# Patient Record
Sex: Male | Born: 1945 | Race: Black or African American | Hispanic: No | Smoking: Former smoker
Health system: Southern US, Community
[De-identification: ages and names within clinical notes are randomized; demographics above are authoritative.]

## PROBLEM LIST (undated history)

## (undated) DIAGNOSIS — E119 Type 2 diabetes mellitus without complications: Secondary | ICD-10-CM

## (undated) DIAGNOSIS — N289 Disorder of kidney and ureter, unspecified: Secondary | ICD-10-CM

## (undated) DIAGNOSIS — N186 End stage renal disease: Secondary | ICD-10-CM

## (undated) DIAGNOSIS — E559 Vitamin D deficiency, unspecified: Secondary | ICD-10-CM

## (undated) DIAGNOSIS — N529 Male erectile dysfunction, unspecified: Secondary | ICD-10-CM

## (undated) DIAGNOSIS — I10 Essential (primary) hypertension: Secondary | ICD-10-CM

## (undated) HISTORY — DX: Vitamin D deficiency, unspecified: E55.9

## (undated) HISTORY — PX: OTHER SURGICAL HISTORY: SHX169

## (undated) HISTORY — DX: Male erectile dysfunction, unspecified: N52.9

## (undated) HISTORY — PX: COLONOSCOPY: SHX174

## (undated) HISTORY — DX: End stage renal disease: N18.6

---

## 1998-02-02 ENCOUNTER — Other Ambulatory Visit: Admission: RE | Admit: 1998-02-02 | Discharge: 1998-02-02 | Payer: Self-pay | Admitting: Nephrology

## 1998-04-22 ENCOUNTER — Other Ambulatory Visit: Admission: RE | Admit: 1998-04-22 | Discharge: 1998-04-22 | Payer: Self-pay | Admitting: Nephrology

## 1998-08-28 ENCOUNTER — Emergency Department (HOSPITAL_COMMUNITY): Admission: EM | Admit: 1998-08-28 | Discharge: 1998-08-28 | Payer: Self-pay | Admitting: Emergency Medicine

## 1999-01-31 ENCOUNTER — Encounter: Payer: Self-pay | Admitting: *Deleted

## 1999-01-31 ENCOUNTER — Emergency Department (HOSPITAL_COMMUNITY): Admission: EM | Admit: 1999-01-31 | Discharge: 1999-01-31 | Payer: Self-pay | Admitting: *Deleted

## 1999-06-07 ENCOUNTER — Other Ambulatory Visit (HOSPITAL_COMMUNITY): Admission: RE | Admit: 1999-06-07 | Discharge: 1999-09-05 | Payer: Self-pay | Admitting: Psychiatry

## 2000-03-09 ENCOUNTER — Encounter: Admission: RE | Admit: 2000-03-09 | Discharge: 2000-03-09 | Payer: Self-pay | Admitting: Nephrology

## 2000-03-09 ENCOUNTER — Encounter: Payer: Self-pay | Admitting: Nephrology

## 2010-06-16 DIAGNOSIS — K0520 Aggressive periodontitis, unspecified: Secondary | ICD-10-CM | POA: Insufficient documentation

## 2011-08-30 DIAGNOSIS — B182 Chronic viral hepatitis C: Secondary | ICD-10-CM | POA: Insufficient documentation

## 2013-11-12 DIAGNOSIS — G629 Polyneuropathy, unspecified: Secondary | ICD-10-CM | POA: Insufficient documentation

## 2015-12-09 DIAGNOSIS — E113299 Type 2 diabetes mellitus with mild nonproliferative diabetic retinopathy without macular edema, unspecified eye: Secondary | ICD-10-CM | POA: Insufficient documentation

## 2016-02-10 DIAGNOSIS — N138 Other obstructive and reflux uropathy: Secondary | ICD-10-CM | POA: Insufficient documentation

## 2016-02-23 DIAGNOSIS — E119 Type 2 diabetes mellitus without complications: Secondary | ICD-10-CM

## 2016-02-23 DIAGNOSIS — I10 Essential (primary) hypertension: Secondary | ICD-10-CM | POA: Insufficient documentation

## 2016-02-23 DIAGNOSIS — R0789 Other chest pain: Secondary | ICD-10-CM | POA: Insufficient documentation

## 2016-02-24 DIAGNOSIS — J309 Allergic rhinitis, unspecified: Secondary | ICD-10-CM | POA: Insufficient documentation

## 2016-02-24 DIAGNOSIS — I119 Hypertensive heart disease without heart failure: Secondary | ICD-10-CM | POA: Insufficient documentation

## 2016-03-04 DIAGNOSIS — N5203 Combined arterial insufficiency and corporo-venous occlusive erectile dysfunction: Secondary | ICD-10-CM | POA: Insufficient documentation

## 2016-03-21 DIAGNOSIS — N37 Urethral disorders in diseases classified elsewhere: Secondary | ICD-10-CM | POA: Insufficient documentation

## 2016-07-07 DIAGNOSIS — E1122 Type 2 diabetes mellitus with diabetic chronic kidney disease: Secondary | ICD-10-CM | POA: Insufficient documentation

## 2016-07-07 DIAGNOSIS — Z91199 Patient's noncompliance with other medical treatment and regimen due to unspecified reason: Secondary | ICD-10-CM | POA: Insufficient documentation

## 2016-07-26 DIAGNOSIS — B009 Herpesviral infection, unspecified: Secondary | ICD-10-CM | POA: Insufficient documentation

## 2016-07-26 DIAGNOSIS — F1421 Cocaine dependence, in remission: Secondary | ICD-10-CM | POA: Insufficient documentation

## 2017-03-03 DIAGNOSIS — H5203 Hypermetropia, bilateral: Secondary | ICD-10-CM | POA: Insufficient documentation

## 2017-03-03 DIAGNOSIS — H26492 Other secondary cataract, left eye: Secondary | ICD-10-CM | POA: Insufficient documentation

## 2017-07-03 ENCOUNTER — Emergency Department (HOSPITAL_COMMUNITY)
Admission: EM | Admit: 2017-07-03 | Discharge: 2017-07-03 | Disposition: A | Discharge status: Home / Self Care | Payer: Medicare HMO | Attending: Emergency Medicine | Admitting: Emergency Medicine

## 2017-07-03 ENCOUNTER — Emergency Department (HOSPITAL_COMMUNITY): Payer: Medicare HMO

## 2017-07-03 ENCOUNTER — Telehealth: Payer: Self-pay | Admitting: Nurse Practitioner

## 2017-07-03 ENCOUNTER — Encounter (HOSPITAL_COMMUNITY): Payer: Self-pay | Admitting: Emergency Medicine

## 2017-07-03 DIAGNOSIS — R079 Chest pain, unspecified: Secondary | ICD-10-CM | POA: Insufficient documentation

## 2017-07-03 DIAGNOSIS — Z794 Long term (current) use of insulin: Secondary | ICD-10-CM | POA: Diagnosis not present

## 2017-07-03 DIAGNOSIS — R0789 Other chest pain: Secondary | ICD-10-CM | POA: Diagnosis not present

## 2017-07-03 DIAGNOSIS — I10 Essential (primary) hypertension: Secondary | ICD-10-CM | POA: Diagnosis not present

## 2017-07-03 DIAGNOSIS — Z79899 Other long term (current) drug therapy: Secondary | ICD-10-CM | POA: Insufficient documentation

## 2017-07-03 DIAGNOSIS — E119 Type 2 diabetes mellitus without complications: Secondary | ICD-10-CM | POA: Insufficient documentation

## 2017-07-03 DIAGNOSIS — Z7982 Long term (current) use of aspirin: Secondary | ICD-10-CM | POA: Insufficient documentation

## 2017-07-03 HISTORY — DX: Essential (primary) hypertension: I10

## 2017-07-03 HISTORY — DX: Type 2 diabetes mellitus without complications: E11.9

## 2017-07-03 LAB — CBC
HCT: 32.7 % — ABNORMAL LOW (ref 39.0–52.0)
HEMOGLOBIN: 11.3 g/dL — AB (ref 13.0–17.0)
MCH: 29.4 pg (ref 26.0–34.0)
MCHC: 34.6 g/dL (ref 30.0–36.0)
MCV: 85.2 fL (ref 78.0–100.0)
Platelets: 199 10*3/uL (ref 150–400)
RBC: 3.84 MIL/uL — ABNORMAL LOW (ref 4.22–5.81)
RDW: 13.5 % (ref 11.5–15.5)
WBC: 6.7 10*3/uL (ref 4.0–10.5)

## 2017-07-03 LAB — BASIC METABOLIC PANEL
ANION GAP: 8 (ref 5–15)
BUN: 41 mg/dL — AB (ref 6–20)
CHLORIDE: 108 mmol/L (ref 101–111)
CO2: 25 mmol/L (ref 22–32)
Calcium: 9 mg/dL (ref 8.9–10.3)
Creatinine, Ser: 2.13 mg/dL — ABNORMAL HIGH (ref 0.61–1.24)
GFR calc Af Amer: 35 mL/min — ABNORMAL LOW (ref >60)
GFR, EST NON AFRICAN AMERICAN: 30 mL/min — AB (ref >60)
GLUCOSE: 118 mg/dL — AB (ref 65–99)
POTASSIUM: 4.2 mmol/L (ref 3.5–5.1)
Sodium: 141 mmol/L (ref 135–145)

## 2017-07-03 LAB — I-STAT TROPONIN, ED
Troponin i, poc: 0.01 ng/mL (ref 0.00–0.08)
Troponin i, poc: 0 ng/mL (ref 0.00–0.08)

## 2017-07-03 LAB — D-DIMER, QUANTITATIVE: D-Dimer, Quant: 0.38 ug/mL-FEU (ref 0.00–0.50)

## 2017-07-03 LAB — CBG MONITORING, ED
GLUCOSE-CAPILLARY: 59 mg/dL — AB (ref 65–99)
GLUCOSE-CAPILLARY: 108 mg/dL — AB (ref 65–99)
Glucose-Capillary: 117 mg/dL — ABNORMAL HIGH (ref 65–99)

## 2017-07-03 IMAGING — CR DG CHEST 2V
2 series · 2 of 2 positions shown · non-contrast
Comparison: [DATE]

CLINICAL DATA: Chest pain

EXAM:
CHEST  2 VIEW

[chest pa]
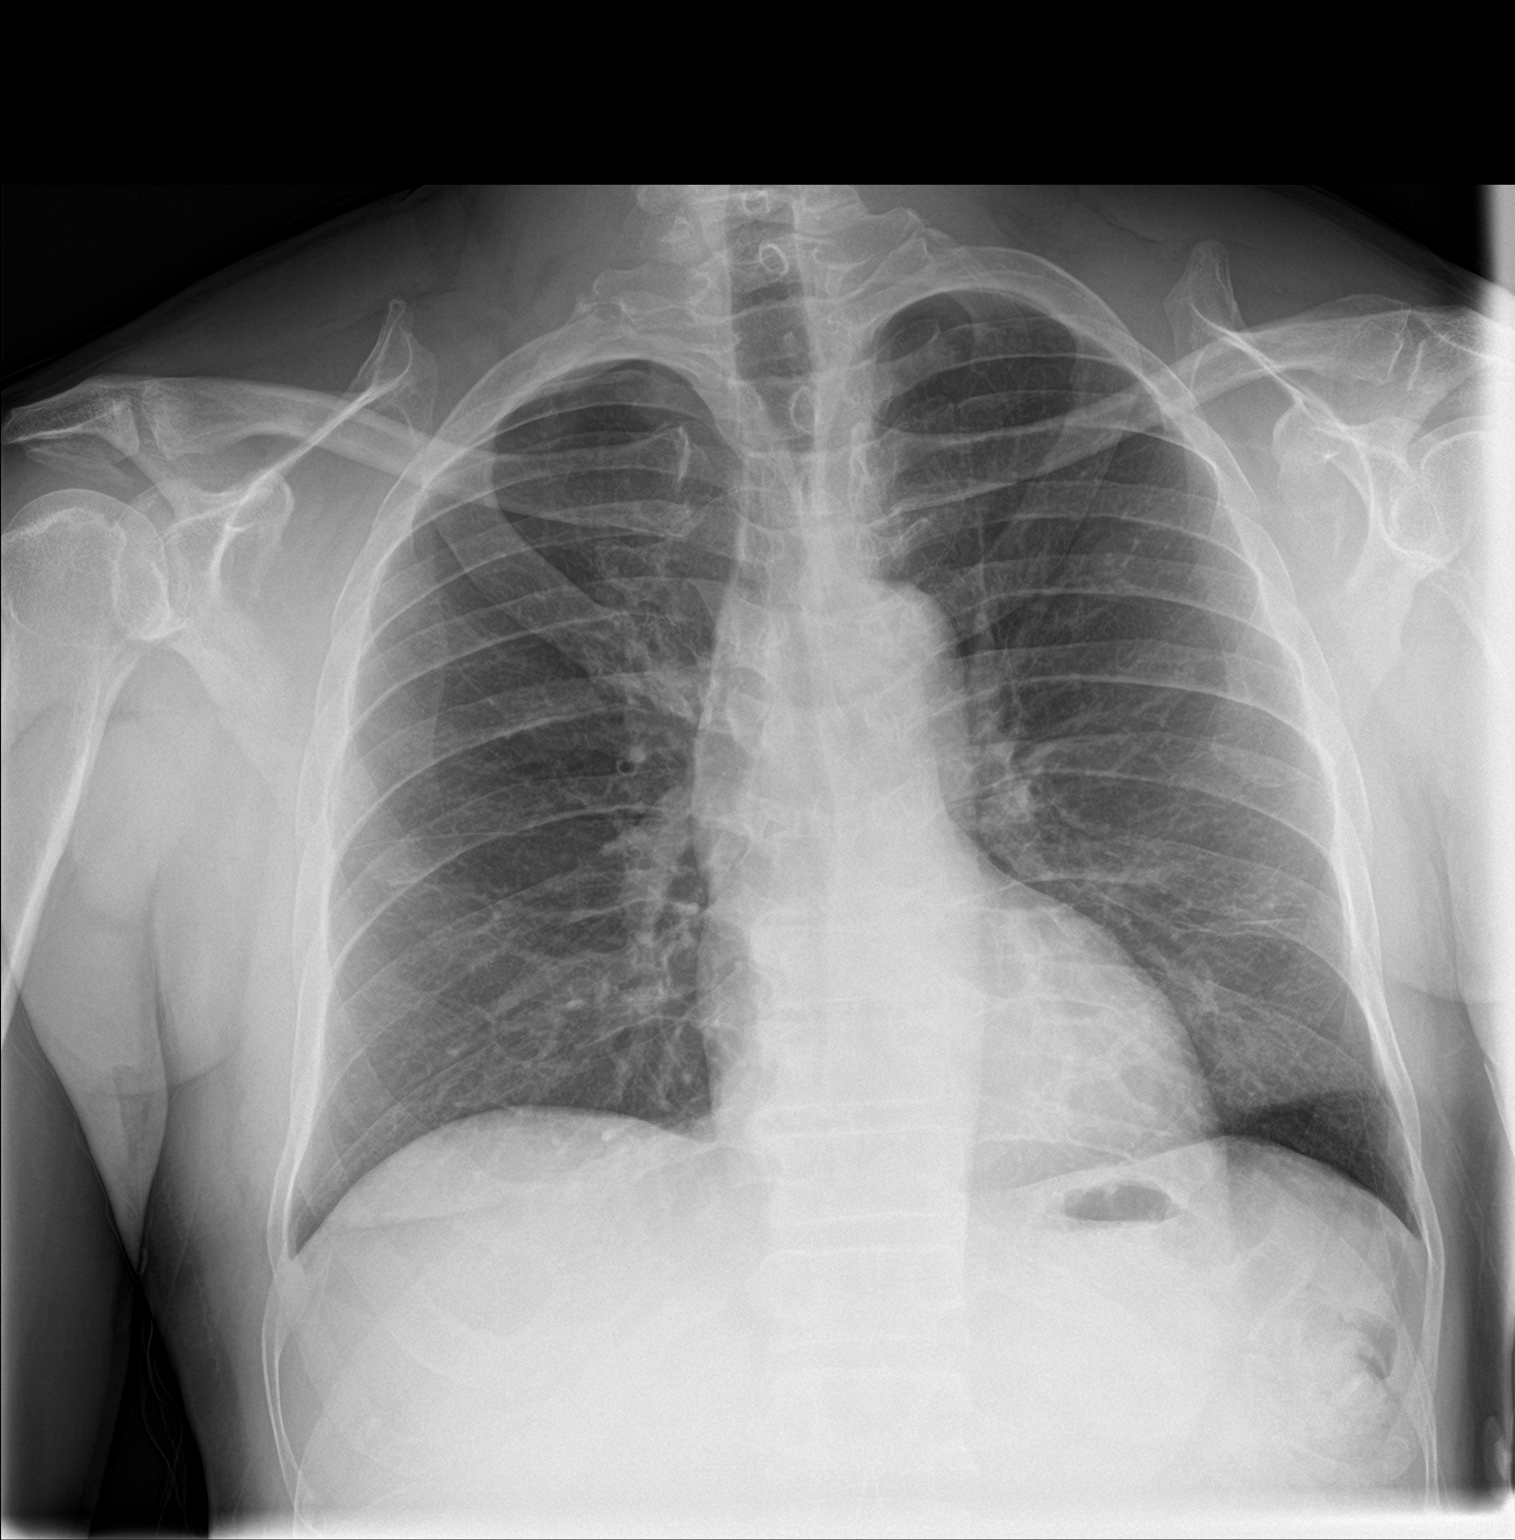

[chest lat]
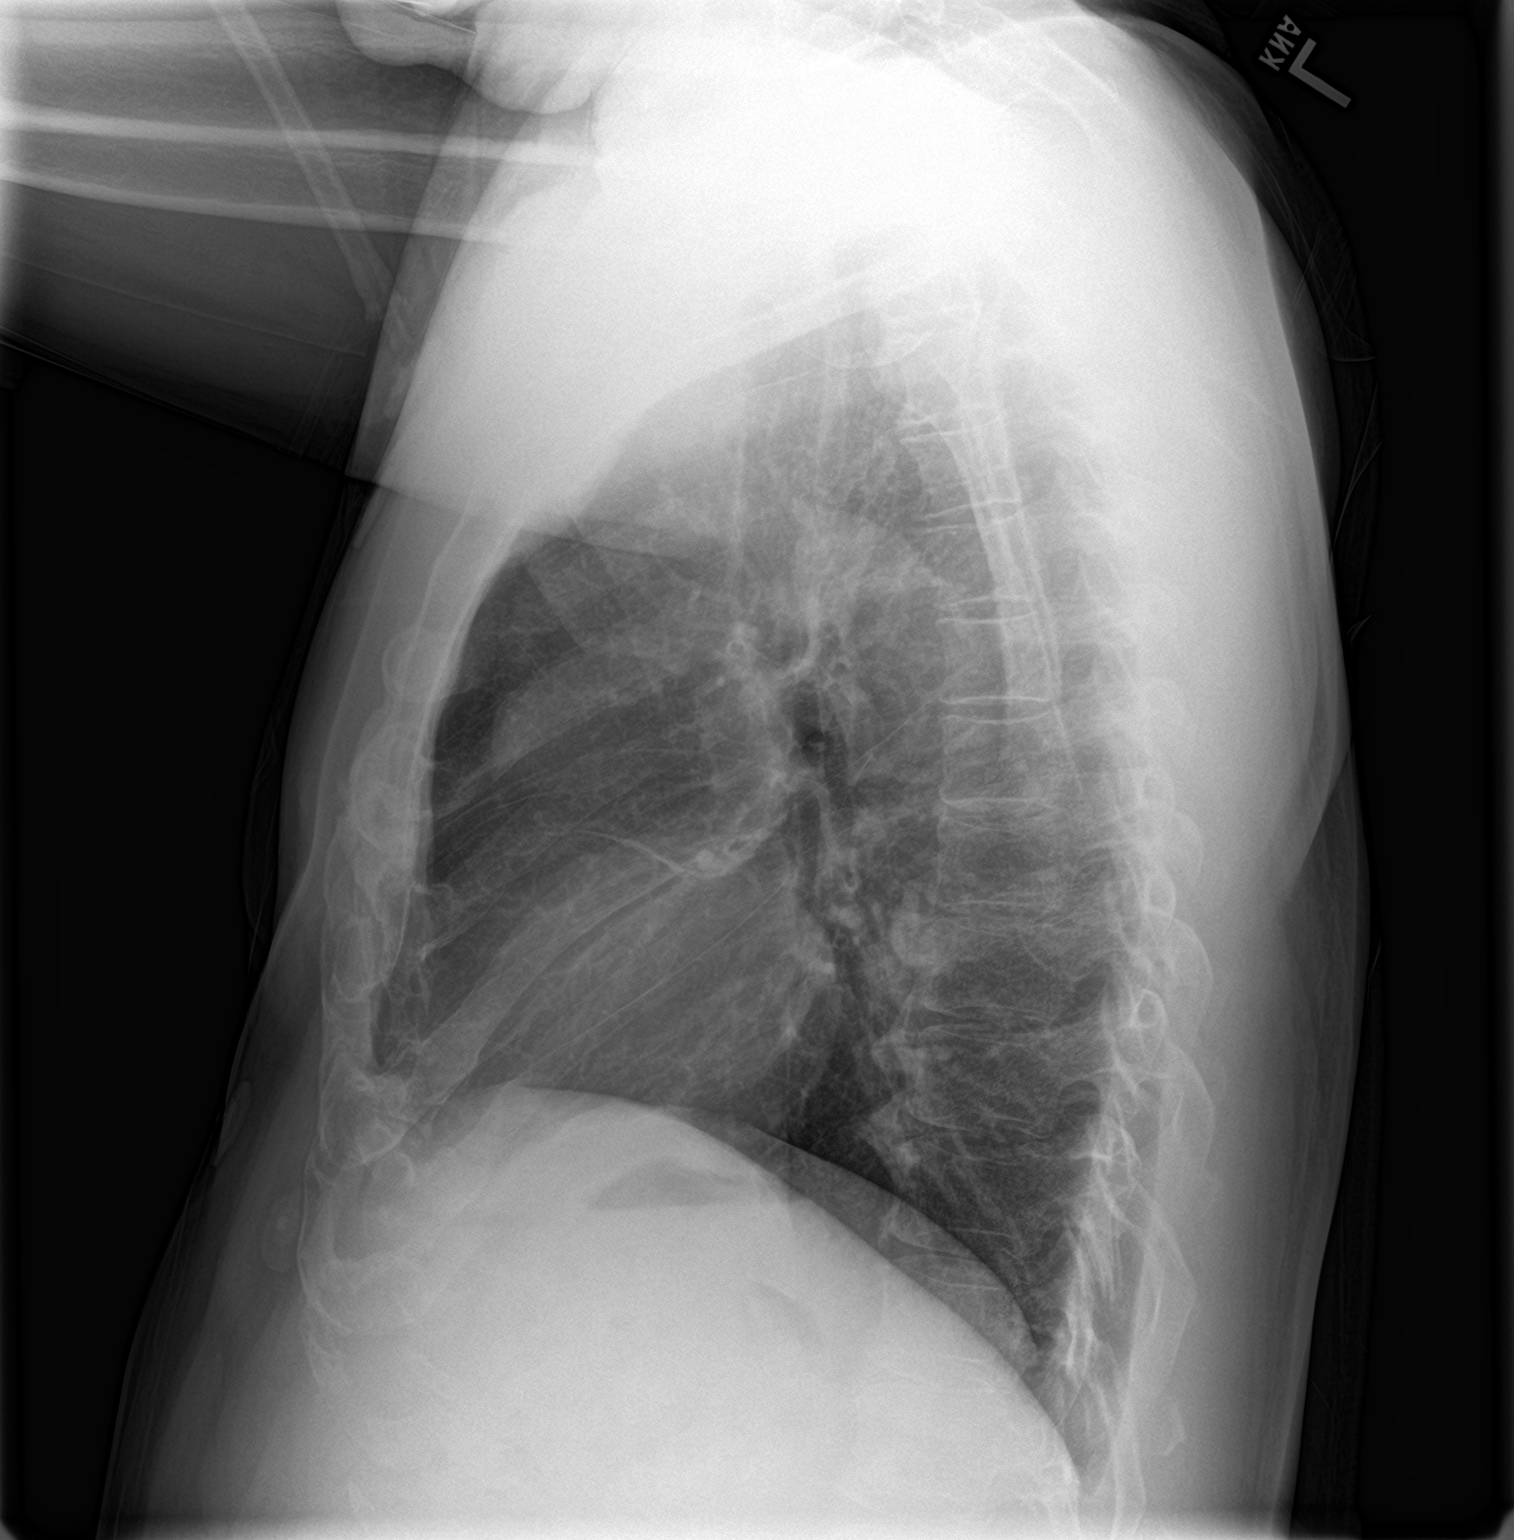

[2 of 2 positions shown; findings below may reference images not displayed]

FINDINGS: Heart and mediastinal contours are within normal limits. No focal
opacities or effusions. No acute bony abnormality.
IMPRESSION: No active cardiopulmonary disease.

## 2017-07-03 MED ORDER — ASPIRIN 81 MG PO CHEW
324.0000 mg | CHEWABLE_TABLET | Freq: Once | ORAL | Status: AC
  Administered 2017-07-03: 324 mg via ORAL
  Filled 2017-07-03: qty 4

## 2017-07-03 MED ORDER — NITROGLYCERIN 0.4 MG SL SUBL: 0.4000 mg | SUBLINGUAL_TABLET | SUBLINGUAL | 0 refills | Status: DC | PRN

## 2017-07-03 MED ORDER — NITROGLYCERIN 2 % TD OINT
1.0000 [in_us] | TOPICAL_OINTMENT | Freq: Four times a day (QID) | TRANSDERMAL | Status: DC
  Administered 2017-07-03: 1 [in_us] via TOPICAL
  Filled 2017-07-03: qty 1

## 2017-07-03 NOTE — ED Triage Notes (Signed)
Pt woke up to a "pulling or catching" on the left side of his chest.  He has never had this before, denied n/v/d/diaphoresis, lightheaded.

## 2017-07-03 NOTE — Telephone Encounter (Signed)
Order placed for lexiscan myoview and note sent to scheduling to arrange test date/time for patient.

## 2017-07-03 NOTE — ED Notes (Signed)
Got patient hooked up to the monitor patient is resting with call bell in reach

## 2017-07-03 NOTE — Consult Note (Addendum)
Cardiology Consultation:   Patient ID: Jonathan Kane; [id number]; [birth year]   Admit date: 07/03/2017 Date of Consult: 07/03/2017  Primary Care Provider: Myrtis Kane., MD Primary Cardiologist: new  Primary Electrophysiologist:  None    Patient Profile:   Jonathan Kane is a 71 y.o. male with a hx of Essential hypertension, CKD  and diabetes mellitus. who is being seen today for the evaluation of atypical chest pain at the request of Jonathan Kane.  History of Present Illness:   Jonathan Kane has not had any previous CP This am, around 1 am, Aching like sharp pain , like a toothache. Would last 5-8 seconds . Woke him up . Would come and go. Not pleuretic, not exertional .   Gets regular exercise in his yard.  No similar pains with yard work . DM is not well controlled.   [place]'s dr. Yvette [facility] [place] . Pain has resolved.  troponins are negative  D-dimer is negative   Non smoker  Family hx - mother had ovarian cancer, Sister has diabetes  Past Medical History:  Diagnosis Date  . Diabetes mellitus without complication ([place])   . Hypertension     History reviewed. No pertinent surgical history.   Home Medications:  Prior to Admission medications   Medication Sig Start Date End Date Taking? Authorizing Provider  ACCU-CHEK AVIVA PLUS test strip 1 each by Other route as needed.  04/06/17  Yes [provider]  ACCU-CHEK SOFTCLIX LANCETS lancets CHECK BLOOD SUGAR TWO TO THREE TIMES DAILY 04/05/17  Yes [provider]  Alcohol Swabs (B-D SINGLE USE SWABS REGULAR) PADS USE TO CHECK BLOOD SUGAR AS DIRECTED 01/26/17  Yes [provider]  amLODipine (NORVASC) 10 MG tablet Take 10 mg by mouth daily.    Yes [provider]  aspirin EC 81 MG tablet Take 81 mg by mouth daily.   Yes [provider]  atorvastatin (LIPITOR) 40 MG tablet Take 40 mg by mouth daily at 6 PM.  05/09/17  Yes [provider]  B Complex Vitamins (VITAMIN B  COMPLEX PO) Take 1 capsule by mouth daily.    Yes [provider]  Blood Glucose Monitoring Suppl (GLUCOCOM BLOOD GLUCOSE MONITOR) DEVI Use as directed.  Dx Code: U31.497 10/26/16  Yes [provider]  Exenatide ER (BYDUREON) 2 MG PEN Inject 2 mg as directed once a week. Wednesday 11/11/16  Yes [provider]  FERREX 150 150 MG capsule Take 150 mg by mouth daily.  05/25/17  Yes [provider]  ibuprofen (ADVIL,MOTRIN) 200 MG tablet Take 200 mg by mouth daily as needed for mild pain.   Yes [provider]  insulin aspart (NOVOLOG) 100 UNIT/ML FlexPen 4-12 units per sliding scale for lunch and supper. 09/13/16  Yes [provider]  insulin aspart protamine - aspart (NOVOLOG 70/30 MIX) (70-30) 100 UNIT/ML FlexPen Inject 25-35 Units into the skin 2 (two) times daily. 25UNITS IN THE MORNING AND 35UNITS AT BEDTIME 04/28/17  Yes [provider]  Insulin Pen Needle (FIFTY50 PEN NEEDLES) 31G X 8 MM MISC Use as directed per sliding scale.  Dx Code: W26.378 02/03/16  Yes [provider]  lisinopril (PRINIVIL,ZESTRIL) 5 MG tablet Take 5 mg by mouth daily.  04/06/17  Yes [provider]  pioglitazone (ACTOS) 30 MG tablet Take 30 mg by mouth daily.    Yes [provider]  PREVNAR 13 SUSP injection Inject 0.5 mLs into the muscle once.  04/28/17  Yes [provider]    Inpatient Medications: Scheduled Meds: . nitroGLYCERIN  1 inch Topical Q6H   Continuous Infusions:  PRN Meds:   Allergies:    Allergies  Allergen Reactions  . Dulaglutide     TRULICITY:  GI Upset intolerance    Social History:   Social History   Social History  . Marital status: Divorced    Spouse name: N/A  . Number of children: N/A  . Years of education: N/A   Occupational History  . Not on file.   Social History Main Topics  . Smoking status: Never Smoker  . Smokeless tobacco: Never Used  . Alcohol use Yes     Comment: 6pack can  last me 2-3 weeks  . Drug use: No  . Sexual activity: Not on file   Other Topics Concern  . Not on file   Social History Narrative  . No narrative on file    Family History:   Family History  Problem Relation Age of Onset  . Ovarian cancer Mother   . Diabetes Sister      ROS:  Please see the history of present illness.  ROS  All other ROS reviewed and negative.     Physical Exam/Data:   Vitals:   07/03/17 0700 07/03/17 0701 07/03/17 1000  BP: 123/83  (!) 163/93  Pulse: 80  70  Resp: 18  15  Temp: 97.8 F (36.6 C)    TempSrc: Oral    SpO2: 100%  100%  Weight:  178 lb (80.7 kg)   Height:  5\' 10"  (1.778 m)    No intake or output data in the 24 hours ending 07/03/17 1133 Filed Weights   07/03/17 0701  Weight: 178 lb (80.7 kg)   Body mass index is 25.54 kg/m.  General:  Well nourished, well developed, in no acute distress HEENT: normal Lymph: no adenopathy Neck: no JVD Endocrine:  No thryomegaly Vascular: No carotid bruits; FA pulses 2+ bilaterally without bruits  Cardiac:  normal S1, S2; RRR; no murmur  Lungs:  clear to auscultation bilaterally, no wheezing, rhonchi or rales  Abd: soft, nontender, no hepatomegaly  Ext: no edema Musculoskeletal:  No deformities, BUE and BLE strength normal and equal Skin: warm and dry  Neuro:  CNs 2-12 intact, no focal abnormalities noted Psych:  Normal affect   EKG:  The EKG was personally reviewed and demonstrates:   NSR at 86.  No ST or T wave change  Telemetry:  Telemetry was personally reviewed and demonstrates:  nsr  Relevant CV Studies:   Laboratory Data:  Chemistry  Recent Labs Lab 07/03/17 0723  NA 141  K 4.2  CL 108  CO2 25  GLUCOSE 118*  BUN 41*  CREATININE 2.13*  CALCIUM 9.0  GFRNONAA 30*  GFRAA 35*  ANIONGAP 8    No results for input(s): PROT, ALBUMIN, AST, ALT, ALKPHOS, BILITOT in the last 168 hours. Hematology  Recent Labs Lab 07/03/17 0723  WBC 6.7  RBC 3.84*  HGB 11.3*  HCT 32.7*    MCV 85.2  MCH 29.4  MCHC 34.6  RDW 13.5  PLT 199   Cardiac EnzymesNo results for input(s): TROPONINI in the last 168 hours.   Recent Labs Lab 07/03/17 0720 07/03/17 1009  TROPIPOC 0.01 0.00    BNPNo results for input(s): BNP, PROBNP in the last 168 hours.  DDimer   Recent Labs Lab 07/03/17 0951  DDIMER 0.38    Radiology/Studies:  Dg Chest 2 View  Result Date: 07/03/2017 CLINICAL DATA:  Chest pain EXAM: CHEST  2 VIEW COMPARISON:  02/23/2016 FINDINGS: Heart and mediastinal contours are within normal limits. No focal opacities or effusions. No acute bony abnormality. IMPRESSION: No active cardiopulmonary disease. Electronically Signed   By: Rolm Baptise M.D.   On: 07/03/2017 07:22    Assessment and Plan:   1. CP , atypical.   rec an ASA 81 a day ( hes on this already)  \ Pains are very atypical,  Lasting only for few seconds  D-dimer is WNL   Work up as OP including stress myoview   Further evaluation depending on the OP myoview \\2 .  Essential HTN contiinue amlodipine and lisinopril     For questions or updates, please contact [place] Please consult [email or web address] for contact info under Cardiology/STEMI.   Signed, Mertie Moores, MD  07/03/2017 11:33 AM

## 2017-07-03 NOTE — Discharge Instructions (Signed)
Take an asa daily.  The NTG prescriptions is to be used if you have any recurrent chest pain.  Return to the ED as needed for worsening symptoms

## 2017-07-03 NOTE — ED Provider Notes (Signed)
[facility] DEPT Provider Note   CSN: [id number] Arrival date & time: 07/03/17  8099     History   Chief Complaint Chief Complaint  Patient presents with  . Chest Pain    HPI Jonathan Kane is a 71 y.o. male.  HPI Pt woke up this am with a pulling sensation on the left chest.  Sx started a little after 1 am.  It lasted for 5 seconds or so.  It will come and go.  Every 10 minutes it come back.  He continues to feel the sx.  He tries to exercise or take a deep breath but then it will come back suddenly.   No nausea, vomiting, shortness of breath.  Pt has DM and HTN.  No known heart disease.  No prior cardiac workup. Past Medical History:  Diagnosis Date  . Diabetes mellitus without complication ([place])   . Hypertension     There are no active problems to display for this patient.   History reviewed. No pertinent surgical history.     Home Medications    Prior to Admission medications   Medication Sig Start Date End Date Taking? Authorizing Provider  ACCU-CHEK AVIVA PLUS test strip 1 each by Other route as needed.  04/06/17  Yes [provider]  ACCU-CHEK SOFTCLIX LANCETS lancets CHECK BLOOD SUGAR TWO TO THREE TIMES DAILY 04/05/17  Yes [provider]  Alcohol Swabs (B-D SINGLE USE SWABS REGULAR) PADS USE TO CHECK BLOOD SUGAR AS DIRECTED 01/26/17  Yes [provider]  amLODipine (NORVASC) 10 MG tablet Take 10 mg by mouth daily.    Yes [provider]  aspirin EC 81 MG tablet Take 81 mg by mouth daily.   Yes [provider]  atorvastatin (LIPITOR) 40 MG tablet Take 40 mg by mouth daily at 6 PM.  05/09/17  Yes [provider]  B Complex Vitamins (VITAMIN B COMPLEX PO) Take 1 capsule by mouth daily.    Yes [provider]  Blood Glucose Monitoring Suppl (GLUCOCOM BLOOD GLUCOSE MONITOR) DEVI Use as directed.  Dx Code: I33.825 10/26/16  Yes [provider]  Exenatide ER (BYDUREON) 2 MG PEN Inject 2 mg as  directed once a week. Wednesday 11/11/16  Yes [provider]  FERREX 150 150 MG capsule Take 150 mg by mouth daily.  05/25/17  Yes [provider]  ibuprofen (ADVIL,MOTRIN) 200 MG tablet Take 200 mg by mouth daily as needed for mild pain.   Yes [provider]  insulin aspart (NOVOLOG) 100 UNIT/ML FlexPen 4-12 units per sliding scale for lunch and supper. 09/13/16  Yes [provider]  insulin aspart protamine - aspart (NOVOLOG 70/30 MIX) (70-30) 100 UNIT/ML FlexPen Inject 25-35 Units into the skin 2 (two) times daily. 25UNITS IN THE MORNING AND 35UNITS AT BEDTIME 04/28/17  Yes [provider]  Insulin Pen Needle (FIFTY50 PEN NEEDLES) 31G X 8 MM MISC Use as directed per sliding scale.  Dx Code: K53.976 02/03/16  Yes [provider]  lisinopril (PRINIVIL,ZESTRIL) 5 MG tablet Take 5 mg by mouth daily.  04/06/17  Yes [provider]  pioglitazone (ACTOS) 30 MG tablet Take 30 mg by mouth daily.    Yes [provider]  PREVNAR 13 SUSP injection Inject 0.5 mLs into the muscle once.  04/28/17  Yes [provider]  nitroGLYCERIN (NITROSTAT) 0.4 MG SL tablet Place 1 tablet (0.4 mg total) under the tongue every 5 (five) minutes as needed for chest pain. 07/03/17  Dorie Rank, MD    Family History Family History  Problem Relation Age of Onset  . Ovarian cancer Mother   . Diabetes Sister     Social History Social History  Substance Use Topics  . Smoking status: Never Smoker  . Smokeless tobacco: Never Used  . Alcohol use Yes     Comment: 6pack can last me 2-3 weeks     Allergies   Dulaglutide   Review of Systems Review of Systems  All other systems reviewed and are negative.    Physical Exam Updated Vital Signs BP (!) 152/105 (BP Location: Right Arm)   Pulse 76   Temp 97.8 F (36.6 C) (Oral)   Resp 20   Ht 1.778 m (5\' 10" )   Wt 80.7 kg (178 lb)   SpO2 100%   BMI 25.54 kg/m   Physical Exam    Constitutional: He appears well-developed and well-nourished. No distress.  HENT:  Head: Normocephalic and atraumatic.  Right Ear: External ear normal.  Left Ear: External ear normal.  Eyes: Conjunctivae are normal. Right eye exhibits no discharge. Left eye exhibits no discharge. No scleral icterus.  Neck: Neck supple. No tracheal deviation present.  Cardiovascular: Normal rate, regular rhythm and intact distal pulses.   Pulmonary/Chest: Effort normal and breath sounds normal. No stridor. No respiratory distress. He has no wheezes. He has no rales.  Abdominal: Soft. Bowel sounds are normal. He exhibits no distension. There is no tenderness. There is no rebound and no guarding.  Musculoskeletal: He exhibits no edema or tenderness.  Neurological: He is alert. He has normal strength. No cranial nerve deficit (no facial droop, extraocular movements intact, no slurred speech) or sensory deficit. He exhibits normal muscle tone. He displays no seizure activity. Coordination normal.  Skin: Skin is warm and dry. No rash noted.  Psychiatric: He has a normal mood and affect.  Nursing note and vitals reviewed.    ED Treatments / Results  Labs (all labs ordered are listed, but only abnormal results are displayed) Labs Reviewed  BASIC METABOLIC PANEL - Abnormal; Notable for the following:       Result Value   Glucose, Bld 118 (*)    BUN 41 (*)    Creatinine, Ser 2.13 (*)    GFR calc non Af Amer 30 (*)    GFR calc Af Amer 35 (*)    All other components within normal limits  CBC - Abnormal; Notable for the following:    RBC 3.84 (*)    Hemoglobin 11.3 (*)    HCT 32.7 (*)    All other components within normal limits  CBG MONITORING, ED - Abnormal; Notable for the following:    Glucose-Capillary 117 (*)    All other components within normal limits  CBG MONITORING, ED - Abnormal; Notable for the following:    Glucose-Capillary 59 (*)    All other components within normal limits  D-DIMER,  QUANTITATIVE (NOT AT [facility])  I-STAT TROPONIN, ED  I-STAT TROPONIN, ED    EKG  EKG Interpretation  Date/Time:  Monday July 03 2017 06:38:29 EDT Ventricular Rate:  86 PR Interval:  142 QRS Duration: 80 QT Interval:  386 QTC Calculation: 461 R Axis:   76 Text Interpretation:  Normal sinus rhythm Nonspecific T wave abnormality Prolonged QT Abnormal ECG No old tracing to compare Confirmed by Dorie Rank [phone]) on 07/03/2017 9:43:18 AM       Radiology Dg Chest 2 View  Result Date: 07/03/2017 CLINICAL DATA:  Chest pain EXAM: CHEST  2 VIEW COMPARISON:  02/23/2016 FINDINGS: Heart and mediastinal contours are within normal limits. No focal opacities or effusions. No acute bony abnormality. IMPRESSION: No active cardiopulmonary disease. Electronically Signed   By: Rolm Baptise M.D.   On: 07/03/2017 07:22    Procedures Procedures (including critical care time)  Medications Ordered in ED Medications  nitroGLYCERIN (NITROGLYN) 2 % ointment 1 inch (1 inch Topical Given 07/03/17 1025)  aspirin chewable tablet 324 mg (324 mg Oral Given 07/03/17 1023)     Initial Impression / Assessment and Plan / ED Course  I have reviewed the triage vital signs and the nursing notes.  Pertinent labs & imaging results that were available during my care of the patient were reviewed by me and considered in my medical decision making (see chart for details).  Clinical Course as of Jul 03 1200  Mon Jul 03, 2017  1017 Cr 2.10 Oct 2016 per medical records  [JK]  1037 Heart score 5.  Moderate risk  [JK]    Clinical Course User Index [JK] Dorie Rank, MD    Patient presented to the emergency room with atypical sounding chest pain. Patient does have cardiac risk factors however and this  gives hima moderate risk heart score.  I consult with cardiology, and Dr. Lynder Parents was kind enough to evaluate the patient in the emergency room.  The patient would prefer to go home if possible.  Symptoms are felt to be  atypical for cardiac disease. Patient will have an outpatient stress test arranged. Patient is stable for discharge  Final Clinical Impressions(s) / ED Diagnoses   Final diagnoses:  Chest pain, unspecified type    New Prescriptions New Prescriptions   NITROGLYCERIN (NITROSTAT) 0.4 MG SL TABLET    Place 1 tablet (0.4 mg total) under the tongue every 5 (five) minutes as needed for chest pain.     Dorie Rank, MD 07/03/17 [phone]

## 2017-07-03 NOTE — ED Notes (Signed)
Checked patient blood sugar it was 59 notifed Doctor, hospital

## 2017-07-03 NOTE — ED Notes (Signed)
Notified Dr. Tomi Bamberger of patients blood sugar of 59.  Pt alert and oriented.  Provided diet coke and peanut butter and crackers.

## 2017-07-03 NOTE — Telephone Encounter (Signed)
-----   Message from Thayer Headings, MD sent at 07/03/2017 11:40 AM EDT ----- Hello I saw Jonathan Kane in the ER with atypical CP Will you call ( or arrange for a scheduler to call ) and arrange a stress myoview  Thanks  Abbe Amsterdam

## 2017-07-04 ENCOUNTER — Telehealth (HOSPITAL_COMMUNITY): Payer: Self-pay | Admitting: Cardiovascular Disease

## 2017-07-04 NOTE — Telephone Encounter (Signed)
User: Cherie Dark A Date/time: 07/04/17 11:23 AM  Comment: Called pt and lmsg for him to CB to get scheduled for a myoview.   Context:  Outcome: Left Message  Phone number: [phone] Phone Type: Home Phone  Comm. type: Telephone Call type: Outgoing  Contact: Mayer Masker Relation to patient: Self

## 2017-07-10 ENCOUNTER — Telehealth (HOSPITAL_COMMUNITY): Payer: Self-pay | Admitting: *Deleted

## 2017-07-10 NOTE — Telephone Encounter (Signed)
Left message on voicemail in reference to upcoming appointment scheduled for 07/12/17. Phone number given for a call back so details instructions can be given.  Kirstie Peri

## 2017-07-11 ENCOUNTER — Telehealth (HOSPITAL_COMMUNITY): Payer: Self-pay | Admitting: *Deleted

## 2017-07-11 NOTE — Telephone Encounter (Signed)
Left message on voicemail in reference to upcoming appointment scheduled for 07/12/17. Phone number given for a call back so details instructions can be given. Mackynzie Woolford, Ranae Palms

## 2017-07-12 ENCOUNTER — Encounter (HOSPITAL_COMMUNITY): Payer: Medicare HMO

## 2017-08-09 ENCOUNTER — Encounter (HOSPITAL_COMMUNITY): Payer: Medicare HMO

## 2017-08-16 DIAGNOSIS — D631 Anemia in chronic kidney disease: Secondary | ICD-10-CM | POA: Diagnosis present

## 2017-08-16 DIAGNOSIS — E162 Hypoglycemia, unspecified: Secondary | ICD-10-CM | POA: Insufficient documentation

## 2017-08-21 ENCOUNTER — Encounter (HOSPITAL_COMMUNITY): Payer: Medicare HMO

## 2017-08-22 ENCOUNTER — Encounter (HOSPITAL_COMMUNITY): Payer: Medicare HMO

## 2017-08-29 DIAGNOSIS — N521 Erectile dysfunction due to diseases classified elsewhere: Secondary | ICD-10-CM | POA: Insufficient documentation

## 2017-09-01 ENCOUNTER — Telehealth (HOSPITAL_COMMUNITY): Payer: Self-pay | Admitting: Cardiovascular Disease

## 2017-09-01 NOTE — Telephone Encounter (Signed)
Patient has no-showed for appts on 9/26,11/6 and has cancelled an appt on 10/26.  He will be removed from the workqueue.

## 2017-11-10 DIAGNOSIS — T280XXA Burn of mouth and pharynx, initial encounter: Secondary | ICD-10-CM | POA: Insufficient documentation

## 2017-11-10 DIAGNOSIS — E559 Vitamin D deficiency, unspecified: Secondary | ICD-10-CM | POA: Insufficient documentation

## 2017-11-30 DIAGNOSIS — N185 Chronic kidney disease, stage 5: Secondary | ICD-10-CM | POA: Insufficient documentation

## 2017-11-30 DIAGNOSIS — R809 Proteinuria, unspecified: Secondary | ICD-10-CM | POA: Insufficient documentation

## 2018-01-02 DIAGNOSIS — H401131 Primary open-angle glaucoma, bilateral, mild stage: Secondary | ICD-10-CM | POA: Insufficient documentation

## 2018-08-23 DIAGNOSIS — N5089 Other specified disorders of the male genital organs: Secondary | ICD-10-CM | POA: Insufficient documentation

## 2018-09-14 DIAGNOSIS — R93811 Abnormal radiologic findings on diagnostic imaging of right testicle: Secondary | ICD-10-CM | POA: Insufficient documentation

## 2018-09-14 DIAGNOSIS — N451 Epididymitis: Secondary | ICD-10-CM | POA: Insufficient documentation

## 2018-10-04 DIAGNOSIS — N179 Acute kidney failure, unspecified: Secondary | ICD-10-CM | POA: Insufficient documentation

## 2019-01-17 DIAGNOSIS — H524 Presbyopia: Secondary | ICD-10-CM | POA: Insufficient documentation

## 2019-01-17 DIAGNOSIS — H43813 Vitreous degeneration, bilateral: Secondary | ICD-10-CM | POA: Insufficient documentation

## 2019-01-17 DIAGNOSIS — Z961 Presence of intraocular lens: Secondary | ICD-10-CM | POA: Insufficient documentation

## 2019-02-14 DIAGNOSIS — E1129 Type 2 diabetes mellitus with other diabetic kidney complication: Secondary | ICD-10-CM | POA: Insufficient documentation

## 2019-05-29 DIAGNOSIS — L03032 Cellulitis of left toe: Secondary | ICD-10-CM | POA: Insufficient documentation

## 2019-08-01 DIAGNOSIS — N184 Chronic kidney disease, stage 4 (severe): Secondary | ICD-10-CM | POA: Insufficient documentation

## 2019-09-02 DIAGNOSIS — N185 Chronic kidney disease, stage 5: Secondary | ICD-10-CM | POA: Insufficient documentation

## 2019-11-07 ENCOUNTER — Observation Stay (HOSPITAL_COMMUNITY)
Admission: EM | Admit: 2019-11-07 | Discharge: 2019-11-08 | Disposition: A | Discharge status: Home / Self Care | Payer: Medicare Other | Attending: Student in an Organized Health Care Education/Training Program | Admitting: Student in an Organized Health Care Education/Training Program

## 2019-11-07 ENCOUNTER — Other Ambulatory Visit (HOSPITAL_COMMUNITY): Payer: Medicare Other

## 2019-11-07 ENCOUNTER — Emergency Department (HOSPITAL_COMMUNITY): Payer: Medicare Other

## 2019-11-07 ENCOUNTER — Other Ambulatory Visit: Payer: Self-pay

## 2019-11-07 ENCOUNTER — Inpatient Hospital Stay (HOSPITAL_COMMUNITY): Payer: Medicare Other

## 2019-11-07 ENCOUNTER — Encounter (HOSPITAL_COMMUNITY): Payer: Self-pay | Admitting: *Deleted

## 2019-11-07 DIAGNOSIS — Z794 Long term (current) use of insulin: Secondary | ICD-10-CM | POA: Insufficient documentation

## 2019-11-07 DIAGNOSIS — D631 Anemia in chronic kidney disease: Secondary | ICD-10-CM | POA: Diagnosis not present

## 2019-11-07 DIAGNOSIS — Z7982 Long term (current) use of aspirin: Secondary | ICD-10-CM | POA: Insufficient documentation

## 2019-11-07 DIAGNOSIS — Z833 Family history of diabetes mellitus: Secondary | ICD-10-CM | POA: Insufficient documentation

## 2019-11-07 DIAGNOSIS — G9341 Metabolic encephalopathy: Principal | ICD-10-CM | POA: Insufficient documentation

## 2019-11-07 DIAGNOSIS — N179 Acute kidney failure, unspecified: Secondary | ICD-10-CM | POA: Insufficient documentation

## 2019-11-07 DIAGNOSIS — R443 Hallucinations, unspecified: Secondary | ICD-10-CM | POA: Insufficient documentation

## 2019-11-07 DIAGNOSIS — E1142 Type 2 diabetes mellitus with diabetic polyneuropathy: Secondary | ICD-10-CM | POA: Diagnosis not present

## 2019-11-07 DIAGNOSIS — N2581 Secondary hyperparathyroidism of renal origin: Secondary | ICD-10-CM | POA: Diagnosis not present

## 2019-11-07 DIAGNOSIS — E1165 Type 2 diabetes mellitus with hyperglycemia: Secondary | ICD-10-CM | POA: Diagnosis not present

## 2019-11-07 DIAGNOSIS — Z888 Allergy status to other drugs, medicaments and biological substances status: Secondary | ICD-10-CM | POA: Insufficient documentation

## 2019-11-07 DIAGNOSIS — R4182 Altered mental status, unspecified: Secondary | ICD-10-CM | POA: Insufficient documentation

## 2019-11-07 DIAGNOSIS — Z791 Long term (current) use of non-steroidal anti-inflammatories (NSAID): Secondary | ICD-10-CM | POA: Insufficient documentation

## 2019-11-07 DIAGNOSIS — Z87891 Personal history of nicotine dependence: Secondary | ICD-10-CM | POA: Insufficient documentation

## 2019-11-07 DIAGNOSIS — N401 Enlarged prostate with lower urinary tract symptoms: Secondary | ICD-10-CM | POA: Diagnosis not present

## 2019-11-07 DIAGNOSIS — N189 Chronic kidney disease, unspecified: Secondary | ICD-10-CM | POA: Diagnosis present

## 2019-11-07 DIAGNOSIS — I6782 Cerebral ischemia: Secondary | ICD-10-CM | POA: Diagnosis not present

## 2019-11-07 DIAGNOSIS — E1122 Type 2 diabetes mellitus with diabetic chronic kidney disease: Secondary | ICD-10-CM | POA: Diagnosis not present

## 2019-11-07 DIAGNOSIS — Z79899 Other long term (current) drug therapy: Secondary | ICD-10-CM | POA: Insufficient documentation

## 2019-11-07 DIAGNOSIS — I129 Hypertensive chronic kidney disease with stage 1 through stage 4 chronic kidney disease, or unspecified chronic kidney disease: Secondary | ICD-10-CM | POA: Insufficient documentation

## 2019-11-07 DIAGNOSIS — N184 Chronic kidney disease, stage 4 (severe): Secondary | ICD-10-CM | POA: Insufficient documentation

## 2019-11-07 DIAGNOSIS — E119 Type 2 diabetes mellitus without complications: Secondary | ICD-10-CM

## 2019-11-07 DIAGNOSIS — Z20822 Contact with and (suspected) exposure to covid-19: Secondary | ICD-10-CM | POA: Insufficient documentation

## 2019-11-07 DIAGNOSIS — E1169 Type 2 diabetes mellitus with other specified complication: Secondary | ICD-10-CM | POA: Diagnosis not present

## 2019-11-07 DIAGNOSIS — Z8041 Family history of malignant neoplasm of ovary: Secondary | ICD-10-CM | POA: Insufficient documentation

## 2019-11-07 DIAGNOSIS — E559 Vitamin D deficiency, unspecified: Secondary | ICD-10-CM | POA: Insufficient documentation

## 2019-11-07 DIAGNOSIS — K5900 Constipation, unspecified: Secondary | ICD-10-CM | POA: Insufficient documentation

## 2019-11-07 HISTORY — DX: Disorder of kidney and ureter, unspecified: N28.9

## 2019-11-07 LAB — COMPREHENSIVE METABOLIC PANEL
ALT: 16 U/L (ref 0–44)
AST: 14 U/L — ABNORMAL LOW (ref 15–41)
Albumin: 3.3 g/dL — ABNORMAL LOW (ref 3.5–5.0)
Alkaline Phosphatase: 65 U/L (ref 38–126)
Anion gap: 14 (ref 5–15)
BUN: 64 mg/dL — ABNORMAL HIGH (ref 8–23)
CO2: 20 mmol/L — ABNORMAL LOW (ref 22–32)
Calcium: 8.6 mg/dL — ABNORMAL LOW (ref 8.9–10.3)
Chloride: 108 mmol/L (ref 98–111)
Creatinine, Ser: 6.75 mg/dL — ABNORMAL HIGH (ref 0.61–1.24)
GFR calc Af Amer: 9 mL/min — ABNORMAL LOW (ref >60)
GFR calc non Af Amer: 7 mL/min — ABNORMAL LOW (ref >60)
Glucose, Bld: 152 mg/dL — ABNORMAL HIGH (ref 70–99)
Potassium: 3.8 mmol/L (ref 3.5–5.1)
Sodium: 142 mmol/L (ref 135–145)
Total Bilirubin: 0.3 mg/dL (ref 0.3–1.2)
Total Protein: 6.9 g/dL (ref 6.5–8.1)

## 2019-11-07 LAB — URINALYSIS, ROUTINE W REFLEX MICROSCOPIC
Bilirubin Urine: NEGATIVE
Glucose, UA: >=500 mg/dL — AB
Ketones, ur: NEGATIVE mg/dL
Leukocytes,Ua: NEGATIVE
Nitrite: NEGATIVE
Protein, ur: >=300 mg/dL — AB
Specific Gravity, Urine: 1.011 (ref 1.005–1.030)
pH: 5 (ref 5.0–8.0)

## 2019-11-07 LAB — CBC WITH DIFFERENTIAL/PLATELET
Abs Immature Granulocytes: 0.02 10*3/uL (ref 0.00–0.07)
Basophils Absolute: 0.1 10*3/uL (ref 0.0–0.1)
Basophils Relative: 1 %
Eosinophils Absolute: 0.1 10*3/uL (ref 0.0–0.5)
Eosinophils Relative: 1 %
HCT: 28.5 % — ABNORMAL LOW (ref 39.0–52.0)
Hemoglobin: 9.6 g/dL — ABNORMAL LOW (ref 13.0–17.0)
Immature Granulocytes: 0 %
Lymphocytes Relative: 10 %
Lymphs Abs: 0.8 10*3/uL (ref 0.7–4.0)
MCH: 28.9 pg (ref 26.0–34.0)
MCHC: 33.7 g/dL (ref 30.0–36.0)
MCV: 85.8 fL (ref 80.0–100.0)
Monocytes Absolute: 0.6 10*3/uL (ref 0.1–1.0)
Monocytes Relative: 7 %
Neutro Abs: 6.3 10*3/uL (ref 1.7–7.7)
Neutrophils Relative %: 81 %
Platelets: 182 10*3/uL (ref 150–400)
RBC: 3.32 MIL/uL — ABNORMAL LOW (ref 4.22–5.81)
RDW: 12.6 % (ref 11.5–15.5)
WBC: 7.8 10*3/uL (ref 4.0–10.5)
nRBC: 0 % (ref 0.0–0.2)

## 2019-11-07 LAB — HEMOGLOBIN A1C
Hgb A1c MFr Bld: 10.1 % — ABNORMAL HIGH (ref 4.8–5.6)
Mean Plasma Glucose: 243.17 mg/dL

## 2019-11-07 LAB — GLUCOSE, CAPILLARY
Glucose-Capillary: 320 mg/dL — ABNORMAL HIGH (ref 70–99)
Glucose-Capillary: 358 mg/dL — ABNORMAL HIGH (ref 70–99)

## 2019-11-07 LAB — CBG MONITORING, ED
Glucose-Capillary: 111 mg/dL — ABNORMAL HIGH (ref 70–99)
Glucose-Capillary: 172 mg/dL — ABNORMAL HIGH (ref 70–99)

## 2019-11-07 LAB — LIPASE, BLOOD: Lipase: 55 U/L — ABNORMAL HIGH (ref 11–51)

## 2019-11-07 LAB — LACTIC ACID, PLASMA: Lactic Acid, Venous: 1.1 mmol/L (ref 0.5–1.9)

## 2019-11-07 LAB — SARS CORONAVIRUS 2 (TAT 6-24 HRS): SARS Coronavirus 2: NEGATIVE

## 2019-11-07 IMAGING — US US RENAL
1 series · 14 of 25 positions shown · non-contrast
Comparison: Ultrasound [DATE]

CLINICAL DATA: Acute renal failure

EXAM:
RENAL / URINARY TRACT ULTRASOUND COMPLETE

[Series 1: us renal · 14 of 43 slices shown]
[im 1/43]
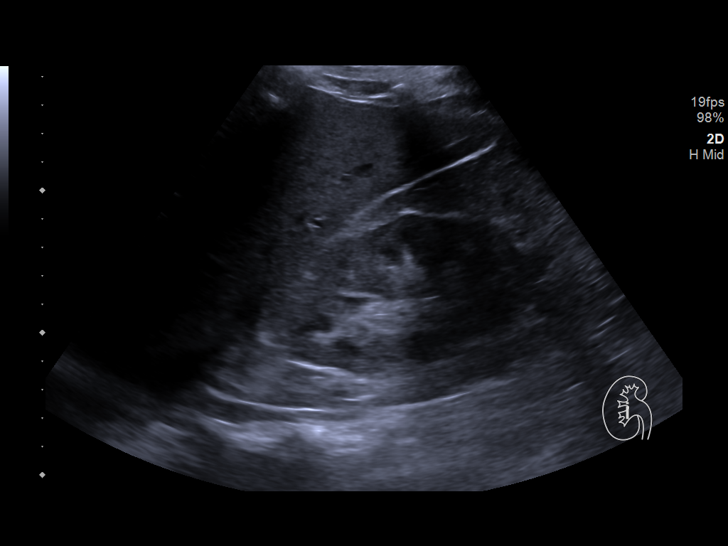
[im 4/43]
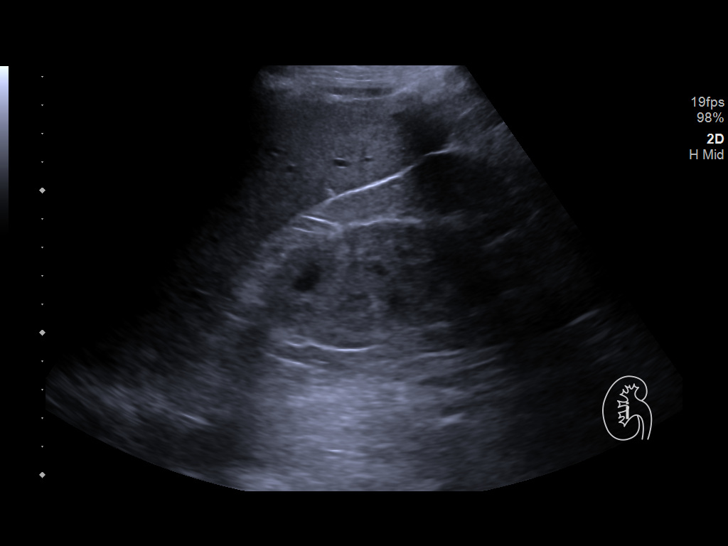
[im 8/43]
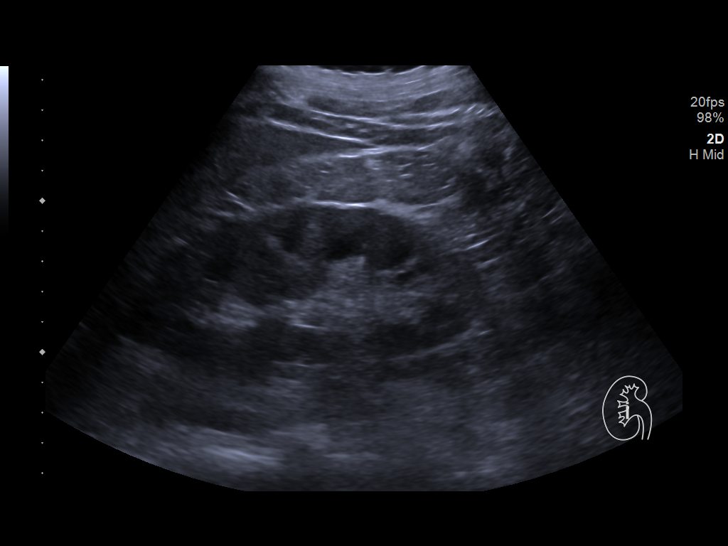
[im 11/43]
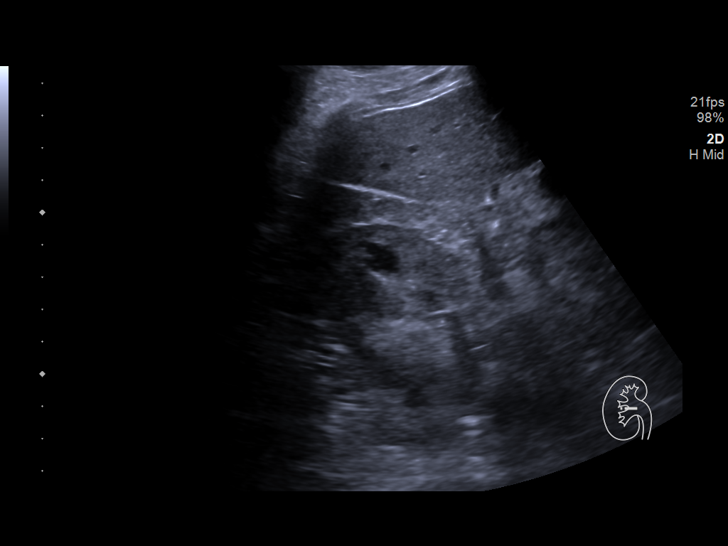
[im 15/43]
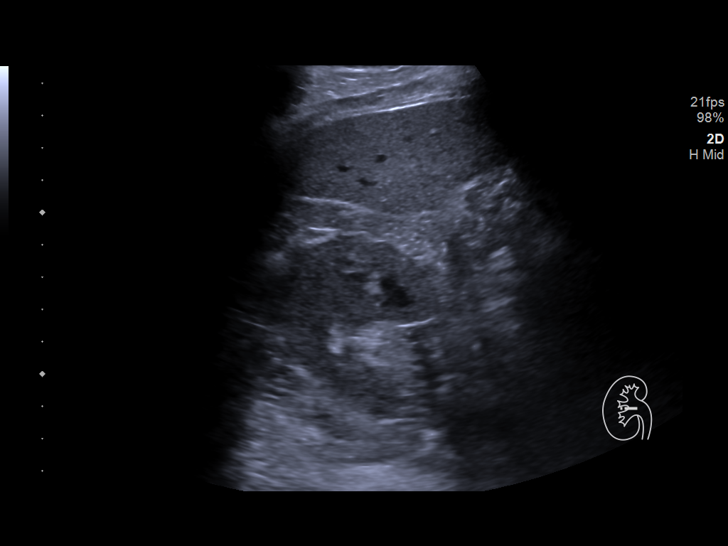
[im 16/43]
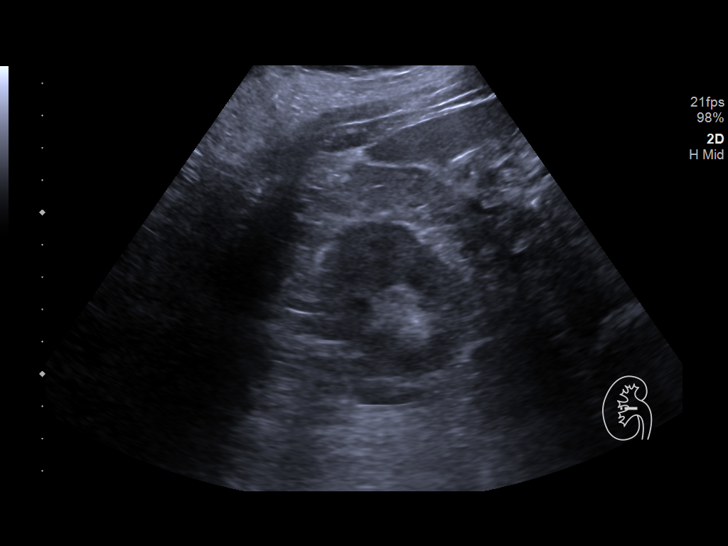
[im 20/43]
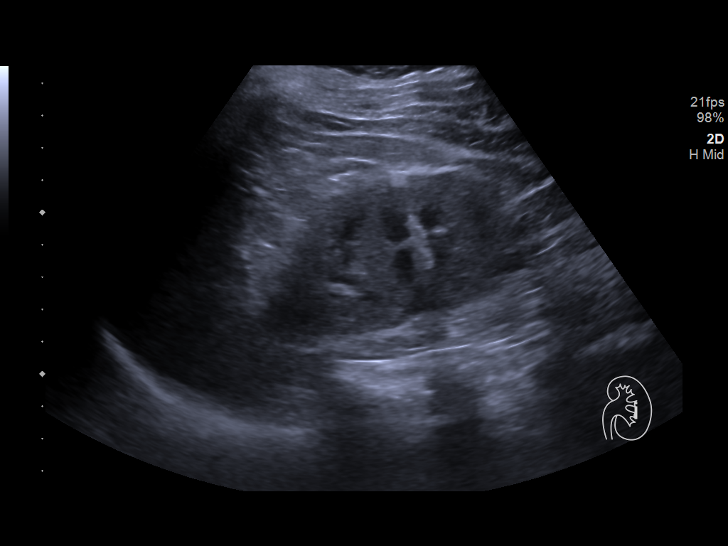
[im 23/43]
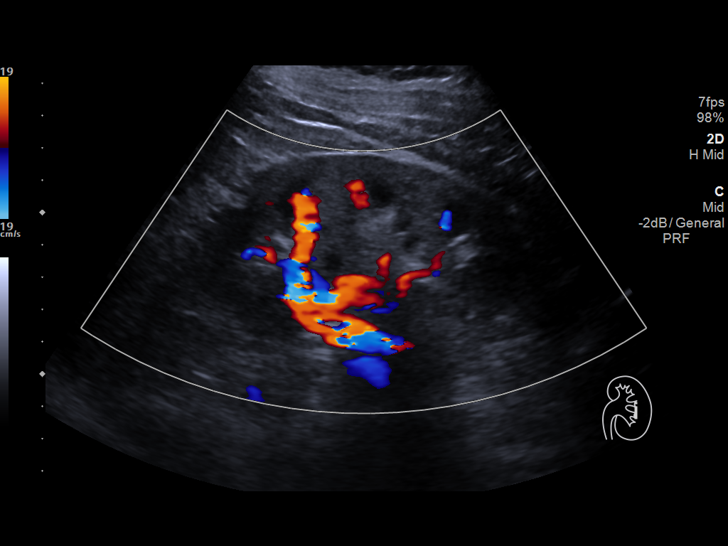
[im 27/43]
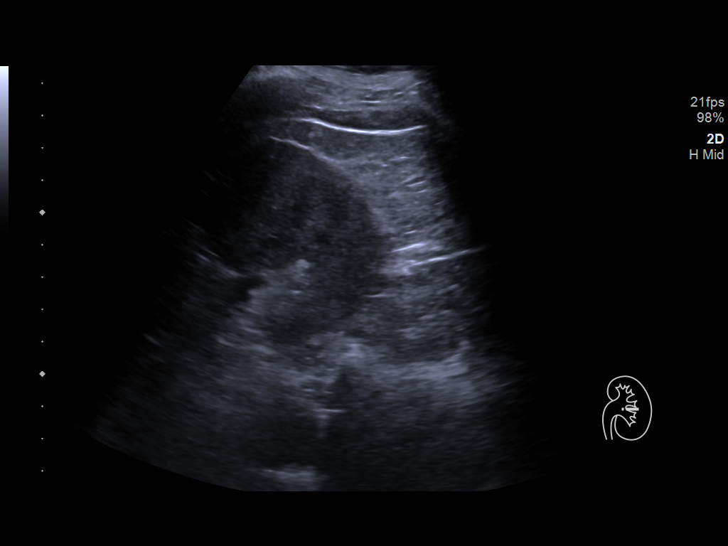
[im 29/43]
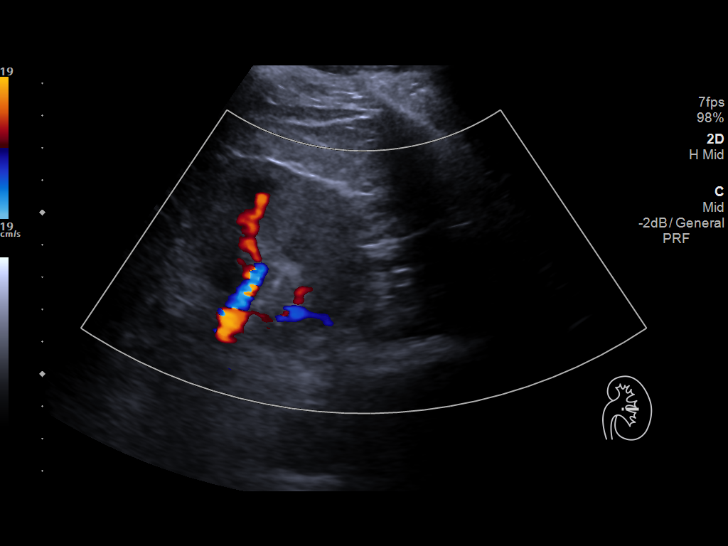
[im 32/43]
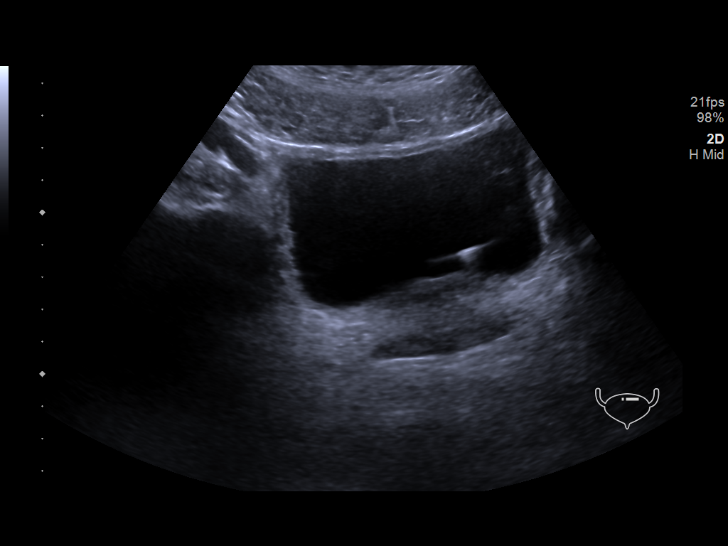
[im 36/43]
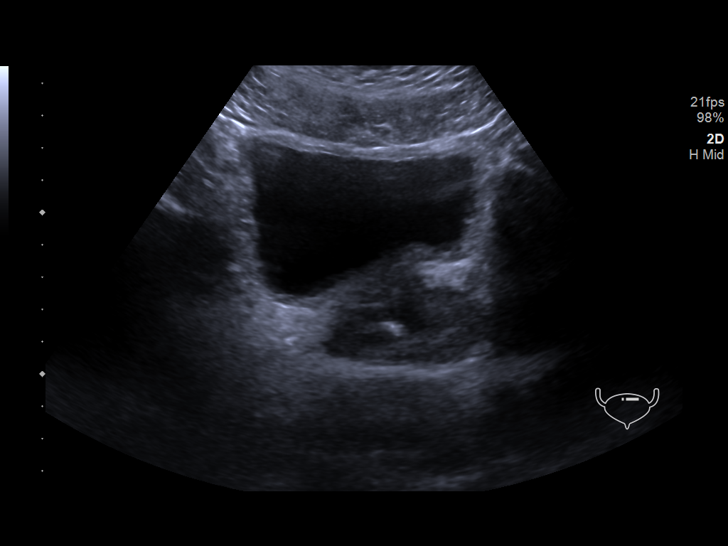
[im 39/43]
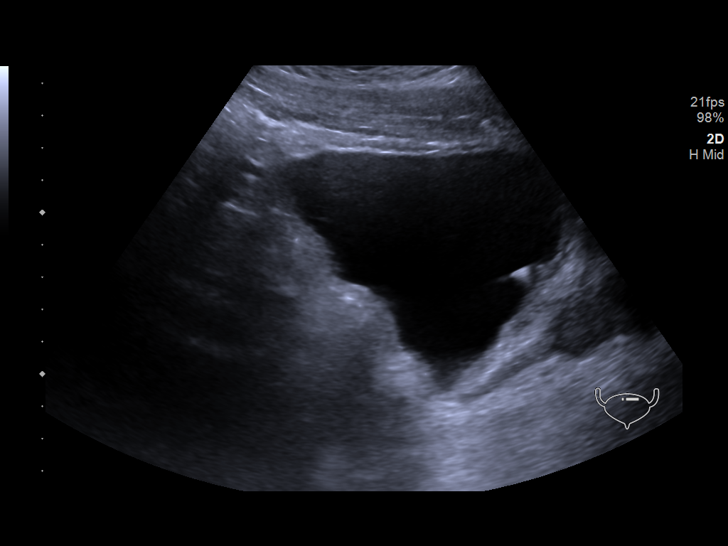
[im 43/43]
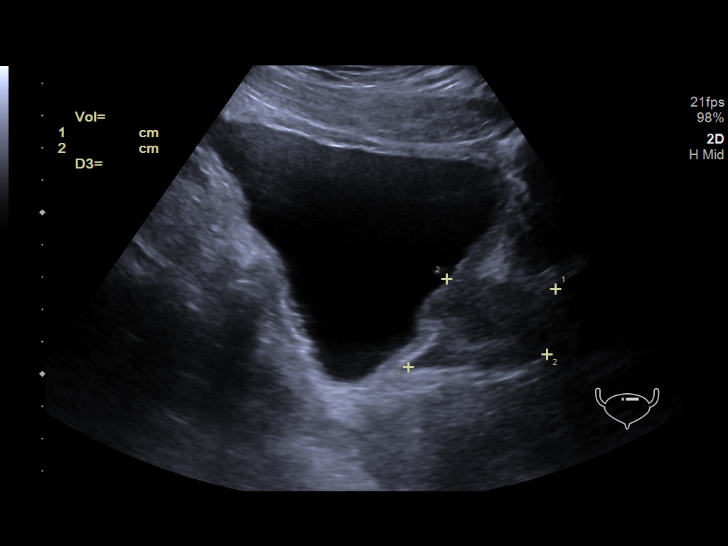

[14 of 25 positions shown; findings below may reference images not displayed]

FINDINGS: Right Kidney:

Renal measurements: 12.3 x 5.3 x 5.4 cm = volume: 183.7 mL .
Echogenicity within normal limits. No mass or hydronephrosis
visualized.

Left Kidney:

Renal measurements: 11.1 x 6.1 x 5.1 cm = volume: 180 mL.
Echogenicity within normal limits. No mass or hydronephrosis
visualized.

Bladder:

Echogenic focus within the left posterior bladder.

Other:

Enlarged prostate with volume of 58.6 mL.  Prostate calcification.
IMPRESSION: 1. Normal ultrasound appearance of the kidneys.
2. 9 mm echogenic focus within the left aspect of the urinary
bladder, possibly representing a small stone.
3. Enlarged prostate

## 2019-11-07 IMAGING — CT CT HEAD W/O CM
4 series · 16 of 47 positions shown, 18 images · non-contrast
Comparison: None.

CLINICAL DATA: Altered mental status.

EXAM:
CT HEAD WITHOUT CONTRAST
TECHNIQUE: Contiguous axial images were obtained from the base of the skull
through the vertex without intravenous contrast.

[Series 3: head without · axial · non-contrast · 0.44mm/px · z∈[-102,+18]mm · 7 of 33 slices shown, 9 images]
[im 5/33  brain]
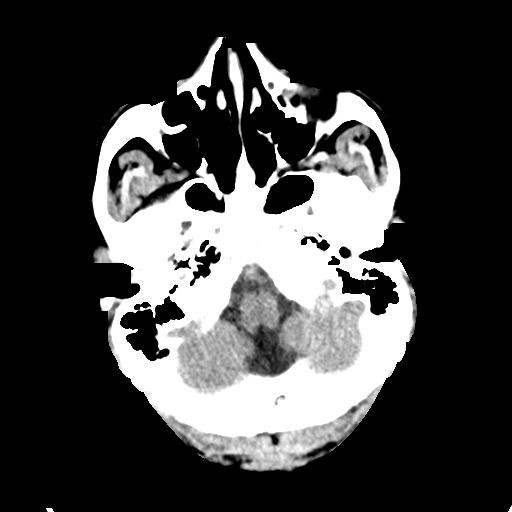
[im 5/33  bone]
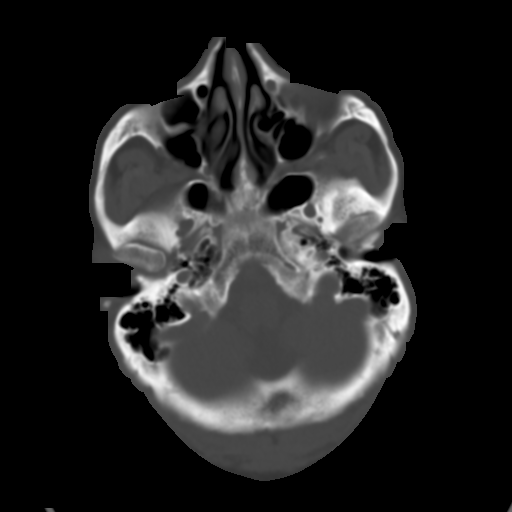
[im 9/33  brain]
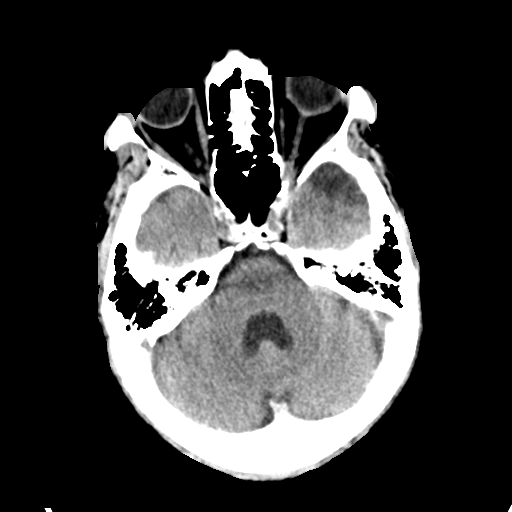
[im 13/33  brain]
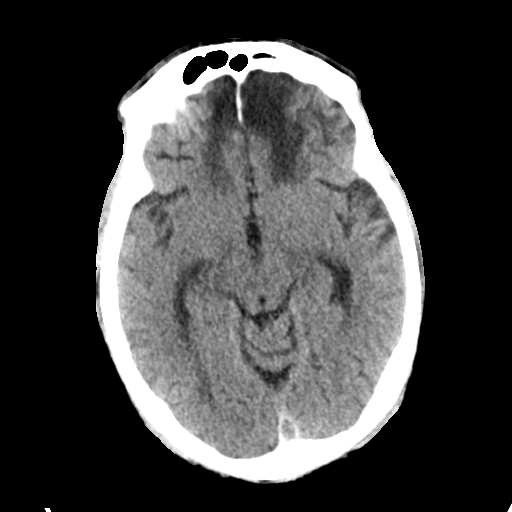
[im 17/33  brain]
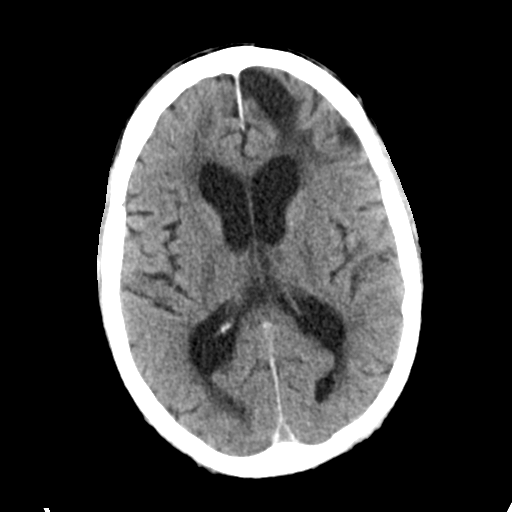
[im 21/33  brain]
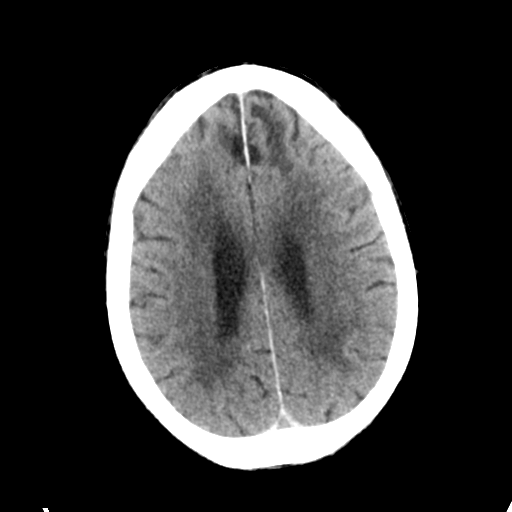
[im 21/33  bone]
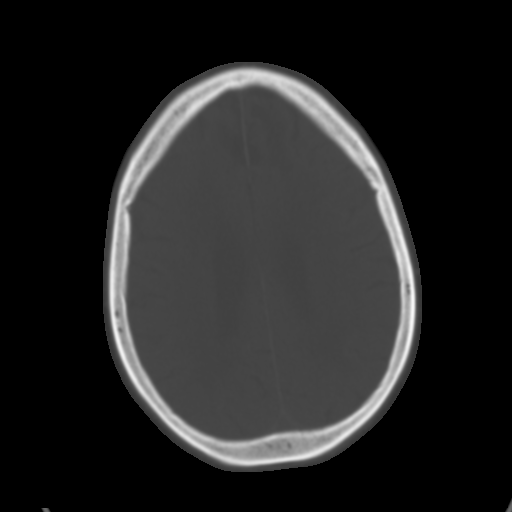
[im 25/33  brain]
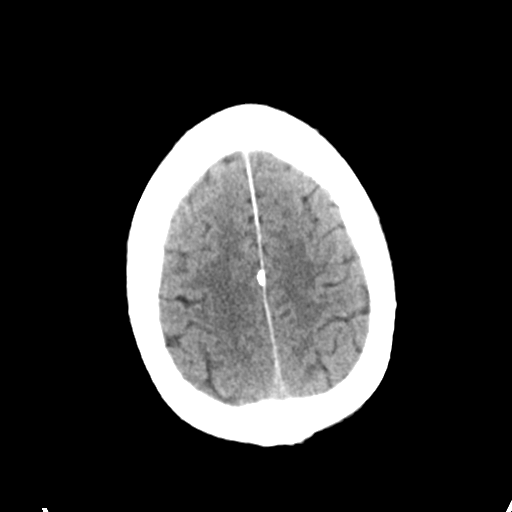
[im 29/33  brain]
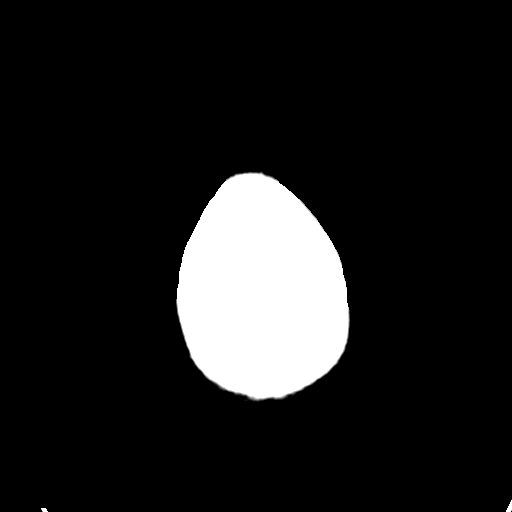

[Series 4: head bone · axial · 0.44mm/px · z∈[-106,-74]mm · 3 of 83 slices shown]
[im 9/83  bone]
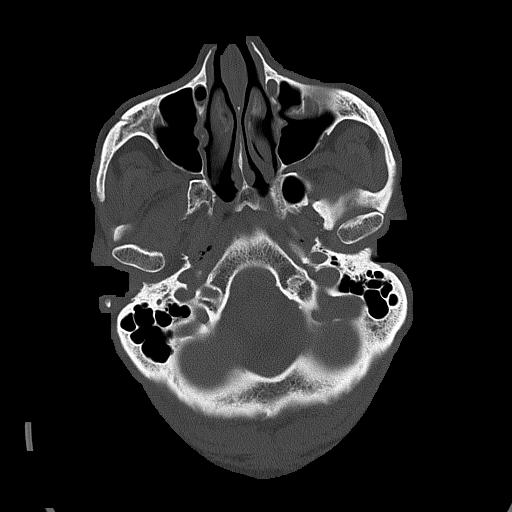
[im 17/83  bone]
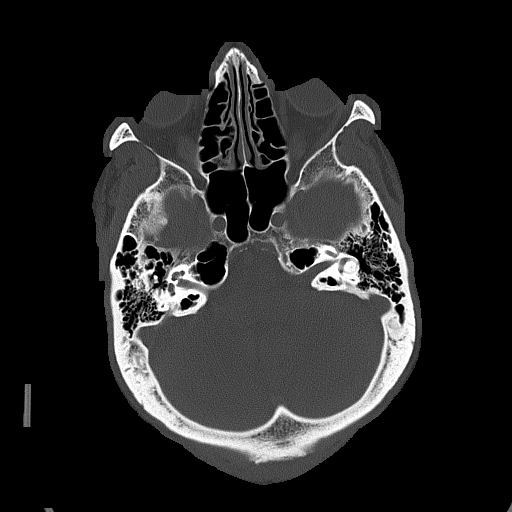
[im 25/83  bone]
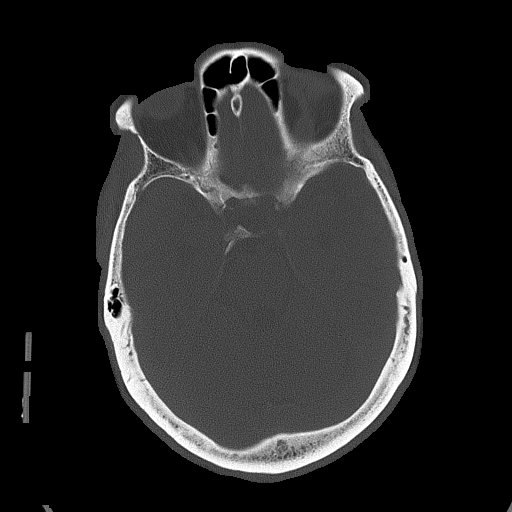

[Series 5: head without cor · coronal · non-contrast · 0.31mm/px · 3 of 74 slices shown]
[im 25/74  brain]
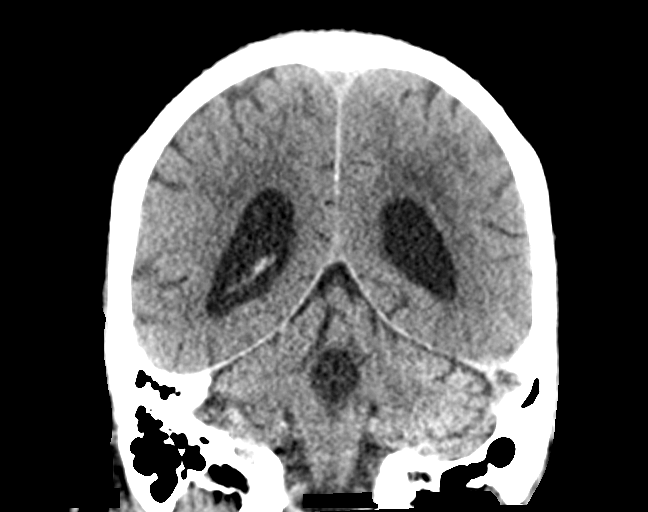
[im 33/74  brain]
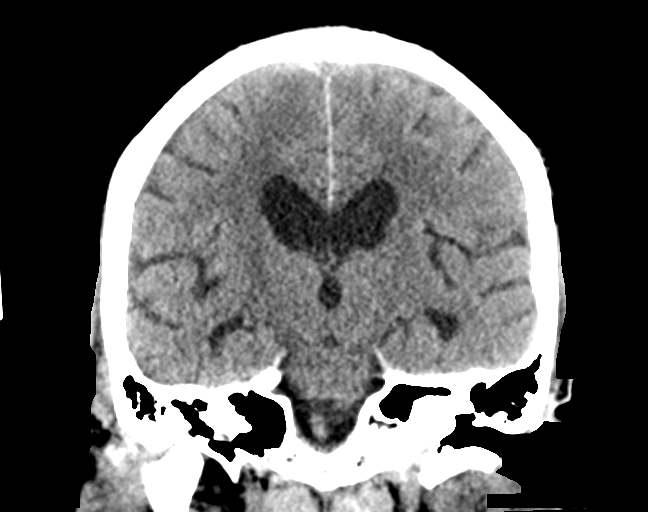
[im 41/74  brain]
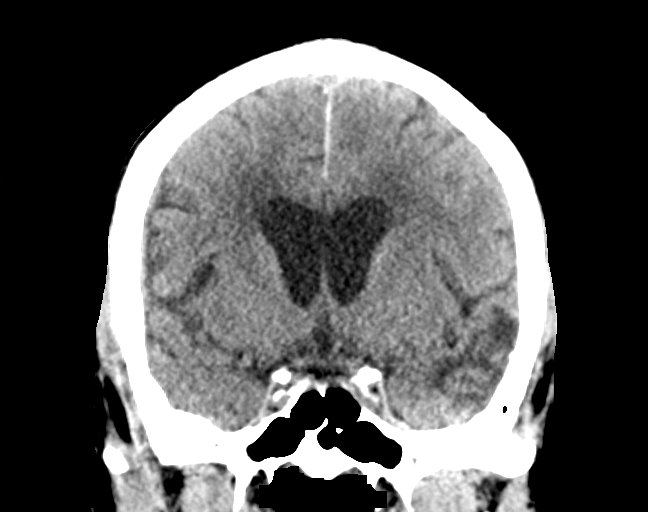

[Series 6: head without sag · sagittal · non-contrast · 0.33mm/px · 3 of 56 slices shown]
[im 19/56  brain]
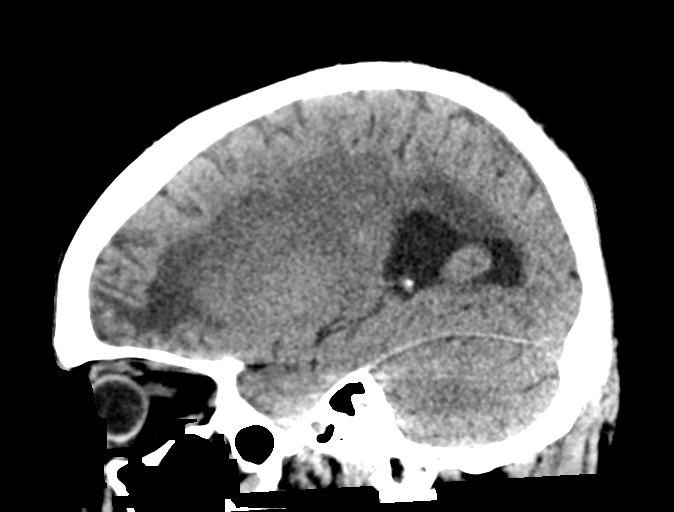
[im 28/56  brain]
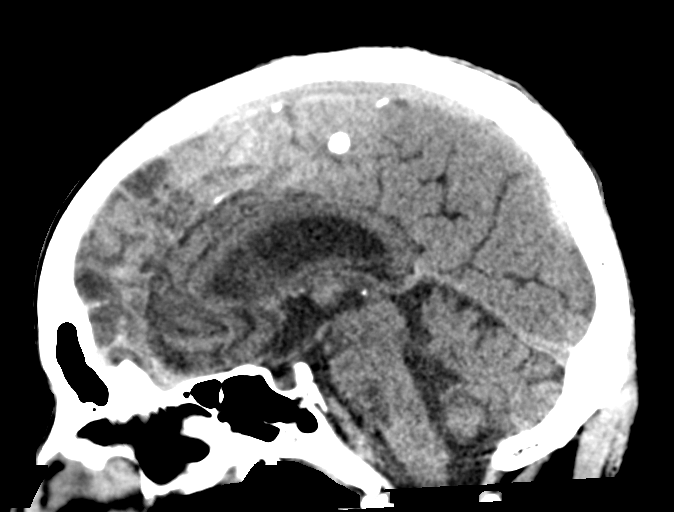
[im 37/56  brain]
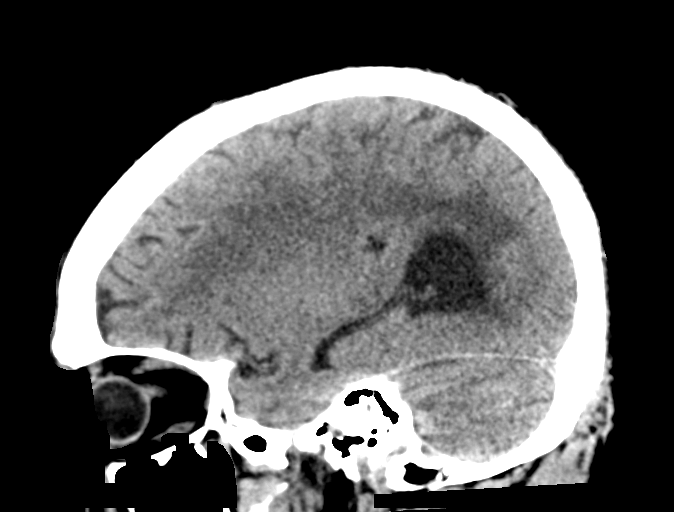

[16 of 47 positions shown; findings below may reference images not displayed]

FINDINGS: Brain: Ventricles, cisterns and CSF spaces are within normal. There
is moderate chronic ischemic microvascular disease present. Old
bifrontal infarcts left larger than right. Suggestion of old infarct
involving the anterior aspect of the left temporal lobe. No evidence
of mass effect or midline shift. No evidence of acute hemorrhage.

Vascular: No hyperdense vessel or unexpected calcification.

Skull: Normal. Negative for fracture or focal lesion.

Sinuses/Orbits: No acute finding.

Other: None.
IMPRESSION: 1.  No acute findings.

2. Old anterior left temporal and bifrontal infarcts left larger
than right. Moderate chronic ischemic microvascular disease.

## 2019-11-07 IMAGING — CR DG CHEST 2V
2 series · 2 of 2 positions shown · non-contrast
Comparison: Two-view chest x-ray [DATE]

CLINICAL DATA: Confusion and dizziness. Hyperglycemia.

EXAM:
CHEST - 2 VIEW

[chest lat]
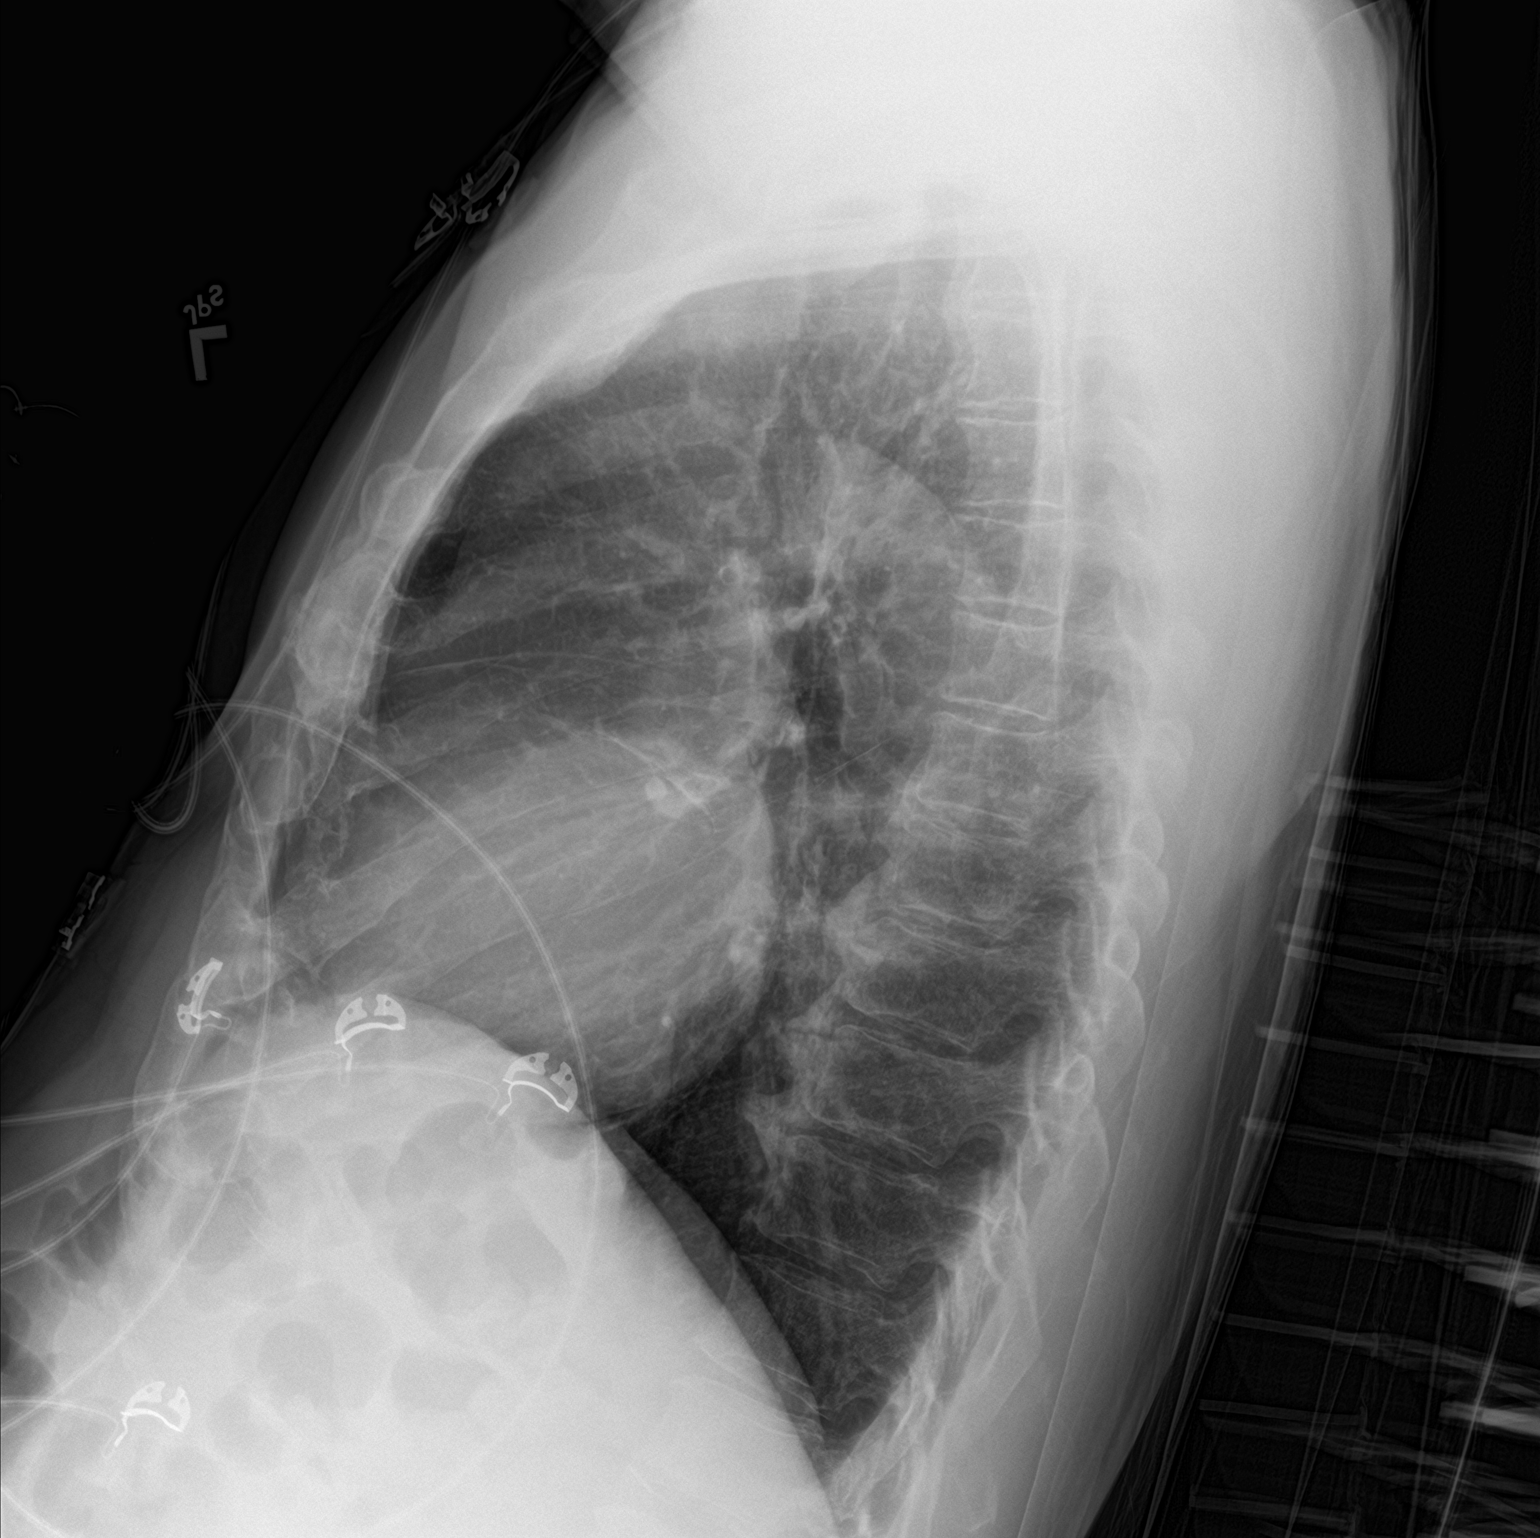

[chest ap]
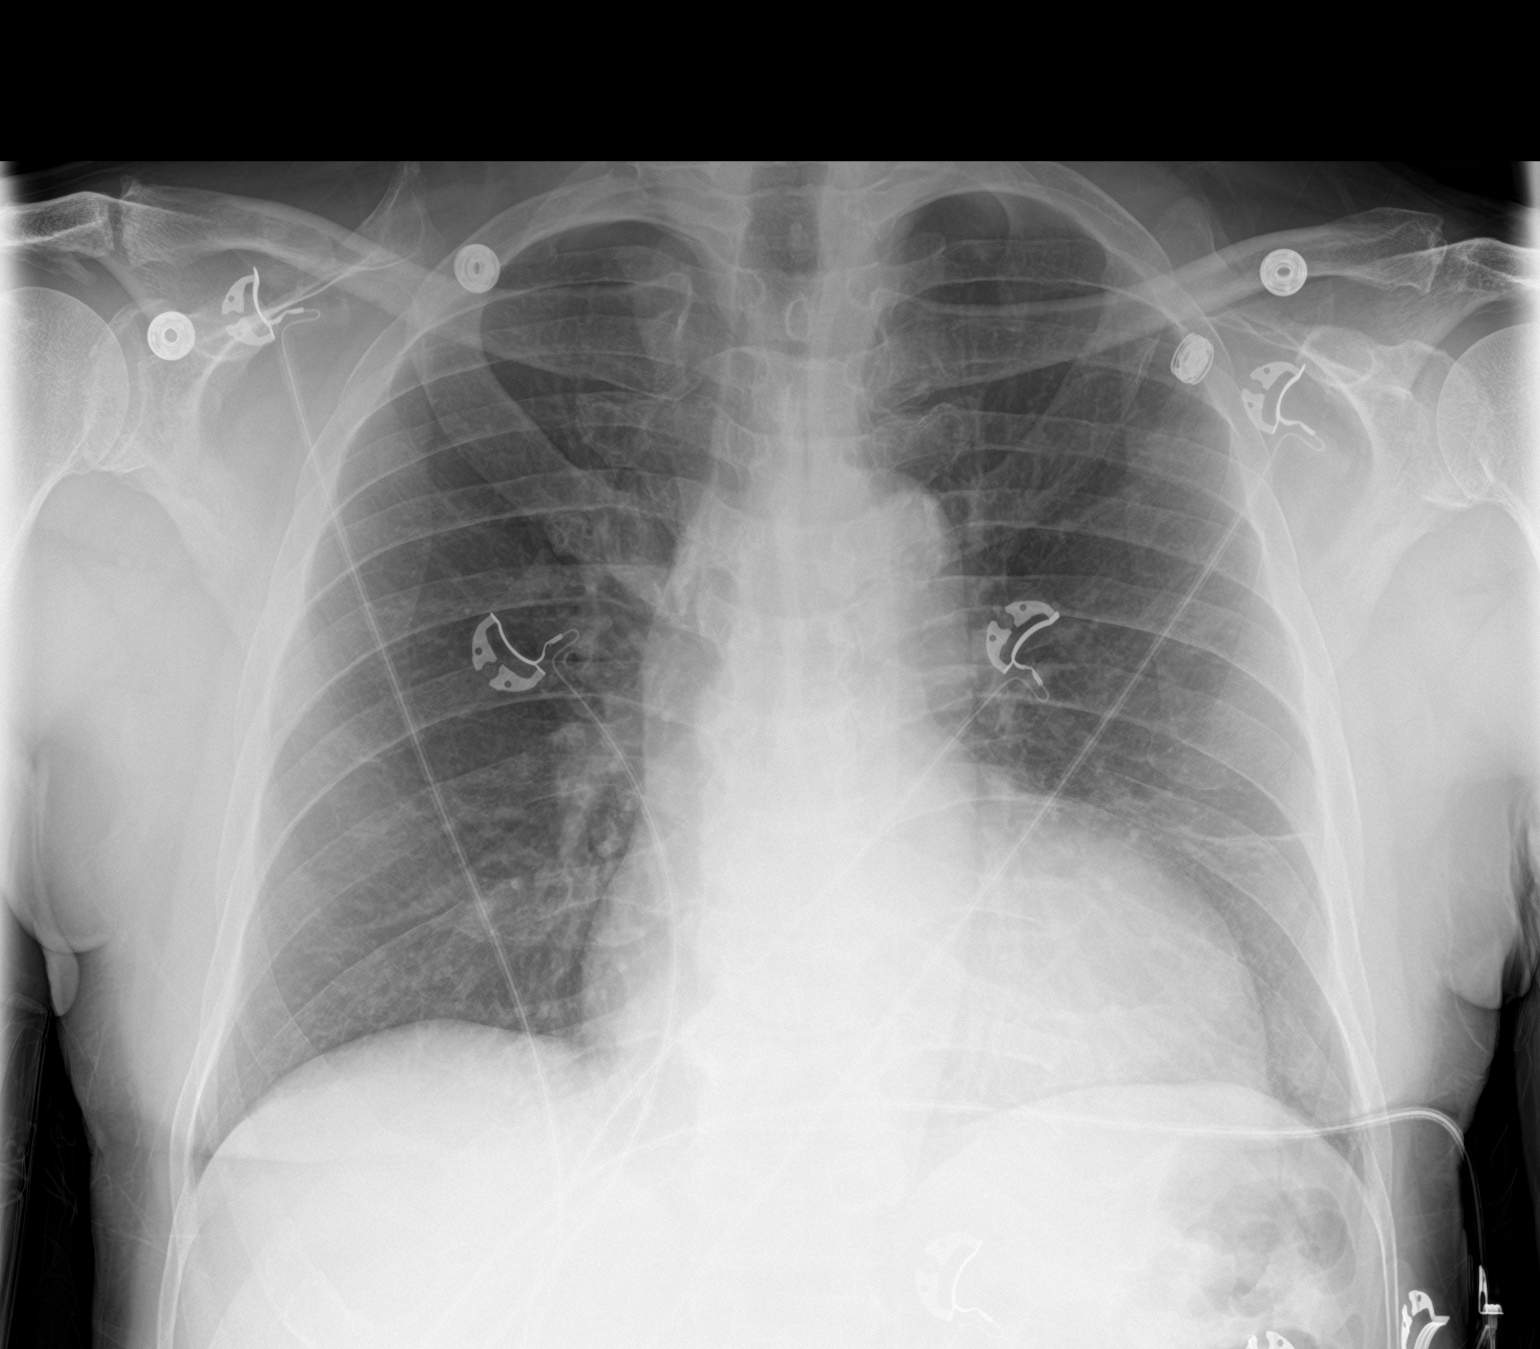

[2 of 2 positions shown; findings below may reference images not displayed]

FINDINGS: Heart size is exaggerated by low lung volumes. No edema or effusion
is present. No focal airspace disease is present. Atherosclerotic
changes are noted at the aortic arch.
IMPRESSION: No acute cardiopulmonary disease.

## 2019-11-07 MED ORDER — SODIUM CHLORIDE 0.9% FLUSH
3.0000 mL | Freq: Two times a day (BID) | INTRAVENOUS | Status: DC
  Administered 2019-11-07: 3 mL via INTRAVENOUS

## 2019-11-07 MED ORDER — INSULIN GLARGINE 100 UNIT/ML ~~LOC~~ SOLN
15.0000 [IU] | Freq: Every day | SUBCUTANEOUS | Status: DC
  Administered 2019-11-07: 15 [IU] via SUBCUTANEOUS
  Filled 2019-11-07 (×2): qty 0.15

## 2019-11-07 MED ORDER — SODIUM BICARBONATE 650 MG PO TABS
650.0000 mg | ORAL_TABLET | Freq: Two times a day (BID) | ORAL | Status: DC
  Administered 2019-11-07 – 2019-11-08 (×2): 650 mg via ORAL
  Filled 2019-11-07 (×2): qty 1

## 2019-11-07 MED ORDER — ACETAMINOPHEN 325 MG PO TABS
650.0000 mg | ORAL_TABLET | Freq: Four times a day (QID) | ORAL | Status: DC | PRN
  Administered 2019-11-08: 650 mg via ORAL
  Filled 2019-11-07: qty 2

## 2019-11-07 MED ORDER — HYDRALAZINE HCL 25 MG PO TABS
25.0000 mg | ORAL_TABLET | Freq: Three times a day (TID) | ORAL | Status: DC
  Administered 2019-11-07 – 2019-11-08 (×4): 25 mg via ORAL
  Filled 2019-11-07 (×4): qty 1

## 2019-11-07 MED ORDER — HEPARIN SODIUM (PORCINE) 5000 UNIT/ML IJ SOLN
5000.0000 [IU] | Freq: Three times a day (TID) | INTRAMUSCULAR | Status: DC
  Administered 2019-11-07 – 2019-11-08 (×3): 5000 [IU] via SUBCUTANEOUS
  Filled 2019-11-07 (×3): qty 1

## 2019-11-07 MED ORDER — INSULIN ASPART 100 UNIT/ML ~~LOC~~ SOLN
0.0000 [IU] | Freq: Every day | SUBCUTANEOUS | Status: DC
  Administered 2019-11-07: 5 [IU] via SUBCUTANEOUS

## 2019-11-07 MED ORDER — LATANOPROST 0.005 % OP SOLN
1.0000 [drp] | Freq: Every day | OPHTHALMIC | Status: DC
  Administered 2019-11-07: 1 [drp] via OPHTHALMIC
  Filled 2019-11-07: qty 2.5

## 2019-11-07 MED ORDER — ACETAMINOPHEN 650 MG RE SUPP: 650.0000 mg | Freq: Four times a day (QID) | RECTAL | Status: DC | PRN

## 2019-11-07 MED ORDER — SODIUM CHLORIDE 0.9 % IV BOLUS
1000.0000 mL | Freq: Once | INTRAVENOUS | Status: AC
  Administered 2019-11-07: 1000 mL via INTRAVENOUS

## 2019-11-07 MED ORDER — INSULIN GLARGINE 100 UNIT/ML ~~LOC~~ SOLN: 10.0000 [IU] | Freq: Every day | SUBCUTANEOUS | Status: DC

## 2019-11-07 MED ORDER — SODIUM CHLORIDE 0.9 % IV SOLN: INTRAVENOUS | Status: DC

## 2019-11-07 MED ORDER — INSULIN ASPART 100 UNIT/ML ~~LOC~~ SOLN
0.0000 [IU] | Freq: Three times a day (TID) | SUBCUTANEOUS | Status: DC
  Administered 2019-11-07: 11 [IU] via SUBCUTANEOUS
  Administered 2019-11-08: 3 [IU] via SUBCUTANEOUS
  Administered 2019-11-08: 11 [IU] via SUBCUTANEOUS

## 2019-11-07 NOTE — Consult Note (Addendum)
[facility]rology Consultation Note  Requesting MD: Dr Evette Doffing, Damita Dunnings Reason for consult: CKD5  HPI:  Jonathan Kane is a 74 y.o. male with history of uncontrolled hypertension, diabetes, BPH, chronic hep C status post treatment, CKD stage V followed by Dr. Neta Ehlers at [facility], presented with confusion/change in mental status, seen as a consultation at the request of Dr. Evette Doffing for CKD stage V.  Patient was last seen by his nephrologist on 09/02/2019.  The CKD was thought to be due to diabetic nephropathy and patient was referred to vascular surgery for the placement of permanent access.  Vein mapping was already done.  The serum creatinine level was around 5.3-6.3 outpatient.  Now patient  presented to ER when his niece found him confused at home.  The blood pressure was elevated in ER.  The lab consistent with creatinine level of 6.75, BUN 64, CO2 20 and potassium 3.8.  Patient received a liter of IV fluid in ER and admitted for further evaluation.  Chest with few bacteria, leukocytes, proteinuria and glucosuria.  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  During evaluation, patient was alert awake and oriented x3.  He reports  feeling good and denies nausea, vomiting, weakness, dysgeusia, chest pain, shortness of breath.  He was answering appropriately.  He understand the severity of his kidney disease and informs me that he has been following with his nephrologist closely. Denies dysuria, urgency, pelvic or flank pain.   Creatinine, Ser  Date/Time Value Ref Range Status  11/07/2019 07:17 AM 6.75 (H) 0.61 - 1.24 mg/dL Final  07/03/2017 07:23 AM 2.13 (H) 0.61 - 1.24 mg/dL Final     PMHx:   Past Medical History:  Diagnosis Date  . Diabetes mellitus without complication ([place])   . Hypertension   . Renal disorder     Past Surgical History:  Procedure Laterality Date  . COLONOSCOPY      Family Hx:  Family  History  Problem Relation Age of Onset  . Ovarian cancer Mother   . Diabetes Sister     Social History:  reports that he has quit smoking. He has never used smokeless tobacco. He reports current alcohol use. He reports that he does not use drugs.  Allergies:  Allergies  Allergen Reactions  . Dulaglutide     TRULICITY:  GI Upset intolerance    Medications: Prior to Admission medications   Medication Sig Start Date End Date Taking? Authorizing Provider  aspirin EC 81 MG tablet Take 81 mg by mouth daily.   Yes [provider]  cholecalciferol (VITAMIN D3) 25 MCG (1000 UNIT) tablet Take 1,000 Units by mouth daily.   Yes [provider]  cyclobenzaprine (FLEXERIL) 10 MG tablet Take 10 mg by mouth 3 (three) times daily. 11/06/19  Yes [provider]  diltiazem (TIAZAC) 180 MG 24 hr capsule Take 180 mg by mouth daily. 10/16/19  Yes [provider]  hydrALAZINE (APRESOLINE) 25 MG tablet Take 25 mg by mouth 3 (three) times daily.   Yes [provider]  ibuprofen (ADVIL,MOTRIN) 200 MG tablet Take 200 mg by mouth daily as needed for mild pain.   Yes [provider]  insulin aspart (NOVOLOG) 100 UNIT/ML FlexPen Inject 5 Units into the skin 3 (three) times daily. Before meals, up to 30 units/day 07/03/19  Yes [provider]  latanoprost (XALATAN) 0.005 % ophthalmic solution Place 1 drop into both eyes at bedtime. 05/24/19  Yes [provider]  Magnesium 200 MG TABS Take 200 mg by mouth daily.   Yes [provider]  nitroGLYCERIN (NITROSTAT) 0.4 MG SL tablet Place 1 tablet (0.4 mg total) under the tongue every 5 (five) minutes as needed for chest pain. 07/03/17  Yes Dorie Rank, MD  ondansetron (ZOFRAN) 4 MG tablet Take 4 mg by mouth every 8 (eight) hours as needed for nausea or vomiting.   Yes [provider]  Potassium 99 MG TABS Take 99 mg by mouth daily.   Yes [provider]  sildenafil (REVATIO) 20 MG  tablet Take 40-60 mg by mouth daily as needed. 10/10/19  Yes [provider]  simethicone (MYLICON) 0000000 MG chewable tablet Chew 125 mg by mouth every 6 (six) hours as needed for flatulence.   Yes [provider]  TRESIBA FLEXTOUCH 100 UNIT/ML SOPN FlexTouch Pen Inject 35 Units into the skin daily. 11/03/19  Yes [provider]  ACCU-CHEK AVIVA PLUS test strip 1 each by Other route as needed.  04/06/17   [provider]  ACCU-CHEK SOFTCLIX LANCETS lancets CHECK BLOOD SUGAR TWO TO THREE TIMES DAILY 04/05/17   [provider]  Alcohol Swabs (B-D SINGLE USE SWABS REGULAR) PADS USE TO CHECK BLOOD SUGAR AS DIRECTED 01/26/17   [provider]  Blood Glucose Monitoring Suppl (GLUCOCOM BLOOD GLUCOSE MONITOR) DEVI Use as directed.  Dx Code: E11.621 10/26/16   [provider]  Insulin Pen Needle (FIFTY50 PEN NEEDLES) 31G X 8 MM MISC Use as directed per sliding scale.  Dx Code: E11.621 02/03/16   [provider]  PREVNAR 13 SUSP injection Inject 0.5 mLs into the muscle once.  04/28/17   [provider]    I have reviewed the patient's current medications.  Labs:  Results for orders placed or performed during the hospital encounter of 11/07/19 (from the past 48 hour(s))  CBG monitoring, ED     Status: Abnormal   Collection Time: 11/07/19  6:27 AM  Result Value Ref Range   Glucose-Capillary 111 (H) 70 - 99 mg/dL  Comprehensive metabolic panel     Status: Abnormal   Collection Time: 11/07/19  7:17 AM  Result Value Ref Range   Sodium 142 135 - 145 mmol/L   Potassium 3.8 3.5 - 5.1 mmol/L   Chloride 108 98 - 111 mmol/L   CO2 20 (L) 22 - 32 mmol/L   Glucose, Bld 152 (H) 70 - 99 mg/dL   BUN 64 (H) 8 - 23 mg/dL   Creatinine, Ser 6.75 (H) 0.61 - 1.24 mg/dL   Calcium 8.6 (L) 8.9 - 10.3 mg/dL   Total Protein 6.9 6.5 - 8.1 g/dL   Albumin 3.3 (L) 3.5 - 5.0 g/dL   AST 14 (L) 15 - 41 U/L   ALT 16 0 - 44 U/L   Alkaline Phosphatase 65 38 -  126 U/L   Total Bilirubin 0.3 0.3 - 1.2 mg/dL   GFR calc non Af Amer 7 (L) >60 mL/min   GFR calc Af Amer 9 (L) >60 mL/min   Anion gap 14 5 - 15    Comment: Performed at [facility], [address] [place], [place] [address]  CBC with Differential     Status: Abnormal   Collection Time: 11/07/19  7:17 AM  Result Value Ref Range   WBC 7.8 4.0 - 10.5 K/uL   RBC 3.32 (L) 4.22 - 5.81 MIL/uL   Hemoglobin 9.6 (L) 13.0 - 17.0 g/dL  HCT 28.5 (L) 39.0 - 52.0 %   MCV 85.8 80.0 - 100.0 fL   MCH 28.9 26.0 - 34.0 pg   MCHC 33.7 30.0 - 36.0 g/dL   RDW 12.6 11.5 - 15.5 %   Platelets 182 150 - 400 K/uL   nRBC 0.0 0.0 - 0.2 %   Neutrophils Relative % 81 %   Neutro Abs 6.3 1.7 - 7.7 K/uL   Lymphocytes Relative 10 %   Lymphs Abs 0.8 0.7 - 4.0 K/uL   Monocytes Relative 7 %   Monocytes Absolute 0.6 0.1 - 1.0 K/uL   Eosinophils Relative 1 %   Eosinophils Absolute 0.1 0.0 - 0.5 K/uL   Basophils Relative 1 %   Basophils Absolute 0.1 0.0 - 0.1 K/uL   Immature Granulocytes 0 %   Abs Immature Granulocytes 0.02 0.00 - 0.07 K/uL    Comment: Performed at [facility] [address] [place], [place] [address]  Lipase, blood     Status: Abnormal   Collection Time: 11/07/19  7:17 AM  Result Value Ref Range   Lipase 55 (H) 11 - 51 U/L    Comment: Performed at [facility] [address] [place], [place] [address]  Lactic acid, plasma     Status: None   Collection Time: 11/07/19  7:17 AM  Result Value Ref Range   Lactic Acid, Venous 1.1 0.5 - 1.9 mmol/L    Comment: Performed at [facility] [address] [place], [place] [address]  Urinalysis, Routine w reflex microscopic     Status: Abnormal   Collection Time: 11/07/19  8:18 AM  Result Value Ref Range   Color, Urine YELLOW YELLOW   APPearance HAZY (A) CLEAR   Specific Gravity, Urine 1.011 1.005 - 1.030   pH 5.0 5.0 - 8.0   Glucose, UA >=500 (A) NEGATIVE mg/dL   Hgb urine dipstick SMALL (A) NEGATIVE   Bilirubin Urine  NEGATIVE NEGATIVE   Ketones, ur NEGATIVE NEGATIVE mg/dL   Protein, ur >=300 (A) NEGATIVE mg/dL   Nitrite NEGATIVE NEGATIVE   Leukocytes,Ua NEGATIVE NEGATIVE   RBC / HPF 0-5 0 - 5 RBC/hpf   WBC, UA 21-50 0 - 5 WBC/hpf   Bacteria, UA RARE (A) NONE SEEN   Squamous Epithelial / LPF 0-5 0 - 5   Hyaline Casts, UA PRESENT    Granular Casts, UA PRESENT    Amorphous Crystal PRESENT     Comment: Performed at [facility], [place] [address], [place], [place] [address]  CBG monitoring, ED     Status: Abnormal   Collection Time: 11/07/19  9:23 AM  Result Value Ref Range   Glucose-Capillary 172 (H) 70 - 99 mg/dL     ROS:  Pertinent items noted in HPI and remainder of comprehensive ROS otherwise negative.  Physical Exam: Vitals:   11/07/19 1230 11/07/19 1300  BP: (!) 178/100 (!) 167/108  Pulse: 83   Resp: 20 13  Temp:    SpO2: 99%      General exam: Appears calm and comfortable  Respiratory system: Clear to auscultation. Respiratory effort normal. No wheezing or crackle Cardiovascular system: S1 & S2 heard, RRR.  No pedal edema. Gastrointestinal system: Abdomen is nondistended, soft and nontender. Normal bowel sounds heard. Central nervous system: Alert and Oriented to 11/07/2019, [facility] name.. No focal neurological deficits.  No asterixis Extremities: Symmetric 5 x 5 power. Skin: No rashes, lesions or ulcers Psychiatry: Judgement and insight  appear normal. Mood & affect appropriate.   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 -Currently his mental status seems reasonable without any feature of uremic encephalopathy.  Electrolytes and volume status acceptable. Patient wants to think about dialysis and if possible follow up his nephrologist before starting HD.  I agree that he is heading towards requiring dialysis soon, however I do not see any urgent indication to start HD today.   I tried to call his daughter multiple times  without success.  Agree with US renal to r/o obstruction, less likely Monitor mental status and repeat lab in am.  Strict I/o Avoid nephrotoxins, contrast, hypotensive episode.  #Hypertension: Resume home medication.  He has no edema.  He has a history of nonadherence with his medication.  #Altered mental status: Seems like mental status has improved.  CT scan with no acute finding.  Uremia certainly can play some role however he does have this level of BUN for quite some time.  Continue to monitor.  #Anemia of CKD: Check iron studies.    #Secondary hyperparathyroidism: PTH level was 267 on 11/03/2019 at outpatient.  At goal for CKD 5.  Check phosphorus level.  Thank you for the consult.  Will follow with you.   Myleah Cavendish Tanna Furry 11/07/2019, 2:04 PM  [facility].

## 2019-11-07 NOTE — Progress Notes (Signed)
NEW ADMISSION NOTE New Admission Note:   Arrival Method: stretcher  Mental Orientation: alert Telemetry: #4 Assessment: Completed Skin: Intact IV: Patent Pain:  Safety Measures: Safety Fall Prevention Plan has been given, discussed and signed Admission: Completed [facility] Orientation: Patient has been orientated to the room, unit and staff.  Family:  Orders have been reviewed and implemented. Will continue to monitor the patient. Call light has been placed within reach and bed alarm has been activated.   Shela Commons, RN

## 2019-11-07 NOTE — ED Notes (Signed)
Bladder Scanned 264ml

## 2019-11-07 NOTE — ED Triage Notes (Signed)
Pt from home where his niece called for EMS this morning for AMS. Niece, who lives next door, reported  she noticed his door was wide open around 4am. When she went to check on him, he would not answer. After banging on the door, pt was able to answer but was having a difficult time with his words. LSW yesterday. Pt reported feeling cramping in his R leg a couple of days ago. Pt is A&Ox3 and following all commands, but is confused at times, with mild drift to R leg and slurred speech

## 2019-11-07 NOTE — ED Provider Notes (Signed)
[facility] EMERGENCY DEPARTMENT Provider Note   CSN: [id number] Arrival date & time: 11/07/19  [id number]     History Chief Complaint  Patient presents with  . Altered Mental Status    Jonathan Kane is a 74 y.o. male.  Patient is a 74 year old male with past medical history of hypertension and diabetes presenting to the emergency department due to feeling unwell.  Patient lives alone but his niece lives next to him.  The niece reports that about 4 AM this morning the patient's front door was open which was unusual for him.  Reports that she went over to see him and he seemed to be acting slightly altered so she called 911 to have him brought to the emergency department.  Patient patient is alert and oriented x4 here in the ED.  He reports that he has been feeling "off balance" since last Friday.  Reports he has some stomach upset and headache.  Reports that this morning in the bathroom he felt so off balance that he had to sit down in the bathroom and was unable to regain his balance after that.  Denies dizziness or sensation that he is spinning or the room is spinning.  Reports that he has had some muscle cramps in his bilateral legs as well, worse on the right side.  Denies any dysuria, vomiting, diarrhea, cough, chest pain, shortness of breath, URI symptoms.         Past Medical History:  Diagnosis Date  . Diabetes mellitus without complication ([place])   . Hypertension   . Renal disorder     Patient Active Problem List   Diagnosis Date Noted  . CKD (chronic kidney disease) stage 5, GFR less than 15 ml/min (HCC) 09/02/2019  . CKD (chronic kidney disease) stage 4, GFR 15-29 ml/min (HCC) 08/01/2019  . Paronychia of fifth toe of left foot 05/29/2019  . Microalbuminuria due to type 2 diabetes mellitus ([place]) 02/14/2019  . Posterior vitreous detachment of both eyes 01/17/2019  . Presbyopia of both eyes 01/17/2019  . Pseudophakia of both eyes 01/17/2019  . Acute kidney  injury ([place]) 10/04/2018  . Abnormal finding on diagnostic imaging of right testicle 09/14/2018  . Epididymitis, left 09/14/2018  . Swelling of left testicle 08/23/2018  . Primary open angle glaucoma (POAG) of both eyes, mild stage 01/02/2018  . Benign hypertension with chronic kidney disease, stage V ([place]) 11/30/2017  . Proteinuria 11/30/2017  . Mouth burn, initial encounter 11/10/2017  . Vitamin D deficiency, unspecified 11/10/2017  . Erectile dysfunction associated with type 2 diabetes mellitus ([place]) 08/29/2017  . 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 ([place]) 08/16/2017  . Hypoglycemia 08/16/2017  . PCO (posterior capsular opacification), left 03/03/2017  . Hypermetropia of both eyes 03/03/2017  . 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 Herpes simplex infection 07/26/2016  . CKD stage 4 due to type 2 diabetes mellitus ([place]) 07/07/2016  . Noncompliance with diabetes treatment 07/07/2016  . 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 Combined arterial insufficiency and corporo-venous occlusive erectile dysfunction 03/04/2016  . Allergic rhinitis with postnasal drip 02/24/2016  . 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  . Type 2 diabetes mellitus ([place]) 02/23/2016  . Essential hypertension 02/23/2016  . Musculoskeletal chest pain 02/23/2016  . 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[place]) 12/09/2015  . Polyneuropathy, unspecified 11/12/2013  . Constipation 03/23/2013  . Hallucinations 03/23/2013  . Polyneuropathy in diabetes ([place]) 03/23/2013  .  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 Diabetes mellitus type II, controlled ([place]) 08/30/2011  . 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 No pertinent surgical history.     Family History  Problem Relation Age of Onset  . Ovarian cancer Mother   . Diabetes Sister     Social History   Tobacco Use  .  Smoking status: Never Smoker  . Smokeless tobacco: Never Used  Substance Use Topics  . Alcohol use: Yes    Comment: 6pack can last me 2-3 weeks  . Drug use: No    Home Medications Prior to Admission medications   Medication Sig Start Date End Date Taking? Authorizing Provider  aspirin EC 81 MG tablet Take 81 mg by mouth daily.   Yes [provider]  cholecalciferol (VITAMIN D3) 25 MCG (1000 UNIT) tablet Take 1,000 Units by mouth daily.   Yes [provider]  cyclobenzaprine (FLEXERIL) 10 MG tablet Take 10 mg by mouth 3 (three) times daily. 11/06/19  Yes [provider]  diltiazem (TIAZAC) 180 MG 24 hr capsule Take 180 mg by mouth daily. 10/16/19  Yes [provider]  hydrALAZINE (APRESOLINE) 25 MG tablet Take 25 mg by mouth 3 (three) times daily.   Yes [provider]  ibuprofen (ADVIL,MOTRIN) 200 MG tablet Take 200 mg by mouth daily as needed for mild pain.   Yes [provider]  insulin aspart (NOVOLOG) 100 UNIT/ML FlexPen Inject 5 Units into the skin 3 (three) times daily. Before meals, up to 30 units/day 07/03/19  Yes [provider]  latanoprost (XALATAN) 0.005 % ophthalmic solution Place 1 drop into both eyes at bedtime. 05/24/19  Yes [provider]  Magnesium 200 MG TABS Take 200 mg by mouth daily.   Yes [provider]  nitroGLYCERIN (NITROSTAT) 0.4 MG SL tablet Place 1 tablet (0.4 mg total) under the tongue every 5 (five) minutes as needed for chest pain. 07/03/17  Yes Dorie Rank, MD  ondansetron (ZOFRAN) 4 MG tablet Take 4 mg by mouth every 8 (eight) hours as needed for nausea or vomiting.   Yes [provider]  Potassium 99 MG TABS Take 99 mg by mouth daily.   Yes [provider]  sildenafil (REVATIO) 20 MG tablet Take 40-60 mg by mouth daily as needed. 10/10/19  Yes [provider]  simethicone (MYLICON) 0000000 MG chewable tablet Chew 125 mg by mouth every 6 (six) hours as needed  for flatulence.   Yes [provider]  TRESIBA FLEXTOUCH 100 UNIT/ML SOPN FlexTouch Pen Inject 35 Units into the skin daily. 11/03/19  Yes [provider]  ACCU-CHEK AVIVA PLUS test strip 1 each by Other route as needed.  04/06/17   [provider]  ACCU-CHEK SOFTCLIX LANCETS lancets CHECK BLOOD SUGAR TWO TO THREE TIMES DAILY 04/05/17   [provider]  Alcohol Swabs (B-D SINGLE USE SWABS REGULAR) PADS USE TO CHECK BLOOD SUGAR AS DIRECTED 01/26/17   [provider]  Blood Glucose Monitoring Suppl (GLUCOCOM BLOOD GLUCOSE MONITOR) DEVI Use as directed.  Dx Code: E11.621 10/26/16   [provider]  Insulin Pen Needle (FIFTY50 PEN NEEDLES) 31G X 8 MM MISC Use as directed per sliding scale.  Dx Code: E11.621 02/03/16   [provider]  PREVNAR 13 SUSP injection Inject 0.5 mLs into the muscle once.  04/28/17   [provider]    Allergies    Dulaglutide  Review of Systems   Review of Systems  Constitutional: Positive for fatigue. Negative for chills and fever.  HENT: Negative for congestion, rhinorrhea and sore throat.   Eyes: Negative for photophobia and visual disturbance.  Respiratory: Negative for cough and shortness of breath.   Cardiovascular: Negative for chest pain.  Gastrointestinal: Positive for abdominal pain and nausea. Negative for diarrhea and vomiting.  Genitourinary: Negative for dysuria.  Musculoskeletal: Positive for myalgias. Negative for arthralgias and back pain.  Skin: Negative for rash and wound.  Neurological: Positive for weakness, light-headedness and headaches. Negative for dizziness, tremors, seizures, syncope, speech difficulty and numbness.    Physical Exam Updated Vital Signs BP (!) 172/102   Pulse 88   Temp 97.7 F (36.5 C) (Oral)   Resp 12   SpO2 98%   Physical Exam Vitals and nursing note reviewed.  Constitutional:      General: He is not in acute distress.    Appearance: Normal  appearance. He is normal weight. He is not ill-appearing, toxic-appearing or diaphoretic.  HENT:     Head: Normocephalic.     Nose: Nose normal.     Mouth/Throat:     Mouth: Mucous membranes are dry.  Eyes:     Extraocular Movements: Extraocular movements intact.     Conjunctiva/sclera: Conjunctivae normal.     Pupils: Pupils are equal, round, and reactive to light.  Cardiovascular:     Rate and Rhythm: Normal rate and regular rhythm.  Pulmonary:     Effort: Pulmonary effort is normal.     Breath sounds: Normal breath sounds.  Abdominal:     General: Abdomen is flat.  Musculoskeletal:     Right lower leg: No edema.     Left lower leg: No edema.  Skin:    General: Skin is dry.  Neurological:     General: No focal deficit present.     Mental Status: He is alert and oriented to person, place, and time.     Cranial Nerves: No cranial nerve deficit, dysarthria or facial asymmetry.     Sensory: Sensation is intact. No sensory deficit.     Motor: No weakness or pronator drift.     Coordination: Coordination normal. Finger-Nose-Finger Test and Heel to Shin Test normal.     Deep Tendon Reflexes: Reflexes normal.  Psychiatric:        Mood and Affect: Mood normal.        Thought Content: Thought content normal.     ED Results / Procedures / Treatments   Labs (all labs ordered are listed, but only abnormal results are displayed) Labs Reviewed  COMPREHENSIVE METABOLIC PANEL - Abnormal; Notable for the following components:      Result Value   CO2 20 (*)    Glucose, Bld 152 (*)    BUN 64 (*)    Creatinine, Ser 6.75 (*)    Calcium 8.6 (*)    Albumin 3.3 (*)    AST 14 (*)    GFR calc non Af Amer 7 (*)    GFR calc Af Amer 9 (*)    All other components within normal limits  CBC WITH DIFFERENTIAL/PLATELET - Abnormal; Notable for the following components:   RBC 3.32 (*)    Hemoglobin 9.6 (*)    HCT 28.5 (*)    All other components within normal limits  LIPASE, BLOOD - Abnormal;  Notable for the following components:   Lipase 55 (*)    All other components within normal limits  URINALYSIS, ROUTINE W REFLEX MICROSCOPIC - Abnormal; Notable  for the following components:   APPearance HAZY (*)    Glucose, UA >=500 (*)    Hgb urine dipstick SMALL (*)    Protein, ur >=300 (*)    Bacteria, UA RARE (*)    All other components within normal limits  CBG MONITORING, ED - Abnormal; Notable for the following components:   Glucose-Capillary 111 (*)    All other components within normal limits  CBG MONITORING, ED - Abnormal; Notable for the following components:   Glucose-Capillary 172 (*)    All other components within normal limits  SARS CORONAVIRUS 2 (TAT 6-24 HRS)  LACTIC ACID, PLASMA  CK    EKG EKG Interpretation  Date/Time:  Thursday November 07 2019 06:23:03 EST Ventricular Rate:  82 PR Interval:    QRS Duration: 88 QT Interval:  412 QTC Calculation: 482 R Axis:   62 Text Interpretation: Sinus rhythm Left atrial enlargement Anteroseptal infarct, old Nonspecific T abnormalities, lateral leads Confirmed by Orpah Greek [phone]) on 11/07/2019 6:51:13 AM   Radiology DG Chest 2 View  Result Date: 11/07/2019 CLINICAL DATA:  Confusion and dizziness. Hyperglycemia. EXAM: CHEST - 2 VIEW COMPARISON:  Two-view chest x-ray 07/03/2017 FINDINGS: Heart size is exaggerated by low lung volumes. No edema or effusion is present. No focal airspace disease is present. Atherosclerotic changes are noted at the aortic arch. IMPRESSION: No acute cardiopulmonary disease. Electronically Signed   By: San Morelle M.D.   On: 11/07/2019 07:34   CT Head Wo Contrast  Result Date: 11/07/2019 CLINICAL DATA:  Altered mental status. EXAM: CT HEAD WITHOUT CONTRAST TECHNIQUE: Contiguous axial images were obtained from the base of the skull through the vertex without intravenous contrast. COMPARISON:  None. FINDINGS: Brain: Ventricles, cisterns and CSF spaces are within normal. There  is moderate chronic ischemic microvascular disease present. 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 Suggestion of old infarct involving the anterior aspect of the left temporal lobe. No evidence of mass effect or midline shift. No evidence of acute hemorrhage. Vascular: No hyperdense vessel or unexpected calcification. Skull: Normal. Negative for fracture or focal lesion. Sinuses/Orbits: No acute finding. Other: None. IMPRESSION: 1.  No acute findings. 2. 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 Moderate chronic ischemic microvascular disease. Electronically Signed   By: Marin Olp M.D.   On: 11/07/2019 08:17    Procedures Procedures (including critical care time)  Medications Ordered in ED Medications  sodium chloride 0.9 % bolus 1,000 mL (1,000 mLs Intravenous New Bag/Given 11/07/19 [id number])    ED Course  I have reviewed the triage vital signs and the nursing notes.  Pertinent labs & imaging results that were available during my care of the patient were reviewed by me and considered in my medical decision making (see chart for details).  Clinical Course as of Nov 06 942  [place] Patient presenting with generally feeling unwell for about 1 week.  Positive orthostatic vital signs on my arrival, normal neuro exam and oriented x4.  Initiated fluids and waiting on work-up at this time   [KM]  [phone] Patient labs revealing creatinine of 6.75 and GFR of 9.  This is significantly worse from previous creatinine and GFR.  Patient will need to be admitted for renal failure.  Currently getting fluids   [KM]  [phone] Spoke with Dr. Frederico Hamman to admit the patient for further workup/treatment of renal failure. Normal K and patient without significant urinary retention, still making urine. The patient  appears reasonably stabilized for admission considering the current resources, flow, and capabilities available in the ED at this time, and I doubt any other [facility]  requiring further screening and/or treatment in the ED prior to admission.    [KM]    Clinical Course User Index [KM] Kristine Royal   MDM Rules/Calculators/A&P                       Final Clinical Impression(s) / ED Diagnoses Final diagnoses:  Acute renal failure, unspecified acute renal failure type [facility])    Rx / DC Orders ED Discharge Orders    None       Kristine Royal 11/07/19 [place], Wenda Overland, MD 11/11/19 [phone]

## 2019-11-07 NOTE — ED Notes (Signed)
Checked patient cbg it was 172 notified RN of blood sugar patient is resting with call bell in reach

## 2019-11-07 NOTE — H&P (Signed)
Date: 11/07/2019               Patient Name:  Jonathan Kane MRN: [id number]  DOB: 1945/12/08 Age / Sex: 74 y.o., male   PCP: Myrtis Hopping., MD         Medical Service: Internal Medicine Teaching Service         Attending Physician: Dr. Evette Doffing, Mallie Mussel, *    First Contact: Dr. Ronnald Ramp  Pager: [phone]  Second Contact: Dr. Koleen Distance Pager: [phone]       After Hours (After 5p/  First Contact Pager: [phone]  weekends / holidays): Second Contact Pager: [phone]   Chief Complaint: Altered mental status   History of Present Illness: Richard Squyres is a 74 year-old male with a medical history of CKD Stage IV-V, T2DM, BPH with obstruction, chronic HepC (cured), HTN, and ED who was brought in by his niece for evaluation of altered mental status. Patient remains altered at this time and history provided by niece Varney Biles who found him this AM. At about 4 AM today she noted his house door opened and went to check on him. She knocked on the door several times before he answered. When he did, he seemed confused and was non verbal. When asked what was wrong, he pointed at his BG meter. When asked if he was doing ok, he shook his head no. He also seemed off balance. In the ED, his only complaints are nausea, poor appetite, and L leg cramp. He denies fever, chills, HA, chest pain, shortness of breath, abdominal pain, urinary symptoms, changes in bowel movement, and lower extremity edema. No COVID/influenza exposures.   His niece is not aware of any recent changes in medications or NSAID use.  He does have ibuprofen listed in his medication list, but unclear if he has been taking it.     Meds:  Current Meds  Medication Sig  . aspirin EC 81 MG tablet Take 81 mg by mouth daily.  . cholecalciferol (VITAMIN D3) 25 MCG (1000 UNIT) tablet Take 1,000 Units by mouth daily.  . cyclobenzaprine (FLEXERIL) 10 MG tablet Take 10 mg by mouth 3 (three) times daily.  Marland Kitchen diltiazem (TIAZAC) 180 MG 24 hr  capsule Take 180 mg by mouth daily.  . hydrALAZINE (APRESOLINE) 25 MG tablet Take 25 mg by mouth 3 (three) times daily.  Marland Kitchen ibuprofen (ADVIL,MOTRIN) 200 MG tablet Take 200 mg by mouth daily as needed for mild pain.  Marland Kitchen insulin aspart (NOVOLOG) 100 UNIT/ML FlexPen Inject 5 Units into the skin 3 (three) times daily. Before meals, up to 30 units/day  . latanoprost (XALATAN) 0.005 % ophthalmic solution Place 1 drop into both eyes at bedtime.  . Magnesium 200 MG TABS Take 200 mg by mouth daily.  . nitroGLYCERIN (NITROSTAT) 0.4 MG SL tablet Place 1 tablet (0.4 mg total) under the tongue every 5 (five) minutes as needed for chest pain.  Marland Kitchen ondansetron (ZOFRAN) 4 MG tablet Take 4 mg by mouth every 8 (eight) hours as needed for nausea or vomiting.  . Potassium 99 MG TABS Take 99 mg by mouth daily.  . sildenafil (REVATIO) 20 MG tablet Take 40-60 mg by mouth daily as needed.  . simethicone (MYLICON) 0000000 MG chewable tablet Chew 125 mg by mouth every 6 (six) hours as needed for flatulence.  . TRESIBA FLEXTOUCH 100 UNIT/ML SOPN FlexTouch Pen Inject 35 Units into the skin daily.    Allergies: Allergies as of 11/07/2019 - Review Complete  11/07/2019  Allergen Reaction Noted  . Dulaglutide  02/23/2016   Past Medical History:  Diagnosis Date  . Diabetes mellitus without complication ([place])   . Hypertension   . Renal disorder     Family History:  Family History  Problem Relation Age of Onset  . Ovarian cancer Mother   . Diabetes Sister     Social History: Lives alone but able to take care of himself. Manages his medications. Likes to work in his yard. Stays active. Family reports no alcohol or tobacco use. He has a remote history of cocaine use that has been in remission for several years.   Review of Systems: A complete ROS was negative except as per HPI.   Physical Exam: Blood pressure (!) 189/114, pulse 84, temperature 97.7 F (36.5 C), temperature source Oral, resp. rate 16, SpO2 99  %.  General: Tired appearing male, sleeping in bed, in no acute distress HENT: NCAT, neck supple and FROM, OP clear without exudates or erythema, poor dentition  Eyes: anicteric sclera, PERRL Cardiac: regular rate and rhythm, nl S1/S2, no murmurs, rubs or gallops, no JVD  Pulm: CTAB, no wheezes or crackles, no increased work of breathing on room air  Abd: soft, NTND, bowel sounds are normoactive   Neuro: A&Ox2, CN II-XII intact, strength and sensation intact, no asterixis  Ext: warm and well perfused, no peripheral edema Derm: no rashes or lesions noted    EKG: personally reviewed my interpretation is NSR, left atrial enlargement, T wave inversion in V6, normal intervals  CXR: personally reviewed my interpretation is -no opacities, consolidations, or effusions  Head CT: Old anterior L temporal and bifrontal infarcts, L > R.  Moderate chronic ischemic microvascular disease.  Assessment & Plan by Problem: Active Problems:   Acute renal failure (ARF) (HCC)  #Acute on chronic renal failure #CKD stage IV-5 Patient is presenting with acute metabolic encephalopathy associated with nausea and anorexia suggestive of uremia in the setting of advanced chronic kidney disease. Low suspicion for underlying infection given no infectious symptoms. He follows up with nephrology at [facility], Dr. Neta Ehlers. He was last seen in 08/2019. BUN/Cr 68/5.39 without signs of uremia at that time. He has had vein mapping and has been evaluated by [facility] for AVF placement.  Renal US from 09/2018 showed a small renal cyst in the upper pole of the left kidney, but otherwise normal. - Nephrology consulted, appreciate recommendations - No hyperkalemia, acidosis, or volume overload  - Renal US to r/o obstruction given history of BPH with obstruction  - Avoid nephrotoxic medications  - Strict I/Os + daily weights  - BP control as below   # Hypertension - Continue home hydralazine 25 mg TID - Not taking Cardizem due to  lightheadedness   # Type 2 diabetes: Last HbA1c 8.2 on 216. Recently seen by DM services with documentation of BG ranges 55-280. On Tresiba 35 units + Novolog 6 units TID w/ meals + SSI.  - Lantus 15 units + SSI-M + HS scale  - BG monitoring  # BPH with hx of obstruction: Not on any alpha blockers at home.  Denies urinary symptoms at this time.  Will monitor.   Diet: Renal/CM VTE ppx: SQ heparin   CODE STATUS: FULL CODE, unable to confirm an admission due to AMS   Dispo: Admit patient to Inpatient with expected length of stay greater than 2 midnights.  SignedWelford Roche, MD 11/07/2019, 12:33 PM  Pager: [phone]

## 2019-11-07 NOTE — ED Notes (Signed)
Collected patient urine sample patient is resting with call bell in reach

## 2019-11-07 NOTE — ED Notes (Addendum)
Family updated as to patient's status.

## 2019-11-07 NOTE — ED Notes (Signed)
Gave patient to the urinal

## 2019-11-07 NOTE — ED Notes (Signed)
Varney Biles, niece, [phone] Clarene Critchley, daughter, [phone]) [phone]

## 2019-11-08 DIAGNOSIS — I129 Hypertensive chronic kidney disease with stage 1 through stage 4 chronic kidney disease, or unspecified chronic kidney disease: Secondary | ICD-10-CM

## 2019-11-08 DIAGNOSIS — Z79899 Other long term (current) drug therapy: Secondary | ICD-10-CM

## 2019-11-08 DIAGNOSIS — G9341 Metabolic encephalopathy: Principal | ICD-10-CM

## 2019-11-08 DIAGNOSIS — N184 Chronic kidney disease, stage 4 (severe): Secondary | ICD-10-CM

## 2019-11-08 DIAGNOSIS — E1122 Type 2 diabetes mellitus with diabetic chronic kidney disease: Secondary | ICD-10-CM

## 2019-11-08 DIAGNOSIS — Z888 Allergy status to other drugs, medicaments and biological substances status: Secondary | ICD-10-CM

## 2019-11-08 DIAGNOSIS — N179 Acute kidney failure, unspecified: Secondary | ICD-10-CM

## 2019-11-08 DIAGNOSIS — Z794 Long term (current) use of insulin: Secondary | ICD-10-CM

## 2019-11-08 LAB — IRON AND TIBC
Iron: 52 ug/dL (ref 45–182)
Saturation Ratios: 22 % (ref 17.9–39.5)
TIBC: 241 ug/dL — ABNORMAL LOW (ref 250–450)
UIBC: 189 ug/dL

## 2019-11-08 LAB — RENAL FUNCTION PANEL
Albumin: 2.9 g/dL — ABNORMAL LOW (ref 3.5–5.0)
Anion gap: 10 (ref 5–15)
BUN: 63 mg/dL — ABNORMAL HIGH (ref 8–23)
CO2: 20 mmol/L — ABNORMAL LOW (ref 22–32)
Calcium: 8.4 mg/dL — ABNORMAL LOW (ref 8.9–10.3)
Chloride: 110 mmol/L (ref 98–111)
Creatinine, Ser: 6.57 mg/dL — ABNORMAL HIGH (ref 0.61–1.24)
GFR calc Af Amer: 9 mL/min — ABNORMAL LOW (ref >60)
GFR calc non Af Amer: 8 mL/min — ABNORMAL LOW (ref >60)
Glucose, Bld: 145 mg/dL — ABNORMAL HIGH (ref 70–99)
Phosphorus: 5.2 mg/dL — ABNORMAL HIGH (ref 2.5–4.6)
Potassium: 3.6 mmol/L (ref 3.5–5.1)
Sodium: 140 mmol/L (ref 135–145)

## 2019-11-08 LAB — CBC
HCT: 27.4 % — ABNORMAL LOW (ref 39.0–52.0)
Hemoglobin: 9.5 g/dL — ABNORMAL LOW (ref 13.0–17.0)
MCH: 29.1 pg (ref 26.0–34.0)
MCHC: 34.7 g/dL (ref 30.0–36.0)
MCV: 84 fL (ref 80.0–100.0)
Platelets: 169 10*3/uL (ref 150–400)
RBC: 3.26 MIL/uL — ABNORMAL LOW (ref 4.22–5.81)
RDW: 12.7 % (ref 11.5–15.5)
WBC: 5.8 10*3/uL (ref 4.0–10.5)
nRBC: 0 % (ref 0.0–0.2)

## 2019-11-08 LAB — GLUCOSE, CAPILLARY
Glucose-Capillary: 168 mg/dL — ABNORMAL HIGH (ref 70–99)
Glucose-Capillary: 334 mg/dL — ABNORMAL HIGH (ref 70–99)

## 2019-11-08 LAB — FERRITIN: Ferritin: 123 ng/mL (ref 24–336)

## 2019-11-08 MED ORDER — SODIUM BICARBONATE 650 MG PO TABS: 650.0000 mg | ORAL_TABLET | Freq: Two times a day (BID) | ORAL | 0 refills | Status: AC

## 2019-11-08 MED ORDER — SENNOSIDES-DOCUSATE SODIUM 8.6-50 MG PO TABS
1.0000 | ORAL_TABLET | Freq: Once | ORAL | Status: AC
  Administered 2019-11-08: 1 via ORAL
  Filled 2019-11-08: qty 1

## 2019-11-08 MED ORDER — SODIUM BICARBONATE 650 MG PO TABS: 650.0000 mg | ORAL_TABLET | Freq: Two times a day (BID) | ORAL | 0 refills | Status: DC

## 2019-11-08 NOTE — Evaluation (Signed)
Occupational Therapy Evaluation Patient Details Name: Jonathan Kane MRN: [id number] DOB: 10-10-1946 Today's Date: 11/08/2019    History of Present Illness Pt is a 74 y/o male with PMH of CKD stage IV-V, DM 2, BPH with obstruction, chronic hep C (cured), HTN, brought to ED by his niece for evaluation of altered mental status after being found confused and non verbal. Found with acute metabolic encephalopathy in the setting of advanced chronic kidney disease.    Clinical Impression   PTA patient independent. Admitted for above and limited by problem list below, including decreased activity tolerance and impaired cognition. Short blessed test reveals deficits in short term memory and sequencing, scoring 12/28 (impairments significant with dementia) and recommend further testing.  He is able to complete transfers and in room mobility without AD with min guard fading to close supervision, min guard for LB ADls and supervision for grooming at sink.  Based on performance today, believe he will benefit from further OT service while admitted and after dc at [facility] level in order to optimize independence and return to PLOF.  Recommend intermittent supervision for IADL and medication mgmt at discharge.      Follow Up Recommendations  Home health OT;Supervision - Intermittent    Equipment Recommendations  None recommended by OT    Recommendations for Other Services       Precautions / Restrictions Precautions Precautions: None Restrictions Weight Bearing Restrictions: No      Mobility Bed Mobility Overal bed mobility: Needs Assistance Bed Mobility: Supine to Sit;Sit to Supine     Supine to sit: Supervision Sit to supine: Supervision   General bed mobility comments: HOB elevated, no assist required  Transfers Overall transfer level: Needs assistance Equipment used: None Transfers: Sit to/from Stand Sit to Stand: Min guard;Supervision         General transfer comment: min guard  initally, fading to close supervision for safety     Balance Overall balance assessment: Mild deficits observed, not formally tested                                         ADL either performed or assessed with clinical judgement   ADL Overall ADL's : Needs assistance/impaired     Grooming: Supervision/safety;Standing   Upper Body Bathing: Supervision/ safety;Set up;Sitting   Lower Body Bathing: Min guard;Sit to/from stand   Upper Body Dressing : Set up;Supervision/safety;Sitting   Lower Body Dressing: Min guard;Sit to/from stand Lower Body Dressing Details (indicate cue type and reason): able to adjust socks with ease, min guard dynamically standing  Toilet Transfer: Chief Financial Officer Details (indicate cue type and reason): min guard to close supervision for safety          Functional mobility during ADLs: Min guard;Supervision/safety General ADL Comments: limited by cognition and decreased activity tolerance     Vision Baseline Vision/History: Wears glasses Wears Glasses: Reading only Patient Visual Report: No change from baseline Vision Assessment?: No apparent visual deficits     Perception     Praxis      Pertinent Vitals/Pain Pain Assessment: (P) No/denies pain     Hand Dominance (P) Right   Extremity/Trunk Assessment Upper Extremity Assessment Upper Extremity Assessment: Overall WFL for tasks assessed   Lower Extremity Assessment Lower Extremity Assessment: Defer to PT evaluation   Cervical / Trunk Assessment Cervical / Trunk Assessment: Normal   Communication Communication Communication: (  P) No difficulties   Cognition Arousal/Alertness: (P) Awake/alert Behavior During Therapy: (P) WFL for tasks assessed/performed Overall Cognitive Status: (P) Impaired/Different from baseline Area of Impairment: (P) Awareness;Problem solving;Memory                     Memory: Decreased short-term memory        Problem Solving: Slow processing;Difficulty sequencing;Requires verbal cues General Comments: short blessed test reveals impairments in short term memory and sequencing, scoring 12/28 (impairment consistent with dementia)    General Comments  patient reports neice can assist with med mgmt and IADLs at dc     Exercises     Shoulder Instructions      Home Living Family/patient expects to be discharged to:: Private residence Living Arrangements: (P) Alone Available Help at Discharge: (P) Family;Available PRN/intermittently Type of Home: (P) House Home Access: (P) Stairs to enter Entrance Stairs-Number of Steps: (P) 1   Home Layout: (P) One level     Bathroom Shower/Tub: Occupational psychologist: (P) Standard     Home Equipment: (P) Shower seat - built in;Grab bars - toilet;Grab bars - tub/shower;Cane - single point   Additional Comments: (P) family in the next home      Prior Functioning/Environment Level of Independence: (P) Independent        Comments: (P) has a SPC but does not use,         OT Problem List: Decreased strength;Impaired balance (sitting and/or standing);Decreased knowledge of precautions;Decreased cognition;Decreased activity tolerance      OT Treatment/Interventions: Self-care/ADL training;DME and/or AE instruction;Balance training;Patient/family education;Cognitive remediation/compensation;Therapeutic activities    OT Goals(Current goals can be found in the care plan section) Acute Rehab OT Goals Patient Stated Goal: to get back home  OT Goal Formulation: With patient Time For Goal Achievement: 11/22/19 Potential to Achieve Goals: Good  OT Frequency: Min 2X/week   Barriers to D/C:            Co-evaluation              AM-PAC OT "6 Clicks" Daily Activity     Outcome Measure Help from another person eating meals?: None Help from another person taking care of personal grooming?: A Little Help from another person  toileting, which includes using toliet, bedpan, or urinal?: A Little Help from another person bathing (including washing, rinsing, drying)?: A Little Help from another person to put on and taking off regular upper body clothing?: None Help from another person to put on and taking off regular lower body clothing?: A Little 6 Click Score: 20   End of Session Equipment Utilized During Treatment: Gait belt Nurse Communication: Mobility status  Activity Tolerance: Patient tolerated treatment well Patient left: in bed;with call bell/phone within reach  OT Visit Diagnosis: Other abnormalities of gait and mobility (R26.89);Muscle weakness (generalized) (M62.81);Other symptoms and signs involving cognitive function                Time: [id number] OT Time Calculation (min): 25 min Charges:  OT General Charges $OT Visit: 1 Visit OT Evaluation $OT Eval Moderate Complexity: 1 Mod OT Treatments $Self Care/Home Management : 8-22 mins  Jolaine Artist, OT Acute Rehabilitation Services Pager [phone] Office [phone]   Jonathan Kane 11/08/2019, 12:32 PM

## 2019-11-08 NOTE — TOC Transition Note (Signed)
Transition of Care [facility]) - CM/SW Discharge Note   Patient Details  Name: TREVARIUS TAUL MRN: [id number] Date of Birth: Sep 10, 1946  Transition of Care [facility]) CM/SW Contact:  Bartholomew Crews, RN Phone Number: [phone] 11/08/2019, 2:28 PM   Clinical Narrative:    [place] unable to accept patient. [facility] unable to accept patient. Spoke with Tommi Rumps at [place] - referral accepted for [facility] PT and OT with start of care next week. No further transition needs identified.    Final next level of care: [place] Barriers to Discharge: No Barriers Identified   Patient Goals and CMS Choice Patient states their goals for this hospitalization and ongoing recovery are:: return home CMS Medicare.gov Compare Post Acute Care list provided to:: Patient Choice offered to / list presented to : Patient, Adult Children  Discharge Placement                       Discharge Plan and Services In-house Referral: NA Discharge Planning Services: CM Consult Post Acute Care Choice: Home Health          DME Arranged: N/A DME Agency: NA       HH Arranged: PT, OT [place] Agency: [place] Date [place]: 11/08/19 Time [place]: [id number] Representative spoke with at [place]: [place] ([place]) Interventions     Readmission Risk Interventions No flowsheet data found.

## 2019-11-08 NOTE — Evaluation (Signed)
Physical Therapy Evaluation Patient Details Name: Jonathan Kane MRN: [id number] DOB: 28-Nov-1945 Today's Date: 11/08/2019   History of Present Illness  Pt is a 74 y/o male with PMH of CKD stage IV-V, DM 2, BPH with obstruction, chronic hep C (cured), HTN, brought to ED by his niece for evaluation of altered mental status after being found confused and non verbal. Found with acute metabolic encephalopathy in the setting of advanced chronic kidney disease.   Clinical Impression  Pt was seen for mobility from his bedside to hall to stairs, and mainly note restrictions of fluidity and balance related to ROM of LE's.  He is expecting to get [facility] and will add HHPT for him, and could transfer to outpatient if he feels it is needed to finish therapy.  Follow acutely to get his movement on the hallway to look smoother, with better stride distances and more control coming down stairs, as currently footfalls are hard landing.     Follow Up Recommendations Home health PT;Supervision - Intermittent    Equipment Recommendations  None recommended by PT    Recommendations for Other Services       Precautions / Restrictions Precautions Precautions: None Restrictions Weight Bearing Restrictions: No      Mobility  Bed Mobility Overal bed mobility: Modified Independent Bed Mobility: Supine to Sit;Sit to Supine     Supine to sit: Supervision Sit to supine: Supervision   General bed mobility comments: hob elevated but did not use railing  Transfers Overall transfer level: Modified independent Equipment used: None Transfers: Sit to/from Stand Sit to Stand: Min guard;Supervision         General transfer comment: supervised but is able to do with no help  Ambulation/Gait Ambulation/Gait assistance: Supervision Gait Distance (Feet): 250 Feet Assistive device: None Gait Pattern/deviations: Step-through pattern;Wide base of support;Decreased stride length Gait velocity: WFL Gait velocity  interpretation: <1.31 ft/sec, indicative of household ambulator General Gait Details: pt is demonstrating changes reflecting his ROM in LE's, with limits of DF and hip ext shortening steps and flattening footfalls  Stairs Stairs: Yes Stairs assistance: Min guard;Supervision Stair Management: One rail Right;Alternating pattern;Forwards Number of Stairs: 8 General stair comments: no rail going up but was asked by PT to use coming down  Wheelchair Mobility    Modified Rankin (Stroke Patients Only)       Balance Overall balance assessment: Needs assistance Sitting-balance support: Feet supported Sitting balance-Leahy Scale: Normal   Postural control: Posterior lean Standing balance support: No upper extremity supported Standing balance-Leahy Scale: Good Standing balance comment: fair with gait                             Pertinent Vitals/Pain Pain Assessment: No/denies pain    Home Living Family/patient expects to be discharged to:: Private residence Living Arrangements: Alone Available Help at Discharge: Family;Available PRN/intermittently Type of Home: House Home Access: Stairs to enter   Entrance Stairs-Number of Steps: 1 Home Layout: One level Home Equipment: Shower seat - built in;Grab bars - toilet;Grab bars - tub/shower;Cane - single point Additional Comments: family in the next home    Prior Function Level of Independence: Independent         Comments: has a SPC but does not use,      Hand Dominance   Dominant Hand: Right    Extremity/Trunk Assessment   Upper Extremity Assessment Upper Extremity Assessment: Overall WFL for tasks assessed    Lower Extremity  Assessment Lower Extremity Assessment: Overall WFL for tasks assessed    Cervical / Trunk Assessment Cervical / Trunk Assessment: Normal  Communication   Communication: No difficulties  Cognition Arousal/Alertness: Awake/alert Behavior During Therapy: WFL for tasks  assessed/performed Overall Cognitive Status: Impaired/Different from baseline Area of Impairment: Awareness;Problem solving;Memory                     Memory: Decreased recall of precautions;Decreased short-term memory     Awareness: Intellectual Problem Solving: Slow processing;Requires verbal cues General Comments: has limited history information      General Comments General comments (skin integrity, edema, etc.): has family to assist at home    Exercises     Assessment/Plan    PT Assessment Patient needs continued PT services  PT Problem List Decreased range of motion;Decreased balance;Decreased coordination       PT Treatment Interventions DME instruction;Gait training;Stair training;Functional mobility training;Therapeutic activities;Therapeutic exercise;Balance training;Neuromuscular re-education;Patient/family education    PT Goals (Current goals can be found in the Care Plan section)  Acute Rehab PT Goals Patient Stated Goal: to get back home  PT Goal Formulation: With patient Time For Goal Achievement: 11/22/19 Potential to Achieve Goals: Good    Frequency Min 2X/week   Barriers to discharge Decreased caregiver support home alone but has family support    Co-evaluation               AM-PAC PT "6 Clicks" Mobility  Outcome Measure Help needed turning from your back to your side while in a flat bed without using bedrails?: None Help needed moving from lying on your back to sitting on the side of a flat bed without using bedrails?: None Help needed moving to and from a bed to a chair (including a wheelchair)?: None Help needed standing up from a chair using your arms (e.g., wheelchair or bedside chair)?: A Little Help needed to walk in hospital room?: A Little Help needed climbing 3-5 steps with a railing? : A Little 6 Click Score: 21    End of Session Equipment Utilized During Treatment: Gait belt Activity Tolerance: Patient tolerated  treatment well Patient left: in bed;with call bell/phone within reach(sitting on side of bed for lunch) Nurse Communication: Mobility status PT Visit Diagnosis: Difficulty in walking, not elsewhere classified (R26.2)    Time: 1125-1140 PT Time Calculation (min) (ACUTE ONLY): 15 min   Charges:   PT Evaluation $PT Eval Moderate Complexity: 1 Mod          Ramond Dial 11/08/2019, 12:42 PM   Mee Hives, PT MS Acute Rehab Dept. Number: [place]

## 2019-11-08 NOTE — Progress Notes (Signed)
Inpatient Diabetes Program Recommendations  AACE/ADA: New Consensus Statement on Inpatient Glycemic Control (2015)  Target Ranges:  Prepandial:   less than 140 mg/dL      Peak postprandial:   less than 180 mg/dL (1-2 hours)      Critically ill patients:  140 - 180 mg/dL   Lab Results  Component Value Date   GLUCAP 334 (H) 11/08/2019   HGBA1C 10.1 (H) 11/07/2019    Spoke with patient regarding A1c of 10.1. Discussed glucose and A1 goals. Pt reports having all DM supplies needed and is able to get all of his insulin. Pt reports compliance with insulin regimen. Discussed importance of checking CBGs and maintaining good CBG control to prevent long-term and short-term complications. Explained how hyperglycemia leads to damage within blood vessels which lead to the common complications seen with uncontrolled diabetes. Stressed to the patient the importance of improving glycemic control to prevent further complications from uncontrolled diabetes.  Pt with no questions at this time.  Thanks,  Tama Headings RN, MSN, BC-ADM Inpatient Diabetes Coordinator Team Pager [phone] (8a-5p)

## 2019-11-08 NOTE — Discharge Summary (Signed)
Name: Jonathan Kane MRN: [id number] DOB: October 23, 1945 74 y.o. 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   Accu-Chek Softclix Lancets lancets CHECK BLOOD SUGAR TWO TO THREE TIMES DAILY   aspirin EC 81 MG tablet Take 81 mg by mouth daily.   B-D SINGLE USE SWABS REGULAR Pads USE TO CHECK BLOOD SUGAR AS DIRECTED   cholecalciferol 25 MCG (1000 UNIT) tablet Commonly known as: VITAMIN D3 Take 1,000 Units by mouth daily.   diltiazem 180 MG 24 hr capsule Commonly known as: TIAZAC Take 180 mg by mouth daily.   Fifty50 Pen Needles 31G X 8 MM Misc Generic drug: Insulin Pen Needle Use as directed per sliding scale.  Dx Code: E11.621   GlucoCom Blood Glucose Monitor Devi Use as directed.  Dx Code: E11.621   hydrALAZINE 25 MG tablet Commonly known as: APRESOLINE Take 25 mg by mouth 3 (three) times daily.   insulin aspart 100 UNIT/ML FlexPen Commonly known as: NOVOLOG Inject 5 Units into the skin 3 (three) times daily. Before meals, up to 30 units/day   latanoprost 0.005 % ophthalmic solution Commonly known as: XALATAN Place 1 drop into both eyes at bedtime.   Magnesium 200 MG Tabs Take 200 mg by mouth daily.     nitroGLYCERIN 0.4 MG SL tablet Commonly known as: NITROSTAT Place 1 tablet (0.4 mg total) under the tongue every 5 (five) minutes as needed for chest pain.   ondansetron 4 MG tablet Commonly known as: ZOFRAN Take 4 mg by mouth every 8 (eight) hours as needed for nausea or vomiting.   Prevnar 13 Susp injection Generic drug: pneumococcal 13-valent conjugate vaccine Inject 0.5 mLs into the muscle once.   sildenafil 20 MG tablet Commonly known as: REVATIO Take 40-60 mg by mouth daily as needed.   simethicone 125 MG chewable tablet Commonly known as: MYLICON Chew 0000000 mg by mouth every 6 (six) hours as needed for flatulence.   sodium bicarbonate 650 MG tablet Take 1 tablet (650 mg total) by mouth 2 (two) times daily.   Tyler Aas FlexTouch 100 UNIT/ML Sopn FlexTouch Pen Generic drug: insulin degludec Inject 35 Units into the skin daily.       Disposition and follow-up:   JonathanJonathan Kane was discharged from [facility] in Good condition.  At the hospital follow up visit please address:  1.  Please review medication list with patient and ensure he is not taking nephrotoxic medications. Please continue to counsel patient on hemodialysis.   2.  Labs / imaging needed at time of follow-up: None   3.  Pending labs/ test needing follow-up: None   Follow-up Appointments: Follow-up Information  Myrtis Hopping., MD Follow up.   Specialty: Internal Medicine Why: Please make a follow up appointment with your primary care doctor within the next 2 weeks.  Contact information: [id number] [place] [address] [place], [facility] Follow up.   Specialty: [place] Why: The office will call next week to schedule therapy visits Contact information: [place] [address] [place] [address] [phone]           Hospital Course by problem list:  1. Acute metabolic encephalopathy: Mr.  Kane is a 74 yo male living with CKD Stage V who presented to the ED after an episode of altered mentation at home. This was thought to be secondary to uremia in the setting of worsening renal function. His symptoms resolved spontaneously. There were no urgent indications for hemodialysis during his stay. He was discharged home in stable condition and advised to make an appointment with his nephrologist to continue to discuss initiation of outpatient hemodialysis.   Medication changes:  START sodium bicarbonate 650 mg BID, STOP ibuprofen and flexeril   Discharge Vitals:   BP (!) 150/98 (BP Location: Right Arm)   Pulse 91   Temp 99.5 F (37.5 C) (Oral)   Resp 20   SpO2 99%   Pertinent Labs, Studies, and Procedures:  CBC    Component Value Date/Time   WBC 5.8 11/08/2019 0411   RBC 3.26 (L) 11/08/2019 0411   HGB 9.5 (L) 11/08/2019 0411   HCT 27.4 (L) 11/08/2019 0411   PLT 169 11/08/2019 0411   MCV 84.0 11/08/2019 0411   MCH 29.1 11/08/2019 0411   MCHC 34.7 11/08/2019 0411   RDW 12.7 11/08/2019 0411   LYMPHSABS 0.8 11/07/2019 0717   MONOABS 0.6 11/07/2019 0717   EOSABS 0.1 11/07/2019 0717   BASOSABS 0.1 11/07/2019 0717   BMP Latest Ref Rng & Units 11/08/2019 11/07/2019 07/03/2017  Glucose 70 - 99 mg/dL 145(H) 152(H) 118(H)  BUN 8 - 23 mg/dL 63(H) 64(H) 41(H)  Creatinine 0.61 - 1.24 mg/dL 6.57(H) 6.75(H) 2.13(H)  Sodium 135 - 145 mmol/L 140 142 141  Potassium 3.5 - 5.1 mmol/L 3.6 3.8 4.2  Chloride 98 - 111 mmol/L 110 108 108  CO2 22 - 32 mmol/L 20(L) 20(L) 25  Calcium 8.9 - 10.3 mg/dL 8.4(L) 8.6(L) 9.0   US Renal 11/06/19: IMPRESSION: 1. Normal ultrasound appearance of the kidneys. 2. 9 mm echogenic focus within the left aspect of the urinary bladder, possibly representing a small stone. 3. Enlarged prostate  Head CT 11/06/19: IMPRESSION: 1.  No acute findings. 2. 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 Moderate chronic ischemic  microvascular disease.  CXR 11/06/19: Heart size is exaggerated by low lung volumes. No edema or effusion is present. No focal airspace disease is present. Atherosclerotic changes are noted at the aortic arch.    Discharge Instructions: Discharge Instructions    Diet - low sodium heart healthy   Complete by: As directed    Discharge instructions   Complete by: As directed    You were admitted to the hospital due to confusion. This was due to uremia (build up of toxins in the body) which is caused by advanced kidney disease. This may continue to happen in the future. We recommend you talk to your regular doctor and your kidney doctor about hemodialysis. We started a new medication for you called sodium bicarbonate. Please take 1  tablet two times a day. Call us if you have any questions or concerns.   Increase activity slowly   Complete by: As directed       Signed: Welford Roche, MD 11/08/2019, 2:56 PM   Pager: [phone]

## 2019-11-08 NOTE — Progress Notes (Signed)
   Subjective:  Pt seen at the bedside this morning. States he is feeling much better this morning. Remembers getting "discombobulated" at home yesterday and his niece bringing him to the ED. 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  Discussed that his confusion was likely related to his kidneys failing and could potentially happen again. Pt expressed understanding, stating he had previously discussed transplant and dialysis with his nephrologist, and would call his nephrologist later today to schedule a hospital follow-up.  Objective:  Vital signs in last 24 hours: Vitals:   11/07/19 1326 11/07/19 1649 11/07/19 2027 11/08/19 0432  BP: (!) 167/106 (!) 172/97 (!) 163/98 (!) 150/98  Pulse: 90 84 85 91  Resp: 18 18 18 18   Temp: 97.7 F (36.5 C) 98 F (36.7 C) 98 F (36.7 C) 98.2 F (36.8 C)  TempSrc: Oral Oral  Oral  SpO2: 100% 100% 99% 99%   Physical Exam Vitals and nursing note reviewed.  Constitutional:      General: He is not in acute distress.    Appearance: He is not ill-appearing.  Pulmonary:     Effort: Pulmonary effort is normal.  Musculoskeletal:     Right lower leg: No edema.     Left lower leg: No edema.  Neurological:     Mental Status: He is alert and oriented to person, place, and time.  Psychiatric:        Mood and Affect: Mood normal.        Thought Content: Thought content normal.    Assessment/Plan:  Active Problems:   Acute renal failure (ARF) (HCC)  Acute on chronic renal failure Acute encephalopathy - resolved Pt admitted for acute metabolic encephalopathy associated with declining renal function. Ultrasound without signs of obstruction. - altered mental status has since resolved - pt oriented today and expressing understanding in his medical conditions - K 3.6 and BUN stable at 63 this AM - pt without hyperkalemia, acidosis, or volume overload to warrant urgent dialysis - discussed possibility for  encephalopathy to reoccur as pt's kidneys worsen and recommendation to avoid driving heavy machinery  Nephrology consulted - pt approaching HD but okay to resume outpatient management - okay for discharge - avoid NSAIDs at discharge - close follow-up with his nephrologist at [facility]  HTN - continue home meds  Type II Diabetes  - resume home insulin regimen  Prior to Admission Living Arrangement: Home Anticipated Discharge Location: Home with [facility]  Dispo: Anticipated discharge today.  Ladona Horns, MD 11/08/2019, 7:48 AM Pager: [phone]

## 2019-11-08 NOTE — Care Management Obs Status (Signed)
[place] NOTIFICATION   Patient Details  Name: Jonathan Kane MRN: [id number] Date of Birth: [birth year]   Medicare Observation Status Notification Given:  Yes    Bartholomew Crews, RN 11/08/2019, 11:23 AM

## 2019-11-08 NOTE — Care Management CC44 (Signed)
Condition Code 44 Documentation Completed  Patient Details  Name: Jonathan Kane MRN: [id number] Date of Birth: October 09, 1946   Condition Code 44 given:  Yes Patient signature on Condition Code 44 notice:  Yes Documentation of 2 MD's agreement:  Yes Code 44 added to claim:  Yes    Bartholomew Crews, RN 11/08/2019, 11:24 AM

## 2019-11-08 NOTE — Plan of Care (Signed)
  Problem: Education: Goal: Knowledge of General Education information will improve Description Including pain rating scale, medication(s)/side effects and non-pharmacologic comfort measures Outcome: Progressing   

## 2019-11-08 NOTE — Progress Notes (Signed)
DISCHARGE NOTE HOME BADEN LEAL to be discharged Home     per MD order. Discussed prescriptions and follow up appointments with the patient. Prescriptions given to patient; medication list explained in detail. Patient verbalized understanding.  Skin clean, dry and intact without evidence of skin break down, no evidence of skin tears noted. IV catheter discontinued intact. Site without signs and symptoms of complications. Dressing and pressure applied. Pt denies pain at the site currently. No complaints noted.  Patient free of lines, drains, and wounds.   An After Visit Summary (AVS) was printed and given to the patient. Patient escorted via wheelchair, and discharged home via private auto.  Paulla Fore, RN

## 2019-11-08 NOTE — Plan of Care (Signed)
  Problem: Activity: Goal: Risk for activity intolerance will decrease Outcome: Progressing   

## 2019-11-08 NOTE — Progress Notes (Signed)
Admit: 11/07/2019 LOS: 1  54F CKD5 and AMS  Subjective:  . No overnight events . Feels well, taking PO, AAO x3  . No N/V/dysgeusia, LEE, hiccups . Has been following with [facility]   01/21 0701 - 01/22 0700 In: 2349.1 [P.O.:300; IV Piggyback:1949.1] Out: 500 [Urine:500]  There were no vitals filed for this visit.  Scheduled Meds: . heparin  5,000 Units Subcutaneous Q8H  . hydrALAZINE  25 mg Oral TID  . insulin aspart  0-15 Units Subcutaneous TID WC  . insulin aspart  0-5 Units Subcutaneous QHS  . insulin glargine  15 Units Subcutaneous QHS  . latanoprost  1 drop Both Eyes QHS  . sodium bicarbonate  650 mg Oral BID  . sodium chloride flush  3 mL Intravenous Q12H   Continuous Infusions: PRN Meds:.acetaminophen **OR** acetaminophen  Current Labs: reviewed    Physical Exam:  Blood pressure (!) 150/98, pulse 91, temperature 99.5 F (37.5 C), temperature source Oral, resp. rate 20, SpO2 99 %. NAD No asterixus, AAO x3 No rub, nl s1s2 regular CTAB No sig LEE S/nt/nd EOMI  A 1. CKD5, DPN.  Approachign HD but ok to resuem outpt mgmt with [facility] 2. AMS, resolved 3. HTN, stable, cont outpt meds 4. Anemia, mild, FE replete, outpt mgmt 5. CKDBMD Ca and P ok. Recent PTH noted 6. Mebabolic acidosis on NaHCO3, stable  P . OK for DC . No NSAIDs at DC . He knows to f/u with his nephrologist for further RRT planning / AVF eval . Will s/o for now, call with any questions   Pearson Grippe MD 11/08/2019, 11:37 AM  Recent Labs  Lab 11/07/19 0717 11/08/19 0411  NA 142 140  K 3.8 3.6  CL 108 110  CO2 20* 20*  GLUCOSE 152* 145*  BUN 64* 63*  CREATININE 6.75* 6.57*  CALCIUM 8.6* 8.4*  PHOS  --  5.2*   Recent Labs  Lab 11/07/19 0717 11/08/19 0411  WBC 7.8 5.8  NEUTROABS 6.3  --   HGB 9.6* 9.5*  HCT 28.5* 27.4*  MCV 85.8 84.0  PLT 182 169

## 2019-11-08 NOTE — TOC Initial Note (Signed)
Transition of Care [facility]) - Initial/Assessment Note    Patient Details  Name: Jonathan Kane MRN: [id number] Date of Birth: [birth year]  [facility]) CM/SW Contact:    Bartholomew Crews, RN Phone Number: [phone] 11/08/2019, 1:39 PM  Clinical Narrative:                 Spoke with patient at the bedside. PTA home alone. Independent. Drives. PCP updated to Novella Rob, FNP at [facility]. Discussed home health recommendations for PT and OT. Patient agreeable. Offered choice for home health, and asked NCM to talk to his daughter, Clarene Critchley. Spoke with Clarene Critchley to discuss choice. Currently referral is pending with [place]. Patient to transition home today. Clarene Critchley will pick up after work. TOC following.   Expected Discharge Plan: [place] Barriers to Discharge: No Barriers Identified   Patient Goals and CMS Choice Patient states their goals for this hospitalization and ongoing recovery are:: return home CMS Medicare.gov Compare Post Acute Care list provided to:: Patient Choice offered to / list presented to : Patient, Adult Children  Expected Discharge Plan and Services Expected Discharge Plan: [place] In-house Referral: NA Discharge Planning Services: CM Consult Post Acute Care Choice: [place] arrangements for the past 2 months: Single Family Home Expected Discharge Date: 11/08/19               DME Arranged: N/A DME Agency: NA       HH Arranged: PT, OT [place] Agency: [place] ([place]) Date [place]: 11/08/19 Time [place]: [id number] Representative spoke with at [place]: Butch Penny - pending referral  Prior Living Arrangements/Services Living arrangements for the past 2 months: Single Family Home Lives with:: Self Patient language and need for interpreter reviewed:: Yes Do you feel safe going back to the place where you live?: Yes      Need for Family Participation in Patient  Care: Yes (Comment) Care giver support system in place?: Yes (comment)   Criminal Activity/Legal Involvement Pertinent to Current Situation/Hospitalization: No - Comment as needed  Activities of Daily Living Home Assistive Devices/Equipment: None ADL Screening (condition at time of admission) Patient's cognitive ability adequate to safely complete daily activities?: Yes Is the patient deaf or have difficulty hearing?: Yes Does the patient have difficulty seeing, even when wearing glasses/contacts?: No Does the patient have difficulty concentrating, remembering, or making decisions?: No Patient able to express need for assistance with ADLs?: Yes Does the patient have difficulty dressing or bathing?: No Independently performs ADLs?: Yes (appropriate for developmental age) Does the patient have difficulty walking or climbing stairs?: Yes Weakness of Legs: Both Weakness of Arms/Hands: None  Permission Sought/Granted Permission sought to share information with : Family Supports Permission granted to share information with : Yes, Verbal Permission Granted  Share Information with NAME: Trayveon Hagemeier     Permission granted to share info w Relationship: daughter  Permission granted to share info w Contact Information: [phone]  Emotional Assessment Appearance:: Appears stated age Attitude/Demeanor/Rapport: Engaged Affect (typically observed): Accepting Orientation: : Oriented to Self, Oriented to  Time, Oriented to Place, Oriented to Situation Alcohol / Substance Use: Not Applicable Psych Involvement: No (comment)  Admission diagnosis:  Acute renal failure (ARF) (HCC) [N17.9] Acute renal failure, unspecified acute renal failure type [facility]) [N17.9] Patient Active Problem List   Diagnosis Date Noted  . Acute metabolic encephalopathy 99991111  . Acute on chronic renal failure ([place]) 11/07/2019  .  CKD (chronic kidney disease) stage 5, GFR less than 15 ml/min (HCC) 09/02/2019  .  CKD (chronic kidney disease) stage 4, GFR 15-29 ml/min (HCC) 08/01/2019  . Paronychia of fifth toe of left foot 05/29/2019  . Microalbuminuria due to type 2 diabetes mellitus ([place]) 02/14/2019  . Posterior vitreous detachment of both eyes 01/17/2019  . Presbyopia of both eyes 01/17/2019  . Pseudophakia of both eyes 01/17/2019  . Abnormal finding on diagnostic imaging of right testicle 09/14/2018  . Epididymitis, left 09/14/2018  . Swelling of left testicle 08/23/2018  . Primary open angle glaucoma (POAG) of both eyes, mild stage 01/02/2018  . Benign hypertension with chronic kidney disease, stage V ([place]) 11/30/2017  . Proteinuria 11/30/2017  . Mouth burn, initial encounter 11/10/2017  . Vitamin D deficiency, unspecified 11/10/2017  . Erectile dysfunction associated with type 2 diabetes mellitus ([place]) 08/29/2017  . 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 Hypoglycemia 08/16/2017  . PCO (posterior capsular opacification), left 03/03/2017  . Hypermetropia of both eyes 03/03/2017  . Cocaine dependence in remission ([place]) 07/26/2016  . Herpes simplex infection 07/26/2016  . CKD stage 4 due to type 2 diabetes mellitus ([place]) 07/07/2016  . Noncompliance with diabetes treatment 07/07/2016  . 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 Combined arterial insufficiency and corporo-venous occlusive erectile dysfunction 03/04/2016  . Allergic rhinitis with postnasal drip 02/24/2016  . 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  . Type 2 diabetes mellitus ([place]) 02/23/2016  . Essential hypertension 02/23/2016  . Musculoskeletal chest pain 02/23/2016  . 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[place]) 12/09/2015  . Polyneuropathy, unspecified 11/12/2013  . Constipation 03/23/2013  . Hallucinations 03/23/2013  . Polyneuropathy in diabetes ([place]) 03/23/2013  . 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[place])  08/30/2011  . Diabetes mellitus type II, controlled ([place]) 08/30/2011  . 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   PCP:  Myrtis Hopping., MD Pharmacy:   [facility] [address] [place] [address] Phone: [phone] Fax: [phone]     Social Determinants of Health (SDOH) Interventions    Readmission Risk Interventions No flowsheet data found.

## 2019-12-26 DIAGNOSIS — E872 Acidosis, unspecified: Secondary | ICD-10-CM | POA: Insufficient documentation

## 2019-12-26 DIAGNOSIS — N2581 Secondary hyperparathyroidism of renal origin: Secondary | ICD-10-CM | POA: Insufficient documentation

## 2020-03-18 ENCOUNTER — Other Ambulatory Visit (HOSPITAL_COMMUNITY): Payer: Self-pay | Admitting: *Deleted

## 2020-03-19 ENCOUNTER — Inpatient Hospital Stay (HOSPITAL_COMMUNITY)
Admission: RE | Admit: 2020-03-19 | Discharge: 2020-03-19 | Disposition: A | Discharge status: Home / Self Care | Payer: Medicare Other | Source: Ambulatory Visit | Attending: Nephrology | Admitting: Nephrology

## 2020-03-19 ENCOUNTER — Encounter (HOSPITAL_COMMUNITY): Payer: Self-pay

## 2020-04-02 ENCOUNTER — Encounter (HOSPITAL_COMMUNITY): Payer: Medicare (Managed Care)

## 2020-05-18 ENCOUNTER — Other Ambulatory Visit (HOSPITAL_COMMUNITY): Payer: Self-pay | Admitting: Student

## 2020-05-18 DIAGNOSIS — I120 Hypertensive chronic kidney disease with stage 5 chronic kidney disease or end stage renal disease: Secondary | ICD-10-CM

## 2020-05-19 ENCOUNTER — Ambulatory Visit
Admission: RE | Admit: 2020-05-19 | Discharge: 2020-05-19 | Disposition: A | Discharge status: Home / Self Care | Payer: Medicare Other | Source: Ambulatory Visit | Attending: Student | Admitting: Student

## 2020-05-19 ENCOUNTER — Other Ambulatory Visit: Payer: Self-pay | Admitting: Student

## 2020-05-19 DIAGNOSIS — M25552 Pain in left hip: Secondary | ICD-10-CM

## 2020-05-19 IMAGING — CR DG HIP (WITH OR WITHOUT PELVIS) 2-3V*L*
2 series · 2 of 2 positions shown · non-contrast
Comparison: None.

CLINICAL DATA: Left hip pain

EXAM:
DG HIP (WITH OR WITHOUT PELVIS) 2-3V LEFT

[t pelvis a.p.]
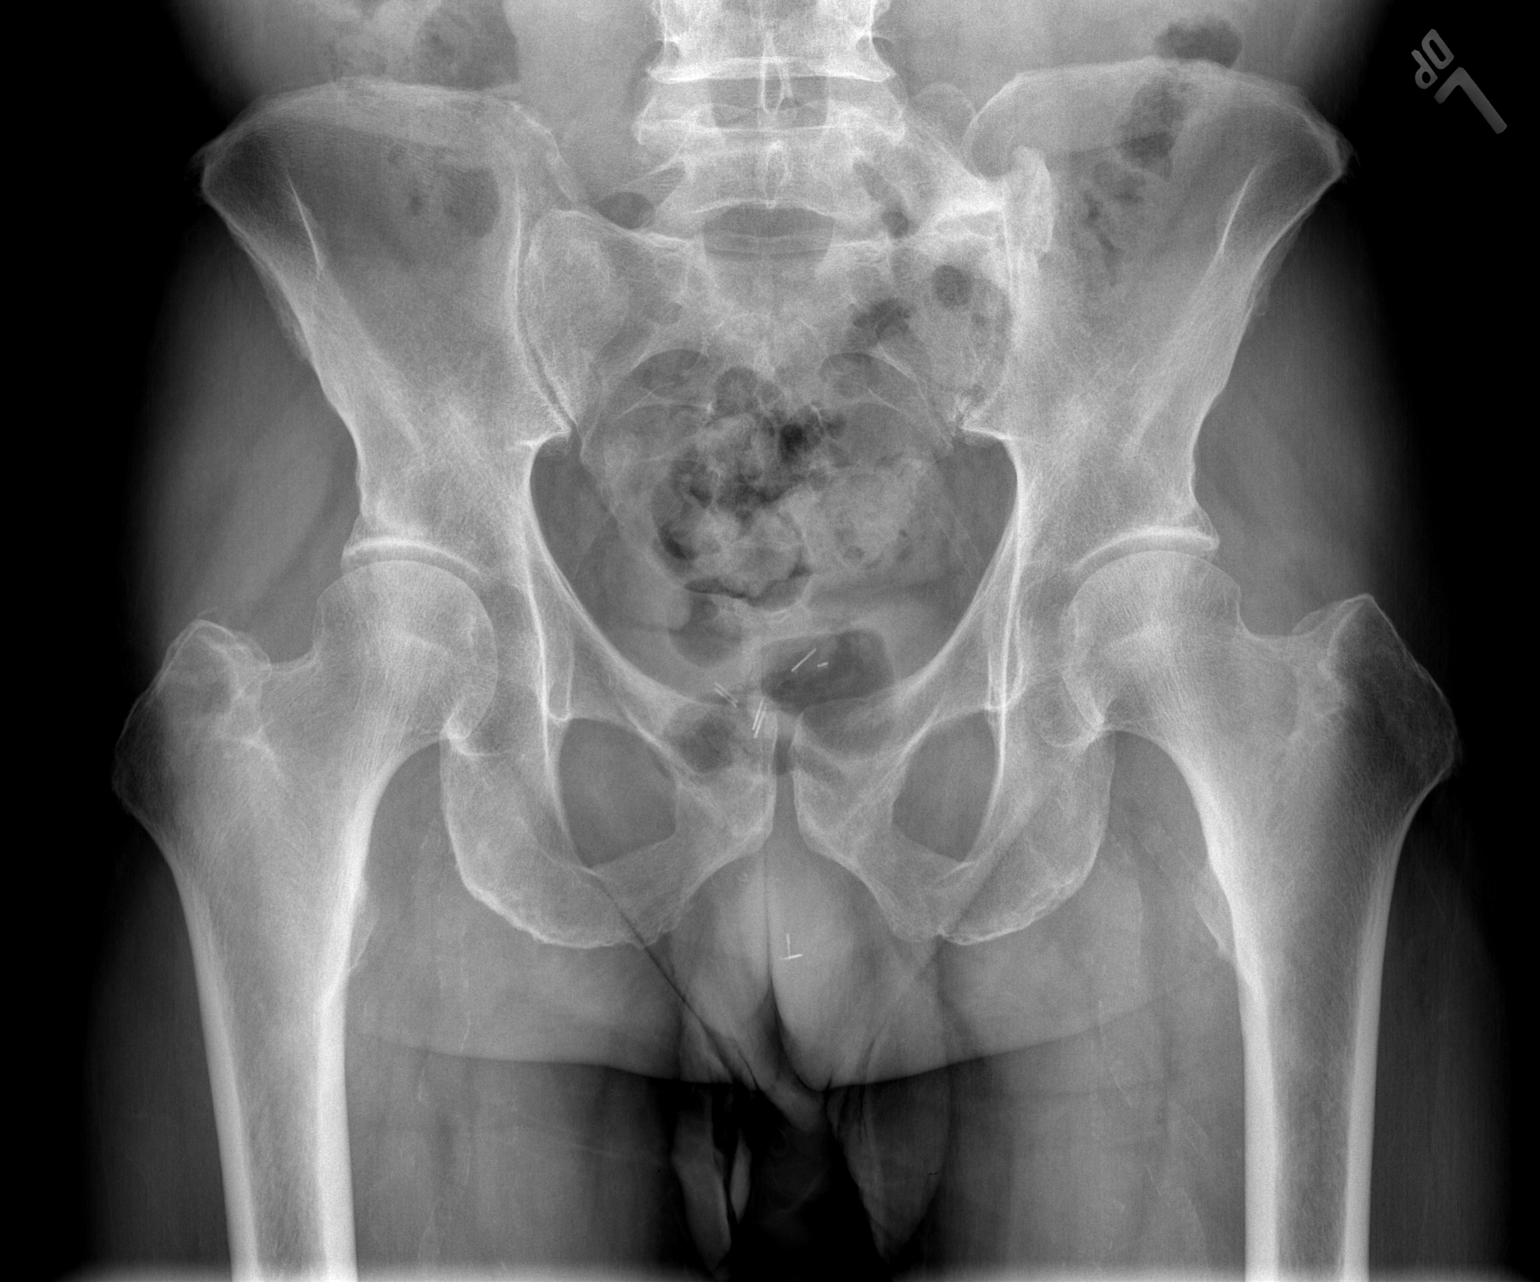

[t hip frog leg left]
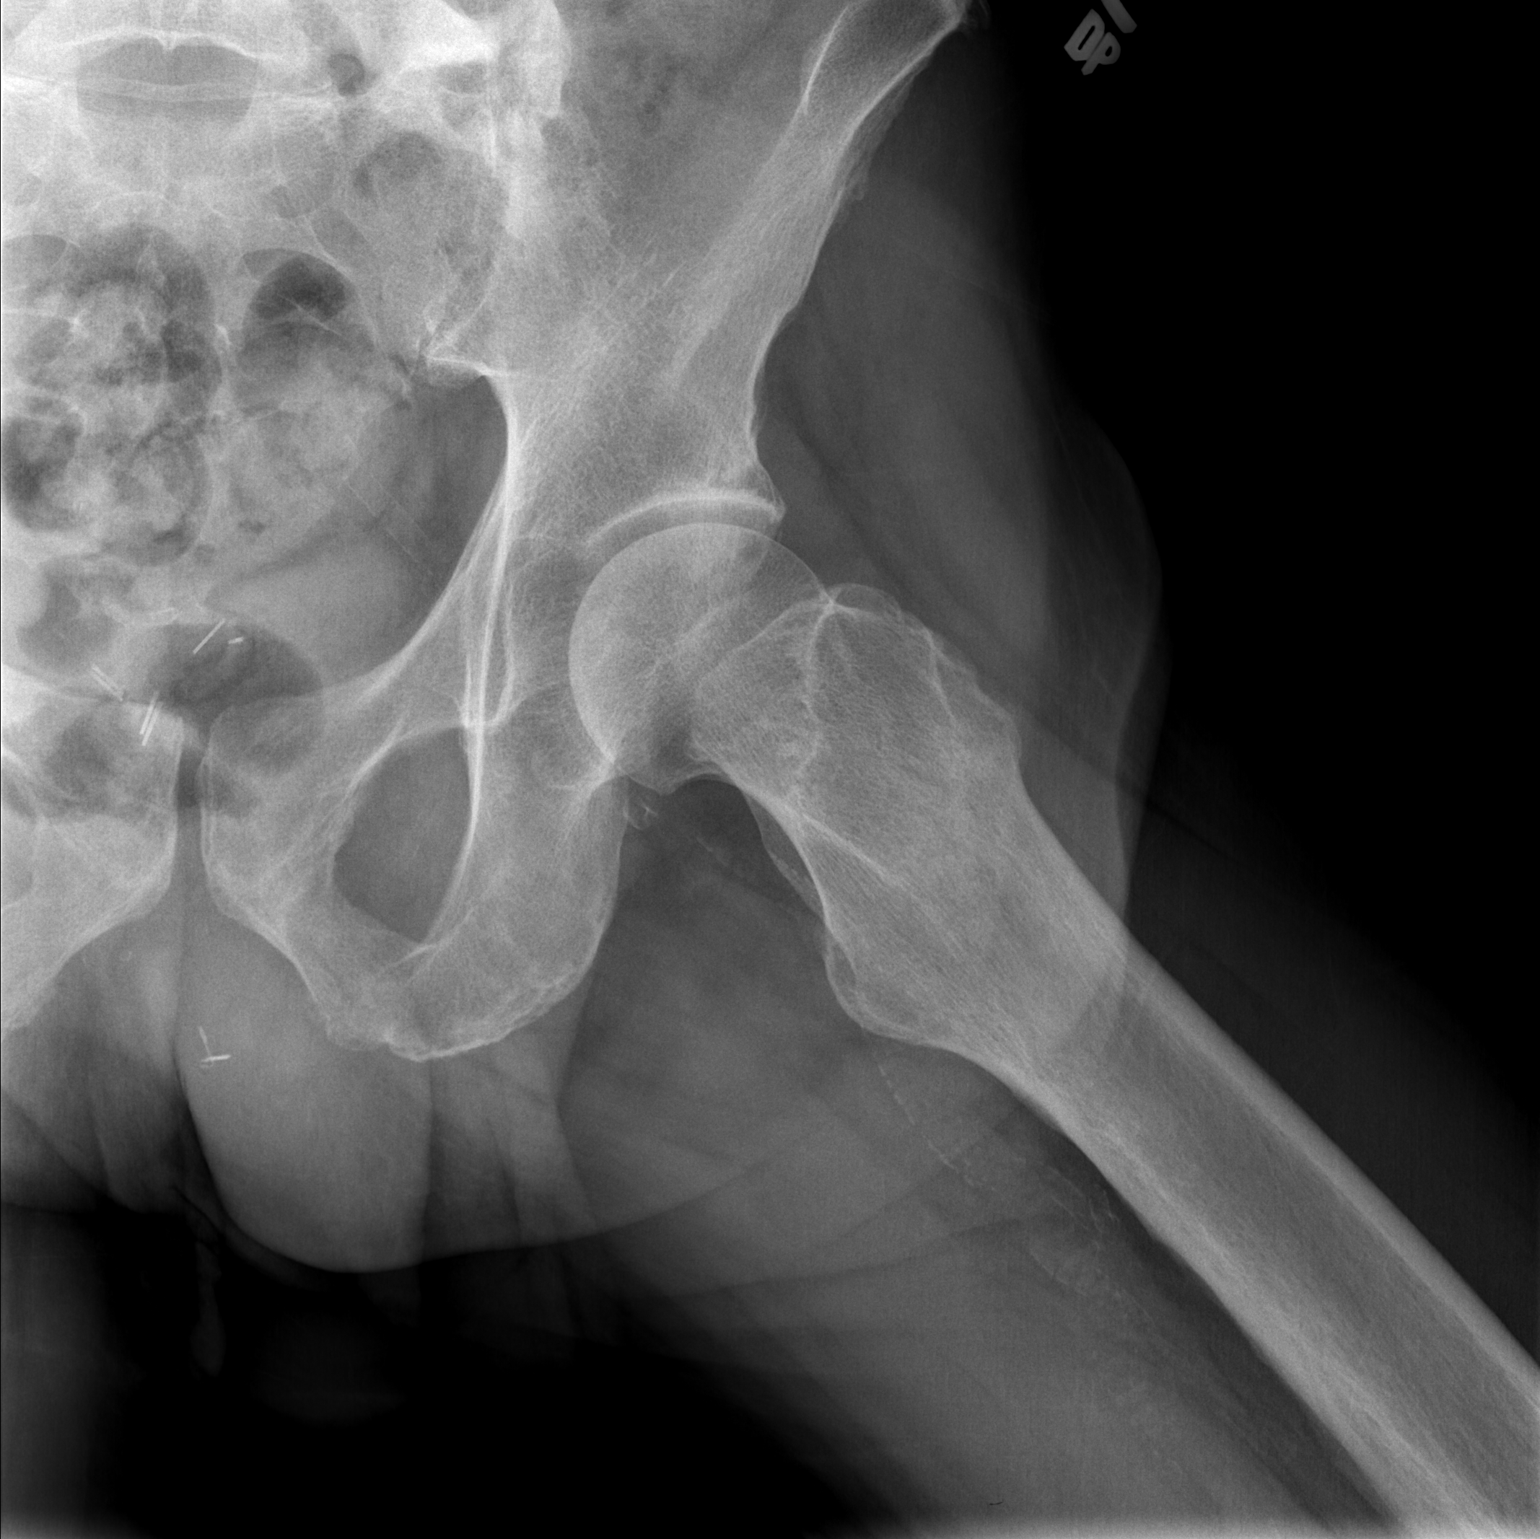

[2 of 2 positions shown; findings below may reference images not displayed]

FINDINGS: Alignment is anatomic. No acute fracture. Joint spaces are
preserved. There is no intrinsic osseous lesion. Surgical clips
overlie the pelvis. Vascular calcification is noted. Probable
lumbosacral transitional anatomy with pseudoarthrosis of the left L5
transverse process with sacrum.
IMPRESSION: No significant abnormality of the left hip.

## 2020-05-28 ENCOUNTER — Ambulatory Visit (HOSPITAL_COMMUNITY): Payer: Medicare Other | Attending: Cardiology

## 2020-05-28 ENCOUNTER — Other Ambulatory Visit: Payer: Self-pay

## 2020-05-28 DIAGNOSIS — I120 Hypertensive chronic kidney disease with stage 5 chronic kidney disease or end stage renal disease: Secondary | ICD-10-CM | POA: Insufficient documentation

## 2020-05-28 LAB — ECHOCARDIOGRAM COMPLETE
Area-P 1/2: 4.06 cm2
S' Lateral: 3.1 cm

## 2020-06-05 ENCOUNTER — Other Ambulatory Visit (HOSPITAL_COMMUNITY): Payer: Self-pay | Admitting: Nephrology

## 2020-06-05 DIAGNOSIS — Z992 Dependence on renal dialysis: Secondary | ICD-10-CM

## 2020-06-05 DIAGNOSIS — N186 End stage renal disease: Secondary | ICD-10-CM

## 2020-06-09 ENCOUNTER — Ambulatory Visit: Payer: Self-pay | Admitting: Podiatry

## 2020-06-10 ENCOUNTER — Other Ambulatory Visit: Payer: Self-pay | Admitting: Physician Assistant

## 2020-06-11 ENCOUNTER — Ambulatory Visit (HOSPITAL_COMMUNITY)
Admission: RE | Admit: 2020-06-11 | Discharge: 2020-06-11 | Disposition: A | Discharge status: Home / Self Care | Payer: Medicare Other | Source: Ambulatory Visit | Attending: Nephrology | Admitting: Nephrology

## 2020-06-19 ENCOUNTER — Other Ambulatory Visit: Payer: Self-pay

## 2020-06-19 ENCOUNTER — Encounter (HOSPITAL_COMMUNITY): Payer: Self-pay | Admitting: Emergency Medicine

## 2020-06-19 ENCOUNTER — Emergency Department (HOSPITAL_COMMUNITY)
Admission: EM | Admit: 2020-06-19 | Discharge: 2020-06-20 | Disposition: A | Discharge status: Home / Self Care | Payer: Medicare Other | Attending: Emergency Medicine | Admitting: Emergency Medicine

## 2020-06-19 DIAGNOSIS — Z992 Dependence on renal dialysis: Secondary | ICD-10-CM | POA: Diagnosis present

## 2020-06-19 DIAGNOSIS — Z5321 Procedure and treatment not carried out due to patient leaving prior to being seen by health care provider: Secondary | ICD-10-CM | POA: Diagnosis not present

## 2020-06-19 DIAGNOSIS — T82838A Hemorrhage of vascular prosthetic devices, implants and grafts, initial encounter: Secondary | ICD-10-CM | POA: Insufficient documentation

## 2020-06-19 NOTE — ED Triage Notes (Signed)
Patient reports bleeding at hemodialysis graft after treatment this evening , no bleeding at arrival , dressing applied at triage .

## 2020-06-19 NOTE — ED Notes (Signed)
Pt states he is leaving °

## 2020-06-24 ENCOUNTER — Other Ambulatory Visit: Payer: Self-pay | Admitting: Student

## 2020-06-25 ENCOUNTER — Other Ambulatory Visit: Payer: Self-pay

## 2020-06-25 ENCOUNTER — Encounter: Payer: Self-pay | Admitting: Podiatry

## 2020-06-25 ENCOUNTER — Ambulatory Visit (INDEPENDENT_AMBULATORY_CARE_PROVIDER_SITE_OTHER): Payer: Medicare Other | Admitting: Podiatry

## 2020-06-25 ENCOUNTER — Encounter (HOSPITAL_COMMUNITY): Payer: Self-pay

## 2020-06-25 ENCOUNTER — Ambulatory Visit (HOSPITAL_COMMUNITY)
Admission: RE | Admit: 2020-06-25 | Discharge: 2020-06-25 | Disposition: A | Discharge status: Home / Self Care | Payer: Medicare Other | Source: Ambulatory Visit | Attending: Nephrology | Admitting: Nephrology

## 2020-06-25 DIAGNOSIS — M2042 Other hammer toe(s) (acquired), left foot: Secondary | ICD-10-CM

## 2020-06-25 DIAGNOSIS — Z7982 Long term (current) use of aspirin: Secondary | ICD-10-CM | POA: Insufficient documentation

## 2020-06-25 DIAGNOSIS — Y841 Kidney dialysis as the cause of abnormal reaction of the patient, or of later complication, without mention of misadventure at the time of the procedure: Secondary | ICD-10-CM | POA: Insufficient documentation

## 2020-06-25 DIAGNOSIS — I120 Hypertensive chronic kidney disease with stage 5 chronic kidney disease or end stage renal disease: Secondary | ICD-10-CM | POA: Insufficient documentation

## 2020-06-25 DIAGNOSIS — M2012 Hallux valgus (acquired), left foot: Secondary | ICD-10-CM

## 2020-06-25 DIAGNOSIS — B353 Tinea pedis: Secondary | ICD-10-CM

## 2020-06-25 DIAGNOSIS — N185 Chronic kidney disease, stage 5: Secondary | ICD-10-CM | POA: Diagnosis not present

## 2020-06-25 DIAGNOSIS — E1129 Type 2 diabetes mellitus with other diabetic kidney complication: Secondary | ICD-10-CM

## 2020-06-25 DIAGNOSIS — Z992 Dependence on renal dialysis: Secondary | ICD-10-CM | POA: Insufficient documentation

## 2020-06-25 DIAGNOSIS — T82510A Breakdown (mechanical) of surgically created arteriovenous fistula, initial encounter: Secondary | ICD-10-CM | POA: Insufficient documentation

## 2020-06-25 DIAGNOSIS — N186 End stage renal disease: Secondary | ICD-10-CM | POA: Diagnosis not present

## 2020-06-25 DIAGNOSIS — Z794 Long term (current) use of insulin: Secondary | ICD-10-CM | POA: Insufficient documentation

## 2020-06-25 DIAGNOSIS — E1122 Type 2 diabetes mellitus with diabetic chronic kidney disease: Secondary | ICD-10-CM | POA: Insufficient documentation

## 2020-06-25 DIAGNOSIS — IMO0002 Reserved for concepts with insufficient information to code with codable children: Secondary | ICD-10-CM

## 2020-06-25 DIAGNOSIS — M21612 Bunion of left foot: Secondary | ICD-10-CM

## 2020-06-25 DIAGNOSIS — L84 Corns and callosities: Secondary | ICD-10-CM | POA: Diagnosis not present

## 2020-06-25 DIAGNOSIS — E1165 Type 2 diabetes mellitus with hyperglycemia: Secondary | ICD-10-CM

## 2020-06-25 DIAGNOSIS — Z79899 Other long term (current) drug therapy: Secondary | ICD-10-CM | POA: Insufficient documentation

## 2020-06-25 DIAGNOSIS — Z87891 Personal history of nicotine dependence: Secondary | ICD-10-CM | POA: Diagnosis not present

## 2020-06-25 HISTORY — PX: IR DIALY SHUNT INTRO NEEDLE/INTRACATH INITIAL W/IMG LEFT: IMG6102

## 2020-06-25 IMAGING — US IR DAILY SHUNT INTRO NEEDLE/INTRACATH INITIAL W/IMG*L*
6 series · 13 of 24 positions shown · non-contrast
Comparison: None.

INDICATION: Decreased flows while at dialysis.

[Series 1: body 4 care · 2 of 5 slices shown (1 of 5)]
[im 1/5]
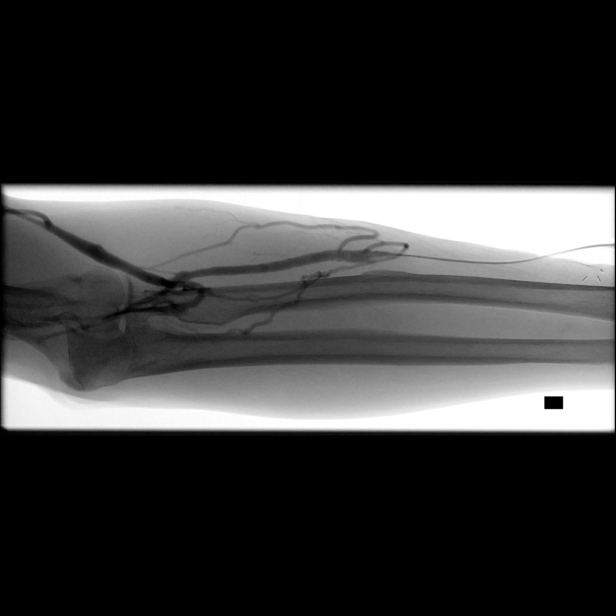
[im 3/5]
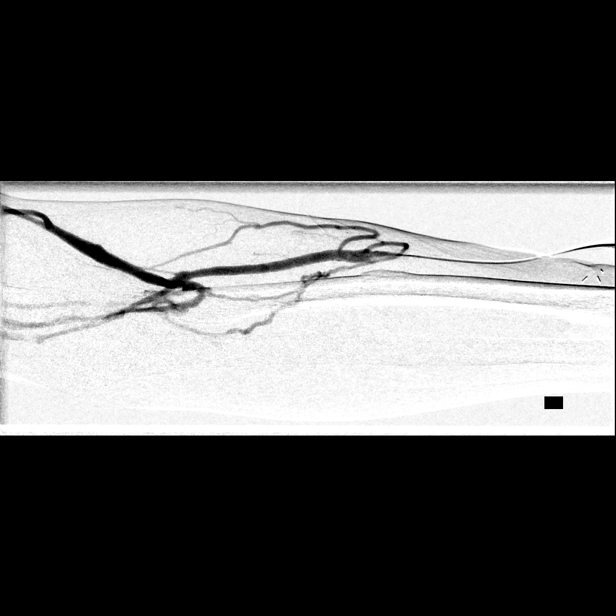

[Series 1: ir daily shunt intro needle/intracath initial w/im · 1 of 3 slices shown]
[im 1/3]
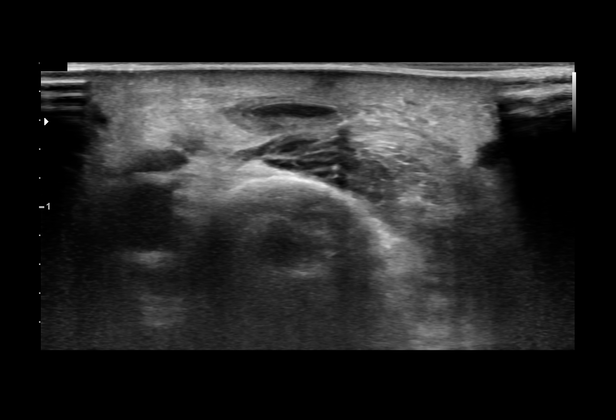

[Series 2: body 4 care · 2 of 5 slices shown (2 of 5)]
[im 1/5]
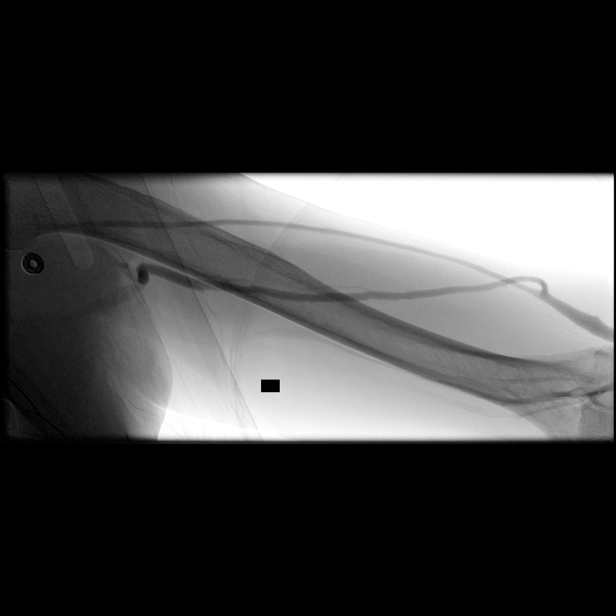
[im 3/5]
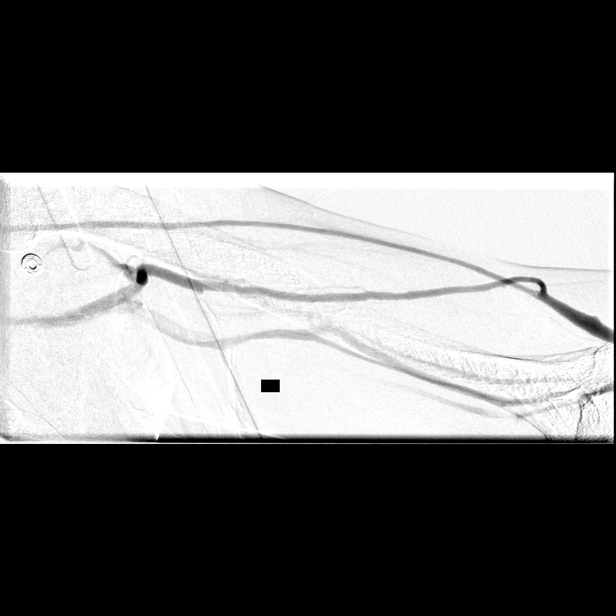

[Series 3: body 4 care · 3 of 5 slices shown (3 of 5)]
[im 1/5]
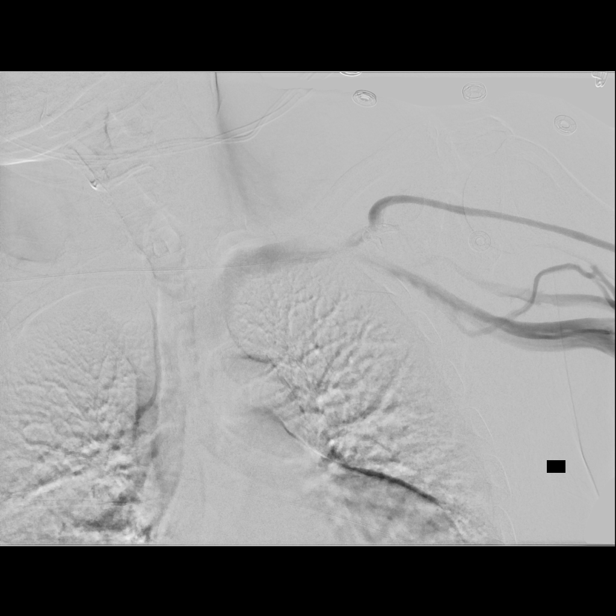
[im 3/5]
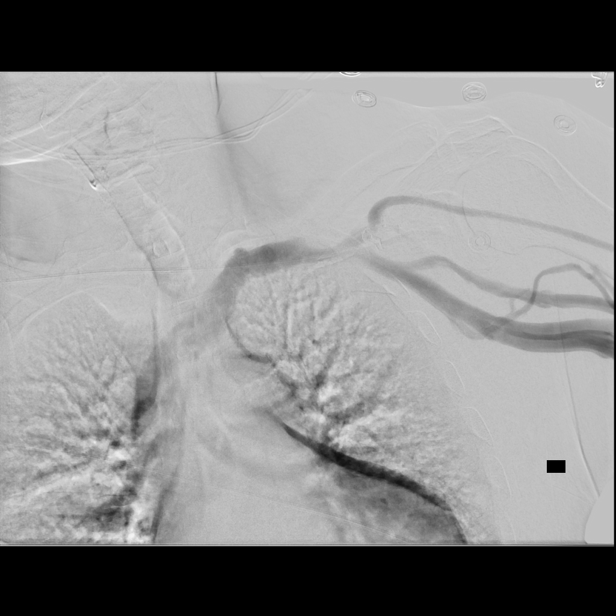
[im 5/5]
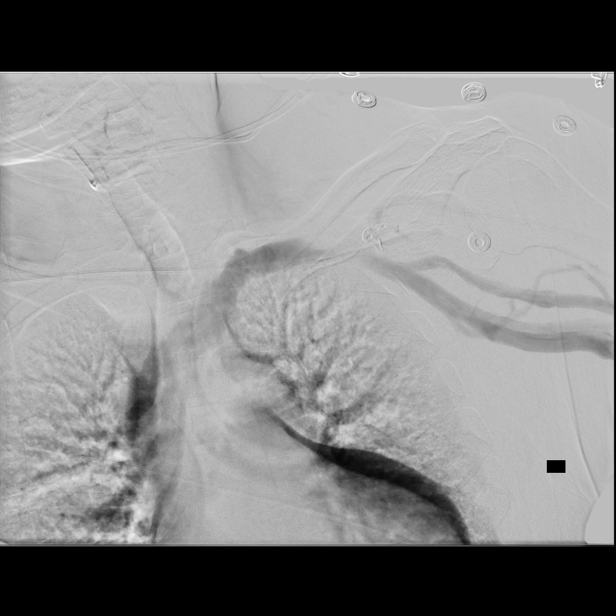

[Series 4: body 4 care · 1 of 4 slices shown (4 of 5)]
[im 2/4]
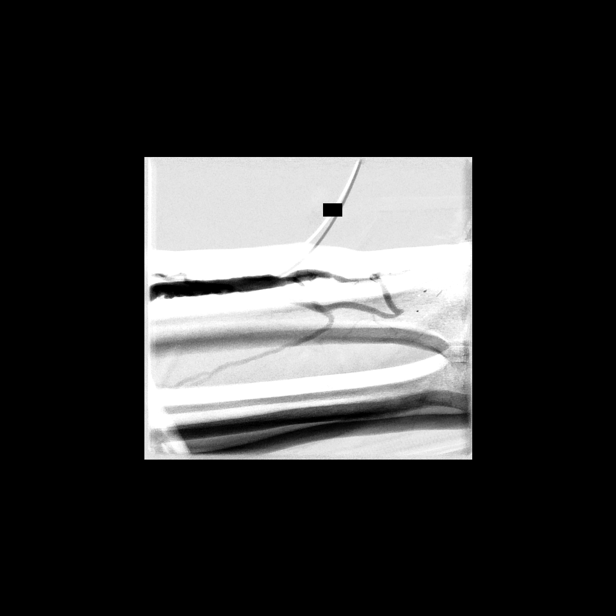

[Series 5: body 4 care · 4 of 9 slices shown (5 of 5)]
[im 1/9]
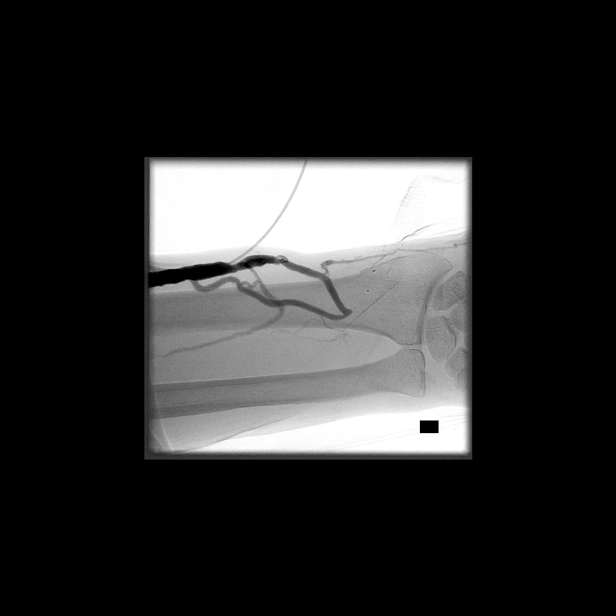
[im 3/9]
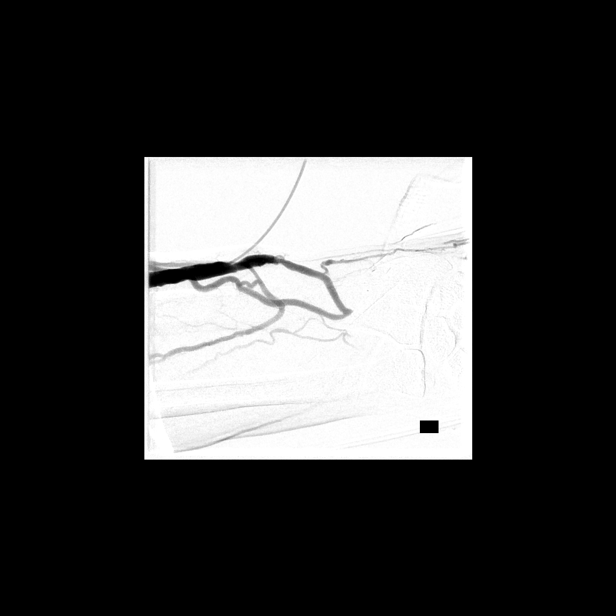
[im 6/9]
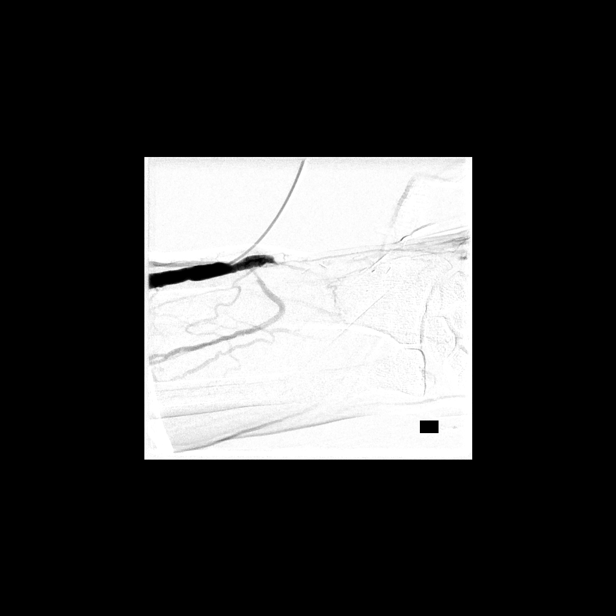
[im 9/9]
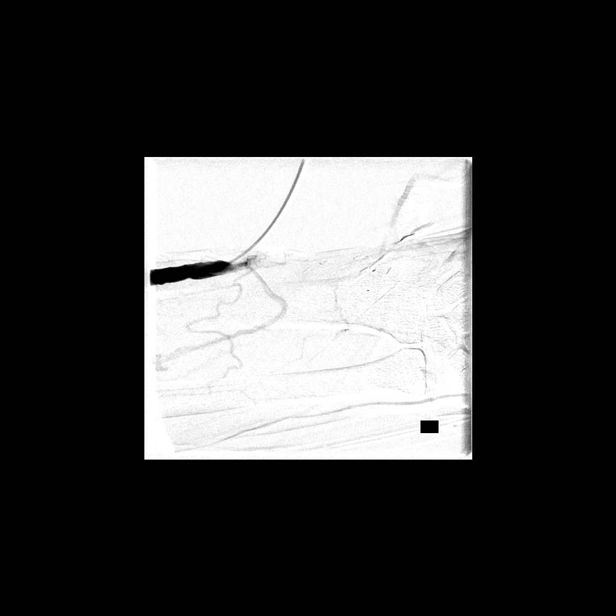

[13 of 24 positions shown; findings below may reference images not displayed]

The patient's left forearm radial-cephalic AV fistula was created
approximately 5-6 months ago and has been successfully utilized for
dialysis for the past 2-3 months however there is difficulty with
cannulation of the fistula during last attempted dialysis session.

As such, patient presents today for diagnostic fistulagram with
potential intervention.

EXAM:
1. ULTRASOUND GUIDANCE FOR FISTULA ACCESS
2. DIALYSIS AV SHUNTOGRAM/FISTULAGRAM
- this is the first intervention on the patient's
left forearm AV fistula

MEDICATIONS:
None.

CONTRAST:  30mL OMNIPAQUE IOHEXOL 300 MG/ML  SOLN

ANESTHESIA/SEDATION:
None

FLUOROSCOPY TIME:  24 seconds (7 mGy)

COMPLICATIONS:
None immediate.

PROCEDURE:
Sonographic evaluation was performed of the left distal forearm AV
fistula from the level of the anastomosis to the proximal forearm.

The skin overlying the left forearm dialysis fistula was prepped and
draped in usual sterile fashion. A sterile drape was applied
covering the operative field.

Under direct ultrasound guidance, the fistula was accessed directed
towards the venous outflow with a micropuncture kit after the
adjacent soft tissues were anesthetized with 1% lidocaine. An
ultrasound image was saved for documentation purposes.

Next, venous drainage was assessed to the level of the central veins
in the chest through the outer sheath of the micropuncture sheath.
Proximal shunt was studied by reflux maneuver with temporary
compression of venous outflow.

Images reviewed and discussed with the patient.

A micropuncture sheath was removed and hemostasis was achieved with
manual compression. Dressing was applied. The patient tolerated the
procedure well without immediate post procedural complication.
FINDINGS: Sonographic evaluation of the left forearm AV fistula demonstrates
wide patency of the arterial limb and venous outflow to the level of
the proximal forearm.

Diagnostic fistulagram demonstrates patency of the cephalic vein to
the level of the antecubital fossa. There is filling of several
hypertrophied venous collaterals at the level of the proximal
forearm with venous drainage both through the cephalic vein as well
as into the deep venous system at the level of the left upper arm.
The cephalic vein is patent through the level of the cephalic arch.

The central venous system of the left upper extremity is widely
patent to the level of the superior cavoatrial junction.

Reflux fistulagram demonstrates development of several hypertrophied
venous collaterals at the level of the anastomosis. Despite two
attempts, the arterial limb of the fistula as well as the
anastomosis could not be opacified secondary to the hypertrophied
venous collaterals, however these were found to be widely patent
with sonographic evaluation.
IMPRESSION: 1. The left distal radial-cephalic AV fistula is widely patent,
however there are hypertrophied venous collaterals at the level of
the anastomosis as well as the proximal forearm which may be
resulting in suboptimal maturation of the AV fistula.
2. Otherwise, no explanation for difficulty cannulating the left
forearm AV fistula.

ACCESS:
This access remains amenable to future percutaneous interventions as
clinically indicated however if cannulation continues to prove
difficult, surgical revision may ultimately be required.

## 2020-06-25 MED ORDER — CEFAZOLIN SODIUM-DEXTROSE 2-4 GM/100ML-% IV SOLN: 2.0000 g | Freq: Once | INTRAVENOUS | Status: DC

## 2020-06-25 MED ORDER — SODIUM CHLORIDE 0.9 % IV SOLN: INTRAVENOUS | Status: DC

## 2020-06-25 MED ORDER — CEFAZOLIN SODIUM-DEXTROSE 2-4 GM/100ML-% IV SOLN
INTRAVENOUS | Status: AC
  Filled 2020-06-25: qty 100

## 2020-06-25 MED ORDER — IOHEXOL 300 MG/ML  SOLN
100.0000 mL | Freq: Once | INTRAMUSCULAR | Status: AC | PRN
  Administered 2020-06-25: 30 mL via INTRAVENOUS

## 2020-06-25 MED ORDER — MIDAZOLAM HCL 2 MG/2ML IJ SOLN
INTRAMUSCULAR | Status: AC
  Filled 2020-06-25: qty 2

## 2020-06-25 MED ORDER — FENTANYL CITRATE (PF) 100 MCG/2ML IJ SOLN
INTRAMUSCULAR | Status: DC
Start: 2020-06-25 — End: 2020-06-25
  Filled 2020-06-25: qty 2

## 2020-06-25 MED ORDER — NAFTIFINE HCL 2 % EX GEL: CUTANEOUS | 2 refills | Status: DC

## 2020-06-25 MED ORDER — LIDOCAINE HCL 1 % IJ SOLN
INTRAMUSCULAR | Status: AC
  Filled 2020-06-25: qty 20

## 2020-06-25 NOTE — Progress Notes (Signed)
  Subjective:  Patient ID: Jonathan Kane, male    DOB: Jan 13, 1946,  MRN: [id number]  Chief Complaint  Patient presents with  . Callouses    Patient presents today for painful callous lesion left 2nd toe x years.  He says it only hurts when the callous gets really big and sometimes with shoes.  He has been trimming off himself for treatment    74 y.o. male presents with the above complaint. History confirmed with patient.  Painful and rubs in shoe gear.  He is diabetic, his A1c is 10%  Objective:  Physical Exam: warm, good capillary refill, no trophic changes or ulcerative lesions and normal DP and PT pulses. Interdigital tinea pedis noted bilaterally in all interspaces Left Foot: Moderate to severe hallux valgus with hammertoe contractures 2 through 5, the second digit abuts the hallux and is causing a large hyperkeratosis with a preulcerative callus.     Assessment:   1. Tinea pedis of left foot   2. Hammertoe of left foot   3. Hallux valgus with bunions, left   4. CKD (chronic kidney disease) stage 5, GFR less than 15 ml/min (HCC)   5. Uncontrolled type 2 diabetes mellitus with kidney complication, without long-term current use of insulin ([place])      Plan:  Patient was evaluated and treated and all questions answered.  Patient educated on diabetes. Discussed proper diabetic foot care and discussed risks and complications of disease. Educated patient in depth on reasons to return to the office immediately should he/she discover anything concerning or new on the feet. All questions answered. Discussed proper shoes as well.   All symptomatic hyperkeratoses were safely debrided with a sterile #15 blade to patient's level of comfort without incident. We discussed preventative and palliative care of these lesions including supportive and accommodative shoegear, padding, prefabricated and custom molded accommodative orthoses, use of a pumice stone and lotions/creams daily.  Also  discussed with him how he the hammertoe and bunion deformities contribute to the formation of the callus.  After debriding the callus we applied a silicone toe cap to the hallux which should prevent and pad out any rubbing.  Discussed the etiology and treatment options for interdigital tinea pedis.  Discussed topical and oral treatment.  Recommended topical treatment with 2% naftifine gel.  This was sent to the patient's pharmacy, let him know that if it is too expensive or he cannot get it then Castellani's paint or Betadine paint would be sufficient.  Also discussed appropriate foot hygiene, use of antifungal spray such as Tinactin in shoes, as well as cleaning her foot surfaces such as showers and bathroom floors with bleach.    Return in about 1 month (around 07/25/2020).

## 2020-06-25 NOTE — H&P (Signed)
Chief Complaint: Patient was seen in consultation today for fistulogram with possible intervention, possible tunneled dialysis catheter placement.   Referring Physician(s): Edrick Oh  Supervising Physician: Sandi Mariscal  Patient Status: [facility] - Out-pt  History of Present Illness: Jonathan Kane is a 74 y.o. 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 Reports good function of the fistula until his most recent dialysis session yesterday which required multiple manipulations and adjustments due to machine alarming for "pulling clots."  Patient referred to IR today for fistulagram with declot if needed.   Patient assessed in [facility] Radiology.  He is in his usual state of health and has no complaints or concerns. Reports he was able to complete a full session of dialysis yesterday, however with prolonged session due to several interruptions in rate/flow. Agreeable to procedure and HD catheter placement if ultimately necessary.  NPO.    Past Medical History:  Diagnosis Date  . Diabetes mellitus without complication ([place])   . Hypertension   . Renal disorder     Past Surgical History:  Procedure Laterality Date  . COLONOSCOPY      Allergies: Dulaglutide  Medications: Prior to Admission medications   Medication Sig Start Date End Date Taking? Authorizing Provider  ACCU-CHEK AVIVA PLUS test strip 1 each by Other route as needed.  04/06/17   [provider]  ACCU-CHEK SOFTCLIX LANCETS lancets CHECK BLOOD SUGAR TWO TO THREE TIMES DAILY 04/05/17   [provider]  Alcohol Swabs (B-D SINGLE USE SWABS REGULAR) PADS USE TO CHECK BLOOD SUGAR AS DIRECTED 01/26/17   [provider]  amLODipine (NORVASC) 10 MG tablet Take 10 mg by mouth daily. 05/20/20   [provider]  aspirin EC 81 MG tablet Take 81 mg by mouth daily.    [provider]  atorvastatin (LIPITOR) 40 MG tablet Take 40  mg by mouth at bedtime. 05/20/20   [provider]  Blood Glucose Monitoring Suppl (GLUCOCOM BLOOD GLUCOSE MONITOR) DEVI Use as directed.  Dx Code: E11.621 10/26/16   [provider]  cholecalciferol (VITAMIN D3) 25 MCG (1000 UNIT) tablet Take 1,000 Units by mouth daily. Patient not taking: Reported on 06/10/2020    [provider]  DILT-XR 180 MG 24 hr capsule Take 180 mg by mouth daily. 05/20/20   [provider]  hydrALAZINE (APRESOLINE) 25 MG tablet Take 25 mg by mouth 3 (three) times daily. Patient not taking: Reported on 06/10/2020    [provider]  insulin aspart (NOVOLOG) 100 UNIT/ML FlexPen Inject 0-7 Units into the skin 3 (three) times daily before meals.  07/03/19   [provider]  Insulin Pen Needle (FIFTY50 PEN NEEDLES) 31G X 8 MM MISC Use as directed per sliding scale.  Dx Code: E11.621 02/03/16   [provider]  latanoprost (XALATAN) 0.005 % ophthalmic solution Place 1 drop into both eyes at bedtime. 05/24/19   [provider]  lidocaine-prilocaine (EMLA) cream SMARTSIG:Sparingly Topical 3 Times a Week 06/08/20   [provider]  lisinopril (ZESTRIL) 5 MG tablet Take 5 mg by mouth daily. 05/20/20   [provider]  Naftifine HCl 2 % GEL Apply between toes twice daily 06/25/20   Criselda Peaches, DPM  PREVNAR 13 SUSP injection Inject 0.5 mLs into the muscle once.  Patient not taking: Reported on 06/10/2020 04/28/17   [provider]  sildenafil (REVATIO) 20 MG tablet Take 40-60 mg  by mouth daily as needed (ED).  10/10/19   [provider]  sodium bicarbonate 650 MG tablet Take 1 tablet (650 mg total) by mouth 2 (two) times daily. Patient taking differently: Take 650 mg by mouth daily.  11/08/19   Santos-Sanchez, Merlene Morse, MD  TRADJENTA 5 MG TABS tablet Take 5 mg by mouth daily. 06/16/20   [provider]  TRESIBA FLEXTOUCH 100 UNIT/ML SOPN FlexTouch Pen Inject 35 Units into the skin  daily. 11/03/19   [provider]     Family History  Problem Relation Age of Onset  . Ovarian cancer Mother   . Diabetes Sister     Social History   Socioeconomic History  . Marital status: Divorced    Spouse name: Not on file  . Number of children: Not on file  . Years of education: Not on file  . Highest education level: Not on file  Occupational History  . Not on file  Tobacco Use  . Smoking status: Former Research scientist (life sciences)  . Smokeless tobacco: Never Used  Vaping Use  . Vaping Use: Never used  Substance and Sexual Activity  . Alcohol use: Yes  . Drug use: No  . Sexual activity: Not on file  Other Topics Concern  . Not on file  Social History Narrative  . Not on file   Social Determinants of Health   Financial Resource Strain:   . Difficulty of Paying Living Expenses: Not on file  Food Insecurity:   . Worried About Charity fundraiser in the Last Year: Not on file  . Ran Out of Food in the Last Year: Not on file  Transportation Needs:   . Lack of Transportation (Medical): Not on file  . Lack of Transportation (Non-Medical): Not on file  Physical Activity:   . Days of Exercise per Week: Not on file  . Minutes of Exercise per Session: Not on file  Stress:   . Feeling of Stress : Not on file  Social Connections:   . Frequency of Communication with Friends and Family: Not on file  . Frequency of Social Gatherings with Friends and Family: Not on file  . Attends Religious Services: Not on file  . Active Member of Clubs or Organizations: Not on file  . Attends Archivist Meetings: Not on file  . Marital Status: Not on file    Review of Systems: A 12 point ROS discussed and pertinent positives are indicated in the HPI above.  All other systems are negative.  Review of Systems  Constitutional: Negative for fatigue and fever.  Respiratory: Negative for cough and shortness of breath.   Cardiovascular: Negative for chest pain.  Gastrointestinal: Negative  for abdominal pain, nausea and vomiting.  Musculoskeletal: Negative for back pain.  Psychiatric/Behavioral: Negative for behavioral problems and confusion.    Vital Signs: BP (!) 150/75   Pulse 85   Temp 97.7 F (36.5 C) (Oral)   Resp 16   Ht 5\' 10"  (1.778 m)   Wt 160 lb (72.6 kg)   SpO2 99%   BMI 22.96 kg/m   Physical Exam Vitals and nursing note reviewed.  Constitutional:      General: He is not in acute distress.    Appearance: Normal appearance. He is not ill-appearing.  HENT:     Mouth/Throat:     Mouth: Mucous membranes are moist.     Pharynx: Oropharynx is clear.  Cardiovascular:     Rate and Rhythm: Normal rate and regular rhythm.  Pulmonary:     Effort: Pulmonary effort is normal. No respiratory distress.     Breath sounds: Normal breath sounds.  Abdominal:     General: Abdomen is flat.     Palpations: Abdomen is soft.  Musculoskeletal:     Comments: L AVF in place.  Palpable bruit and thrill.  Small area of swelling at proximal end  Skin:    General: Skin is warm and dry.  Neurological:     General: No focal deficit present.     Mental Status: He is alert and oriented to person, place, and time. Mental status is at baseline.  Psychiatric:        Mood and Affect: Mood normal.        Behavior: Behavior normal.        Thought Content: Thought content normal.        Judgment: Judgment normal.     Imaging: ECHOCARDIOGRAM COMPLETE  Result Date: 05/28/2020    ECHOCARDIOGRAM REPORT   Patient Name:   Jonathan Kane Date of Exam: 05/28/2020 Medical Rec #:  [id number]        Height:       70.0 in Accession #:    [id number]       Weight:       178.0 lb Date of Birth:  06/19/1946         BSA:          1.986 m Patient Age:    62 years         BP:           130/75 mmHg Patient Gender: M                HR:           81 bpm. 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                 Kidney disease on hemodialysis; Risk Factors:Diabetes,                 Hypertension and Former Smoker.  Sonographer:    Lenard Galloway [facility], RDCS Referring Phys: [phone] [place]  1. Left ventricular ejection fraction, by estimation, is 55 to 60%. The left ventricle has normal function. The left ventricle has no regional wall motion abnormalities. There is moderate concentric left ventricular hypertrophy. Left ventricular diastolic parameters are consistent with Grade II diastolic dysfunction (pseudonormalization).  2. Right ventricular systolic function is normal. The right ventricular size is normal. There is mildly elevated pulmonary artery systolic pressure. The estimated right ventricular systolic pressure is 58.5 mmHg.  3. Left atrial size was moderately dilated.  4. Right atrial size was mildly dilated.  5. The mitral valve is normal in structure. Trivial mitral valve regurgitation. No evidence of mitral stenosis.  6. Tricuspid valve regurgitation is mild to moderate.  7. The aortic valve is tricuspid. Aortic valve regurgitation is not visualized. No aortic stenosis is present.  8. The inferior vena cava is normal in size with greater than 50% respiratory variability, suggesting right atrial pressure of 3 mmHg. Comparison(s): No prior Echocardiogram. Conclusion(s)/Recommendation(s): While EF is normal, strain is abnormal. Suggests risk for future LV dysfunction. FINDINGS  Left Ventricle: Abnormal strain measurements. Left ventricular ejection fraction, by estimation, is  55 to 60%. The left ventricle has normal function. The left ventricle has no regional wall motion abnormalities. The left ventricular internal cavity size was normal in size. There is moderate concentric left ventricular hypertrophy. Left ventricular diastolic parameters are consistent with Grade II diastolic dysfunction  (pseudonormalization). Right Ventricle: The right ventricular size is normal. No increase in right ventricular wall thickness. Right ventricular systolic function is normal. There is mildly elevated pulmonary artery systolic pressure. The tricuspid regurgitant velocity is 2.96  m/s, and with an assumed right atrial pressure of 3 mmHg, the estimated right ventricular systolic pressure is 83.1 mmHg. Left Atrium: Left atrial size was moderately dilated. Right Atrium: Right atrial size was mildly dilated. Pericardium: There is no evidence of pericardial effusion. Mitral Valve: The mitral valve is normal in structure. Trivial mitral valve regurgitation. No evidence of mitral valve stenosis. Tricuspid Valve: The tricuspid valve is normal in structure. Tricuspid valve regurgitation is mild to moderate. Aortic Valve: The aortic valve is tricuspid. Aortic valve regurgitation is not visualized. No aortic stenosis is present. Pulmonic Valve: The pulmonic valve was grossly normal. Pulmonic valve regurgitation is mild. No evidence of pulmonic stenosis. Aorta: The aortic root, ascending aorta, aortic arch and descending aorta are all structurally normal, with no evidence of dilitation or obstruction. Venous: The inferior vena cava is normal in size with greater than 50% respiratory variability, suggesting right atrial pressure of 3 mmHg. IAS/Shunts: No atrial level shunt detected by color flow Doppler.  LEFT VENTRICLE PLAX 2D LVIDd:         4.70 cm  Diastology LVIDs:         3.10 cm  LV e' lateral:   7.72 cm/s LV PW:         1.50 cm  LV E/e' lateral: 11.8 LV IVS:        1.40 cm  LV e' medial:    5.87 cm/s LVOT diam:     2.20 cm  LV E/e' medial:  15.6 LV SV:         73 LV SV Index:   37       2D Longitudinal Strain LVOT Area:     3.80 cm 2D Strain GLS (A2C):   -11.7 %                         2D Strain GLS (A3C):   -11.0 %                         2D Strain GLS (A4C):   -12.1 %                         2D Strain GLS Avg:     -11.6 %                           3D Volume EF:                         3D EF:        56 %                         LV EDV:       182 ml  LV ESV:       81 ml                         LV SV:        102 ml RIGHT VENTRICLE RV Basal diam:  4.10 cm RV Mid diam:    2.90 cm RV S prime:     12.10 cm/s TAPSE (M-mode): 2.4 cm RVSP:           38.0 mmHg LEFT ATRIUM             Index       RIGHT ATRIUM LA diam:        4.10 cm 2.06 cm/m  RA Pressure: 3.00 mmHg LA Vol (A2C):   86.7 ml 43.65 ml/m LA Vol (A4C):   56.6 ml 28.49 ml/m LA Biplane Vol: 70.2 ml 35.34 ml/m  AORTIC VALVE LVOT Vmax:   111.00 cm/s LVOT Vmean:  74.800 cm/s LVOT VTI:    0.193 m  AORTA Ao Root diam: 3.30 cm Ao Asc diam:  3.40 cm MITRAL VALVE               TRICUSPID VALVE MV Area (PHT):             TR Peak grad:   35.0 mmHg MV Decel Time: 187 msec    TR Vmax:        296.00 cm/s MV E velocity: 91.30 cm/s  Estimated RAP:  3.00 mmHg MV A velocity: 86.50 cm/s  RVSP:           38.0 mmHg MV E/A ratio:  1.06                            SHUNTS                            Systemic VTI:  0.19 m                            Systemic Diam: 2.20 cm Buford Dresser MD Electronically signed by Buford Dresser MD Signature Date/Time: 05/28/2020/2:59:07 PM    Final     Labs:  CBC: Recent Labs    11/07/19 0717 11/08/19 0411  WBC 7.8 5.8  HGB 9.6* 9.5*  HCT 28.5* 27.4*  PLT 182 169    COAGS: No results for input(s): INR, APTT in the last 8760 hours.  BMP: Recent Labs    11/07/19 0717 11/08/19 0411  NA 142 140  K 3.8 3.6  CL 108 110  CO2 20* 20*  GLUCOSE 152* 145*  BUN 64* 63*  CALCIUM 8.6* 8.4*  CREATININE 6.75* 6.57*  GFRNONAA 7* 8*  GFRAA 9* 9*    LIVER FUNCTION TESTS: Recent Labs    11/07/19 0717 11/08/19 0411  BILITOT 0.3  --   AST 14*  --   ALT 16  --   ALKPHOS 65  --   PROT 6.9  --   ALBUMIN 3.3* 2.9*    TUMOR MARKERS: No results for input(s): AFPTM, CEA, CA199, CHROMGRNA in the last 8760  hours.  Assessment and Plan: End stage renal disease; malfunctioning left arm AVF Jonathan Kane, 73 year old male, presents today to the [facility] Interventional Radiology department for an image-guided fistulogram with possible intervention, possible tunneled dialysis catheter placement.  Risks and benefits discussed with the  patient including, but not limited to bleeding, infection, vascular injury, pulmonary embolism, need for tunneled HD catheter placement or even death.  All of the patient's questions were answered, patient is agreeable to proceed.  The patient has been NPO. Vitals have been reviewed.   Consent signed and in chart.   Thank you for this interesting consult.  I greatly enjoyed meeting Jonathan Kane and look forward to participating in their care.  A copy of this report was sent to the requesting provider on this date.  Electronically Signed: Soyla Dryer, AGACNP-BC [phone] 06/25/2020, 1:04 PM   I spent a total of  30 Minutes   in face to face in clinical consultation, greater than 50% of which was counseling/coordinating care for malfunctioning AV fistula.

## 2020-06-25 NOTE — Patient Instructions (Addendum)
Castellani's paint is the solution you can use to put between the toes, or betadine (iodine solution). This can be bought over the pharmacy  Dry the toes daily and apply the gel or betadine/Castellanis Athlete's Foot  Athlete's foot (tinea pedis) is a fungal infection of the skin on your feet. It often occurs on the skin that is between or underneath your toes. It can also occur on the soles of your feet. Symptoms include itchy or white and flaky areas on the skin. The infection can spread from person to person (is contagious). It can also spread when a person's bare feet come in contact with the fungus on shower floors or on items such as shoes. Follow these instructions at home: Medicines  Apply or take over-the-counter and prescription medicines only as told by your doctor.  Apply your antifungal medicine as told by your doctor. Do not stop using the medicine even if your feet start to get better. Foot care  Do not scratch your feet.  Keep your feet dry: ? Wear cotton or wool socks. Change your socks every day or if they become wet. ? Wear shoes that allow air to move around, such as sandals or canvas tennis shoes.  Wash and dry your feet: ? Every day or as told by your doctor. ? After exercising. ? Including the area between your toes. General instructions  Do not share any of these items that touch your feet: ? Towels. ? Shoes. ? Nail clippers. ? Other personal items.  Protect your feet by wearing sandals in wet areas, such as locker rooms and shared showers.  Keep all follow-up visits as told by your doctor. This is important.  If you have diabetes, keep your blood sugar under control. Contact a doctor if:  You have a fever.  You have swelling, pain, warmth, or redness in your foot.  Your feet are not getting better with treatment.  Your symptoms get worse.  You have new symptoms. Summary  Athlete's foot is a fungal infection of the skin on your  feet.  Symptoms include itchy or white and flaky areas on the skin.  Apply your antifungal medicine as told by your doctor.  Keep your feet clean and dry. This information is not intended to replace advice given to you by your health care provider. Make sure you discuss any questions you have with your health care provider. Document Revised: 09/28/2017 Document Reviewed: 07/24/2017 Elsevier Patient Education  [place].

## 2020-06-25 NOTE — Sedation Documentation (Signed)
Antibiotics not given prior to procedure per verbal orders from Dr Pascal Lux. Verbal orders read back and verified.  No sedation medications given at this time.

## 2020-06-25 NOTE — Procedures (Signed)
Pre-procedure Diagnosis: ESRD Post-procedure Diagnosis: Same  The left distal radial-cephalic AV fistula is widely patent, however there are hypertrophied venous collaterals at the level of the anastomosis as well as the proximal forearm which may be resulting in suboptimal maturation of the AV fistula.  Otherwise, no explanation for difficulty cannulating the left forearm AV fistula.  No intervention performed.  Complications: None Immediate EBL: Trace  ACCESS: This access remains amenable to future percutaneous interventions as clinically indicated however if cannulation continues to prove difficult, surgical revision may ultimately be required.   Ronny Bacon, MD Pager #: [phone]

## 2020-06-26 LAB — GLUCOSE, CAPILLARY: Glucose-Capillary: 189 mg/dL — ABNORMAL HIGH (ref 70–99)

## 2020-07-23 ENCOUNTER — Ambulatory Visit (INDEPENDENT_AMBULATORY_CARE_PROVIDER_SITE_OTHER): Payer: Medicare Other | Admitting: Podiatry

## 2020-07-23 ENCOUNTER — Other Ambulatory Visit: Payer: Self-pay

## 2020-07-23 DIAGNOSIS — Z5321 Procedure and treatment not carried out due to patient leaving prior to being seen by health care provider: Secondary | ICD-10-CM

## 2020-07-24 NOTE — Progress Notes (Signed)
Patient LW BS by provider

## 2020-07-28 ENCOUNTER — Other Ambulatory Visit: Payer: Self-pay

## 2020-07-28 ENCOUNTER — Ambulatory Visit (INDEPENDENT_AMBULATORY_CARE_PROVIDER_SITE_OTHER): Payer: Medicare Other

## 2020-07-28 ENCOUNTER — Ambulatory Visit (INDEPENDENT_AMBULATORY_CARE_PROVIDER_SITE_OTHER): Payer: Medicare Other | Admitting: Podiatry

## 2020-07-28 DIAGNOSIS — M2042 Other hammer toe(s) (acquired), left foot: Secondary | ICD-10-CM | POA: Diagnosis not present

## 2020-07-28 NOTE — Patient Instructions (Signed)
Bunion Surgery  Bunion surgery is done to remove a bunion, which is a bony bump on the big toe joint, on the inner side of the foot. A bunion can develop over time when pressure turns the big toe and joint toward the other toes. Bunions may be caused by wearing shoes that are too tight or narrow. They can also be caused by conditions such as rheumatoid arthritis and flat feet. You may need bunion surgery if your bunion is very large or painful or it affects your ability to walk. Tell a health care provider about:  Any allergies you have.  All medicines you are taking, including vitamins, herbs, eye drops, creams, and over-the-counter medicines.  Any problems you or family members have had with anesthetic medicines.  Any blood disorders you have.  Any surgeries you have had.  Any medical conditions you have.  Whether you are pregnant or may be pregnant. What are the risks? Generally, this is a safe procedure. However, problems may occur, including:  Infection.  Bleeding or blood clots.  Allergic reactions to medicines.  Nerve damage.  Pain.  Numbness, stiffness, or arthritis in the toe.  Return of the bunion.  Failure of the bone to heal completely after surgery.  Failure of the surgery to relieve symptoms. What happens before the procedure? General instructions  Ask your health care provider about: ? Changing or stopping your regular medicines. This is especially important if you are taking diabetes medicines or blood thinners. ? Taking medicines such as aspirin and ibuprofen. These medicines can thin your blood. Do not take these medicines unless your health care provider tells you to take them. ? Taking over-the-counter medicines, vitamins, herbs, and supplements.  Do not drink alcohol before the procedure as told by your health care provider.  Do not use any products that contain nicotine or tobacco, such as cigarettes and e-cigarettes. These can delay bone  healing. If you need help quitting, ask your health care provider.  Plan to have someone take you home from the hospital or clinic.  Plan to have a responsible adult care for you for at least 24 hours after you leave the hospital or clinic. This is important.  Ask your health care provider how your surgical site will be marked or identified. Staying hydrated Follow instructions from your health care provider about hydration, which may include:  Up to 2 hours before the procedure - you may continue to drink clear liquids, such as water, clear fruit juice, black coffee, and plain tea. Eating and drinking restrictions Follow instructions from your health care provider about eating and drinking, which may include:  8 hours before the procedure - stop eating heavy meals or foods such as meat, fried foods, or fatty foods.  6 hours before the procedure - stop eating light meals or foods, such as toast or cereal.  6 hours before the procedure - stop drinking milk or drinks that contain milk.  2 hours before the procedure - stop drinking clear liquids. What happens during the procedure?  To lower your risk of infection: ? Your health care team will wash or sanitize their hands. ? Your skin will be washed with soap.  An IV will be inserted into one of your veins.  You will be given one of the following: ? A medicine to numb the area (local anesthetic). ? A medicine to make you fall asleep (general anesthetic).  An incision will be made over the bump at the big toe joint.  Depending on the type of procedure that is needed for your bunion, your surgeon may do one or more of the following: ? Remove the bunion (exostectomy). ? Cut or replace the damaged joint in your big toe (osteotomy). ? Use screws or other hardware to keep your foot in the correct position (arthrodesis). ? Tighten or loosen tissues around the big toe to reposition the toe.  The incision will be closed with stitches  (sutures) and covered with adhesive strips or another type of bandage (dressing).  A supportive device, such as a cast that you can walk on (boot), may be placed on your foot. The procedure may vary among health care providers and hospitals. What happens after the procedure?  Your blood pressure, heart rate, breathing rate, and blood oxygen level will be monitored until the medicines you were given have worn off.  Do not drive for 24 hours if you were given a sedative during your procedure.  Ask your health care provider when it is safe to drive if you have a boot or other supportive device on your foot.  You may be given instructions about how much body weight you can or cannot support on your foot (weight-bearing restrictions). You may be given crutches, a cane, or a walker to help you move around so that you do not put (bear) weight on your foot. Summary  Bunion surgery is done to remove a bunion, which is a bony bump on the big toe joint, on the inner side of the foot.  You may need bunion surgery if your bunion is very large or painful or it affects your ability to walk.  Before the procedure, follow instructions from your health care provider about eating and drinking, and ask about changing or stopping your regular medicines. This information is not intended to replace advice given to you by your health care provider. Make sure you discuss any questions you have with your health care provider. Document Revised: 09/15/2017 Document Reviewed: 07/10/2017 Elsevier Patient Education  2020 [employer].

## 2020-07-29 NOTE — Progress Notes (Signed)
  Subjective:  Patient ID: Jonathan Kane, male    DOB: [birth year],  MRN: [id number]  Chief Complaint  Patient presents with  . Foot Ulcer     65mth f/u LT FT Ulcer    74 y.o. male returns with the above complaint. History confirmed with patient.  Notes improvement in the lesion since last visit.  He has been wearing the silicone pads to offload the callus.  He says he thinks his new A1c was about 7%  Objective:  Physical Exam: warm, good capillary refill, no trophic changes or ulcerative lesions and normal DP and PT pulses.  Visual pain has resolved Left Foot: Moderate to severe hallux valgus with hammertoe contractures 2 through 5, the second digit abuts the hallux and is causing a large hyperkeratosis with a preulcerative callus, improved since last visit.    Weightbearing radiographs 3 views left foot: Moderate hallux abductovalgus lateral deviation of the sesamoids, hammertoe contractures 2-5 Assessment:   1. Hammertoe of left foot      Plan:  Patient was evaluated and treated and all questions answered.   -I again discussed that we can continue with nonsurgical treatment with silicone pads offloading the lesions.  I debrided the lesion today to a comfortable level for him this was helpful.  Has improved significantly since last visit.  I do think it will continue to come back given the level of his deformity.  -We also discussed surgical treatment for this including shortening the great toe with hallux valgus correction as well as hammertoe correction of the second third digits.  I discussed with him that he is at risk for wound infection and wound healing complication as well as bone healing complications given his diabetes following surgery and these consider this carefully.  He says that his last A1c was in the 7% range, which is down from 10% this winter.  Potentially he could be a good candidate for a small incision percutaneous correction of his deformity with a chevron and  Akin osteotomies.  -He will consider the above and return in 6 weeks for follow-up and we will discuss further at that time.    Return in about 6 weeks (around 09/08/2020).

## 2020-09-08 ENCOUNTER — Other Ambulatory Visit: Payer: Self-pay

## 2020-09-08 ENCOUNTER — Ambulatory Visit (INDEPENDENT_AMBULATORY_CARE_PROVIDER_SITE_OTHER): Payer: Medicare Other | Admitting: Podiatry

## 2020-09-08 ENCOUNTER — Encounter: Payer: Self-pay | Admitting: Podiatry

## 2020-09-08 DIAGNOSIS — M21612 Bunion of left foot: Secondary | ICD-10-CM | POA: Diagnosis not present

## 2020-09-08 DIAGNOSIS — L84 Corns and callosities: Secondary | ICD-10-CM

## 2020-09-08 DIAGNOSIS — Z992 Dependence on renal dialysis: Secondary | ICD-10-CM

## 2020-09-08 DIAGNOSIS — M2042 Other hammer toe(s) (acquired), left foot: Secondary | ICD-10-CM

## 2020-09-08 DIAGNOSIS — B353 Tinea pedis: Secondary | ICD-10-CM

## 2020-09-08 DIAGNOSIS — N186 End stage renal disease: Secondary | ICD-10-CM | POA: Diagnosis not present

## 2020-09-08 DIAGNOSIS — M2012 Hallux valgus (acquired), left foot: Secondary | ICD-10-CM | POA: Diagnosis not present

## 2020-09-08 DIAGNOSIS — E1122 Type 2 diabetes mellitus with diabetic chronic kidney disease: Secondary | ICD-10-CM

## 2020-09-08 DIAGNOSIS — Z794 Long term (current) use of insulin: Secondary | ICD-10-CM | POA: Diagnosis not present

## 2020-09-08 MED ORDER — NAFTIFINE HCL 2 % EX GEL
CUTANEOUS | 2 refills | Status: DC
Start: 2020-09-08 — End: 2021-06-09

## 2020-09-08 NOTE — Progress Notes (Signed)
  Subjective:  Patient ID: Jonathan Kane, male    DOB: [birth year],  MRN: [id number]  Chief Complaint  Patient presents with  . Foot Ulcer    left foot 2nd toe Pt stated that he is doing okay but sometimes he will get a burning sensation on that toe     74 y.o. male returns with the above complaint. History confirmed with patient.  Lesion is still there.  He has not had his A1c checked recently  Objective:  Physical Exam: warm, good capillary refill, no trophic changes or ulcerative lesions and normal DP and PT pulses.  Visual pain has resolved Left Foot: Moderate to severe hallux valgus with hammertoe contractures 2 through 5, the second digit abuts the hallux and is causing a large hyperkeratosis with a preulcerative callus, stable since last visit.    Weightbearing radiographs 3 views left foot: Moderate hallux abductovalgus lateral deviation of the sesamoids, hammertoe contractures 2-5 Assessment:   1. Controlled type 2 diabetes mellitus with chronic kidney disease on chronic dialysis, with long-term current use of insulin ([place])   2. Hammertoe of left foot   3. Hallux valgus with bunions, left   4. Tinea pedis of left foot   5. Callus of foot      Plan:  Patient was evaluated and treated and all questions answered.   -I again discussed that we can continue with nonsurgical treatment with silicone pads offloading the lesions.  I debrided the lesion today to a comfortable level for him this was helpful.  Has improved significantly since last visit.  I do think it will continue to come back given the level of his deformity.  -We again also discussed surgical treatment for this including shortening the great toe with hallux valgus correction as well as hammertoe correction of the second third digits.  I discussed with him that he is at risk for wound infection and wound healing complication as well as bone healing complications given his diabetes following surgery and these consider  this carefully.  He has not had his A1c checked recently.  I have ordered this to be checked for him the first part of January.  I think be good to see what it is after the holidays and if it is 7% or below we will proceed with surgery correction of the bunion and hammertoe deformities.  I emphasized the importance of strict lasting control and that surgery not be an option above 8% and in between 7 and 8 % he is at increased risk for wound healing and bone healing complications.  Surgical we discussed percutaneous small incision bunion correction with chevron osteotomy and Akin osteotomy and hammertoe correction of the second toe   Return in about 7 weeks (around 10/27/2020).

## 2020-09-08 NOTE — Patient Instructions (Signed)
Get your Hemoglobin A1c checked the week before your next appointment

## 2020-10-29 ENCOUNTER — Ambulatory Visit: Payer: Medicare Other | Admitting: Podiatry

## 2020-11-03 ENCOUNTER — Ambulatory Visit: Payer: Medicare Other | Admitting: Podiatry

## 2020-12-17 ENCOUNTER — Emergency Department (HOSPITAL_COMMUNITY)
Admission: EM | Admit: 2020-12-17 | Discharge: 2020-12-17 | Disposition: A | Discharge status: Home / Self Care | Payer: Medicare Other | Attending: Emergency Medicine | Admitting: Emergency Medicine

## 2020-12-17 ENCOUNTER — Emergency Department (HOSPITAL_COMMUNITY): Payer: Medicare Other

## 2020-12-17 ENCOUNTER — Encounter (HOSPITAL_COMMUNITY): Payer: Self-pay | Admitting: Emergency Medicine

## 2020-12-17 DIAGNOSIS — E876 Hypokalemia: Secondary | ICD-10-CM | POA: Diagnosis not present

## 2020-12-17 DIAGNOSIS — Z7982 Long term (current) use of aspirin: Secondary | ICD-10-CM | POA: Diagnosis not present

## 2020-12-17 DIAGNOSIS — E875 Hyperkalemia: Secondary | ICD-10-CM

## 2020-12-17 DIAGNOSIS — N185 Chronic kidney disease, stage 5: Secondary | ICD-10-CM | POA: Insufficient documentation

## 2020-12-17 DIAGNOSIS — I120 Hypertensive chronic kidney disease with stage 5 chronic kidney disease or end stage renal disease: Secondary | ICD-10-CM | POA: Insufficient documentation

## 2020-12-17 DIAGNOSIS — Z87891 Personal history of nicotine dependence: Secondary | ICD-10-CM | POA: Insufficient documentation

## 2020-12-17 DIAGNOSIS — I959 Hypotension, unspecified: Secondary | ICD-10-CM

## 2020-12-17 DIAGNOSIS — E1122 Type 2 diabetes mellitus with diabetic chronic kidney disease: Secondary | ICD-10-CM | POA: Insufficient documentation

## 2020-12-17 DIAGNOSIS — Z79899 Other long term (current) drug therapy: Secondary | ICD-10-CM | POA: Diagnosis not present

## 2020-12-17 DIAGNOSIS — Z992 Dependence on renal dialysis: Secondary | ICD-10-CM | POA: Diagnosis not present

## 2020-12-17 DIAGNOSIS — Z7984 Long term (current) use of oral hypoglycemic drugs: Secondary | ICD-10-CM | POA: Insufficient documentation

## 2020-12-17 DIAGNOSIS — Z794 Long term (current) use of insulin: Secondary | ICD-10-CM | POA: Insufficient documentation

## 2020-12-17 LAB — BASIC METABOLIC PANEL
Anion gap: 16 — ABNORMAL HIGH (ref 5–15)
BUN: 52 mg/dL — ABNORMAL HIGH (ref 8–23)
CO2: 23 mmol/L (ref 22–32)
Calcium: 9.3 mg/dL (ref 8.9–10.3)
Chloride: 98 mmol/L (ref 98–111)
Creatinine, Ser: 9.92 mg/dL — ABNORMAL HIGH (ref 0.61–1.24)
GFR, Estimated: 5 mL/min — ABNORMAL LOW (ref >60)
Glucose, Bld: 50 mg/dL — ABNORMAL LOW (ref 70–99)
Potassium: 5.2 mmol/L — ABNORMAL HIGH (ref 3.5–5.1)
Sodium: 137 mmol/L (ref 135–145)

## 2020-12-17 LAB — CBC
HCT: 37 % — ABNORMAL LOW (ref 39.0–52.0)
Hemoglobin: 12.5 g/dL — ABNORMAL LOW (ref 13.0–17.0)
MCH: 32.8 pg (ref 26.0–34.0)
MCHC: 33.8 g/dL (ref 30.0–36.0)
MCV: 97.1 fL (ref 80.0–100.0)
Platelets: 200 10*3/uL (ref 150–400)
RBC: 3.81 MIL/uL — ABNORMAL LOW (ref 4.22–5.81)
RDW: 17 % — ABNORMAL HIGH (ref 11.5–15.5)
WBC: 7.3 10*3/uL (ref 4.0–10.5)
nRBC: 0 % (ref 0.0–0.2)

## 2020-12-17 LAB — LACTIC ACID, PLASMA: Lactic Acid, Venous: 1.8 mmol/L (ref 0.5–1.9)

## 2020-12-17 LAB — TROPONIN I (HIGH SENSITIVITY)
Troponin I (High Sensitivity): 10 ng/L (ref <18)
Troponin I (High Sensitivity): 10 ng/L (ref <18)

## 2020-12-17 IMAGING — DX DG CHEST 2V
2 series · 2 of 2 positions shown · non-contrast
Comparison: [DATE]

CLINICAL DATA: Recent syncopal episode

EXAM:
CHEST - 2 VIEW

[chest lat]
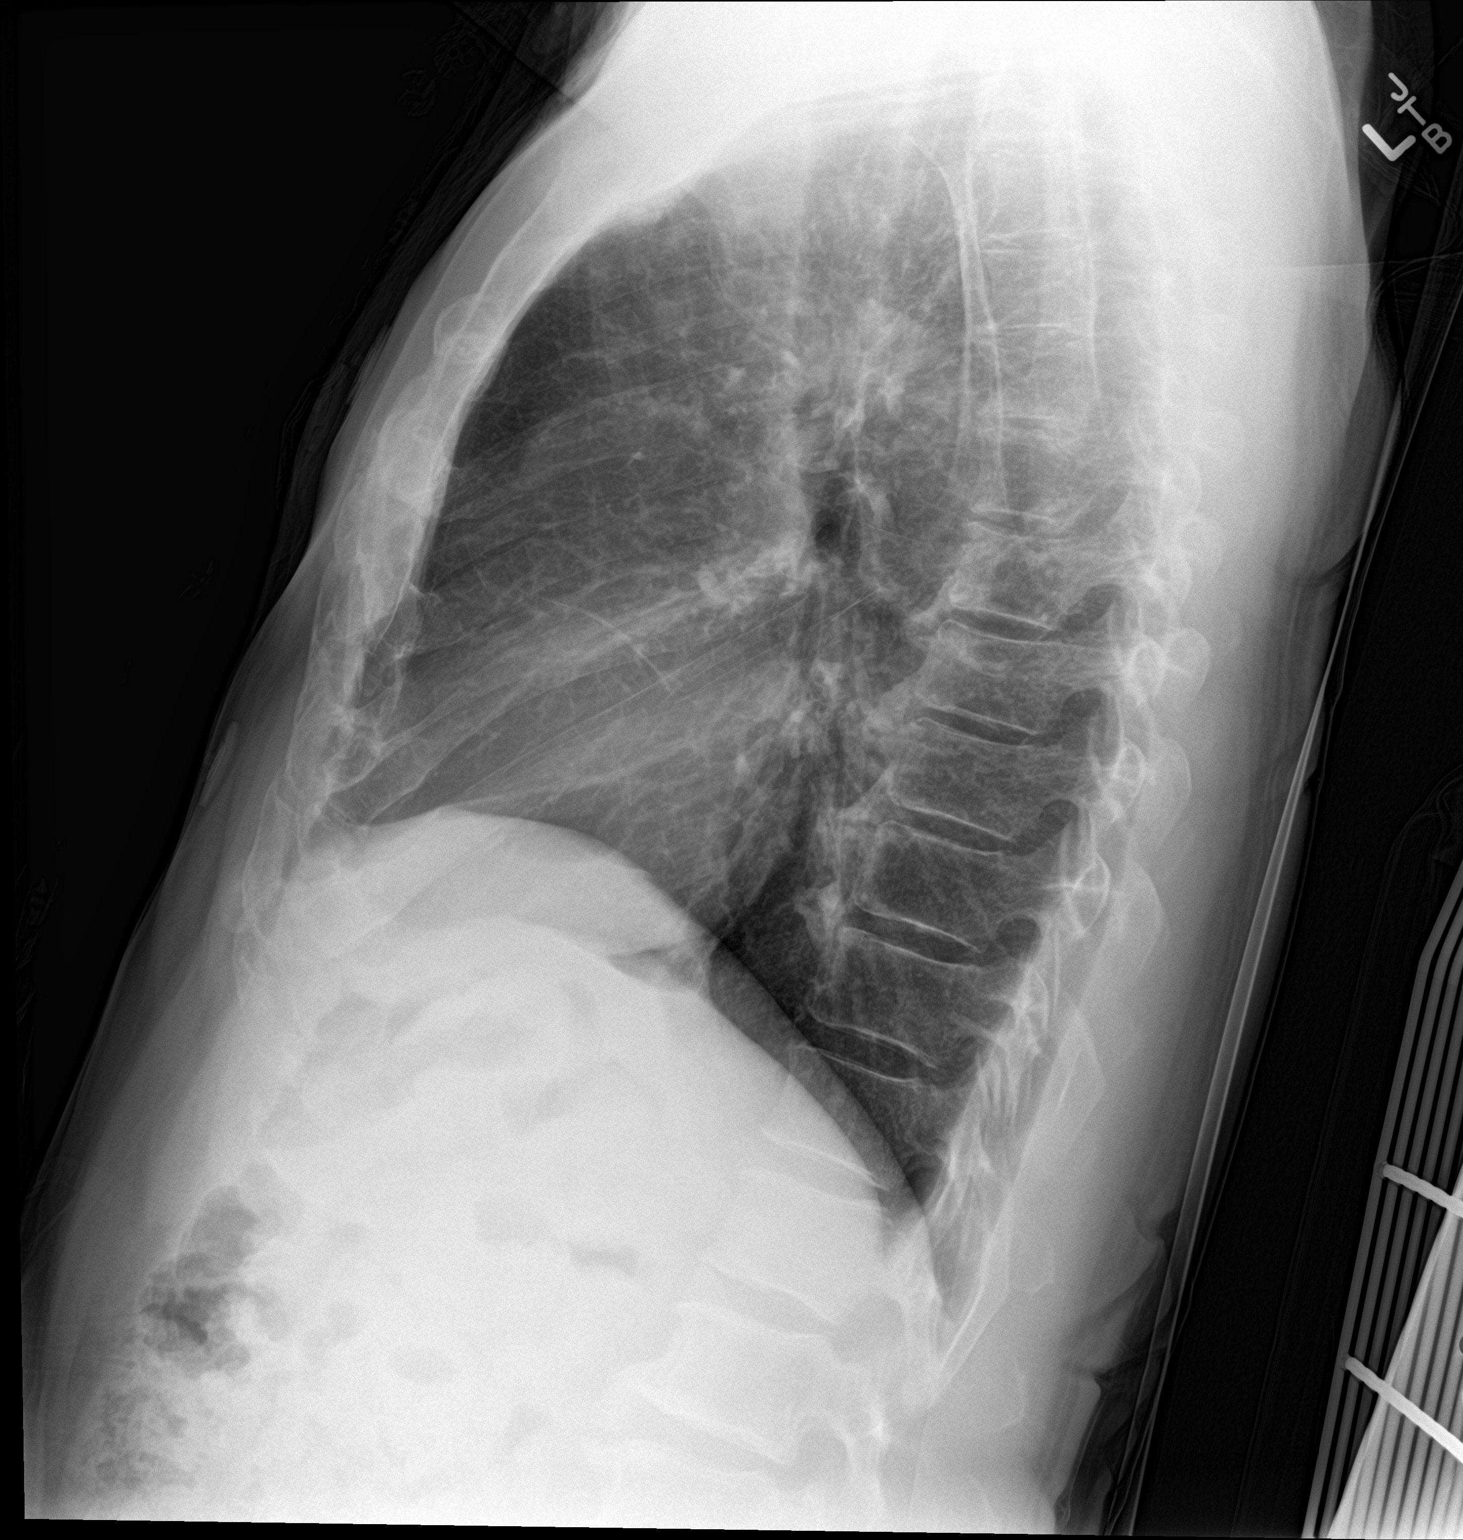

[chest ap]
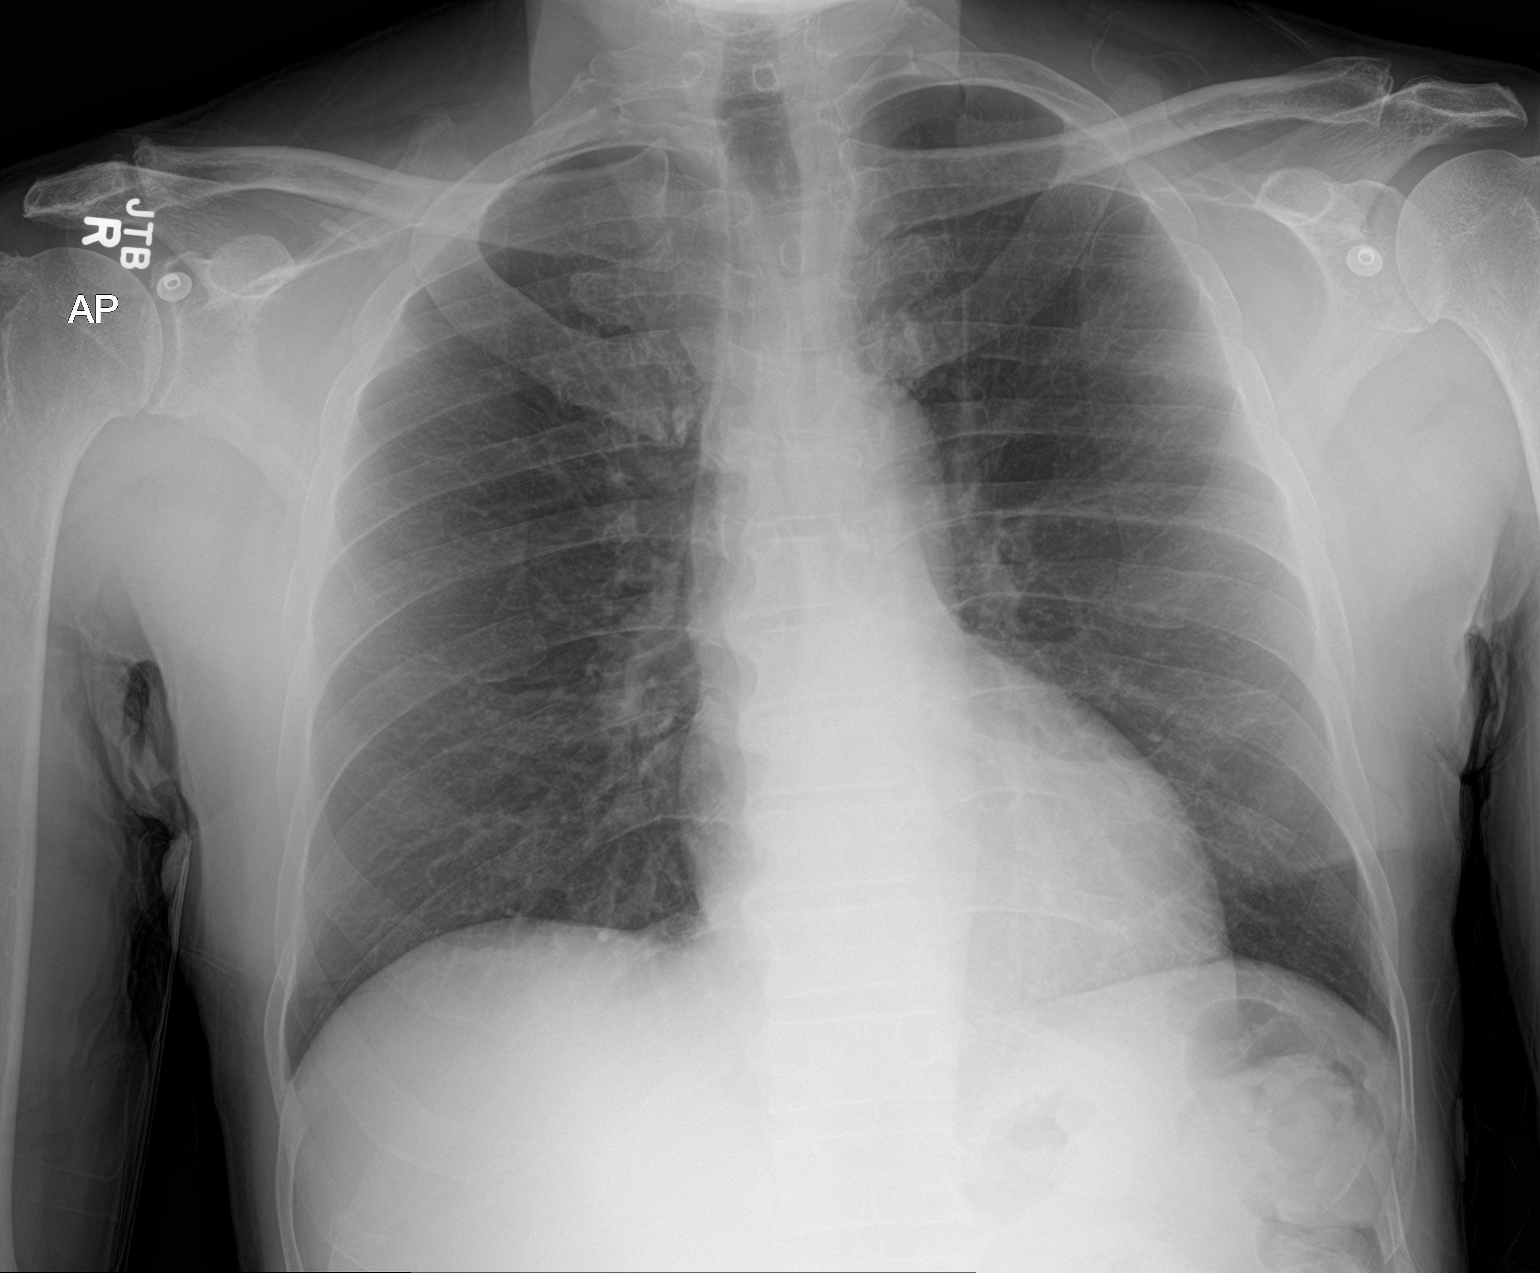

[2 of 2 positions shown; findings below may reference images not displayed]

FINDINGS: Cardiac shadow is within normal limits. The lungs are well aerated
bilaterally. No focal infiltrate or effusion is seen. Degenerative
changes of the thoracic spine are noted.
IMPRESSION: No acute abnormality noted.

## 2020-12-17 MED ORDER — SODIUM ZIRCONIUM CYCLOSILICATE 5 G PO PACK
5.0000 g | PACK | Freq: Once | ORAL | Status: AC
  Administered 2020-12-17: 5 g via ORAL
  Filled 2020-12-17: qty 1

## 2020-12-17 MED ORDER — SODIUM CHLORIDE 0.9 % IV BOLUS
250.0000 mL | Freq: Once | INTRAVENOUS | Status: AC
  Administered 2020-12-17: 250 mL via INTRAVENOUS

## 2020-12-17 NOTE — ED Provider Notes (Signed)
[facility] EMERGENCY DEPARTMENT Provider Note   CSN: [id number] Arrival date & time: 12/17/20  1538     History Chief Complaint  Patient presents with  . Hypotension    Jonathan Kane is a 75 y.o. male.  Patient is a 75 year old male who presents with low blood pressure.  He has a history of diabetes, hypertension and end-stage renal disease on dialysis Monday Wednesday Friday.  He reports that he completed his dialysis session yesterday.  Today he has felt a little bit lightheaded on standing.  He says when he is laying down he does not feel dizzy or lightheaded.  He went to his PCPs office today for routine checkup and his blood pressure was noted to be in the [id number] systolic.  He was sent here for further evaluation.  He says other than the lightheadedness, he does not have any other complaints.  He says that he has had some intermittent pain in his left shoulder.  This has been going on for a while that usually happens during dialysis.  Today he had one episode of a similar discomfort in his left shoulder.  Its not worse with movement.  It does not radiate to his chest.  He has no associated shortness of breath.  It lasted for he says 1 to 2 minutes and he has not had any further episodes.  He denies any leg swelling.  He does urinate about twice a day but he denies any difficulty or pain on urination.  No abdominal pain.  He denies any difficulty with his balance or ataxia.  No vision changes or speech deficits.  No numbness or weakness to his extremities.        Past Medical History:  Diagnosis Date  . Diabetes mellitus without complication ([place])   . Hypertension   . Renal disorder     Patient Active Problem List   Diagnosis Date Noted  . Normal anion gap metabolic acidosis [id number]  . Secondary hyperparathyroidism of renal origin ([place]) 12/26/2019  . Acute metabolic encephalopathy [id number]  . Acute on chronic renal failure ([place]) 11/07/2019  . CKD  (chronic kidney disease) stage 5, GFR less than 15 ml/min (HCC) 09/02/2019  . CKD (chronic kidney disease) stage 4, GFR 15-29 ml/min (HCC) 08/01/2019  . Paronychia of fifth toe of left foot 05/29/2019  . Microalbuminuria due to type 2 diabetes mellitus ([place]) 02/14/2019  . Posterior vitreous detachment of both eyes 01/17/2019  . Presbyopia of both eyes 01/17/2019  . Pseudophakia of both eyes 01/17/2019  . Abnormal finding on diagnostic imaging of right testicle 09/14/2018  . Epididymitis, left 09/14/2018  . Swelling of left testicle 08/23/2018  . Primary open angle glaucoma (POAG) of both eyes, mild stage 01/02/2018  . Benign hypertension with chronic kidney disease, stage V ([place]) 11/30/2017  . Proteinuria 11/30/2017  . Mouth burn, initial encounter 11/10/2017  . Vitamin D deficiency, unspecified 11/10/2017  . Erectile dysfunction associated with type 2 diabetes mellitus ([place]) 08/29/2017  . 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 ([place]) 08/16/2017  . Hypoglycemia 08/16/2017  . PCO (posterior capsular opacification), left 03/03/2017  . Hypermetropia of both eyes 03/03/2017  . Cocaine dependence in remission ([place]) 07/26/2016  . Herpes simplex infection 07/26/2016  . CKD stage 4 due to type 2 diabetes mellitus ([place]) 07/07/2016  . Noncompliance with diabetes treatment 07/07/2016  . 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 Combined arterial insufficiency and corporo-venous occlusive erectile dysfunction 03/04/2016  . Allergic  rhinitis with postnasal drip 02/24/2016  . 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  . Type 2 diabetes mellitus ([place]) 02/23/2016  . Essential hypertension 02/23/2016  . Musculoskeletal chest pain 02/23/2016  . 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[place]) 12/09/2015  . Polyneuropathy, unspecified 11/12/2013  . Constipation 03/23/2013  . Hallucinations 03/23/2013  .  Polyneuropathy in diabetes ([place]) 03/23/2013  . 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[place]) 08/30/2011  . Diabetes mellitus type II, controlled ([place]) 08/30/2011  . 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    Past Surgical History:  Procedure Laterality Date  . COLONOSCOPY    . IR DIALY SHUNT INTRO NEEDLE/INTRACATH INITIAL W/IMG LEFT Left 06/25/2020       Family History  Problem Relation Age of Onset  . Ovarian cancer Mother   . Diabetes Sister     Social History   Tobacco Use  . Smoking status: Former Research scientist (life sciences)  . Smokeless tobacco: Never Used  Vaping Use  . Vaping Use: Never used  Substance Use Topics  . Alcohol use: Yes  . Drug use: No    Home Medications Prior to Admission medications   Medication Sig Start Date End Date Taking? Authorizing Provider  amLODipine (NORVASC) 10 MG tablet Take 10 mg by mouth daily. 05/20/20  Yes [provider]  aspirin EC 81 MG tablet Take 81 mg by mouth daily.   Yes [provider]  atorvastatin (LIPITOR) 40 MG tablet Take 40 mg by mouth at bedtime. 05/20/20  Yes [provider]  BAQSIMI ONE PACK 3 MG/DOSE POWD Place 1 Dose into the nose daily as needed (low blood sugar). 07/06/20  Yes [provider]  calcitRIOL (ROCALTROL) 0.25 MCG capsule Take 0.25 mcg by mouth daily.   Yes [provider]  cholecalciferol (VITAMIN D3) 25 MCG (1000 UNIT) tablet Take 1,000 Units by mouth daily.   Yes [provider]  cyclobenzaprine (FLEXERIL) 10 MG tablet Take 10 mg by mouth 2 (two) times daily as needed for muscle spasms. 07/01/20  Yes [provider]  diclofenac Sodium (VOLTAREN) 1 % GEL Apply 2 g topically 4 (four) times daily. 08/04/20  Yes [provider]  diltiazem (TIAZAC) 180 MG 24 hr capsule Take 180 mg by mouth daily. 08/04/20  Yes [provider]  glipiZIDE (GLUCOTROL XL) 2.5 MG 24 hr tablet Take 2.5 mg by mouth daily. 11/11/20  Yes [provider]  hydrALAZINE (APRESOLINE) 25 MG  tablet Take 25 mg by mouth 3 (three) times daily.   Yes [provider]  insulin aspart (NOVOLOG) 100 UNIT/ML FlexPen Inject 0-7 Units into the skin 3 (three) times daily before meals. Sliding scale 07/03/19  Yes [provider]  latanoprost (XALATAN) 0.005 % ophthalmic solution Place 1 drop into both eyes at bedtime. 05/24/19  Yes [provider]  lidocaine-prilocaine (EMLA) cream Apply 1 application topically daily as needed (pain). 06/08/20  Yes [provider]  lisinopril (ZESTRIL) 5 MG tablet Take 5 mg by mouth daily. 05/20/20  Yes [provider]  Naftifine HCl 2 % GEL Apply between toes twice daily Patient taking differently: Apply 1 application topically daily. Apply between toes 09/08/20  Yes McDonald, Stephan Minister, DPM  sevelamer carbonate (RENVELA) 800 MG tablet Take 2,400 mg by mouth 3 (three) times daily with meals. 07/17/20  Yes [provider]  sildenafil (REVATIO) 20 MG tablet Take 40-60 mg by mouth daily as needed (ED).  10/10/19  Yes [provider]  sodium bicarbonate 650 MG  tablet Take 1 tablet (650 mg total) by mouth 2 (two) times daily. Patient taking differently: Take 650 mg by mouth daily. 11/08/19  Yes Santos-Sanchez, Merlene Morse, MD  TRADJENTA 5 MG TABS tablet Take 5 mg by mouth daily. 06/16/20  Yes [provider]  TRESIBA FLEXTOUCH 100 UNIT/ML SOPN FlexTouch Pen Inject 35 Units into the skin daily. 11/03/19  Yes [provider]  ACCU-CHEK AVIVA PLUS test strip 1 each by Other route as needed.  04/06/17   [provider]  ACCU-CHEK SOFTCLIX LANCETS lancets CHECK BLOOD SUGAR TWO TO THREE TIMES DAILY 04/05/17   [provider]  Alcohol Swabs (B-D SINGLE USE SWABS REGULAR) PADS USE TO CHECK BLOOD SUGAR AS DIRECTED 01/26/17   [provider]  Blood Glucose Monitoring Suppl (GLUCOCOM BLOOD GLUCOSE MONITOR) DEVI Use as directed.  Dx Code: E11.621 10/26/16   [provider]  Insulin Pen  Needle (FIFTY50 PEN NEEDLES) 31G X 8 MM MISC Use as directed per sliding scale.  Dx Code: E11.621 02/03/16   [provider]    Allergies    Dulaglutide  Review of Systems   Review of Systems  Constitutional: Negative for chills, diaphoresis, fatigue and fever.  HENT: Negative for congestion, rhinorrhea and sneezing.   Eyes: Negative.   Respiratory: Negative for cough, chest tightness and shortness of breath.   Cardiovascular: Negative for chest pain and leg swelling.  Gastrointestinal: Negative for abdominal pain, blood in stool, diarrhea, nausea and vomiting.  Genitourinary: Negative for difficulty urinating, flank pain, frequency and hematuria.  Musculoskeletal: Negative for arthralgias and back pain.       Intermittent shoulder pain  Skin: Negative for rash.  Neurological: Positive for light-headedness. Negative for dizziness, speech difficulty, weakness, numbness and headaches.    Physical Exam Updated Vital Signs BP (!) 146/78   Pulse 83   Temp 98.2 F (36.8 C) (Oral)   Resp 11   Ht '5\' 10"'$  (1.778 m)   Wt 72.1 kg   SpO2 98%   BMI 22.80 kg/m   Physical Exam Constitutional:      Appearance: He is well-developed and well-nourished.  HENT:     Head: Normocephalic and atraumatic.  Eyes:     Pupils: Pupils are equal, round, and reactive to light.  Cardiovascular:     Rate and Rhythm: Normal rate and regular rhythm.     Heart sounds: Normal heart sounds.  Pulmonary:     Effort: Pulmonary effort is normal. No respiratory distress.     Breath sounds: Normal breath sounds. No wheezing or rales.  Chest:     Chest wall: No tenderness.  Abdominal:     General: Bowel sounds are normal.     Palpations: Abdomen is soft.     Tenderness: There is no abdominal tenderness. There is no guarding or rebound.  Musculoskeletal:        General: No edema. Normal range of motion.     Cervical back: Normal range of motion and neck supple.     Comments: No pain on palpation or  range of motion of the left shoulder, he has fistula in the arm.  There is a thrill present.  Lymphadenopathy:     Cervical: No cervical adenopathy.  Skin:    General: Skin is warm and dry.     Findings: No rash.  Neurological:     Mental Status: He is alert and oriented to person, place, and time.     Comments: Motor 5/5 all extremities Sensation grossly  intact to LT all extremities Finger to Nose intact, no pronator drift CN II-XII grossly intact    Psychiatric:        Mood and Affect: Mood and affect normal.     ED Results / Procedures / Treatments   Labs (all labs ordered are listed, but only abnormal results are displayed) Labs Reviewed  BASIC METABOLIC PANEL - Abnormal; Notable for the following components:      Result Value   Potassium 5.2 (*)    Glucose, Bld 50 (*)    BUN 52 (*)    Creatinine, Ser 9.92 (*)    GFR, Estimated 5 (*)    Anion gap 16 (*)    All other components within normal limits  CBC - Abnormal; Notable for the following components:   RBC 3.81 (*)    Hemoglobin 12.5 (*)    HCT 37.0 (*)    RDW 17.0 (*)    All other components within normal limits  LACTIC ACID, PLASMA  LACTIC ACID, PLASMA  TROPONIN I (HIGH SENSITIVITY)  TROPONIN I (HIGH SENSITIVITY)    EKG EKG Interpretation  Date/Time:  Thursday December 17 2020 15:43:01 EST Ventricular Rate:  94 PR Interval:  160 QRS Duration: 84 QT Interval:  356 QTC Calculation: 445 R Axis:   84 Text Interpretation: Normal sinus rhythm Normal ECG since last tracing no significant change Confirmed by Malvin Johns [phone]) on 12/17/2020 5:57:08 PM   Radiology DG Chest 2 View  Result Date: 12/17/2020 CLINICAL DATA:  Recent syncopal episode EXAM: CHEST - 2 VIEW COMPARISON:  11/07/2019 FINDINGS: Cardiac shadow is within normal limits. The lungs are well aerated bilaterally. No focal infiltrate or effusion is seen. Degenerative changes of the thoracic spine are noted. IMPRESSION: No acute abnormality noted.  Electronically Signed   By: Inez Catalina M.D.   On: 12/17/2020 16:25    Procedures Procedures   Medications Ordered in ED Medications  sodium chloride 0.9 % bolus 250 mL (0 mLs Intravenous Stopped 12/17/20 1911)  sodium zirconium cyclosilicate (LOKELMA) packet 5 g (5 g Oral Given 12/17/20 2146)    ED Course  I have reviewed the triage vital signs and the nursing notes.  Pertinent labs & imaging results that were available during my care of the patient were reviewed by me and considered in my medical decision making (see chart for details).    MDM Rules/Calculators/A&P                          Patient is a 75 year old male who is a dialysis patient.  He had dialysis yesterday.  Today he was noted to be a little hypotensive and had some lightheadedness on standing.  He otherwise does not have any symptoms.  He does not have any signs of infection.  His lactate is normal.  He does not report any chest pain or shortness of breath.  He was given a small bolus of IV fluids at 250 cc and his blood pressures been stable since that time.  He is able to ambulate and feels better.  He has no ongoing dizziness.  His potassium was slightly elevated at 5.2.  I did give him a dose of Lokelma and he has dialysis tomorrow morning so he he is appropriate for discharge.  He does not have any abnormal EKG findings.  He was discharged home in good condition.  Return precautions were given. Final Clinical Impression(s) / ED Diagnoses Final diagnoses:  Hypotension, unspecified  hypotension type  Hyperkalemia    Rx / DC Orders ED Discharge Orders    None       Malvin Johns, MD 12/17/20 2211

## 2020-12-17 NOTE — Discharge Instructions (Addendum)
Make sure that you do not miss dialysis tomorrow.  Return here as needed if you have any worsening symptoms.

## 2020-12-17 NOTE — Progress Notes (Signed)
CSW was consulted to speak with Pt in lobby. Pt had already been d/c'ed when he requested transportation back to [employer] [facility] (his PCP) where his car was left when he arrived via EMS to ED. CSW arranged for transport via [employer].   12/17/20 2247  TOC ED Mini Assessment  TOC Time spent with patient (minutes): 10  PING Used in TOC Assessment No  Admission or Readmission Diverted Yes  Interventions which prevented an admission or readmission Transportation Screening  What brought you to the Emergency Department?  Hypotension  Barriers to Discharge No Barriers Identified  Barrier interventions Transportation coordinated  Means of departure [employer] [place])

## 2020-12-17 NOTE — ED Triage Notes (Signed)
Patient presents to the ED by EMS with c/o hypotensive episode while at [facility] urgent care. He is dialysis M/W/Fr. BP upon standing was 80 systoloic. Denies falls, sob, or chest pain.

## 2020-12-17 NOTE — ED Notes (Signed)
  Patient ambulated up and down the hallway with no assistance.  Patient had no complaints of dizziness, lightheadedness, or abnormal gait.  Patient tolerated well.

## 2021-01-12 ENCOUNTER — Ambulatory Visit (INDEPENDENT_AMBULATORY_CARE_PROVIDER_SITE_OTHER): Payer: Medicare Other | Admitting: Neurology

## 2021-01-12 ENCOUNTER — Telehealth: Payer: Self-pay | Admitting: Neurology

## 2021-01-12 ENCOUNTER — Encounter: Payer: Self-pay | Admitting: Neurology

## 2021-01-12 VITALS — BP 93/63 | HR 92 | Ht 70.0 in | Wt 161.5 lb

## 2021-01-12 DIAGNOSIS — I639 Cerebral infarction, unspecified: Secondary | ICD-10-CM

## 2021-01-12 DIAGNOSIS — E1142 Type 2 diabetes mellitus with diabetic polyneuropathy: Secondary | ICD-10-CM | POA: Diagnosis not present
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
## 2021-01-12 NOTE — Telephone Encounter (Signed)
[employer] medicare/medicaid order sent to [facility]. No auth they will reach out to the patient to schedule.  °

## 2021-02-03 ENCOUNTER — Other Ambulatory Visit: Payer: Medicare Other

## 2021-02-23 ENCOUNTER — Other Ambulatory Visit: Payer: Medicare Other

## 2021-02-25 ENCOUNTER — Encounter: Payer: Self-pay | Admitting: Neurology

## 2021-04-13 ENCOUNTER — Ambulatory Visit (INDEPENDENT_AMBULATORY_CARE_PROVIDER_SITE_OTHER): Payer: Medicare Other | Admitting: Neurology

## 2021-04-13 ENCOUNTER — Telehealth: Payer: Self-pay | Admitting: Neurology

## 2021-04-13 ENCOUNTER — Encounter: Payer: Self-pay | Admitting: Neurology

## 2021-04-13 ENCOUNTER — Other Ambulatory Visit: Payer: Self-pay

## 2021-04-13 VITALS — BP 126/72 | HR 91 | Ht 70.0 in | Wt 158.5 lb

## 2021-04-13 DIAGNOSIS — E1142 Type 2 diabetes mellitus with diabetic polyneuropathy: Secondary | ICD-10-CM | POA: Diagnosis not present

## 2021-04-13 DIAGNOSIS — I639 Cerebral infarction, unspecified: Secondary | ICD-10-CM

## 2021-04-13 DIAGNOSIS — R413 Other amnesia: Secondary | ICD-10-CM
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
## 2021-04-13 NOTE — Telephone Encounter (Signed)
error 

## 2021-04-14 ENCOUNTER — Telehealth: Payer: Self-pay | Admitting: Neurology

## 2021-04-14 LAB — VITAMIN B12: Vitamin B-12: 1325 pg/mL — ABNORMAL HIGH (ref 232–1245)

## 2021-04-14 LAB — TSH: TSH: 1.45 u[IU]/mL (ref 0.450–4.500)

## 2021-04-14 LAB — FERRITIN: Ferritin: 1095 ng/mL — ABNORMAL HIGH (ref 30–400)

## 2021-04-14 LAB — RPR: RPR Ser Ql: NONREACTIVE

## 2021-04-14 LAB — HIV ANTIBODY (ROUTINE TESTING W REFLEX): HIV Screen 4th Generation wRfx: NONREACTIVE

## 2021-04-14 NOTE — Telephone Encounter (Signed)
I was unable to get in touch with the patient I have spoken to his dgt on DPR. She verbalized understanding of the lab results and will make sure he follows up w/ PCP. Provided our number for him to call back with any questions.

## 2021-04-14 NOTE — Telephone Encounter (Signed)
Please call patient, laboratory evaluation showed significantly elevated ferritin, which can be an inflammatory markers, rest of the laboratory evaluation showed no significant abnormality,  I have forwarded the lab results to his pcp Cipriano Mile, NP he may contact her for repeat laboratory evaluation

## 2021-04-17 ENCOUNTER — Other Ambulatory Visit: Payer: Self-pay

## 2021-04-17 ENCOUNTER — Ambulatory Visit
Admission: RE | Admit: 2021-04-17 | Discharge: 2021-04-17 | Disposition: A | Discharge status: Home / Self Care | Payer: Medicare Other | Source: Ambulatory Visit | Attending: Neurology | Admitting: Neurology

## 2021-04-17 DIAGNOSIS — I639 Cerebral infarction, unspecified: Secondary | ICD-10-CM | POA: Diagnosis not present

## 2021-04-17 DIAGNOSIS — E1142 Type 2 diabetes mellitus with diabetic polyneuropathy: Secondary | ICD-10-CM

## 2021-04-17 DIAGNOSIS — R413 Other amnesia: Secondary | ICD-10-CM | POA: Diagnosis not present

## 2021-04-20 ENCOUNTER — Telehealth: Payer: Self-pay | Admitting: Neurology
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
## 2021-04-20 NOTE — Telephone Encounter (Signed)
I spoke to his daughter on [place]. Provided her with the MRI brain results. She scheduled a follow up for him in a time slot that works with her schedule.

## 2021-04-26 NOTE — Telephone Encounter (Signed)
error 

## 2021-05-06 ENCOUNTER — Ambulatory Visit: Payer: Self-pay | Admitting: Neurology

## 2021-05-06 ENCOUNTER — Telehealth: Payer: Self-pay | Admitting: Neurology

## 2021-05-06 NOTE — Telephone Encounter (Signed)
Pt cancelled appt due to not able to make appt.

## 2021-05-28 ENCOUNTER — Emergency Department (HOSPITAL_COMMUNITY): Payer: Medicare Other

## 2021-05-28 ENCOUNTER — Inpatient Hospital Stay (HOSPITAL_COMMUNITY)
Admission: EM | Admit: 2021-05-28 | Discharge: 2021-06-09 | DRG: 871 | Disposition: A | Discharge status: Skilled Nursing Facility | Payer: Medicare Other | Attending: Internal Medicine | Admitting: Internal Medicine

## 2021-05-28 ENCOUNTER — Encounter (HOSPITAL_COMMUNITY): Payer: Self-pay

## 2021-05-28 ENCOUNTER — Inpatient Hospital Stay (HOSPITAL_COMMUNITY): Payer: Medicare Other

## 2021-05-28 DIAGNOSIS — Z8673 Personal history of transient ischemic attack (TIA), and cerebral infarction without residual deficits: Secondary | ICD-10-CM | POA: Diagnosis not present

## 2021-05-28 DIAGNOSIS — Z87891 Personal history of nicotine dependence: Secondary | ICD-10-CM | POA: Diagnosis not present

## 2021-05-28 DIAGNOSIS — I669 Occlusion and stenosis of unspecified cerebral artery: Secondary | ICD-10-CM | POA: Diagnosis not present

## 2021-05-28 DIAGNOSIS — E1310 Other specified diabetes mellitus with ketoacidosis without coma: Secondary | ICD-10-CM

## 2021-05-28 DIAGNOSIS — Z888 Allergy status to other drugs, medicaments and biological substances status: Secondary | ICD-10-CM

## 2021-05-28 DIAGNOSIS — A4102 Sepsis due to Methicillin resistant Staphylococcus aureus: Secondary | ICD-10-CM | POA: Diagnosis present

## 2021-05-28 DIAGNOSIS — E785 Hyperlipidemia, unspecified: Secondary | ICD-10-CM | POA: Diagnosis present

## 2021-05-28 DIAGNOSIS — N529 Male erectile dysfunction, unspecified: Secondary | ICD-10-CM | POA: Diagnosis present

## 2021-05-28 DIAGNOSIS — R739 Hyperglycemia, unspecified: Secondary | ICD-10-CM

## 2021-05-28 DIAGNOSIS — Z20822 Contact with and (suspected) exposure to covid-19: Secondary | ICD-10-CM | POA: Diagnosis present

## 2021-05-28 DIAGNOSIS — N2581 Secondary hyperparathyroidism of renal origin: Secondary | ICD-10-CM | POA: Diagnosis present

## 2021-05-28 DIAGNOSIS — Z794 Long term (current) use of insulin: Secondary | ICD-10-CM

## 2021-05-28 DIAGNOSIS — Z79899 Other long term (current) drug therapy: Secondary | ICD-10-CM

## 2021-05-28 DIAGNOSIS — Z8619 Personal history of other infectious and parasitic diseases: Secondary | ICD-10-CM

## 2021-05-28 DIAGNOSIS — J988 Other specified respiratory disorders: Secondary | ICD-10-CM | POA: Diagnosis not present

## 2021-05-28 DIAGNOSIS — Z978 Presence of other specified devices: Secondary | ICD-10-CM

## 2021-05-28 DIAGNOSIS — I517 Cardiomegaly: Secondary | ICD-10-CM | POA: Diagnosis not present

## 2021-05-28 DIAGNOSIS — N400 Enlarged prostate without lower urinary tract symptoms: Secondary | ICD-10-CM | POA: Diagnosis present

## 2021-05-28 DIAGNOSIS — J9601 Acute respiratory failure with hypoxia: Secondary | ICD-10-CM | POA: Diagnosis not present

## 2021-05-28 DIAGNOSIS — I120 Hypertensive chronic kidney disease with stage 5 chronic kidney disease or end stage renal disease: Secondary | ICD-10-CM | POA: Diagnosis present

## 2021-05-28 DIAGNOSIS — E440 Moderate protein-calorie malnutrition: Secondary | ICD-10-CM | POA: Diagnosis present

## 2021-05-28 DIAGNOSIS — G40901 Epilepsy, unspecified, not intractable, with status epilepticus: Secondary | ICD-10-CM

## 2021-05-28 DIAGNOSIS — R509 Fever, unspecified: Secondary | ICD-10-CM | POA: Diagnosis not present

## 2021-05-28 DIAGNOSIS — E1122 Type 2 diabetes mellitus with diabetic chronic kidney disease: Secondary | ICD-10-CM | POA: Diagnosis present

## 2021-05-28 DIAGNOSIS — R4182 Altered mental status, unspecified: Secondary | ICD-10-CM | POA: Diagnosis present

## 2021-05-28 DIAGNOSIS — E11319 Type 2 diabetes mellitus with unspecified diabetic retinopathy without macular edema: Secondary | ICD-10-CM | POA: Diagnosis present

## 2021-05-28 DIAGNOSIS — I161 Hypertensive emergency: Secondary | ICD-10-CM | POA: Diagnosis present

## 2021-05-28 DIAGNOSIS — G9341 Metabolic encephalopathy: Secondary | ICD-10-CM | POA: Diagnosis present

## 2021-05-28 DIAGNOSIS — R7881 Bacteremia: Secondary | ICD-10-CM | POA: Diagnosis not present

## 2021-05-28 DIAGNOSIS — I639 Cerebral infarction, unspecified: Secondary | ICD-10-CM | POA: Diagnosis not present

## 2021-05-28 DIAGNOSIS — Z4659 Encounter for fitting and adjustment of other gastrointestinal appliance and device: Secondary | ICD-10-CM

## 2021-05-28 DIAGNOSIS — N186 End stage renal disease: Secondary | ICD-10-CM | POA: Diagnosis present

## 2021-05-28 DIAGNOSIS — B9562 Methicillin resistant Staphylococcus aureus infection as the cause of diseases classified elsewhere: Secondary | ICD-10-CM | POA: Diagnosis not present

## 2021-05-28 DIAGNOSIS — Z681 Body mass index (BMI) 19 or less, adult: Secondary | ICD-10-CM | POA: Diagnosis not present

## 2021-05-28 DIAGNOSIS — Z992 Dependence on renal dialysis: Secondary | ICD-10-CM | POA: Diagnosis not present

## 2021-05-28 DIAGNOSIS — E11649 Type 2 diabetes mellitus with hypoglycemia without coma: Secondary | ICD-10-CM | POA: Diagnosis not present

## 2021-05-28 DIAGNOSIS — I4891 Unspecified atrial fibrillation: Secondary | ICD-10-CM | POA: Diagnosis present

## 2021-05-28 DIAGNOSIS — E559 Vitamin D deficiency, unspecified: Secondary | ICD-10-CM | POA: Diagnosis present

## 2021-05-28 DIAGNOSIS — Z452 Encounter for adjustment and management of vascular access device: Secondary | ICD-10-CM

## 2021-05-28 DIAGNOSIS — I10 Essential (primary) hypertension: Secondary | ICD-10-CM | POA: Diagnosis not present

## 2021-05-28 DIAGNOSIS — E860 Dehydration: Secondary | ICD-10-CM | POA: Diagnosis present

## 2021-05-28 DIAGNOSIS — I76 Septic arterial embolism: Secondary | ICD-10-CM | POA: Diagnosis not present

## 2021-05-28 DIAGNOSIS — Z7984 Long term (current) use of oral hypoglycemic drugs: Secondary | ICD-10-CM

## 2021-05-28 DIAGNOSIS — R402432 Glasgow coma scale score 3-8, at arrival to emergency department: Secondary | ICD-10-CM | POA: Diagnosis not present

## 2021-05-28 DIAGNOSIS — E1110 Type 2 diabetes mellitus with ketoacidosis without coma: Secondary | ICD-10-CM | POA: Diagnosis present

## 2021-05-28 DIAGNOSIS — Z7982 Long term (current) use of aspirin: Secondary | ICD-10-CM

## 2021-05-28 LAB — GLUCOSE, CAPILLARY
Glucose-Capillary: >600 mg/dL (ref 70–99)
Glucose-Capillary: 535 mg/dL (ref 70–99)
Glucose-Capillary: 464 mg/dL — ABNORMAL HIGH (ref 70–99)
Glucose-Capillary: 426 mg/dL — ABNORMAL HIGH (ref 70–99)
Glucose-Capillary: 304 mg/dL — ABNORMAL HIGH (ref 70–99)
Glucose-Capillary: 247 mg/dL — ABNORMAL HIGH (ref 70–99)
Glucose-Capillary: 175 mg/dL — ABNORMAL HIGH (ref 70–99)
Glucose-Capillary: 160 mg/dL — ABNORMAL HIGH (ref 70–99)

## 2021-05-28 LAB — DIFFERENTIAL
Abs Immature Granulocytes: 0.02 10*3/uL (ref 0.00–0.07)
Basophils Absolute: 0.1 10*3/uL (ref 0.0–0.1)
Basophils Relative: 1 %
Eosinophils Absolute: 0 10*3/uL (ref 0.0–0.5)
Eosinophils Relative: 1 %
Immature Granulocytes: 0 %
Lymphocytes Relative: 6 %
Lymphs Abs: 0.4 10*3/uL — ABNORMAL LOW (ref 0.7–4.0)
Monocytes Absolute: 0.4 10*3/uL (ref 0.1–1.0)
Monocytes Relative: 5 %
Neutro Abs: 7.1 10*3/uL (ref 1.7–7.7)
Neutrophils Relative %: 87 %

## 2021-05-28 LAB — I-STAT VENOUS BLOOD GAS, ED
Acid-base deficit: 4 mmol/L — ABNORMAL HIGH (ref 0.0–2.0)
Bicarbonate: 20.4 mmol/L (ref 20.0–28.0)
Calcium, Ion: 0.97 mmol/L — ABNORMAL LOW (ref 1.15–1.40)
HCT: 38 % — ABNORMAL LOW (ref 39.0–52.0)
Hemoglobin: 12.9 g/dL — ABNORMAL LOW (ref 13.0–17.0)
O2 Saturation: 95 %
Potassium: 4.1 mmol/L (ref 3.5–5.1)
Sodium: 132 mmol/L — ABNORMAL LOW (ref 135–145)
TCO2: 21 mmol/L — ABNORMAL LOW (ref 22–32)
pCO2, Ven: 33.4 mmHg — ABNORMAL LOW (ref 44.0–60.0)
pH, Ven: 7.394 (ref 7.250–7.430)
pO2, Ven: 77 mmHg — ABNORMAL HIGH (ref 32.0–45.0)

## 2021-05-28 LAB — CBC
HCT: 35.2 % — ABNORMAL LOW (ref 39.0–52.0)
Hemoglobin: 11.9 g/dL — ABNORMAL LOW (ref 13.0–17.0)
MCH: 31 pg (ref 26.0–34.0)
MCHC: 33.8 g/dL (ref 30.0–36.0)
MCV: 91.7 fL (ref 80.0–100.0)
Platelets: 146 10*3/uL — ABNORMAL LOW (ref 150–400)
RBC: 3.84 MIL/uL — ABNORMAL LOW (ref 4.22–5.81)
RDW: 13.4 % (ref 11.5–15.5)
WBC: 8 10*3/uL (ref 4.0–10.5)
nRBC: 0 % (ref 0.0–0.2)

## 2021-05-28 LAB — I-STAT ARTERIAL BLOOD GAS, ED
Acid-base deficit: 1 mmol/L (ref 0.0–2.0)
Bicarbonate: 23.5 mmol/L (ref 20.0–28.0)
Calcium, Ion: 1 mmol/L — ABNORMAL LOW (ref 1.15–1.40)
HCT: 35 % — ABNORMAL LOW (ref 39.0–52.0)
Hemoglobin: 11.9 g/dL — ABNORMAL LOW (ref 13.0–17.0)
O2 Saturation: 100 %
Patient temperature: 99.7
Potassium: 3.9 mmol/L (ref 3.5–5.1)
Sodium: 130 mmol/L — ABNORMAL LOW (ref 135–145)
TCO2: 25 mmol/L (ref 22–32)
pCO2 arterial: 38.7 mmHg (ref 32.0–48.0)
pH, Arterial: 7.394 (ref 7.350–7.450)
pO2, Arterial: 428 mmHg — ABNORMAL HIGH (ref 83.0–108.0)

## 2021-05-28 LAB — COMPREHENSIVE METABOLIC PANEL
ALT: 20 U/L (ref 0–44)
AST: 36 U/L (ref 15–41)
Albumin: 4.1 g/dL (ref 3.5–5.0)
Alkaline Phosphatase: 69 U/L (ref 38–126)
Anion gap: 16 — ABNORMAL HIGH (ref 5–15)
BUN: 58 mg/dL — ABNORMAL HIGH (ref 8–23)
CO2: 21 mmol/L — ABNORMAL LOW (ref 22–32)
Calcium: 8.1 mg/dL — ABNORMAL LOW (ref 8.9–10.3)
Chloride: 92 mmol/L — ABNORMAL LOW (ref 98–111)
Creatinine, Ser: 9.02 mg/dL — ABNORMAL HIGH (ref 0.61–1.24)
GFR, Estimated: 6 mL/min — ABNORMAL LOW (ref >60)
Glucose, Bld: 777 mg/dL (ref 70–99)
Potassium: 4.7 mmol/L (ref 3.5–5.1)
Sodium: 129 mmol/L — ABNORMAL LOW (ref 135–145)
Total Bilirubin: 0.8 mg/dL (ref 0.3–1.2)
Total Protein: 7.4 g/dL (ref 6.5–8.1)

## 2021-05-28 LAB — CBG MONITORING, ED
Glucose-Capillary: >600 mg/dL (ref 70–99)
Glucose-Capillary: >600 mg/dL (ref 70–99)

## 2021-05-28 LAB — MRSA NEXT GEN BY PCR, NASAL: MRSA by PCR Next Gen: DETECTED — AB

## 2021-05-28 LAB — I-STAT CHEM 8, ED
BUN: 72 mg/dL — ABNORMAL HIGH (ref 8–23)
Calcium, Ion: 0.99 mmol/L — ABNORMAL LOW (ref 1.15–1.40)
Chloride: 96 mmol/L — ABNORMAL LOW (ref 98–111)
Creatinine, Ser: 9.6 mg/dL — ABNORMAL HIGH (ref 0.61–1.24)
Glucose, Bld: >700 mg/dL (ref 70–99)
HCT: 37 % — ABNORMAL LOW (ref 39.0–52.0)
Hemoglobin: 12.6 g/dL — ABNORMAL LOW (ref 13.0–17.0)
Potassium: 4.8 mmol/L (ref 3.5–5.1)
Sodium: 130 mmol/L — ABNORMAL LOW (ref 135–145)
TCO2: 25 mmol/L (ref 22–32)

## 2021-05-28 LAB — APTT: aPTT: 28 seconds (ref 24–36)

## 2021-05-28 LAB — BETA-HYDROXYBUTYRIC ACID: Beta-Hydroxybutyric Acid: 0.85 mmol/L — ABNORMAL HIGH (ref 0.05–0.27)

## 2021-05-28 LAB — PROTIME-INR
INR: 1.1 (ref 0.8–1.2)
Prothrombin Time: 14.5 seconds (ref 11.4–15.2)

## 2021-05-28 LAB — RESP PANEL BY RT-PCR (FLU A&B, COVID) ARPGX2
Influenza A by PCR: NEGATIVE
Influenza B by PCR: NEGATIVE
SARS Coronavirus 2 by RT PCR: NEGATIVE

## 2021-05-28 LAB — ETHANOL: Alcohol, Ethyl (B): <10 mg/dL (ref <10)

## 2021-05-28 IMAGING — CT CT HEAD CODE STROKE
3 of 7 series · 16 of 47 positions shown, 19 images · non-contrast
Comparison: [DATE]

CLINICAL DATA: Code stroke.  Neuro deficit, acute, stroke suspected

EXAM:
CT HEAD WITHOUT CONTRAST
TECHNIQUE: Contiguous axial images were obtained from the base of the skull
through the vertex without intravenous contrast.

[Series 2: head 5.0 st · axial · 0.44mm/px · z∈[-78,+57]mm · 11 of 31 slices shown, 14 images]
[im 2/31  brain]
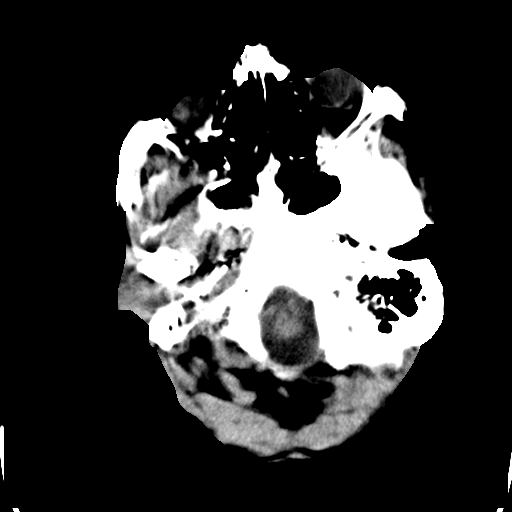
[im 2/31  bone]
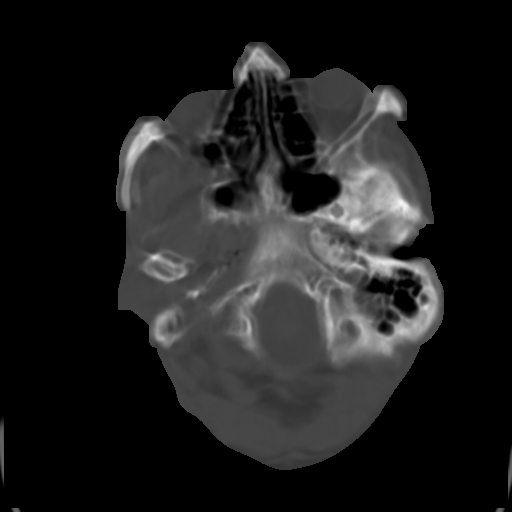
[im 6/31  brain]
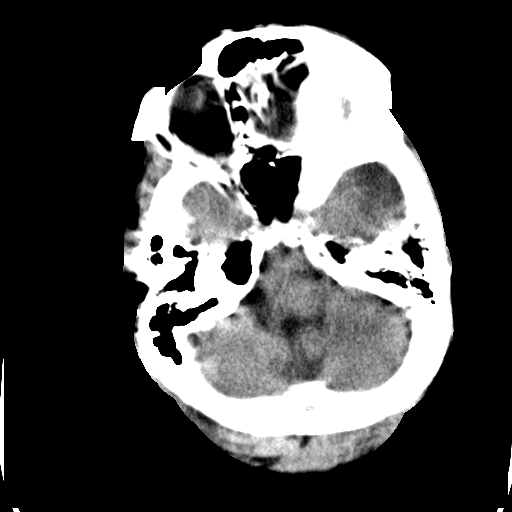
[im 8/31  brain]
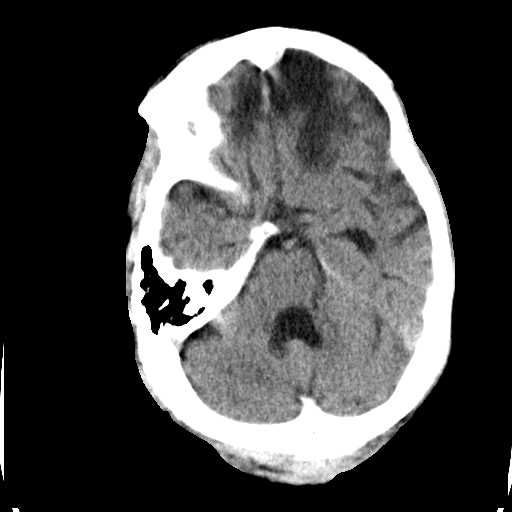
[im 10/31  brain]
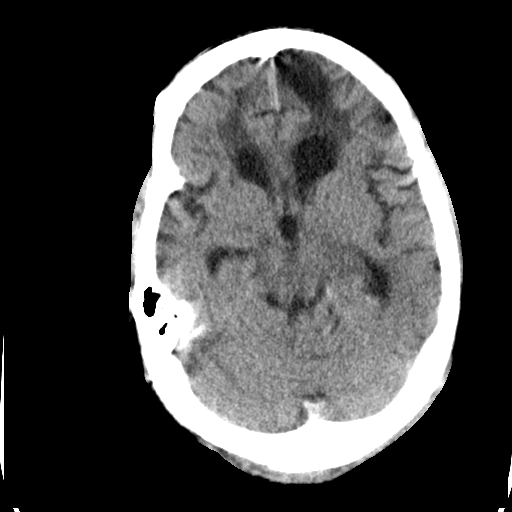
[im 14/31  brain]
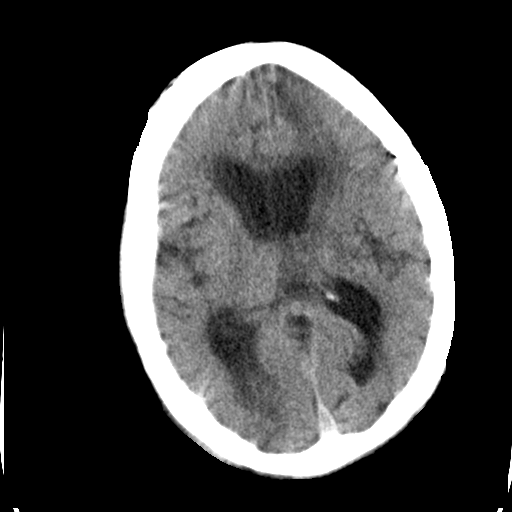
[im 14/31  bone]
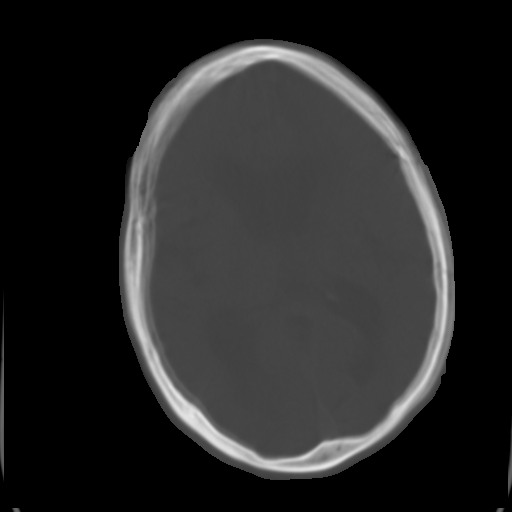
[im 16/31  brain]
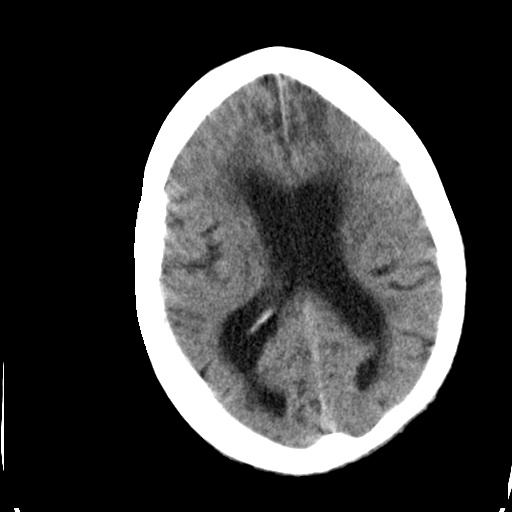
[im 17/31  brain]
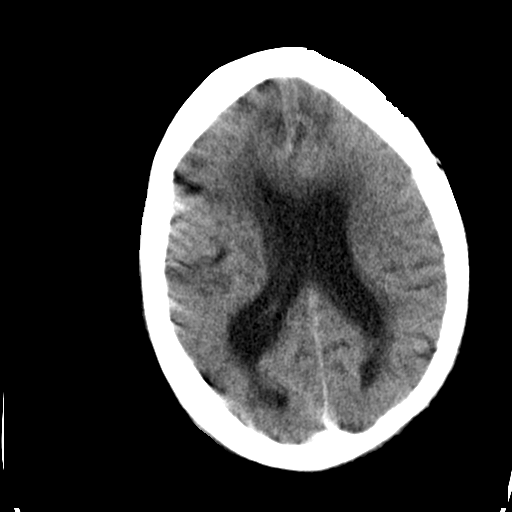
[im 21/31  brain]
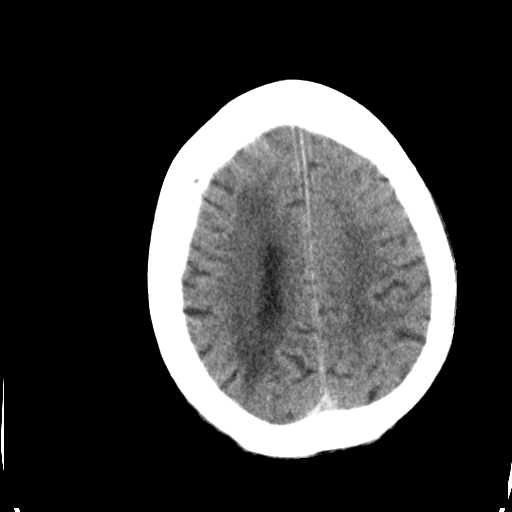
[im 23/31  brain]
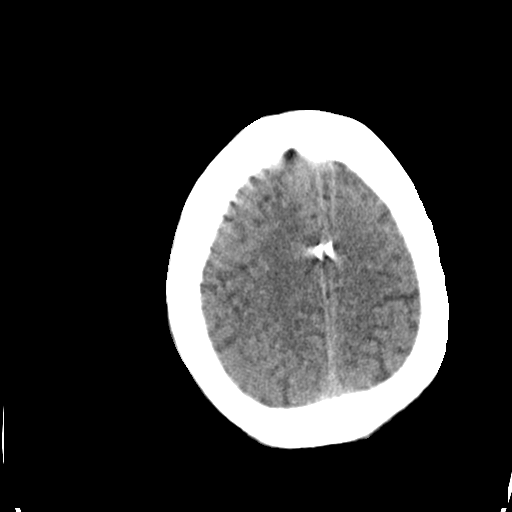
[im 23/31  bone]
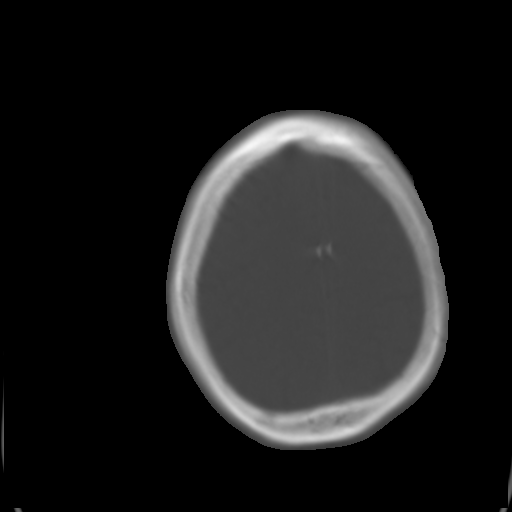
[im 25/31  brain]
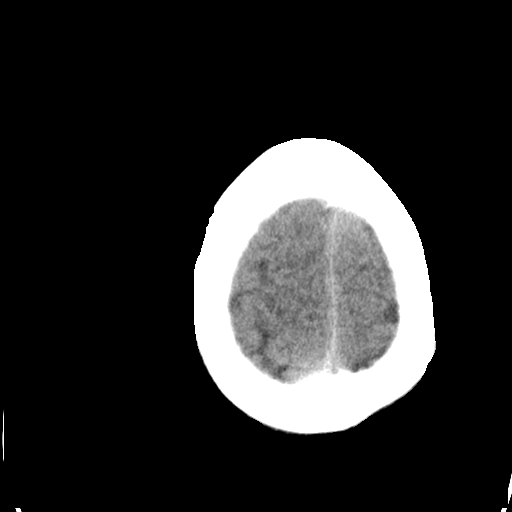
[im 29/31  brain]
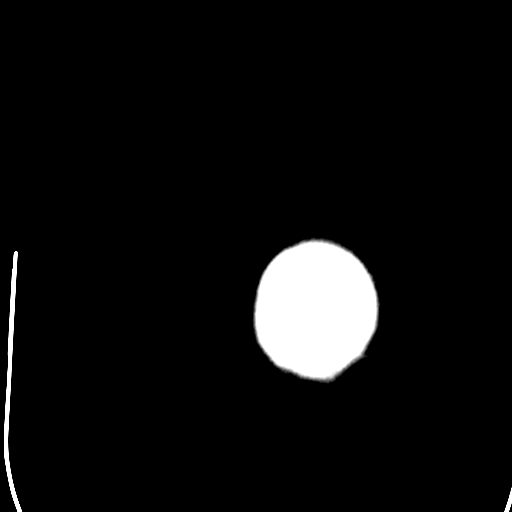

[Series 4: head 3.0 cor st · coronal · 0.33mm/px · 3 of 67 slices shown]
[im 17/67  brain]
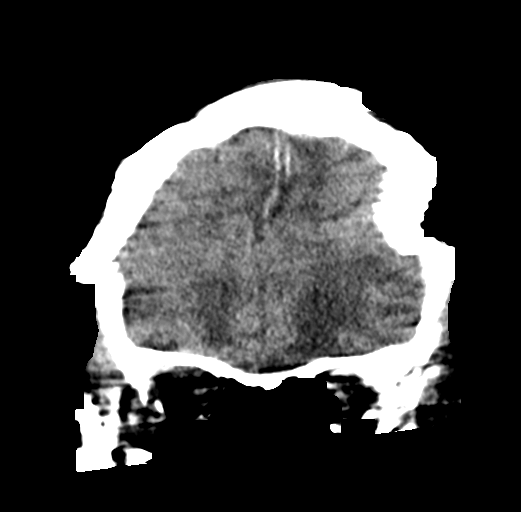
[im 34/67  brain]
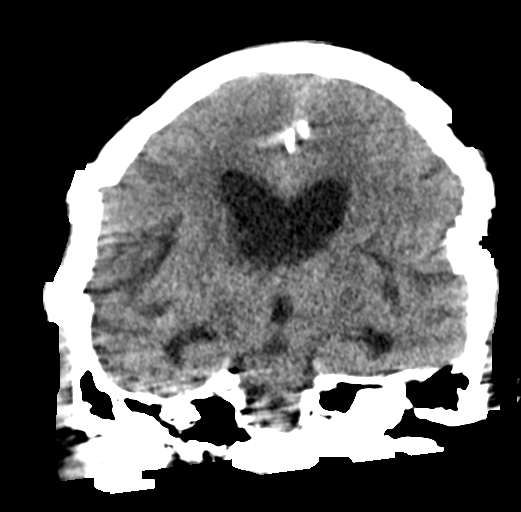
[im 50/67  brain]
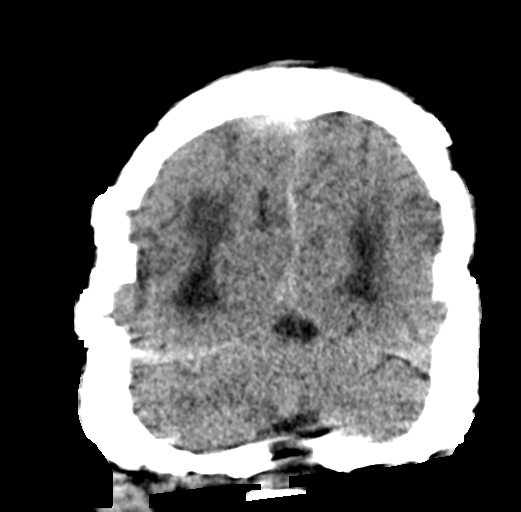

[Series 5: head 3.0 sag st · sagittal · 0.32mm/px · 2 of 54 slices shown]
[im 18/54  brain]
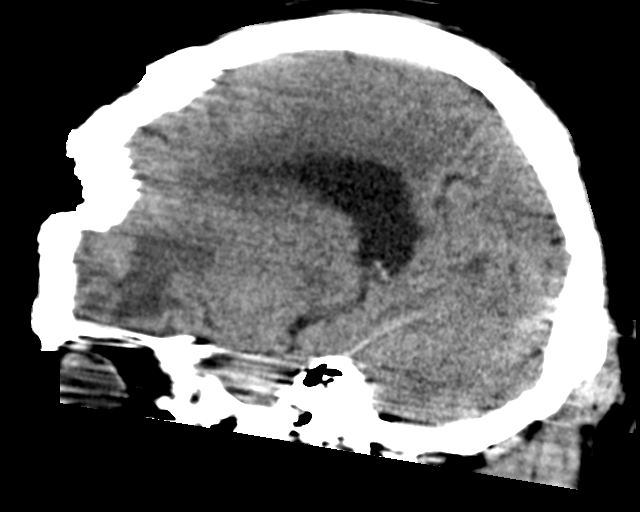
[im 36/54  brain]
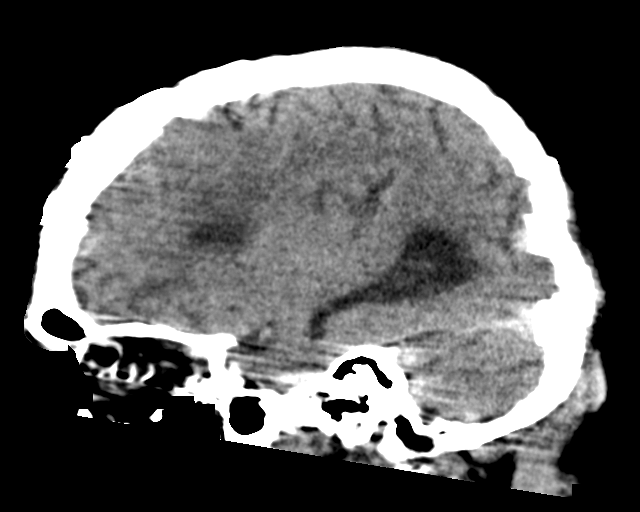

[16 of 47 positions shown; findings below may reference images not displayed]

FINDINGS: There is significant motion artifact.

Brain: No acute intracranial hemorrhage or mass effect. No definite
new loss of gray-white differentiation. Areas of encephalomalacia
are again identified in bilateral anterior inferior frontal and left
temporal lobes. Additional patchy and confluent areas
hypoattenuation in the supratentorial white matter probably reflects
stable chronic microvascular ischemic changes. Prominence of the
ventricles and sulci reflects stable parenchymal volume loss. No
extra-axial collection.

Vascular: Hyperdense vessel.

Skull: Unremarkable.

Sinuses/Orbits: No acute abnormality.

Other: None

ASPECTS (Alberta Stroke Program Early CT Score)

- Ganglionic level infarction (caudate, lentiform nuclei, internal
capsule, insula, M1-M3 cortex): 7

- Supraganglionic infarction (M4-M6 cortex): 3

Total score (0-10 with 10 being normal): 10
IMPRESSION: Significantly motion degraded.

There is no acute intracranial hemorrhage or definite evidence of
acute infarction. ASPECT score is 10.

These results were communicated to Dr. TOROLI at [DATE] on
[DATE] by text page via the [employer] messaging system.

## 2021-05-28 IMAGING — DX DG CHEST 1V PORT
1 series · 2 of 2 positions shown · non-contrast
Comparison: Radiograph earlier today.

CLINICAL DATA: Central line placement.

EXAM:
PORTABLE CHEST 1 VIEW

[Series 1: chest · 0.14mm/px · 2 of 2 slices shown]
[im 1/2]
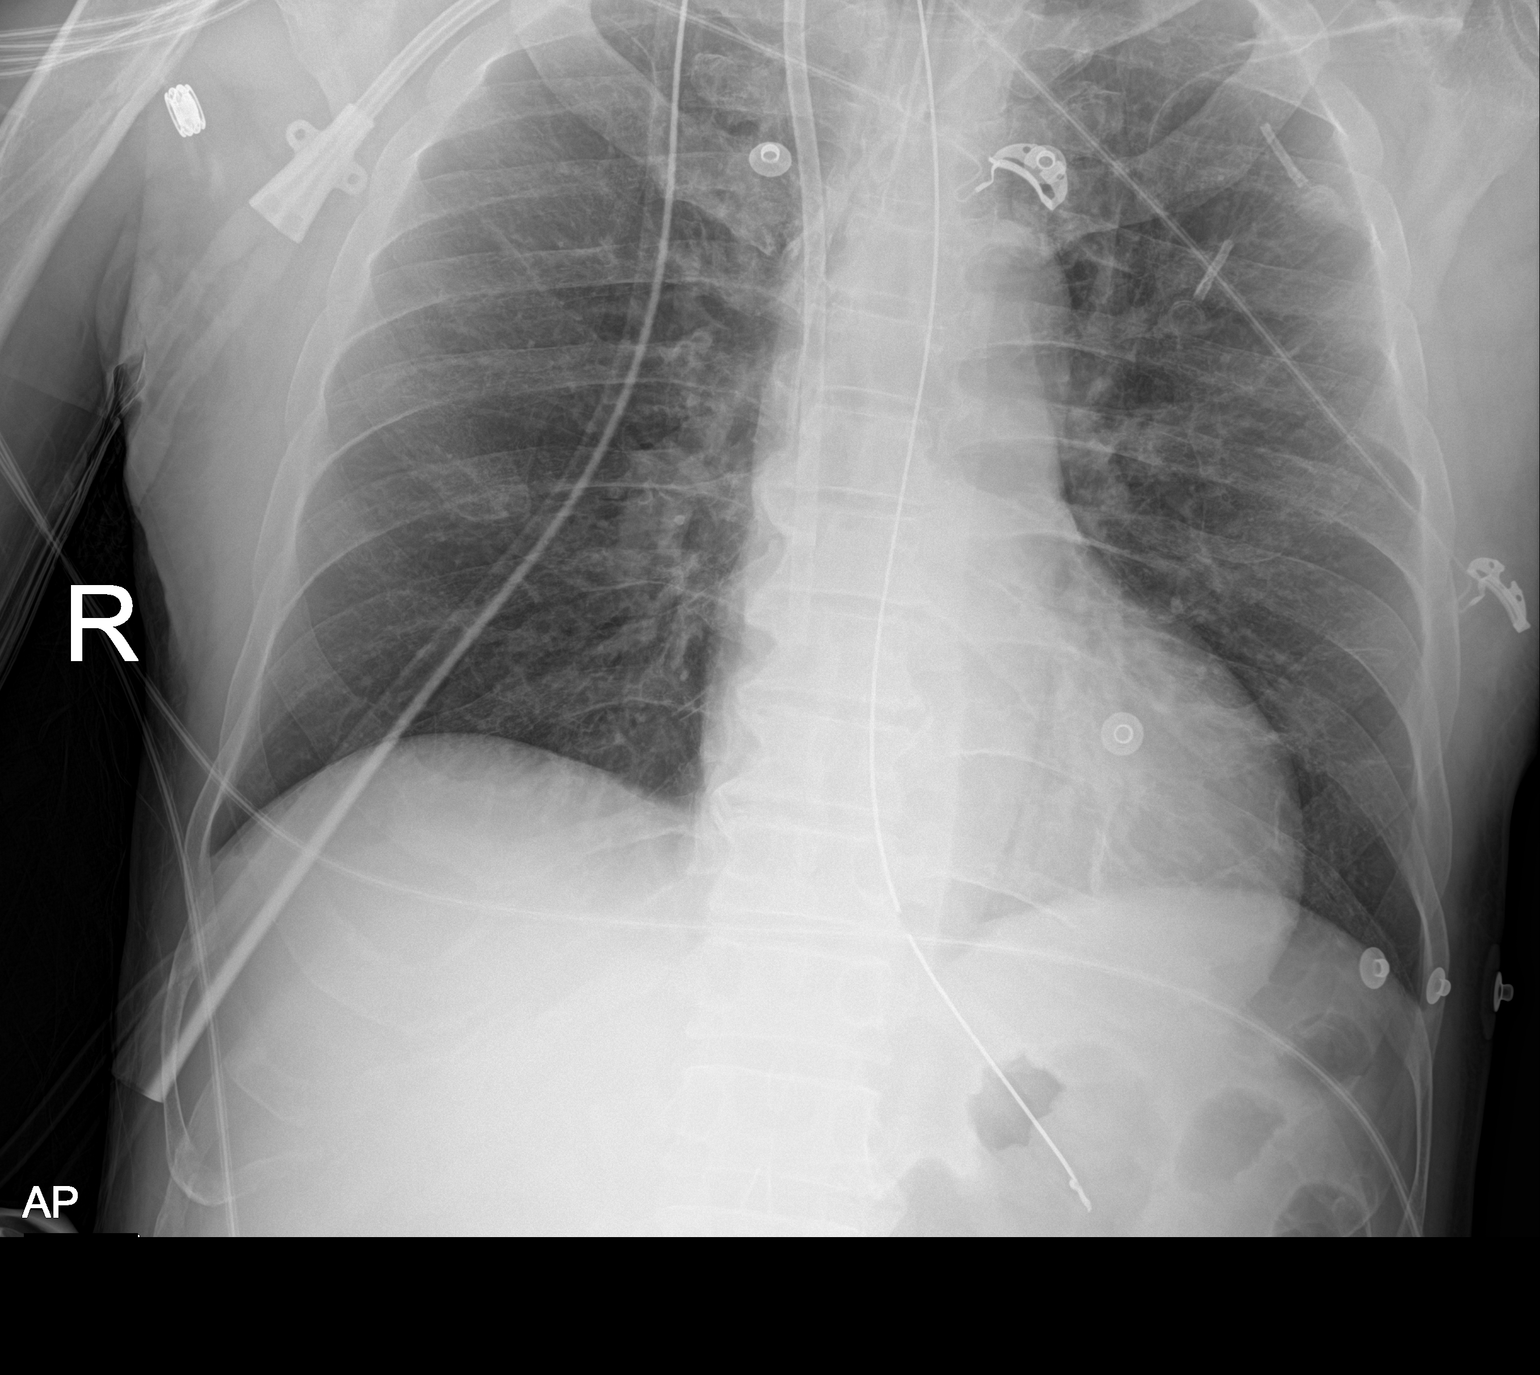
[im 2/2]
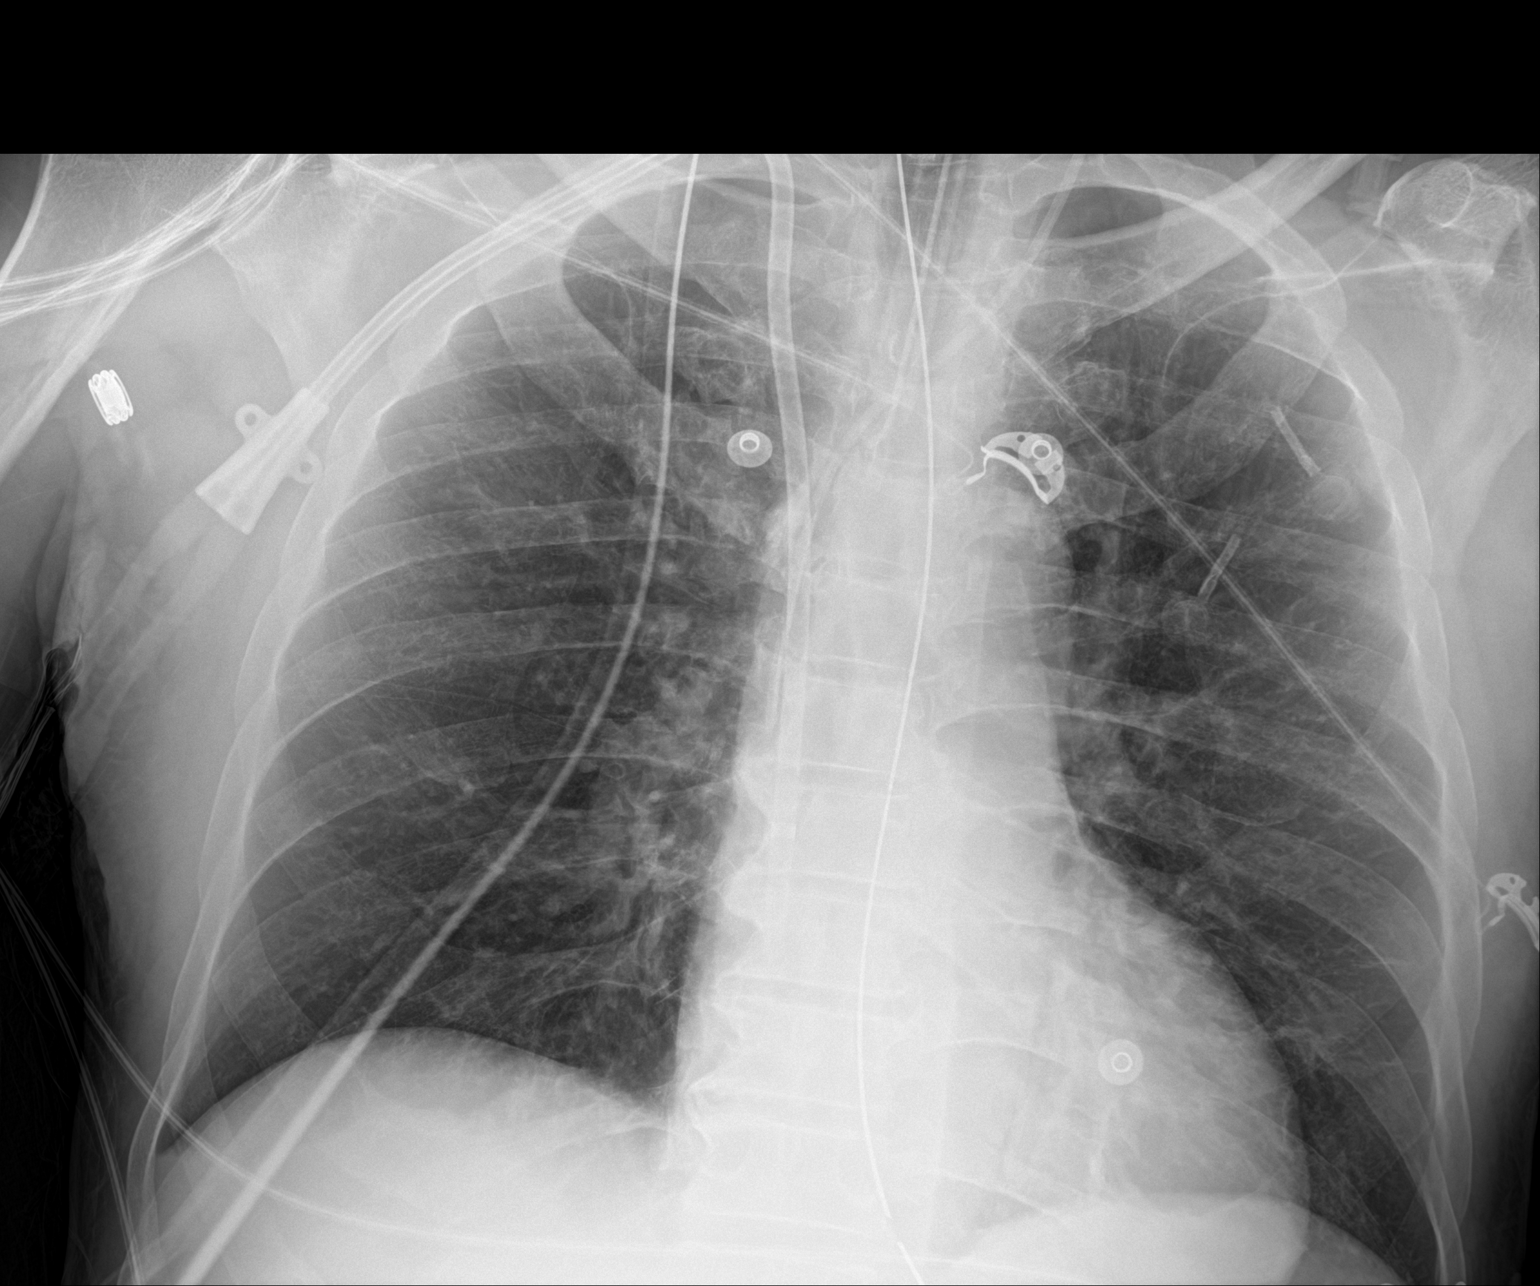

[2 of 2 positions shown; findings below may reference images not displayed]

FINDINGS: There is a new left internal jugular central venous catheter with
tip overlying the mid distal SVC. No pneumothorax. Endotracheal tube
remains in place. There is a new enteric tube with tip stomach, the
side port is in the region of the gastroesophageal junction.
Previous right-sided dialysis catheter is unchanged. Heart is normal
in size. Stable mediastinal contours. Improving left basilar
atelectasis. No new airspace disease. No pleural effusion.
IMPRESSION: 1. New left internal jugular central venous catheter with tip
overlying the mid distal SVC. No pneumothorax.
2. New enteric tube with tip in the stomach, side port is in the
region of the gastroesophageal junction. Recommend advancement of at
least 3 cm for optimal placement.
3. Other support apparatus unchanged.
4. Improving left basilar atelectasis.

## 2021-05-28 IMAGING — DX DG CHEST 1V PORT
1 series · 1 of 1 positions shown · non-contrast
Comparison: Portable exam [VU] hours compared to [DATE]

CLINICAL DATA: Intubation, post code

EXAM:
PORTABLE CHEST 1 VIEW

[chest ap]
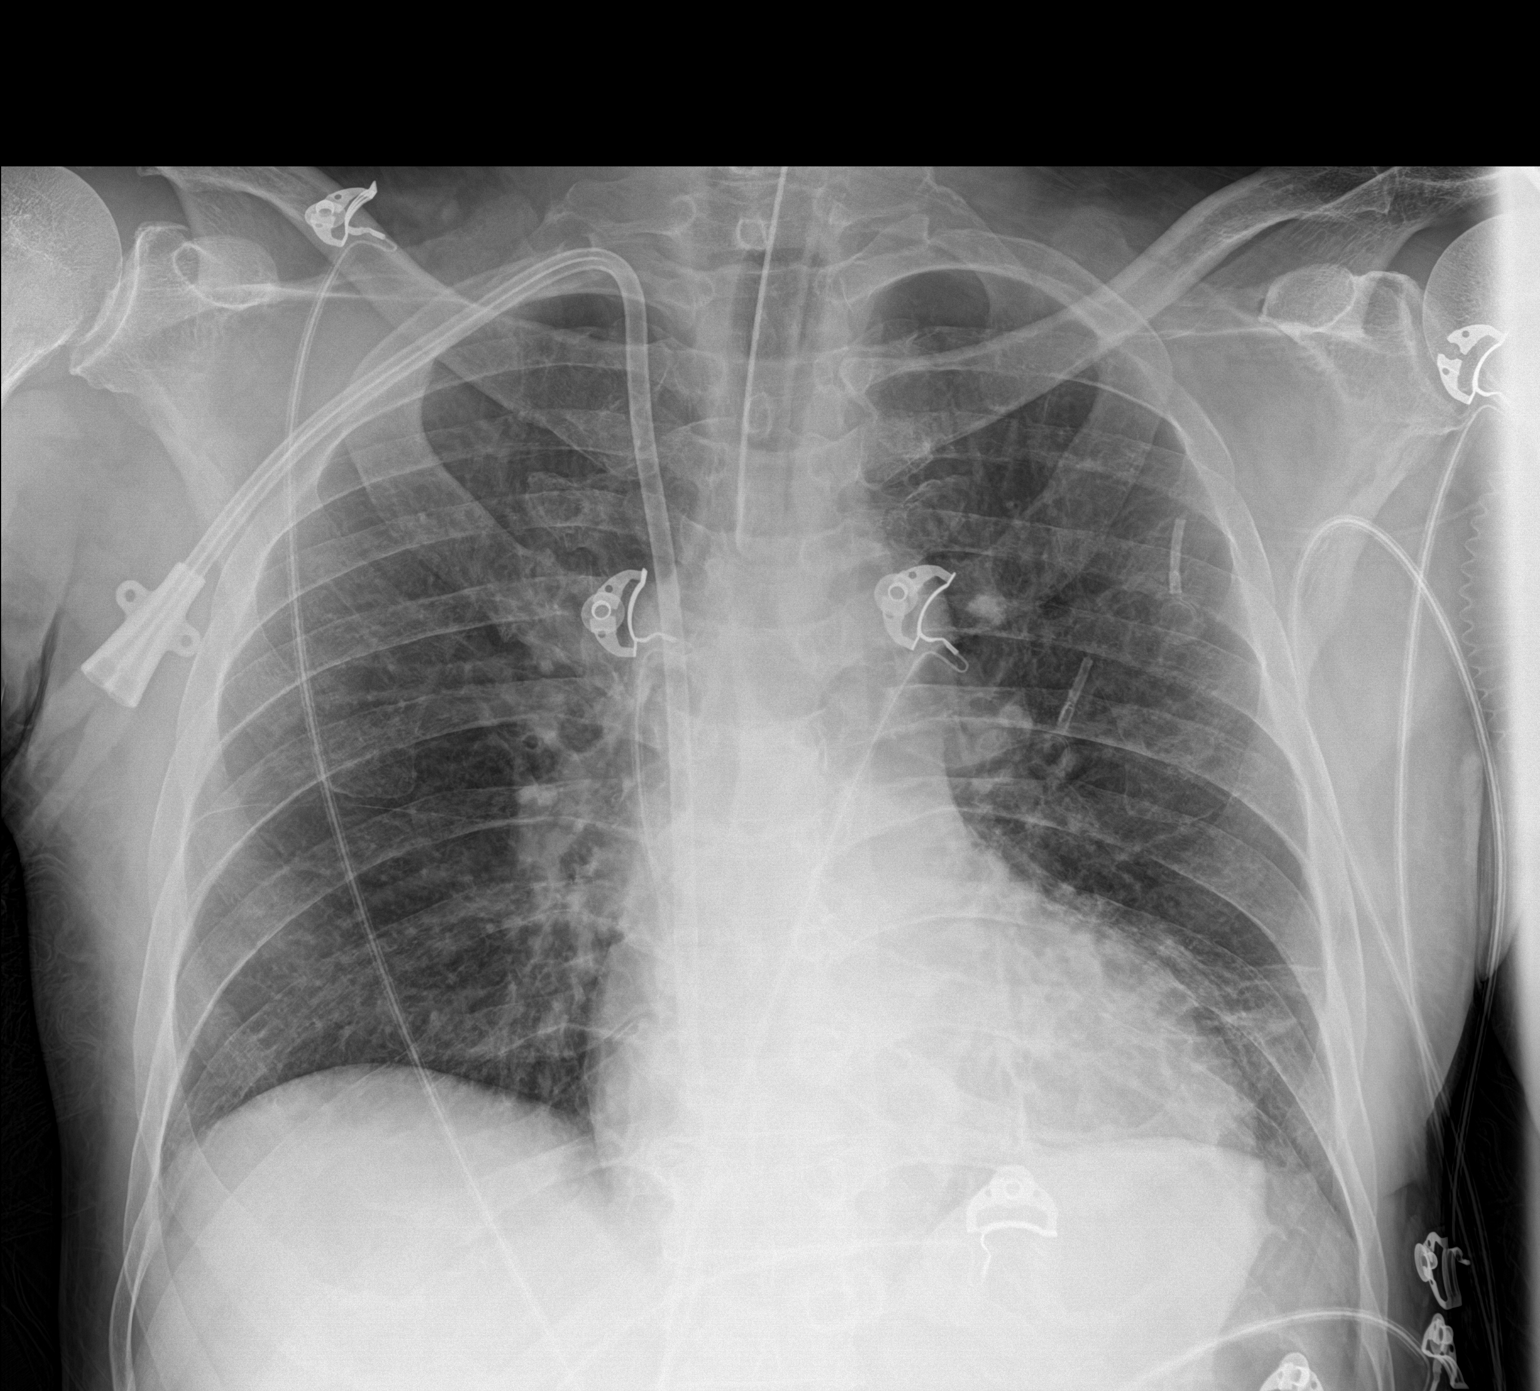

[1 of 1 positions shown; findings below may reference images not displayed]

FINDINGS: Tip of endotracheal tube projects 2.2 cm above carina.

RIGHT jugular central venous catheter tip projects over RIGHT
atrium.

Normal heart size, mediastinal contours, and pulmonary vascularity.

LEFT basilar atelectasis.

Lungs otherwise clear.

No infiltrate, pleural effusion, or pneumothorax.

No acute osseous findings.
IMPRESSION: LEFT basilar atelectasis.

## 2021-05-28 MED ORDER — VITAMIN D 25 MCG (1000 UNIT) PO TABS: 1000.0000 [IU] | ORAL_TABLET | Freq: Every day | ORAL | Status: DC

## 2021-05-28 MED ORDER — PROPOFOL 1000 MG/100ML IV EMUL
5.0000 ug/kg/min | INTRAVENOUS | Status: DC
  Administered 2021-05-28: 10 ug/kg/min via INTRAVENOUS

## 2021-05-28 MED ORDER — FENTANYL CITRATE (PF) 100 MCG/2ML IJ SOLN
25.0000 ug | INTRAMUSCULAR | Status: DC | PRN
  Administered 2021-05-29 – 2021-05-31 (×4): 100 ug via INTRAVENOUS
  Filled 2021-05-28 (×4): qty 2

## 2021-05-28 MED ORDER — FENTANYL CITRATE (PF) 100 MCG/2ML IJ SOLN
INTRAMUSCULAR | Status: AC
  Administered 2021-05-28: 100 ug
  Filled 2021-05-28: qty 2

## 2021-05-28 MED ORDER — HEPARIN SODIUM (PORCINE) 5000 UNIT/ML IJ SOLN
5000.0000 [IU] | Freq: Three times a day (TID) | INTRAMUSCULAR | Status: DC
  Administered 2021-05-28 – 2021-06-08 (×33): 5000 [IU] via SUBCUTANEOUS
  Filled 2021-05-28 (×32): qty 1

## 2021-05-28 MED ORDER — ACETAMINOPHEN 325 MG PO TABS
650.0000 mg | ORAL_TABLET | Freq: Four times a day (QID) | ORAL | Status: DC | PRN
  Administered 2021-06-01 – 2021-06-04 (×6): 650 mg via ORAL
  Filled 2021-05-28 (×6): qty 2

## 2021-05-28 MED ORDER — CHLORHEXIDINE GLUCONATE CLOTH 2 % EX PADS
6.0000 | MEDICATED_PAD | Freq: Every day | CUTANEOUS | Status: DC
  Administered 2021-05-29: 6 via TOPICAL

## 2021-05-28 MED ORDER — DEXTROSE-NACL 5-0.9 % IV SOLN: INTRAVENOUS | Status: DC

## 2021-05-28 MED ORDER — LATANOPROST 0.005 % OP SOLN
1.0000 [drp] | Freq: Every day | OPHTHALMIC | Status: DC
  Administered 2021-05-28 – 2021-06-08 (×12): 1 [drp] via OPHTHALMIC
  Filled 2021-05-28: qty 2.5

## 2021-05-28 MED ORDER — MIDAZOLAM HCL 2 MG/2ML IJ SOLN
1.0000 mg | INTRAMUSCULAR | Status: DC | PRN
Start: 2021-05-28 — End: 2021-05-31

## 2021-05-28 MED ORDER — MUPIROCIN 2 % EX OINT
1.0000 "application " | TOPICAL_OINTMENT | Freq: Two times a day (BID) | CUTANEOUS | Status: AC
  Administered 2021-05-28 – 2021-06-02 (×10): 1 via NASAL
  Filled 2021-05-28 (×3): qty 22

## 2021-05-28 MED ORDER — INSULIN REGULAR(HUMAN) IN NACL 100-0.9 UT/100ML-% IV SOLN
INTRAVENOUS | Status: DC
  Administered 2021-05-28: 6.5 [IU]/h via INTRAVENOUS

## 2021-05-28 MED ORDER — HYDRALAZINE HCL 25 MG PO TABS: 25.0000 mg | ORAL_TABLET | Freq: Three times a day (TID) | ORAL | Status: DC

## 2021-05-28 MED ORDER — ATORVASTATIN CALCIUM 40 MG PO TABS
40.0000 mg | ORAL_TABLET | Freq: Every day | ORAL | Status: DC
  Administered 2021-05-28 – 2021-05-30 (×3): 40 mg
  Filled 2021-05-28 (×4): qty 1

## 2021-05-28 MED ORDER — LORAZEPAM 2 MG/ML IJ SOLN
2.0000 mg | Freq: Once | INTRAMUSCULAR | Status: AC
  Administered 2021-05-28: 2 mg via INTRAVENOUS

## 2021-05-28 MED ORDER — FENTANYL CITRATE PF 50 MCG/ML IJ SOSY: 25.0000 ug | PREFILLED_SYRINGE | INTRAMUSCULAR | Status: DC | PRN

## 2021-05-28 MED ORDER — ATORVASTATIN CALCIUM 40 MG PO TABS: 40.0000 mg | ORAL_TABLET | Freq: Every day | ORAL | Status: DC

## 2021-05-28 MED ORDER — HYDRALAZINE HCL 25 MG PO TABS
25.0000 mg | ORAL_TABLET | Freq: Three times a day (TID) | ORAL | Status: DC
  Administered 2021-05-29 – 2021-05-30 (×4): 25 mg
  Filled 2021-05-28 (×8): qty 1

## 2021-05-28 MED ORDER — PROPOFOL 1000 MG/100ML IV EMUL
0.0000 ug/kg/min | INTRAVENOUS | Status: DC
  Administered 2021-05-28: 21.6 ug/kg/min via INTRAVENOUS
  Administered 2021-05-28: 60 ug/kg/min via INTRAVENOUS
  Administered 2021-05-29 (×3): 50 ug/kg/min via INTRAVENOUS
  Administered 2021-05-29: 30 ug/kg/min via INTRAVENOUS
  Administered 2021-05-30 (×2): 35 ug/kg/min via INTRAVENOUS
  Filled 2021-05-28 (×8): qty 100

## 2021-05-28 MED ORDER — POTASSIUM CHLORIDE 10 MEQ/100ML IV SOLN
10.0000 meq | INTRAVENOUS | Status: DC
Start: 2021-05-28 — End: 2021-05-28
  Administered 2021-05-28: 10 meq via INTRAVENOUS
  Filled 2021-05-28: qty 100

## 2021-05-28 MED ORDER — LISINOPRIL 10 MG PO TABS: 5.0000 mg | ORAL_TABLET | Freq: Every day | ORAL | Status: DC

## 2021-05-28 MED ORDER — VITAMIN D 25 MCG (1000 UNIT) PO TABS
1000.0000 [IU] | ORAL_TABLET | Freq: Every day | ORAL | Status: DC
  Administered 2021-05-29 – 2021-06-01 (×3): 1000 [IU]
  Filled 2021-05-28 (×3): qty 1

## 2021-05-28 MED ORDER — DOCUSATE SODIUM 50 MG/5ML PO LIQD
100.0000 mg | Freq: Two times a day (BID) | ORAL | Status: DC
  Administered 2021-05-28 – 2021-05-30 (×5): 100 mg
  Filled 2021-05-28 (×6): qty 10

## 2021-05-28 MED ORDER — PANTOPRAZOLE SODIUM 40 MG PO PACK
40.0000 mg | PACK | ORAL | Status: DC
  Administered 2021-05-28 – 2021-05-30 (×3): 40 mg
  Filled 2021-05-28 (×5): qty 20

## 2021-05-28 MED ORDER — DILTIAZEM HCL 60 MG PO TABS
60.0000 mg | ORAL_TABLET | Freq: Three times a day (TID) | ORAL | Status: DC
  Administered 2021-05-28 – 2021-05-31 (×7): 60 mg
  Filled 2021-05-28: qty 2
  Filled 2021-05-28 (×2): qty 1
  Filled 2021-05-28: qty 2
  Filled 2021-05-28 (×2): qty 1
  Filled 2021-05-28: qty 2
  Filled 2021-05-28: qty 1
  Filled 2021-05-28: qty 2
  Filled 2021-05-28 (×4): qty 1
  Filled 2021-05-28: qty 2

## 2021-05-28 MED ORDER — ORAL CARE MOUTH RINSE
15.0000 mL | OROMUCOSAL | Status: DC
  Administered 2021-05-28 – 2021-06-01 (×42): 15 mL via OROMUCOSAL

## 2021-05-28 MED ORDER — SODIUM CHLORIDE 0.9 % IV SOLN
20.0000 mg/kg | Freq: Once | INTRAVENOUS | Status: AC
  Administered 2021-05-28: 1440 mg via INTRAVENOUS
  Filled 2021-05-28: qty 28.8

## 2021-05-28 MED ORDER — LORAZEPAM 2 MG/ML IJ SOLN
INTRAMUSCULAR | Status: AC
  Administered 2021-05-28: 2 mg via INTRAVENOUS
  Filled 2021-05-28: qty 1

## 2021-05-28 MED ORDER — LACTATED RINGERS IV SOLN: INTRAVENOUS | Status: DC

## 2021-05-28 MED ORDER — POLYETHYLENE GLYCOL 3350 17 G PO PACK
17.0000 g | PACK | Freq: Every day | ORAL | Status: DC
  Administered 2021-05-29 – 2021-05-30 (×2): 17 g
  Filled 2021-05-28 (×3): qty 1

## 2021-05-28 MED ORDER — ROCURONIUM BROMIDE 50 MG/5ML IV SOLN
70.0000 mg | Freq: Once | INTRAVENOUS | Status: AC
  Administered 2021-05-28: 70 mg via INTRAVENOUS
  Filled 2021-05-28: qty 7

## 2021-05-28 MED ORDER — DEXTROSE 50 % IV SOLN: 0.0000 mL | INTRAVENOUS | Status: DC | PRN

## 2021-05-28 MED ORDER — CHLORHEXIDINE GLUCONATE CLOTH 2 % EX PADS
6.0000 | MEDICATED_PAD | Freq: Every day | CUTANEOUS | Status: DC
  Administered 2021-05-28 – 2021-06-01 (×5): 6 via TOPICAL

## 2021-05-28 MED ORDER — LEVETIRACETAM IN NACL 1000 MG/100ML IV SOLN
1000.0000 mg | Freq: Once | INTRAVENOUS | Status: AC
  Administered 2021-05-28: 1000 mg via INTRAVENOUS

## 2021-05-28 MED ORDER — CHLORHEXIDINE GLUCONATE 0.12% ORAL RINSE (MEDLINE KIT)
15.0000 mL | Freq: Two times a day (BID) | OROMUCOSAL | Status: DC
  Administered 2021-05-28 – 2021-06-01 (×9): 15 mL via OROMUCOSAL

## 2021-05-28 MED ORDER — ASPIRIN 81 MG PO CHEW
81.0000 mg | CHEWABLE_TABLET | Freq: Every day | ORAL | Status: DC
  Administered 2021-05-29 – 2021-06-01 (×3): 81 mg
  Filled 2021-05-28 (×3): qty 1

## 2021-05-28 MED ORDER — LABETALOL HCL 5 MG/ML IV SOLN
10.0000 mg | INTRAVENOUS | Status: DC | PRN
  Administered 2021-05-28 – 2021-06-03 (×6): 10 mg via INTRAVENOUS
  Filled 2021-05-28 (×6): qty 4

## 2021-05-28 MED ORDER — INSULIN REGULAR(HUMAN) IN NACL 100-0.9 UT/100ML-% IV SOLN
INTRAVENOUS | Status: DC
  Filled 2021-05-28: qty 100

## 2021-05-28 MED ORDER — DEXTROSE IN LACTATED RINGERS 5 % IV SOLN: INTRAVENOUS | Status: DC

## 2021-05-28 MED ORDER — LISINOPRIL 10 MG PO TABS
5.0000 mg | ORAL_TABLET | Freq: Every day | ORAL | Status: DC
  Administered 2021-05-29 – 2021-05-30 (×2): 5 mg
  Filled 2021-05-28 (×2): qty 1

## 2021-05-28 MED ORDER — AMLODIPINE BESYLATE 5 MG PO TABS: 10.0000 mg | ORAL_TABLET | Freq: Every day | ORAL | Status: DC

## 2021-05-28 MED ORDER — LORAZEPAM 2 MG/ML IJ SOLN: 2.0000 mg | Freq: Once | INTRAMUSCULAR | Status: DC

## 2021-05-28 MED ORDER — SODIUM CHLORIDE 0.9 % IV SOLN: INTRAVENOUS | Status: DC

## 2021-05-28 NOTE — Code Documentation (Signed)
Patient with seizure like activity on way to CT, see MAR for ativan and keppra administration times.  Patient taken to ED19 to intubate by EDP Long. ED RN Katharine Look at bedside at this time.

## 2021-05-28 NOTE — Procedures (Signed)
Central Venous Catheter Insertion Procedure Note  Jonathan Kane  [id number]  12/09/1945  Date:05/28/21  Time:4:45 PM   Provider Performing:Luciana Cammarata Cleon Dew   Procedure: Insertion of Non-tunneled Central Venous [phone]) with US guidance [id number])   Indication(s) Medication administration and Difficult access  Consent Unable to obtain consent due to inability to find a medical decision maker for patient.  All reasonable efforts were made.  Another independent medical provider, Dr. Halford Chessman , confirmed the benefits of this procedure outweigh the risks.  Anesthesia Topical only with 1% lidocaine   Timeout Verified patient identification, verified procedure, site/side was marked, verified correct patient position, special equipment/implants available, medications/allergies/relevant history reviewed, required imaging and test results available.  Sterile Technique Maximal sterile technique including full sterile barrier drape, hand hygiene, sterile gown, sterile gloves, mask, hair covering, sterile ultrasound probe cover (if used).  Procedure Description Area of catheter insertion was cleaned with chlorhexidine and draped in sterile fashion.  With real-time ultrasound guidance a central venous catheter was placed into the left internal jugular vein. Nonpulsatile blood flow and easy flushing noted in all ports.  The catheter was sutured in place and sterile dressing applied.     Complications/Tolerance None; patient tolerated the procedure well. Chest X-ray is ordered to verify placement for internal jugular or subclavian cannulation.   Chest x-ray is not ordered for femoral cannulation.  EBL Minimal  Specimen(s) None  [employer]., MSN, APRN, AGACNP-BC [facility]  05/28/2021 , 4:46 PM  Please see [email or web address] for pager details  If no response, please call [phone] After hours, please call Elink at [phone]

## 2021-05-28 NOTE — ED Provider Notes (Signed)
Emergency Department Provider Note   I have reviewed the triage vital signs and the nursing notes.   HISTORY  Chief Complaint Altered Mental Status   HPI Jonathan Kane is a 75 y.o. male with past medical history reviewed below presents the emergency department with altered mental status.  Apparently last spoke to someone around 9 AM yesterday.  EMS arrived to find the patient very hyperglycemic and more globally encephalopathic.  They began transport and 10 minutes prior to ED presentation the patient acutely developed right gaze deviation and an apparent weakness on the right.  No known history of seizure.  On arrival I was asked to evaluate the patient and activated a code stroke.   Level 5 caveat: AMS - status epilepticus.    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[place])    Erectile dysfunction    Hypertension    Vitamin D deficiency     Patient Active Problem List   Diagnosis Date Noted   Memory loss 04/13/2021   Cerebrovascular accident (CVA) ([place]) 01/12/2021   DM type 2 with diabetic peripheral neuropathy ([place]) 01/12/2021   Normal anion gap metabolic acidosis [id number]   Secondary hyperparathyroidism of renal origin ([place]) [id number]   Acute metabolic encephalopathy [id number]   Acute on chronic renal failure ([place]) 11/07/2019   CKD (chronic kidney disease) stage 5, GFR less than 15 ml/min (HCC) 09/02/2019   CKD (chronic kidney disease) stage 4, GFR 15-29 ml/min ([place]) 08/01/2019   Paronychia of fifth toe of left foot 05/29/2019   Microalbuminuria due to type 2 diabetes mellitus ([place]) 02/14/2019   Posterior vitreous detachment of both eyes 01/17/2019   Presbyopia of both eyes 01/17/2019   Pseudophakia of both eyes 01/17/2019   Abnormal finding on diagnostic imaging of right testicle 09/14/2018   Epididymitis, left 09/14/2018   Swelling of left testicle 08/23/2018   Primary open angle  glaucoma (POAG) of both eyes, mild stage 01/02/2018   Benign hypertension with chronic kidney disease, stage V ([place]) 11/30/2017   Proteinuria 11/30/2017   Mouth burn, initial encounter 11/10/2017   Vitamin D deficiency, unspecified 11/10/2017   Erectile dysfunction associated with type 2 diabetes mellitus ([place]) 08/29/2017   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 ([place]) 08/16/2017   Hypoglycemia 08/16/2017   PCO (posterior capsular opacification), left 03/03/2017   Hypermetropia of both eyes 03/03/2017   Cocaine dependence in remission ([place]) 07/26/2016   Herpes simplex infection 07/26/2016   CKD stage 4 due to type 2 diabetes mellitus ([place]) 07/07/2016   Noncompliance with diabetes treatment 07/07/2016   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 03/21/2016   Combined arterial insufficiency and corporo-venous occlusive erectile dysfunction 03/04/2016   Allergic rhinitis with postnasal drip 02/24/2016   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   Type 2 diabetes mellitus ([place]) 02/23/2016   Essential hypertension 02/23/2016   Musculoskeletal chest pain 02/23/2016   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[place]) 12/09/2015   Polyneuropathy, unspecified 11/12/2013   Constipation 03/23/2013   Hallucinations 03/23/2013   Polyneuropathy in diabetes ([place]) 03/23/2013   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[place]) 08/30/2011   Diabetes mellitus type II, controlled ([place]) 08/30/2011   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    Past Surgical History:  Procedure Laterality Date   COLONOSCOPY     IR DIALY SHUNT INTRO NEEDLE/INTRACATH INITIAL W/IMG LEFT Left 06/25/2020   keloid removed  Allergies Dulaglutide  Family History  Problem Relation Age of Onset   Ovarian cancer Mother    Other Father        unsure of medical history    Social History Social History   Tobacco Use   Smoking status: Former   Smokeless  tobacco: Never  Scientific laboratory technician Use: Never used  Substance Use Topics   Alcohol use: Yes    Comment: very litte - occasional beer   Drug use: No    Review of Systems  Level 5 caveat: AMS - unresponsive.   ____________________________________________   PHYSICAL EXAM:  VITAL SIGNS: Vitals:   05/28/21 1256 05/28/21 1313  BP: (!) 186/104 (!) 202/109  Pulse: (!) 109 (!) 112  Resp: 16 16  Temp:  99.7 F (37.6 C)  SpO2: 100% 100%   Constitutional: Right gaze deviation. Moving the LUE and LLE with not much spontaneous movement on the right.  Eyes: Conjunctivae are normal. Right gaze deviation.  Head: Atraumatic. Nose: No congestion/rhinnorhea. Mouth/Throat: Mucous membranes are moist.  Neck: No stridor.  Cardiovascular: Tachycardia. Good peripheral circulation. Grossly normal heart sounds.   Respiratory: Normal respiratory effort.  No retractions. Lungs CTAB. Gastrointestinal: Soft and nontender. No distention.  Musculoskeletal: No gross deformities of extremities. Neurologic: Unable to assess speech. No movement to pain in the RUE. Some movement in the RLE. Flailing left arm and left leg at times with some repetitive lip smacking movements on arrival.  Skin:  Skin is warm, dry and intact. No rash noted.  ____________________________________________   LABS (all labs ordered are listed, but only abnormal results are displayed)  Labs Reviewed  CBC - Abnormal; Notable for the following components:      Result Value   RBC 3.84 (*)    Hemoglobin 11.9 (*)    HCT 35.2 (*)    Platelets 146 (*)    All other components within normal limits  DIFFERENTIAL - Abnormal; Notable for the following components:   Lymphs Abs 0.4 (*)    All other components within normal limits  COMPREHENSIVE METABOLIC PANEL - Abnormal; Notable for the following components:   Sodium 129 (*)    Chloride 92 (*)    CO2 21 (*)    Glucose, Bld 777 (*)    BUN 58 (*)    Creatinine, Ser 9.02 (*)     Calcium 8.1 (*)    GFR, Estimated 6 (*)    Anion gap 16 (*)    All other components within normal limits  I-STAT CHEM 8, ED - Abnormal; Notable for the following components:   Sodium 130 (*)    Chloride 96 (*)    BUN 72 (*)    Creatinine, Ser 9.60 (*)    Glucose, Bld >700 (*)    Calcium, Ion 0.99 (*)    Hemoglobin 12.6 (*)    HCT 37.0 (*)    All other components within normal limits  CBG MONITORING, ED - Abnormal; Notable for the following components:   Glucose-Capillary >600 (*)    All other components within normal limits  RESP PANEL BY RT-PCR (FLU A&B, COVID) ARPGX2  ETHANOL  PROTIME-INR  APTT  RAPID URINE DRUG SCREEN, HOSP PERFORMED  URINALYSIS, ROUTINE W REFLEX MICROSCOPIC  BETA-HYDROXYBUTYRIC ACID  BETA-HYDROXYBUTYRIC ACID  I-STAT VENOUS BLOOD GAS, ED   ____________________________________________  EKG   EKG Interpretation  Date/Time:  Friday May 28 2021 12:25:52 EDT Ventricular Rate:  121 PR Interval:  158 QRS Duration: 82 QT Interval:  340 QTC Calculation: 483 R Axis:   72 Text Interpretation: Sinus tachycardia Atrial premature complex LAE, consider biatrial enlargement Anterior infarct, old Confirmed by Nanda Quinton [phone]) on 05/28/2021 12:29:52 PM        ____________________________________________  RADIOLOGY  CT HEAD CODE STROKE WO CONTRAST  Result Date: 05/28/2021 CLINICAL DATA:  Code stroke.  Neuro deficit, acute, stroke suspected EXAM: CT HEAD WITHOUT CONTRAST TECHNIQUE: Contiguous axial images were obtained from the base of the skull through the vertex without intravenous contrast. COMPARISON:  05/22/2021 FINDINGS: There is significant motion artifact. Brain: No acute intracranial hemorrhage or mass effect. No definite new loss of gray-white differentiation. Areas of encephalomalacia are again identified in bilateral anterior inferior frontal and left temporal lobes. Additional patchy and confluent areas hypoattenuation in the supratentorial white  matter probably reflects stable chronic microvascular ischemic changes. Prominence of the ventricles and sulci reflects stable parenchymal volume loss. No extra-axial collection. Vascular: Hyperdense vessel. Skull: Unremarkable. Sinuses/Orbits: No acute abnormality. Other: None ASPECTS ([place] Stroke Program Early CT Score) - Ganglionic level infarction (caudate, lentiform nuclei, internal capsule, insula, M1-M3 cortex): 7 - Supraganglionic infarction (M4-M6 cortex): 3 Total score (0-10 with 10 being normal): 10 IMPRESSION: Significantly motion degraded. There is no acute intracranial hemorrhage or definite evidence of acute infarction. ASPECT score is 10. These results were communicated to Dr. Rory Percy at 12:16 pm on 05/28/2021 by text page via the [facility] messaging system. Electronically Signed   By: Macy Mis M.D.   On: 05/28/2021 12:19    ____________________________________________   PROCEDURES  Procedure(s) performed:   Procedure Name: Intubation Date/Time: 05/28/2021 12:31 PM Performed by: Margette Fast, MD Pre-anesthesia Checklist: Patient identified, Patient being monitored, Emergency Drugs available and Suction available Oxygen Delivery Method: Non-rebreather mask Preoxygenation: Pre-oxygenation with 100% oxygen Induction Type: Rapid sequence Ventilation: Mask ventilation without difficulty Laryngoscope Size: Glidescope and 3 Grade View: Grade II Tube size: 7.5 mm Number of attempts: 1 Airway Equipment and Method: Video-laryngoscopy Placement Confirmation: ETT inserted through vocal cords under direct vision, CO2 detector and Breath sounds checked- equal and bilateral Secured at: 25 cm Tube secured with: ETT holder Dental Injury: Teeth and Oropharynx as per pre-operative assessment  Comments: Dentures removed prior to intubation. No immediate complications.     .Critical Care  Date/Time: 05/28/2021 1:19 PM Performed by: Margette Fast, MD Authorized by: Margette Fast, MD    Critical care provider statement:    Critical care time (minutes):  45   Critical care time was exclusive of:  Separately billable procedures and treating other patients and teaching time   Critical care was necessary to treat or prevent imminent or life-threatening deterioration of the following conditions:  CNS failure or compromise   Critical care was time spent personally by me on the following activities:  Discussions with consultants, evaluation of patient's response to treatment, examination of patient, ordering and performing treatments and interventions, ordering and review of laboratory studies, ordering and review of radiographic studies, pulse oximetry, re-evaluation of patient's condition, obtaining history from patient or surrogate, review of old charts, blood draw for specimens, development of treatment plan with patient or surrogate and ventilator management   I assumed direction of critical care for this patient from another provider in my specialty: no     Care discussed with: admitting provider     ____________________________________________   INITIAL IMPRESSION / [place] / ED COURSE  Pertinent labs & imaging results that were available during my care of the patient were  reviewed by me and considered in my medical decision making (see chart for details).   I evaluated the patient at the bridge for consideration of possible code stroke.  Last normal 9 AM yesterday but patient acutely changed in route with EMS 10 minutes prior to arrival.  Some possible seizure activity noted but not completely clear.  Patient does have a right gaze deviation which per EMS is definitely new in route.  Activated a code stroke and patient taken emergently to CT.  Gave 2 mg of Ativan.   12:30 PM  Patient intubated for airway protection.  Continues with seizure activity. Plan for urgent EEG. AED meds per Neurology. Patient received an additional 2 mg of Ativan. Starting Propofol. I  placed an EJ on the right due to poor vascular access w/o [place] assistance.   Labs resulting with hyperglycemia. Slight anion gap noted. Will start insulin infusion and admit to ICU. EEG being set up in the room.   Discussed patient's case with ICU to request admission. Patient and family (if present) updated with plan. Care transferred to ICU service.  I reviewed all nursing notes, vitals, pertinent old records, EKGs, labs, imaging (as available).  ____________________________________________  FINAL CLINICAL IMPRESSION(S) / ED DIAGNOSES  Final diagnoses:  Status epilepticus ([place])  Diabetic ketoacidosis without coma associated with other specified diabetes mellitus ([place])     MEDICATIONS GIVEN DURING THIS VISIT:  Medications  propofol (DIPRIVAN) 1000 MG/100ML infusion (10 mcg/kg/min  72 kg Intravenous New Bag/Given 05/28/21 1230)  LORazepam (ATIVAN) injection 2 mg (has no administration in time range)  insulin regular, human (MYXREDLIN) 100 units/ 100 mL infusion (has no administration in time range)  lactated ringers infusion (has no administration in time range)  dextrose 5 % in lactated ringers infusion (has no administration in time range)  dextrose 50 % solution 0-50 mL (has no administration in time range)  potassium chloride 10 mEq in 100 mL IVPB (has no administration in time range)  levETIRAcetam (KEPPRA) IVPB 1000 mg/100 mL premix (0 mg Intravenous Stopped 05/28/21 1205)  LORazepam (ATIVAN) injection 2 mg (2 mg Intravenous Given 05/28/21 1217)  fosPHENYtoin (CEREBYX) 1,440 mg PE in sodium chloride 0.9 % 50 mL IVPB (0 mg PE/kg  72 kg Intravenous Stopped 05/28/21 1247)  rocuronium (ZEMURON) injection 70 mg (70 mg Intravenous Given 05/28/21 1223)     Note:  This document was prepared using Dragon voice recognition software and may include unintentional dictation errors.  Nanda Quinton, MD, [facility]    Andra Matsuo, [facility], MD 05/29/21 [phone]

## 2021-05-28 NOTE — ED Triage Notes (Signed)
Neighbor spoke with patient on phone yesterday 0900, found on bathroom floor today at 1100. Medic arrival patient found unable to speak but able to follow directions, CBG >600, stood and transferred to stretcher. In route to ED patient develop\ed left lean and right gaze.

## 2021-05-28 NOTE — Progress Notes (Signed)
Pt hemodialysis treatment move to tomorrow per Dr. Jonnie Finner ordered. [place]. Caryl Pina RN notified.

## 2021-05-28 NOTE — ED Notes (Addendum)
Intubated 26 lip, 7.5cm tube Dr Laverta [place], placed on vent per RT settings.

## 2021-05-28 NOTE — Code Documentation (Signed)
Stroke Response Nurse Documentation Code Documentation  PRIMUS MCNAMARA is a 75 y.o. male arriving to [place] ED via [facility] on 05/28/21 with past medical hx of diabetes, HTN, ESRD. Code stroke was activated by EMS. Patient from home where he was LKW yesterday 05/27/21 at 0900 when neighbors talked to him on the phone.  Today neighbors stopped by to check on him when he was found with AMS and unable to speak. En route with EMS patient developed a severe left lean and right gaze, right arm flaccid.   Stroke team at the bedside on patient arrival. Labs drawn and patient cleared for CT by EDP. Patient to CT with team. NIHSS 28, see documentation for details and code stroke times. Patient not speaking or following commands, right gaze, weakness right greater than left, and global aphasia on exam. The following imaging was completed:  CT. Patient is not a candidate for tPA due to outside the window.  Care/Plan: q2h vitals and NIHSS. Bedside handoff with ED RN Katharine Look.    Candace Cruise K  Stroke Response RN

## 2021-05-28 NOTE — ED Notes (Addendum)
attending at bedside, notified and aware of BP >200/100. Patient remains sedated on propofol gtt.

## 2021-05-28 NOTE — Progress Notes (Signed)
Patient transported on vent to [id number] with no complications.

## 2021-05-28 NOTE — ED Notes (Signed)
EEG in process

## 2021-05-28 NOTE — Progress Notes (Signed)
LTM maint complete - No seen skin breakdown [facility] monitored, Event button test confirmed by [facility].

## 2021-05-28 NOTE — H&P (Signed)
NAME:  Jonathan Kane, MRN:  [id number], DOB:  [birth year], LOS: 0 ADMISSION DATE:  05/28/2021, CONSULTATION DATE: 05/28/2021 REFERRING MD:  Dr. Laverta Baltimore, CHIEF COMPLAINT: Altered mental status  History of Present Illness:  75 yo male former smoker last seen normal on 05/27/21 at 9 am was found by neighbor on floor at 11 am on 05/28/21.  Found to have CBG > 600.  Was able to communicate with EMS and assist with getting on gurney.  En route to ED he developed Rt gaze preference and Lt side weakness.  Code stroke called.  He developed seizure and required intubation for airway protection.  Had SBP 229/127 in ER.    Pertinent  Medical History  DM type 2, ESRD on HD, ED, HTN, Vit D deficiency, Cocaine abuse, Hep C, HSV, DM retinopathy, BPH, ETOH, Secondary hyperparathyroidism  Significant Hospital Events: Including procedures, antibiotic start and stop dates in addition to other pertinent events   8/12 Admit, neurology and nephrology consulted, started on insulin gtt, EEG monitoring  Interim History / Subjective:    Objective   Blood pressure (!) 205/110, pulse (!) 113, temperature 99.7 F (37.6 C), temperature source Axillary, resp. rate 16, height '5\' 10"'$  (1.778 m), weight 72 kg, SpO2 100 %.    Vent Mode: PRVC FiO2 (%):  [40 %-100 %] 40 % Set Rate:  [16 bmp] 16 bmp Vt Set:  [580 mL] 580 mL PEEP:  [5 cmH20] 5 cmH20 Plateau Pressure:  [17 cmH20] 17 cmH20   Intake/Output Summary (Last 24 hours) at 05/28/2021 1339 Last data filed at 05/28/2021 1247 Gross per 24 hour  Intake 78.8 ml  Output --  Net 78.8 ml   Filed Weights   05/28/21 1200  Weight: 72 kg    Examination:  General - sedated Eyes - pupils reactive ENT - ETT in place Cardiac - regular, tachycardic Chest - b/l crackles Abdomen - soft, non tender, decreased bowel sounds Extremities - av fistula Lt arm Skin - no rashes Neuro - RASS -3  CT head - B/l anterior inferior frontal and Lt temporal lobes encephalomalacia.  Additional patchy and confluent areas hypoattenuation in the supratentorial white matter probably reflects stable chronic microvascular ischemic changes   Resolved Hospital Problem list     Assessment & Plan:   Acute metabolic encephalopathy with hyperglycemia, hypertensive emergency, and new onset seizure with status epilepticus. - neurology consulted - f/u EEG - defer AEDs to neurology - RASS goal -1  Compromised airway. - f/u CXR  DM type 2 poorly controlled with hyperglycemia. - insulin gtt  ESRD on HD. - nephrology consulted  Hypertensive emergency. Hx of HLD. - goal SBP < 185, DBP < 105 - prn IV labetalol - continue aspirin, lipitor, diltiazem, lisinopril, hydralazine  Best Practice (right click and "Reselect all SmartList Selections" daily)   Diet/type: NPO w/ meds via tube DVT prophylaxis: prophylactic heparin  GI prophylaxis: PPI Lines: Central line Foley:  N/A Code Status:  full code Last date of multidisciplinary goals of care discussion '[x]'$   Labs   CBC: Recent Labs  Lab 05/28/21 1157 05/28/21 1206 05/28/21 1318  WBC 8.0  --   --   NEUTROABS 7.1  --   --   HGB 11.9* 12.6* 11.9*  HCT 35.2* 37.0* 35.0*  MCV 91.7  --   --   PLT 146*  --   --     Basic Metabolic Panel: Recent Labs  Lab 05/28/21 1157 05/28/21 1206 05/28/21 1318  NA 129* 130*  130*  K 4.7 4.8 3.9  CL 92* 96*  --   CO2 21*  --   --   GLUCOSE 777* >700*  --   BUN 58* 72*  --   CREATININE 9.02* 9.60*  --   CALCIUM 8.1*  --   --    GFR: Estimated Creatinine Clearance: 7 mL/min (A) (by C-G formula based on SCr of 9.6 mg/dL (H)). Recent Labs  Lab 05/28/21 1157  WBC 8.0    Liver Function Tests: Recent Labs  Lab 05/28/21 1157  AST 36  ALT 20  ALKPHOS 69  BILITOT 0.8  PROT 7.4  ALBUMIN 4.1   No results for input(s): LIPASE, AMYLASE in the last 168 hours. No results for input(s): AMMONIA in the last 168 hours.  ABG    Component Value Date/Time   PHART 7.394  05/28/2021 1318   PCO2ART 38.7 05/28/2021 1318   PO2ART 428 (H) 05/28/2021 1318   HCO3 23.5 05/28/2021 1318   TCO2 25 05/28/2021 1318   ACIDBASEDEF 1.0 05/28/2021 1318   O2SAT 100.0 05/28/2021 1318     Coagulation Profile: Recent Labs  Lab 05/28/21 1157  INR 1.1    Cardiac Enzymes: No results for input(s): CKTOTAL, CKMB, CKMBINDEX, TROPONINI in the last 168 hours.  HbA1C: Hgb A1c MFr Bld  Date/Time Value Ref Range Status  11/07/2019 07:17 AM 10.1 (H) 4.8 - 5.6 % Final    Comment:    (NOTE) Pre diabetes:          5.7%-6.4% Diabetes:              >6.4% Glycemic control for   <7.0% adults with diabetes     CBG: Recent Labs  Lab 05/28/21 1159  GLUCAP >600*    Review of Systems:   Unable to obtain.  Past Medical History:  He,  has a past medical history of Diabetes mellitus without complication ([place]), End stage kidney disease ([place]), Erectile dysfunction, Hypertension, and Vitamin D deficiency.   Surgical History:   Past Surgical History:  Procedure Laterality Date   COLONOSCOPY     IR DIALY SHUNT INTRO NEEDLE/INTRACATH INITIAL W/IMG LEFT Left 06/25/2020   keloid removed       Social History:   reports that he has quit smoking. He has never used smokeless tobacco. He reports current alcohol use. He reports that he does not use drugs.   Family History:  His family history includes Other in his father; Ovarian cancer in his mother.   Allergies Allergies  Allergen Reactions   Dulaglutide     TRULICITY:  GI Upset intolerance     Home Medications  Prior to Admission medications   Medication Sig Start Date End Date Taking? Authorizing Provider  ACCU-CHEK AVIVA PLUS test strip 1 each by Other route as needed.  04/06/17   [provider]  ACCU-CHEK SOFTCLIX LANCETS lancets CHECK BLOOD SUGAR TWO TO THREE TIMES DAILY 04/05/17   [provider]  Alcohol Swabs (B-D SINGLE USE SWABS REGULAR) PADS USE TO CHECK BLOOD SUGAR AS DIRECTED 01/26/17    [provider]  amLODipine (NORVASC) 10 MG tablet Take 10 mg by mouth daily. 05/20/20   [provider]  aspirin EC 81 MG tablet Take 81 mg by mouth daily.    [provider]  atorvastatin (LIPITOR) 40 MG tablet Take 40 mg by mouth at bedtime. 05/20/20   [provider]  Blood Glucose Monitoring Suppl (GLUCOCOM BLOOD GLUCOSE MONITOR) DEVI Use as directed.  Dx  Code: E11.621 10/26/16   [provider]  calcitRIOL (ROCALTROL) 0.25 MCG capsule Take 0.25 mcg by mouth daily.    [provider]  cholecalciferol (VITAMIN D3) 25 MCG (1000 UNIT) tablet Take 1,000 Units by mouth daily.    [provider]  cyclobenzaprine (FLEXERIL) 10 MG tablet Take 10 mg by mouth 2 (two) times daily as needed for muscle spasms. 07/01/20   [provider]  diclofenac Sodium (VOLTAREN) 1 % GEL Apply 2 g topically 4 (four) times daily. 08/04/20   [provider]  diltiazem (TIAZAC) 180 MG 24 hr capsule Take 180 mg by mouth daily. 08/04/20   [provider]  glipiZIDE (GLUCOTROL XL) 2.5 MG 24 hr tablet Take 2.5 mg by mouth daily. 11/11/20   [provider]  hydrALAZINE (APRESOLINE) 25 MG tablet Take 25 mg by mouth 3 (three) times daily.    [provider]  insulin aspart (NOVOLOG) 100 UNIT/ML FlexPen Inject 0-7 Units into the skin 3 (three) times daily before meals. Sliding scale 07/03/19   [provider]  Insulin Pen Needle 31G X 8 MM MISC Use as directed per sliding scale.  Dx Code: E11.621 02/03/16   [provider]  latanoprost (XALATAN) 0.005 % ophthalmic solution Place 1 drop into both eyes at bedtime. 05/24/19   [provider]  lidocaine-prilocaine (EMLA) cream Apply 1 application topically daily as needed (pain). 06/08/20   [provider]  lisinopril (ZESTRIL) 5 MG tablet Take 5 mg by mouth daily. 05/20/20   [provider]  Naftifine HCl 2 % GEL Apply between toes twice  daily Patient taking differently: Apply 1 application topically daily. Apply between toes 09/08/20   McDonald, Stephan Minister, DPM  sevelamer carbonate (RENVELA) 800 MG tablet Take 2,400 mg by mouth 3 (three) times daily with meals. 07/17/20   [provider]  sildenafil (REVATIO) 20 MG tablet Take 40-60 mg by mouth daily as needed (ED).  10/10/19   [provider]  sodium bicarbonate 650 MG tablet Take 1 tablet (650 mg total) by mouth 2 (two) times daily. Patient taking differently: Take 650 mg by mouth daily. 11/08/19   Santos-Sanchez, Merlene Morse, MD  TRADJENTA 5 MG TABS tablet Take 5 mg by mouth daily. 06/16/20   [provider]  TRESIBA FLEXTOUCH 100 UNIT/ML SOPN FlexTouch Pen Inject 25 Units into the skin daily. 11/03/19   [provider]     Critical care time: 45 minutes  Chesley Mires, MD [place] Pager - [phone] 05/28/2021, 2:15 PM

## 2021-05-28 NOTE — Procedures (Signed)
Patient Name: Jonathan Kane  MRN: [id number]  Epilepsy Attending: Lora Havens  Referring Physician/Provider: Anibal Henderson, NP Date: 05/28/2021 Duration: 22.59 mins  Patient history:  75 y.o. male who was found down in the bathroom at his dialysis clinic, confused who had new onset status epilepticus in the setting of severe hyperglycemia en route to the ED. EEG to evaluate for seizure  Level of alertness: Comatose  AEDs during EEG study: LEV, PHT, propofol, ativan  Technical aspects: This EEG study was done with scalp electrodes positioned according to the 10-20 International system of electrode placement. Electrical activity was acquired at a sampling rate of '500Hz'$  and reviewed with a high frequency filter of '70Hz'$  and a low frequency filter of '1Hz'$ . EEG data were recorded continuously and digitally stored.   Description: EEG showed continuous generalized and lateralized right hemisphere 3 to 6 Hz theta-delta slowing admixed with 15 to 18 Hz beta activity.  There is also sharply contoured 2 to 3 Hz delta slowing in bilateral, left > right frontal region.  Physiologic photic driving was not seen during photic stimulation.  Hyperventilation was not performed.     ABNORMALITY -Continuous slow, generalized and lateralized right hemisphere -Continuous rhythmic, left > right frontal region  IMPRESSION: This study showed sharply contoured rhythmic slowing in bilateral left m>right frontal region.  This EEG pattern is on the ictal-interictal continuum with low potential for seizures. There is also cortical dysfunction in right hemisphere likely secondary underlying structural abnormality, postictal state.  There is also moderate to severe diffuse encephalopathy, nonspecific etiology but likely related to sedation.  No seizures or definite epileptiform discharges were seen throughout the recording.  Euphemia Lingerfelt [name]

## 2021-05-28 NOTE — Progress Notes (Signed)
EEG complete - results pending 

## 2021-05-28 NOTE — Progress Notes (Signed)
LTM EEG hooked up and running - no initial skin breakdown - push button tested - neuro notified.  

## 2021-05-28 NOTE — Consult Note (Signed)
Renal Service Consult Note [facility]  Jonathan Kane 05/28/2021 Sol Blazing, MD Requesting Physician: Dr Halford Chessman  Reason for Consult: ESRD pt w/ seizures and uncont diabetes  HPI: The patient is a 75 y.o. year-old w/ hx of DM2, ESRD on HD and HTN found on the floor by a neighbor. EMS called and CBG was > 600. En route pt had a seizure and stroke-like symptoms. Was intubated. SBP 229/ 127 in ED.  In ED insulin gtt was started. Asked to see for ESRD.    Pt seen in ED, he is on vent on ED stretcher, not responsive.     ROS -n/a   Past Medical History  Past Medical History:  Diagnosis Date   Diabetes mellitus without complication ([place])    End stage kidney disease ([place])    Erectile dysfunction    Hypertension    Vitamin D deficiency    Past Surgical History  Past Surgical History:  Procedure Laterality Date   COLONOSCOPY     IR DIALY SHUNT INTRO NEEDLE/INTRACATH INITIAL W/IMG LEFT Left 06/25/2020   keloid removed     Family History  Family History  Problem Relation Age of Onset   Ovarian cancer Mother    Other Father        unsure of medical history   Social History  reports that he has quit smoking. He has never used smokeless tobacco. He reports current alcohol use. He reports that he does not use drugs. Allergies  Allergies  Allergen Reactions   Dulaglutide     TRULICITY:  GI Upset intolerance   Home medications Prior to Admission medications   Medication Sig Start Date End Date Taking? Authorizing Provider  ACCU-CHEK AVIVA PLUS test strip 1 each by Other route as needed.  04/06/17   [provider]  ACCU-CHEK SOFTCLIX LANCETS lancets CHECK BLOOD SUGAR TWO TO THREE TIMES DAILY 04/05/17   [provider]  Alcohol Swabs (B-D SINGLE USE SWABS REGULAR) PADS USE TO CHECK BLOOD SUGAR AS DIRECTED 01/26/17   [provider]  amLODipine (NORVASC) 10 MG tablet Take 10 mg by mouth daily. 05/20/20   [provider]  aspirin EC  81 MG tablet Take 81 mg by mouth daily.    [provider]  atorvastatin (LIPITOR) 40 MG tablet Take 40 mg by mouth at bedtime. 05/20/20   [provider]  Blood Glucose Monitoring Suppl (GLUCOCOM BLOOD GLUCOSE MONITOR) DEVI Use as directed.  Dx Code: E11.621 10/26/16   [provider]  calcitRIOL (ROCALTROL) 0.25 MCG capsule Take 0.25 mcg by mouth daily.    [provider]  cholecalciferol (VITAMIN D3) 25 MCG (1000 UNIT) tablet Take 1,000 Units by mouth daily.    [provider]  cyclobenzaprine (FLEXERIL) 10 MG tablet Take 10 mg by mouth 2 (two) times daily as needed for muscle spasms. 07/01/20   [provider]  diclofenac Sodium (VOLTAREN) 1 % GEL Apply 2 g topically 4 (four) times daily. 08/04/20   [provider]  diltiazem (TIAZAC) 180 MG 24 hr capsule Take 180 mg by mouth daily. 08/04/20   [provider]  glipiZIDE (GLUCOTROL XL) 2.5 MG 24 hr tablet Take 2.5 mg by mouth daily. 11/11/20   [provider]  glipiZIDE (GLUCOTROL) 10 MG tablet Take 10 mg by mouth daily. 05/21/21   [provider]  hydrALAZINE (APRESOLINE) 25 MG tablet Take 25 mg by mouth 3 (three) times daily.    [provider]  insulin aspart (NOVOLOG) 100 UNIT/ML FlexPen Inject 0-7 Units into the skin 3 (three) times daily before meals. Sliding scale 07/03/19   [provider]  Insulin Pen Needle 31G X 8 MM MISC Use as directed per sliding scale.  Dx Code: E11.621 02/03/16   [provider]  latanoprost (XALATAN) 0.005 % ophthalmic solution Place 1 drop into both eyes at bedtime. 05/24/19   [provider]  lidocaine-prilocaine (EMLA) cream Apply 1 application topically daily as needed (pain). 06/08/20   [provider]  lisinopril (ZESTRIL) 5 MG tablet Take 5 mg by mouth daily. 05/20/20   [provider]  Naftifine HCl 2 % GEL Apply between toes twice daily Patient taking differently: Apply 1  application topically daily. Apply between toes 09/08/20   McDonald, Stephan Minister, DPM  sevelamer carbonate (RENVELA) 800 MG tablet Take 2,400 mg by mouth 3 (three) times daily with meals. 07/17/20   [provider]  sildenafil (REVATIO) 20 MG tablet Take 40-60 mg by mouth daily as needed (ED).  10/10/19   [provider]  sodium bicarbonate 650 MG tablet Take 1 tablet (650 mg total) by mouth 2 (two) times daily. Patient taking differently: Take 650 mg by mouth daily. 11/08/19   Santos-Sanchez, Merlene Morse, MD  TRADJENTA 5 MG TABS tablet Take 5 mg by mouth daily. 06/16/20   [provider]  TRESIBA FLEXTOUCH 100 UNIT/ML SOPN FlexTouch Pen Inject 25 Units into the skin daily. 11/03/19   [provider]     Vitals:   05/28/21 1545 05/28/21 1600 05/28/21 1630 05/28/21 1632  BP: (!) 148/81 (!) 150/85 (!) 150/86   Pulse: 91 93 90 89  Resp: '16 16 16 16  '$ Temp:      TempSrc:      SpO2: 100% 100% 100% 100%  Weight:      Height:       Exam Gen on vent, sedated No rash, cyanosis or gangrene Sclera anicteric, throat w/ ETT No jvd or bruits Chest clear anterior/ lateral RRR no MRG Abd soft ntnd no mass or ascites +bs GU normal male MS no joint effusions or deformity Ext no LE or UE edema, no wounds or ulcers Neuro is on vent, sedated, arms mild twitching off and on   LFA AVF+bruit/  R IJ TDC     Home meds include norvasc, asa, lipitor, diltiazem, glipizide, hydralazine, insulin aspart, lisinopril, renvela 3 ac tid, sod bicarb, tradjenta , tresiba insulin, prns     OP HD: [place] MWF      400/ 1.5  70.5kg   2/2.5    Assessment/ Plan: AMS - w/ uncont HTN, HTN emergency and new seizures.  Getting IV insulin and BP meds. Neuro consulting.  ESRD - on HD MWF. BUN 72, creat 9.6.  Plan HD tonight or tomorrow am.  VDRF - per CCM DM2/ hyperglycemia - insulin gtt started. Does not need IVF"s since he is ESRD and does not get dehydrated from uncont DM.   If IV blood sugar  support is needed, recommend D5W or D10 at modest rate 40 - 80 cc/hr.  HTN urgency - prn IV meds, cont home meds per NG Volume - doesn't look vol overloaded, goal 2L w/ HD       Rob Ardenia Stiner  MD 05/28/2021, 4:58 PM  Recent Labs  Lab 05/28/21 1157 05/28/21 1206 05/28/21 1318 05/28/21 1346  WBC 8.0  --   --   --   HGB 11.9*   < > 11.9*  12.9*   < > = values in this interval not displayed.   Recent Labs  Lab 05/28/21 1157 05/28/21 1206 05/28/21 1318 05/28/21 1346  K 4.7 4.8 3.9 4.1  BUN 58* 72*  --   --   CREATININE 9.02* 9.60*  --   --   CALCIUM 8.1*  --   --   --

## 2021-05-28 NOTE — Consult Note (Signed)
Neurology Consultation  Reason for Consult: Altered mental status, seizure Referring Physician: Dr. Laverta Baltimore  CC: AMS with witnessed GTC seizure on arrival  History is obtained from: EMS, Chart review  HPI: Jonathan Kane is a 75 y.o. male with a medical history significant for type 2 diabetes mellitus, ESRD on hemodialysis, essential hypertension, and vitamin deficiency who was at his dialysis clinic today when he was found down in the bathroom and was noted to be confused. On EMS arrival, Jonathan Kane was non-verbal but was able to follow some simple commands. EMS obtained a blood glucose that resulted as "high" on the monitor and he was transported to the ED. While en route, Jonathan Kane became unresponsive with a forced right gaze, rightward head turn, and generalized myoclonic twitching with a flaccid right upper extremity with intermittent left arm raising. On arrival, he was given 2 mg of IV Ativan on the way to the CT scanner and 1,000 mg of Keppra once in CT without resolution of seizure activity. He was given another 2 mg of Ativan on the way back to his room in the ED and was intubated for airway protection.   LKW: 09:00 family spoke to patient on the phone  tpa given?: no, patient presents with seizure activity as etiology for AMS IR Thrombectomy? No, presentation is consistent with seizure activity as etiology for AMS Modified Rankin Scale: 0-Completely asymptomatic and back to baseline post- stroke  ROS: Unable to obtain due to altered mental status.   Past Medical History:  Diagnosis Date   Diabetes mellitus without complication ([place])    End stage kidney disease ([place])    Erectile dysfunction    Hypertension    Vitamin D deficiency    Past Surgical History:  Procedure Laterality Date   COLONOSCOPY     IR DIALY SHUNT INTRO NEEDLE/INTRACATH INITIAL W/IMG LEFT Left 06/25/2020   keloid removed     Family History  Problem Relation Age of Onset   Ovarian cancer Mother     Other Father        unsure of medical history   Social History:   reports that he has quit smoking. He has never used smokeless tobacco. He reports current alcohol use. He reports that he does not use drugs.  Medications  Current Facility-Administered Medications:    fosPHENYtoin (CEREBYX) 1,440 mg PE in sodium chloride 0.9 % 50 mL IVPB, 20 mg PE/kg, Intravenous, Once, Amie Portland, MD   propofol (DIPRIVAN) 1000 MG/100ML infusion, 5-80 mcg/kg/min, Intravenous, Continuous, Long, Wonda Olds, MD, Last Rate: 4.32 mL/hr at 05/28/21 1230, 10 mcg/kg/min at 05/28/21 1230  Current Outpatient Medications:    ACCU-CHEK AVIVA PLUS test strip, 1 each by Other route as needed. , Disp: , Rfl:    ACCU-CHEK SOFTCLIX LANCETS lancets, CHECK BLOOD SUGAR TWO TO THREE TIMES DAILY, Disp: , Rfl:    Alcohol Swabs (B-D SINGLE USE SWABS REGULAR) PADS, USE TO CHECK BLOOD SUGAR AS DIRECTED, Disp: , Rfl:    amLODipine (NORVASC) 10 MG tablet, Take 10 mg by mouth daily., Disp: , Rfl:    aspirin EC 81 MG tablet, Take 81 mg by mouth daily., Disp: , Rfl:    atorvastatin (LIPITOR) 40 MG tablet, Take 40 mg by mouth at bedtime., Disp: , Rfl:    Blood Glucose Monitoring Suppl (GLUCOCOM BLOOD GLUCOSE MONITOR) DEVI, Use as directed.  Dx Code: E11.621, Disp: , Rfl:    calcitRIOL (ROCALTROL) 0.25 MCG capsule, Take 0.25 mcg by mouth daily., Disp: , Rfl:  cholecalciferol (VITAMIN D3) 25 MCG (1000 UNIT) tablet, Take 1,000 Units by mouth daily., Disp: , Rfl:    cyclobenzaprine (FLEXERIL) 10 MG tablet, Take 10 mg by mouth 2 (two) times daily as needed for muscle spasms., Disp: , Rfl:    diclofenac Sodium (VOLTAREN) 1 % GEL, Apply 2 g topically 4 (four) times daily., Disp: , Rfl:    diltiazem (TIAZAC) 180 MG 24 hr capsule, Take 180 mg by mouth daily., Disp: , Rfl:    glipiZIDE (GLUCOTROL XL) 2.5 MG 24 hr tablet, Take 2.5 mg by mouth daily., Disp: , Rfl:    hydrALAZINE (APRESOLINE) 25 MG tablet, Take 25 mg by mouth 3 (three) times daily.,  Disp: , Rfl:    insulin aspart (NOVOLOG) 100 UNIT/ML FlexPen, Inject 0-7 Units into the skin 3 (three) times daily before meals. Sliding scale, Disp: , Rfl:    Insulin Pen Needle 31G X 8 MM MISC, Use as directed per sliding scale.  Dx Code: E11.621, Disp: , Rfl:    latanoprost (XALATAN) 0.005 % ophthalmic solution, Place 1 drop into both eyes at bedtime., Disp: , Rfl:    lidocaine-prilocaine (EMLA) cream, Apply 1 application topically daily as needed (pain)., Disp: , Rfl:    lisinopril (ZESTRIL) 5 MG tablet, Take 5 mg by mouth daily., Disp: , Rfl:    Naftifine HCl 2 % GEL, Apply between toes twice daily (Patient taking differently: Apply 1 application topically daily. Apply between toes), Disp: 60 g, Rfl: 2   sevelamer carbonate (RENVELA) 800 MG tablet, Take 2,400 mg by mouth 3 (three) times daily with meals., Disp: , Rfl:    sildenafil (REVATIO) 20 MG tablet, Take 40-60 mg by mouth daily as needed (ED). , Disp: , Rfl:    sodium bicarbonate 650 MG tablet, Take 1 tablet (650 mg total) by mouth 2 (two) times daily. (Patient taking differently: Take 650 mg by mouth daily.), Disp: 60 tablet, Rfl: 0   TRADJENTA 5 MG TABS tablet, Take 5 mg by mouth daily., Disp: , Rfl:    TRESIBA FLEXTOUCH 100 UNIT/ML SOPN FlexTouch Pen, Inject 25 Units into the skin daily., Disp: , Rfl:   Exam: Current vital signs: BP (!) 227/143   Pulse (!) 116   Resp 17   Wt 72 kg   SpO2 97%   BMI 22.78 kg/m  Vital signs in last 24 hours: Pulse Rate:  [116] 116 (08/12 1222) Resp:  [17] 17 (08/12 1222) BP: (227)/(143) 227/143 (08/12 1222) SpO2:  [97 %] 97 % (08/12 1222) Weight:  [72 kg] 72 kg (08/12 1200)  GENERAL: Critically ill appearing African American male with witnessed seizure activity on presentation Psych: UTA due to patient's condition Head: Normocephalic and atraumatic, without obvious abnormality EENT: Normal conjunctivae, drooling at the mouth, no OP obstruction LUNGS: Tachypnea with snoring respirations  while in CT  CV: Tachycardia with irregular rhythm on cardiac monitor ABDOMEN: Soft, non-distended Extremities: warm, well perfused, without obvious deformity  NEURO:  Mental Status: Patient is seizing with his eyes open with a forced rightward age and a rightward head turn on initial assessment.  He does not follow commands and does not vocalize.  His right upper extremity is flaccid with myoclonic jerking throughout his other extremities. Cranial Nerves:  II: PERRL.  III, IV, VI: Forced rightward gaze noted  V: Does not blink to threat throughout, unable to assess facial sensation VII: Face appears symmetric  VIII: Unable to assess due to patient's condition IX, X, XI: Patient does not  cough or gag with suctioning of oral secretions, does not shrug shoulders, head appears grossly midline XII: Unable to assess due to patient's condition Motor: Right upper extremity is flaccid. Left upper extremity is with antigravity movement during witnessed seizures.  Bilateral lower extremities are without antigravity movement but withdraw briskly with application of noxious stimuli without obvious asymmetry. Bulk is normal.  Sensation: Withdraws bilateral lower extremities briskly with application of noxious stimuli  Coordination: Unable to assess due to patient's condition Gait: Deferred  NIHSS: 1a Level of Conscious.: 3 1b LOC Questions: 2 1c LOC Commands: 2 2 Best Gaze: 2 3 Visual: 3 4 Facial Palsy: 0 5a Motor Arm - left: 1 5b Motor Arm - Right:  6a Motor Leg - Left: 3 6b Motor Leg - Right: 3 7 Limb Ataxia: 0 8 Sensory: 0 9 Best Language: 3 10 Dysarthria: 2 11 Extinct. and Inatten.: 0 TOTAL: 28  Labs I have reviewed labs in epic and the results pertinent to this consultation are: CBC    Component Value Date/Time   WBC 8.0 05/28/2021 1157   RBC 3.84 (L) 05/28/2021 1157   HGB 12.6 (L) 05/28/2021 1206   HCT 37.0 (L) 05/28/2021 1206   PLT 146 (L) 05/28/2021 1157   MCV 91.7  05/28/2021 1157   MCH 31.0 05/28/2021 1157   MCHC 33.8 05/28/2021 1157   RDW 13.4 05/28/2021 1157   LYMPHSABS 0.4 (L) 05/28/2021 1157   MONOABS 0.4 05/28/2021 1157   EOSABS 0.0 05/28/2021 1157   BASOSABS 0.1 05/28/2021 1157   CMP     Component Value Date/Time   NA 130 (L) 05/28/2021 1206   K 4.8 05/28/2021 1206   CL 96 (L) 05/28/2021 1206   CO2 23 12/17/2020 1552   GLUCOSE >700 (HH) 05/28/2021 1206   BUN 72 (H) 05/28/2021 1206   CREATININE 9.60 (H) 05/28/2021 1206   CALCIUM 9.3 12/17/2020 1552   PROT 6.9 11/07/2019 0717   ALBUMIN 2.9 (L) 11/08/2019 0411   AST 14 (L) 11/07/2019 0717   ALT 16 11/07/2019 0717   ALKPHOS 65 11/07/2019 0717   BILITOT 0.3 11/07/2019 0717   GFRNONAA 5 (L) 12/17/2020 1552   GFRAA 9 (L) 11/08/2019 0411    Imaging I have reviewed the images obtained:  CT-scan of the brain 05/28/2021: Significantly motion degraded. There is no acute intracranial hemorrhage or definite evidence of acute infarction. ASPECT score is 10.  Assessment: 74 y.o. male who was found down in the bathroom at his dialysis clinic, confused who had new onset status epilepticus in the setting of severe hyperglycemia en route to the ED.  - Examination at the bridge revealed patient with a rightward head turn, flaccid RUE, forced right gaze, and myoclonic jerking of the LUE and bilateral lower extremities with intermittent left arm raising. Immediately patient was given 2 mg IV Ativan and loaded with 1,000 mg Keppra IV without seizure resolution. He was given another 2 mg of IV Ativan and intubated for airway protection followed by a 20 mg PE/kg load of fosphenytoin. - CTH was significantly motion degraded but within that limitation, there is no acute ICH or definite evidence of acute infarction.  - Initial lab work reveals a blood glucose of > 700 - Presentation concerning for provoked status epilepticus in the setting of severe hyperglycemia   Impression: New onset status  epilepticus Severe hyperglycemia likely provoking the seizures and status epilepticus ESRD on hemodialysis   Recommendations: - LTM EEG ordered-routine EEG with sharply contoured rhythmic slowing  in bilateral hemispheres left greater than right frontal.  This pattern is unlikely to ictal continuum with low potential for seizures. - Continue Keppra 500 mg BID IV or PO  - Loaded with fosphenytoin 20 mg PE/kg  - On propofol gtt following intubation  - Inpatient seizure precautions - Management of metabolic derangements / hyperglycemia per EDP/primary team - CXR, UA, blood cultures for infectious work up  - MRI brain when able to obtain in the setting of new onset seizure activity  - 2 mg IV Ativan PRN for any seizure lasting > 5 minutes and notify neurology   Pt seen by NP/Neuro and later by MD. Note/plan to be edited by MD as needed.  Anibal Henderson, AGAC-NP [facility] Pager: [phone]  Attending addendum Patient seen and examined Imaging personally reviewed Came in as a code stroke-actively seizing with right gaze deviation. Agree with the examination above that I independently performed and confirmed. Agree with the plan above that I helped formulate. Plan discussed with Dr. Laverta Baltimore  -- Amie Portland, MD Neurologist [facility] Pager: [phone]  CRITICAL CARE ATTESTATION Performed by: Amie Portland, MD Total critical care time: 39 minutes Critical care time was exclusive of separately billable procedures and treating other patients and/or supervising APPs/Residents/Students Critical care was necessary to treat or prevent imminent or life-threatening deterioration due to status epilepticus, This patient is critically ill and at significant risk for neurological worsening and/or death and care requires constant monitoring. Critical care was time spent personally by me on the following activities: development of treatment plan with patient and/or  surrogate as well as nursing, discussions with consultants, evaluation of patient's response to treatment, examination of patient, obtaining history from patient or surrogate, ordering and performing treatments and interventions, ordering and review of laboratory studies, ordering and review of radiographic studies, pulse oximetry, re-evaluation of patient's condition, participation in multidisciplinary rounds and medical decision making of high complexity in the care of this patient.

## 2021-05-28 NOTE — Progress Notes (Signed)
Arrived to patient's room and assessed PIV sites. Nurse stated "have enough sites right now. Instructed to re-consult if needing PIV in the future.. VU Fran Lowes, RN VAST

## 2021-05-28 NOTE — ED Notes (Signed)
Dr Laverta Baltimore notified of critical lab result for CBG 777

## 2021-05-29 ENCOUNTER — Inpatient Hospital Stay (HOSPITAL_COMMUNITY): Payer: Medicare Other

## 2021-05-29 DIAGNOSIS — J9601 Acute respiratory failure with hypoxia: Secondary | ICD-10-CM

## 2021-05-29 DIAGNOSIS — G40901 Epilepsy, unspecified, not intractable, with status epilepticus: Secondary | ICD-10-CM | POA: Diagnosis not present

## 2021-05-29 DIAGNOSIS — I161 Hypertensive emergency: Secondary | ICD-10-CM | POA: Diagnosis not present

## 2021-05-29 DIAGNOSIS — E1310 Other specified diabetes mellitus with ketoacidosis without coma: Secondary | ICD-10-CM

## 2021-05-29 DIAGNOSIS — R402432 Glasgow coma scale score 3-8, at arrival to emergency department: Secondary | ICD-10-CM

## 2021-05-29 LAB — GLUCOSE, CAPILLARY
Glucose-Capillary: 120 mg/dL — ABNORMAL HIGH (ref 70–99)
Glucose-Capillary: 124 mg/dL — ABNORMAL HIGH (ref 70–99)
Glucose-Capillary: 135 mg/dL — ABNORMAL HIGH (ref 70–99)
Glucose-Capillary: 146 mg/dL — ABNORMAL HIGH (ref 70–99)
Glucose-Capillary: 153 mg/dL — ABNORMAL HIGH (ref 70–99)
Glucose-Capillary: 157 mg/dL — ABNORMAL HIGH (ref 70–99)
Glucose-Capillary: 169 mg/dL — ABNORMAL HIGH (ref 70–99)
Glucose-Capillary: 204 mg/dL — ABNORMAL HIGH (ref 70–99)
Glucose-Capillary: 209 mg/dL — ABNORMAL HIGH (ref 70–99)
Glucose-Capillary: 277 mg/dL — ABNORMAL HIGH (ref 70–99)

## 2021-05-29 LAB — CBC
HCT: 30.8 % — ABNORMAL LOW (ref 39.0–52.0)
Hemoglobin: 10.7 g/dL — ABNORMAL LOW (ref 13.0–17.0)
MCH: 31.1 pg (ref 26.0–34.0)
MCHC: 34.7 g/dL (ref 30.0–36.0)
MCV: 89.5 fL (ref 80.0–100.0)
Platelets: 130 10*3/uL — ABNORMAL LOW (ref 150–400)
RBC: 3.44 MIL/uL — ABNORMAL LOW (ref 4.22–5.81)
RDW: 13.6 % (ref 11.5–15.5)
WBC: 8.4 10*3/uL (ref 4.0–10.5)
nRBC: 0 % (ref 0.0–0.2)

## 2021-05-29 LAB — MAGNESIUM
Magnesium: 2 mg/dL (ref 1.7–2.4)
Magnesium: 2 mg/dL (ref 1.7–2.4)

## 2021-05-29 LAB — PHOSPHORUS
Phosphorus: 2.8 mg/dL (ref 2.5–4.6)
Phosphorus: 3.2 mg/dL (ref 2.5–4.6)

## 2021-05-29 LAB — HEMOGLOBIN A1C
Hgb A1c MFr Bld: 11.9 % — ABNORMAL HIGH (ref 4.8–5.6)
Mean Plasma Glucose: 294.83 mg/dL

## 2021-05-29 LAB — BASIC METABOLIC PANEL
Anion gap: 14 (ref 5–15)
BUN: 64 mg/dL — ABNORMAL HIGH (ref 8–23)
CO2: 23 mmol/L (ref 22–32)
Calcium: 8.4 mg/dL — ABNORMAL LOW (ref 8.9–10.3)
Chloride: 102 mmol/L (ref 98–111)
Creatinine, Ser: 10.04 mg/dL — ABNORMAL HIGH (ref 0.61–1.24)
GFR, Estimated: 5 mL/min — ABNORMAL LOW (ref >60)
Glucose, Bld: 143 mg/dL — ABNORMAL HIGH (ref 70–99)
Potassium: 3.5 mmol/L (ref 3.5–5.1)
Sodium: 139 mmol/L (ref 135–145)

## 2021-05-29 LAB — HEPATITIS B SURFACE ANTIGEN: Hepatitis B Surface Ag: NONREACTIVE

## 2021-05-29 LAB — TRIGLYCERIDES: Triglycerides: 87 mg/dL (ref <150)

## 2021-05-29 IMAGING — DX DG ABD PORTABLE 1V
1 series · 1 of 1 positions shown · non-contrast
Comparison: None.

CLINICAL DATA: OG tube.

EXAM:
PORTABLE ABDOMEN - 1 VIEW

[abdomen]
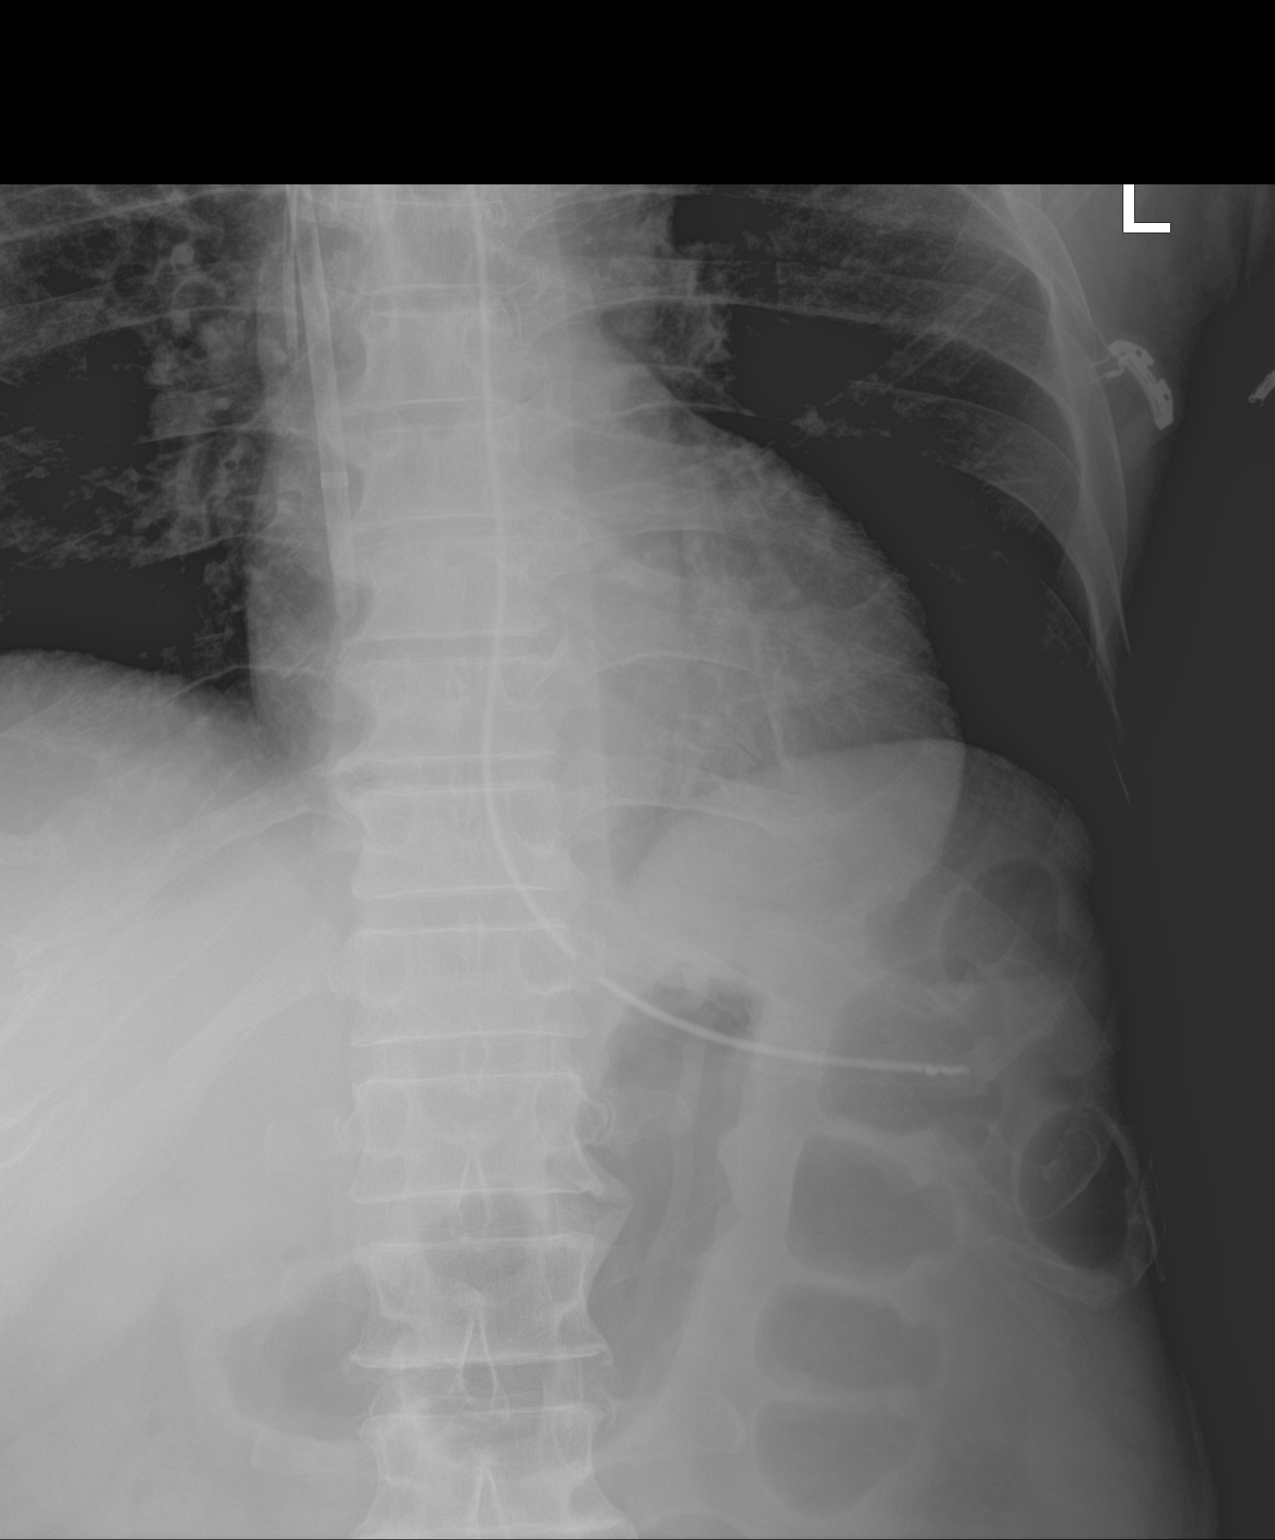

[1 of 1 positions shown; findings below may reference images not displayed]

FINDINGS: Tip and side port of the enteric tube are below the diaphragm in the
stomach. Nonobstructive bowel gas pattern in the upper abdomen.
Dialysis catheter tip at the atrial caval junction and central line
tip in the SVC or partially included
IMPRESSION: Tip and side port of the enteric tube below the diaphragm in the
stomach.

## 2021-05-29 MED ORDER — INSULIN ASPART 100 UNIT/ML IJ SOLN
0.0000 [IU] | INTRAMUSCULAR | Status: DC
  Administered 2021-05-29: 2 [IU] via SUBCUTANEOUS
  Administered 2021-05-29: 8 [IU] via SUBCUTANEOUS
  Administered 2021-05-29: 5 [IU] via SUBCUTANEOUS
  Administered 2021-05-30: 2 [IU] via SUBCUTANEOUS
  Administered 2021-05-30: 8 [IU] via SUBCUTANEOUS
  Administered 2021-05-30: 5 [IU] via SUBCUTANEOUS
  Administered 2021-05-30: 2 [IU] via SUBCUTANEOUS
  Administered 2021-05-30 – 2021-05-31 (×3): 5 [IU] via SUBCUTANEOUS
  Administered 2021-06-01: 3 [IU] via SUBCUTANEOUS
  Administered 2021-06-01: 8 [IU] via SUBCUTANEOUS
  Administered 2021-06-01: 11 [IU] via SUBCUTANEOUS
  Administered 2021-06-01 (×2): 2 [IU] via SUBCUTANEOUS
  Administered 2021-06-02: 8 [IU] via SUBCUTANEOUS
  Administered 2021-06-02 (×3): 3 [IU] via SUBCUTANEOUS
  Administered 2021-06-02: 2 [IU] via SUBCUTANEOUS
  Administered 2021-06-03: 11 [IU] via SUBCUTANEOUS
  Administered 2021-06-03: 8 [IU] via SUBCUTANEOUS
  Administered 2021-06-03: 2 [IU] via SUBCUTANEOUS
  Administered 2021-06-03: 3 [IU] via SUBCUTANEOUS

## 2021-05-29 MED ORDER — HEPARIN SODIUM (PORCINE) 1000 UNIT/ML IJ SOLN
INTRAMUSCULAR | Status: AC
  Administered 2021-05-29: 1000 [IU]
  Filled 2021-05-29: qty 4

## 2021-05-29 MED ORDER — INSULIN ASPART 100 UNIT/ML IJ SOLN
1.0000 [IU] | INTRAMUSCULAR | Status: DC
  Administered 2021-05-29: 3 [IU] via SUBCUTANEOUS

## 2021-05-29 MED ORDER — SODIUM CHLORIDE 0.9% FLUSH: 10.0000 mL | INTRAVENOUS | Status: DC | PRN

## 2021-05-29 MED ORDER — INSULIN DETEMIR 100 UNIT/ML ~~LOC~~ SOLN
5.0000 [IU] | Freq: Two times a day (BID) | SUBCUTANEOUS | Status: DC
  Administered 2021-05-29: 5 [IU] via SUBCUTANEOUS
  Filled 2021-05-29 (×3): qty 0.05

## 2021-05-29 MED ORDER — INSULIN GLARGINE-YFGN 100 UNIT/ML ~~LOC~~ SOLN
27.0000 [IU] | Freq: Once | SUBCUTANEOUS | Status: AC
  Administered 2021-05-29: 27 [IU] via SUBCUTANEOUS
  Filled 2021-05-29: qty 0.27

## 2021-05-29 MED ORDER — INSULIN GLARGINE-YFGN 100 UNIT/ML ~~LOC~~ SOLN
32.0000 [IU] | Freq: Every day | SUBCUTANEOUS | Status: DC
  Administered 2021-05-30: 32 [IU] via SUBCUTANEOUS
  Filled 2021-05-29 (×3): qty 0.32

## 2021-05-29 MED ORDER — VITAL HIGH PROTEIN PO LIQD
1000.0000 mL | ORAL | Status: DC
  Administered 2021-05-29: 1000 mL

## 2021-05-29 MED ORDER — PROSOURCE TF PO LIQD
45.0000 mL | Freq: Two times a day (BID) | ORAL | Status: DC
  Administered 2021-05-29 – 2021-05-30 (×4): 45 mL
  Filled 2021-05-29 (×3): qty 45

## 2021-05-29 MED ORDER — SODIUM CHLORIDE 0.9% FLUSH
10.0000 mL | Freq: Two times a day (BID) | INTRAVENOUS | Status: DC
  Administered 2021-05-29 – 2021-06-01 (×8): 10 mL

## 2021-05-29 MED ORDER — VITAL AF 1.2 CAL PO LIQD
1000.0000 mL | ORAL | Status: DC
Start: 2021-05-29 — End: 2021-05-31
  Administered 2021-05-29: 1000 mL

## 2021-05-29 MED ORDER — LEVETIRACETAM 100 MG/ML PO SOLN
500.0000 mg | Freq: Two times a day (BID) | ORAL | Status: DC
  Administered 2021-05-29 – 2021-05-30 (×4): 500 mg
  Filled 2021-05-29 (×4): qty 5

## 2021-05-29 NOTE — Progress Notes (Signed)
Neurology Progress Note  Brief HPI: 75 y.o. male with PMHx of ESRD on hemodialysis, DM2, and HTN who was found down by neighbors with AMS and unable to speak with medic with a blood glucose that read "high". En route, he developed a leftward lead, right gaze, rightward head turn, flaccid RUE, and generalized myoclonic twitching with intermittent left arm rising. Initially given '4mg'$  Ativan and 1,000 mg Keppra for seizure control and intubated for airway protection.   Subjective: Patient remains intubated in the ICU without further evidence of myoclonus Hemodialysis is in session at bedside No acute overnight events noted   Exam: Vitals:   05/29/21 0915 05/29/21 0920  BP: (!) 157/103   Pulse: 86   Resp: 16   Temp:  (!) 97.4 F (36.3 C)  SpO2: 100%    Gen: Intubated, sedation paused with restless patient movements in the ICU Resp: ETT in place, patient initiating own breaths on pressure support, SpO2 100% on telemetry Abd: soft, non-distended  Neuro: Mental Status: Intubated, with sedation held for approximately 15 minutes on assessment this morning. He does not open eyes, does not follow commands. He is restless off of sedation but moves all extremities equally without noted asymmetry Unable to assess speech or orientation due to patient's condition. Cranial Nerves: Right pupil 32m, left pupil 4 mm briskly reactive to light, gaze is fixed midline, oculocephalic and corneal reflexes are not intact, he does not fixate or track examiner, unable to assess patient's hearing due to patient condition- does not open eyes or follow commands, head cough and gag reflexes are intact, head appears grossly midline. Motor: Restless movements of all extremities present without noted asymmetry.  Withdraws each extremity briskly with application of noxious stimuli.  Tone and bulk are normal Sensory: Withdraws and grimaces with application of noxious stimuli throughout DTR: 2+ and symmetric biceps, 1+ and  symmetric patellae Gait: Deferred  Pertinent Labs: CBC    Component Value Date/Time   WBC 8.4 05/29/2021 0539   RBC 3.44 (L) 05/29/2021 0539   HGB 10.7 (L) 05/29/2021 0539   HCT 30.8 (L) 05/29/2021 0539   PLT 130 (L) 05/29/2021 0539   MCV 89.5 05/29/2021 0539   MCH 31.1 05/29/2021 0539   MCHC 34.7 05/29/2021 0539   RDW 13.6 05/29/2021 0539   LYMPHSABS 0.4 (L) 05/28/2021 1157   MONOABS 0.4 05/28/2021 1157   EOSABS 0.0 05/28/2021 1157   BASOSABS 0.1 05/28/2021 1157   CMP     Component Value Date/Time   NA 139 05/29/2021 0539   K 3.5 05/29/2021 0539   CL 102 05/29/2021 0539   CO2 23 05/29/2021 0539   GLUCOSE 143 (H) 05/29/2021 0539   BUN 64 (H) 05/29/2021 0539   CREATININE 10.04 (H) 05/29/2021 0539   CALCIUM 8.4 (L) 05/29/2021 0539   PROT 7.4 05/28/2021 1157   ALBUMIN 4.1 05/28/2021 1157   AST 36 05/28/2021 1157   ALT 20 05/28/2021 1157   ALKPHOS 69 05/28/2021 1157   BILITOT 0.8 05/28/2021 1157   GFRNONAA 5 (L) 05/29/2021 0539   GFRAA 9 (L) 11/08/2019 0411   Urinalysis    Component Value Date/Time   COLORURINE YELLOW 11/07/2019 0818   APPEARANCEUR HAZY (A) 11/07/2019 0818   LABSPEC 1.011 11/07/2019 0818   PHURINE 5.0 11/07/2019 0818   GLUCOSEU >=500 (A) 11/07/2019 0818   HGBUR SMALL (A) 11/07/2019 0818   BILIRUBINUR NEGATIVE 11/07/2019 0818   KETONESUR NEGATIVE 11/07/2019 0818   PROTEINUR >=300 (A) 11/07/2019 0818   NITRITE NEGATIVE  11/07/2019 0818   LEUKOCYTESUR NEGATIVE 11/07/2019 0818    Imaging Reviewed:  CT-scan of the brain 05/28/2021: Significantly motion degraded. There is no acute intracranial hemorrhage or definite evidence of acute infarction. ASPECT score is 10.  DG Chest 05/28/2021: 1. New left internal jugular central venous catheter with tip overlying the mid distal SVC. No pneumothorax. 2. New enteric tube with tip in the stomach, side port is in the region of the gastroesophageal junction. Recommend advancement of at least 3 cm for optimal  placement. 3. Other support apparatus unchanged. 4. Improving left basilar atelectasis.  Overnight EEG 05/28/2021 - 05/29/2021: "This study is suggestive of cortical dysfunction in right hemisphere likely secondary underlying structural abnormality, postictal state.  There is also moderate to severe diffuse encephalopathy, nonspecific etiology but likely related to sedation.  No seizures or definite epileptiform discharges were seen throughout the recording."  Routine EEG 05/28/2021: "This study showed sharply contoured rhythmic slowing in bilateral left m>right frontal region.  This EEG pattern is on the ictal-interictal continuum with low potential for seizures. There is also cortical dysfunction in right hemisphere likely secondary underlying structural abnormality, postictal state.  There is also moderate to severe diffuse encephalopathy, nonspecific etiology but likely related to sedation.  No seizures or definite epileptiform discharges were seen throughout the recording.  Assessment: 76 year old man with past medical history of ESRD on hemodialysis, diabetes, hypertension brought in with altered mental status at dialysis with sugars too high to read on EMS glucometers with gaze to the right and frothing at the mouth with right-sided weakness followed by abnormal posturing of the whole body concerning for generalized tonic-clonic seizure.  Remained in seizure for a prolonged period of time requiring emergent intubation and treatment with benzodiazepines and antiepileptics for the presumed status epilepticus. EEG with generalized and right-sided slowing concerning for structural abnormality versus postictal state.  Also reveals moderate diffuse encephalopathy.  Impression:  New onset status epilepticus Severe hyperglycemia likely provoking the seizures and status epilepticus ESRD on hemodialysis   Recommendations: - Continue LTM for 24 hours in the setting of decreased sedation  - Inpatient  seizure precautions -Continue Keppra - PRN Ativan 2 mg IV for any seizure lasting > 5 minutes and notify neurology  - Neurology will continue to follow  Anibal Henderson, AGACNP-BC [facility] [phone]    Attending Neurohospitalist Addendum Patient seen and examined with APP/Resident. Agree with the history and physical as documented above. Agree with the plan as documented, which I helped formulate. I have independently reviewed the chart, obtained history, review of systems and examined the patient.I have personally reviewed pertinent head/neck/spine imaging (CT/MRI).  Patient seen in the emergency room as an acute code stroke but noted to have rightward gaze, right-sided weakness, then generalized seizure with frothing at the mouth, inability protect airway.  Intubated for airway protection.  Treated with benzodiazepines and load of Keppra and fosphenytoin. Continue Keppra, continue EEG for 1 more day. Will continue to follow. Please feel free to call with any questions. Plan relayed to Dr. Shearon Stalls  -- Amie Portland, MD Neurologist [facility] Pager: [phone]  CRITICAL CARE ATTESTATION Performed by: Amie Portland, MD Total critical care time: 31 minutes Critical care time was exclusive of separately billable procedures and treating other patients and/or supervising APPs/Residents/Students Critical care was necessary to treat or prevent imminent or life-threatening deterioration due to provoked seizure, status epilepticus, DKA  This patient is critically ill and at significant risk for neurological worsening and/or death and care requires constant monitoring.  Critical care was time spent personally by me on the following activities: development of treatment plan with patient and/or surrogate as well as nursing, discussions with consultants, evaluation of patient's response to treatment, examination of patient, obtaining history from patient or surrogate,  ordering and performing treatments and interventions, ordering and review of laboratory studies, ordering and review of radiographic studies, pulse oximetry, re-evaluation of patient's condition, participation in multidisciplinary rounds and medical decision making of high complexity in the care of this patient.

## 2021-05-29 NOTE — Progress Notes (Signed)
Initial Nutrition Assessment  DOCUMENTATION CODES:   Not applicable  INTERVENTION:  Initiate TF via OGT with Vital AF 1.2 cal formula at goal rate of 55 ml/h (1320 ml per day)   Prostat 45 ml BID per tube.   Tube feeding regimen with current propofol rate to provide 1892 kcals, 121 gm protein, 1071 ml free water daily.  NUTRITION DIAGNOSIS:   Inadequate oral intake related to inability to eat as evidenced by NPO status.  GOAL:   Patient will meet greater than or equal to 90% of their needs  MONITOR:   Vent status, TF tolerance, Skin, Weight trends, Labs, I & O's  REASON FOR ASSESSMENT:   Consult Enteral/tube feeding initiation and management  ASSESSMENT:   75 y.o. male with PMHx of ESRD on hemodialysis, DM2, and HTN who was found down with AMS. Pt presents in DKA and abnormal posturing of the whole body concerning for generalized tonic-clonic seizure. Initially given Ativan and Keppra for seizure control and intubated for airway protection.  Patient is currently intubated on ventilator support MV: 8.2 L/min Temp (24hrs), Avg:97.7 F (36.5 C), Min:96.9 F (36.1 C), Max:98.2 F (36.8 C)  Propofol: 8.64 ml/hr which provides 228 kcal/day.   Hemodialysis in session at bedside. RD to order tube feeding. Unable to complete Nutrition-Focused physical exam at this time.   Labs and medications reviewed.   Diet Order:   Diet Order             Diet NPO time specified  Diet effective now                   EDUCATION NEEDS:   Not appropriate for education at this time  Skin:  Skin Assessment: Reviewed RN Assessment  Last BM:  Unknown  Height:   Ht Readings from Last 1 Encounters:  05/29/21 '5\' 10"'$  (1.778 m)    Weight:   Wt Readings from Last 1 Encounters:  05/29/21 60 kg    Ideal Body Weight:  75.45 kg  BMI:  Body mass index is 18.98 kg/m.  Estimated Nutritional Needs:   Kcal:  1600-1800  Protein:  115-130 grams  Fluid:  >/= 1.6  L/day  Corrin Parker, MS, RD, LDN RD pager number/after hours weekend pager number on [employer].

## 2021-05-29 NOTE — Progress Notes (Signed)
NAME:  Jonathan Kane, MRN:  [id number], DOB:  28-Jun-1946, LOS: 1 ADMISSION DATE:  05/28/2021, CONSULTATION DATE: 05/28/2021 REFERRING MD:  Dr. Laverta Baltimore, CHIEF COMPLAINT: Altered mental status  History of Present Illness:  75 yo male former smoker last seen normal on 05/27/21 at 9 am was found by neighbor on floor at 11 am on 05/28/21.  Found to have CBG > 600.  Was able to communicate with EMS and assist with getting on gurney.  En route to ED he developed Rt gaze preference and Lt side weakness.  Code stroke called.  He developed seizure and required intubation for airway protection.  Had SBP 229/127 in ER.    Pertinent  Medical History  DM type 2, ESRD on HD, ED, HTN, Vit D deficiency, Cocaine abuse, Hep C, HSV, DM retinopathy, BPH, ETOH, Secondary hyperparathyroidism  Significant Hospital Events: Including procedures, antibiotic start and stop dates in addition to other pertinent events   8/12 Admit, neurology and nephrology consulted, started on insulin gtt, EEG monitoring  Interim History / Subjective:  Off insulin gtt this morning. Deeply sedated on cEEG.   Objective   Blood pressure 126/84, pulse 75, temperature (!) 97.3 F (36.3 C), temperature source Oral, resp. rate 16, height '5\' 10"'$  (1.778 m), weight 61.1 kg, SpO2 100 %.    Vent Mode: PRVC FiO2 (%):  [40 %-100 %] 40 % Set Rate:  [16 bmp] 16 bmp Vt Set:  [580 mL] 580 mL PEEP:  [5 cmH20] 5 cmH20 Plateau Pressure:  [16 cmH20-18 cmH20] 18 cmH20   Intake/Output Summary (Last 24 hours) at 05/29/2021 0725 Last data filed at 05/29/2021 0700 Gross per 24 hour  Intake 1243.01 ml  Output --  Net 1243.01 ml   Filed Weights   05/28/21 1200 05/28/21 1545 05/29/21 0446  Weight: 72 kg 68.6 kg 61.1 kg    Examination:  General - sedated, not responsive, on cEEG ENT - ETT in place Cardiac - RRR Chest - dependent crackles, non labored breathing Abdomen - soft, non tender, decreased bowel sounds Extremities - av fistula Lt arm +thrill  and bruit Neuro - RASS -3  Na 139 Cr 10.04 BUN 64 K 3.5 WBC 8.4 Hgb 10.7 Plt 130  CBG 204 CXR 8/12 personally reviewed - left IJ CVC placed, no ptx, feeding tube, no acute pulmonary process.   EEG 8/12 shows likely post-ictal state, generalized slow lateralization in the right hemisphere. No seizures noted.   Resolved Hospital Problem list     Assessment & Plan:  Jonathan Kane is a 75 y.o. man with history of DM2 and ESRD who was found down in Dialysis clinic.  Presents with:  Acute metabolic encephalopathy with hyperglycemia, hypertensive emergency, and new onset seizure with status epilepticus. - neurology consulted. Head ct negative for acute process. Will plan for MRI brain pending cEEG - defer AEDs to neurology - RASS goal -1  Acute hypoxemic respiratory failure - intubated due to clinical coma, status epilepticus - maintain LTVV, lung protective strategies - VAP bundle ordered  DM type 2 poorly controlled with hyperglycemia. - insulin gtt now off - adding back home dose long acting insulin  ESRD on HD. - nephrology consulted, usual day is MWF. Missed yesterday so will discuss with nephrology if this should happen today - appears euvolemic   HTN emergency Hx of HLD. - emergency improved - goal SBP < 185, DBP < 105 - prn IV labetalol - continue aspirin, lipitor, diltiazem, lisinopril, hydralazine  Best Practice (right  click and "Reselect all SmartList Selections" daily)   Diet/type: NPO w/ meds via tube DVT prophylaxis: prophylactic heparin  GI prophylaxis: PPI Lines: Central line Foley:  N/A Code Status:  full code Last date of multidisciplinary goals of care: left message for update to daughter Clarene Critchley at 9:29 am.   Labs   CBC: Recent Labs  Lab 05/28/21 1157 05/28/21 1206 05/28/21 1318 05/28/21 1346 05/29/21 0539  WBC 8.0  --   --   --  8.4  NEUTROABS 7.1  --   --   --   --   HGB 11.9* 12.6* 11.9* 12.9* 10.7*  HCT 35.2* 37.0* 35.0* 38.0*  30.8*  MCV 91.7  --   --   --  89.5  PLT 146*  --   --   --  130*    Basic Metabolic Panel: Recent Labs  Lab 05/28/21 1157 05/28/21 1206 05/28/21 1318 05/28/21 1346 05/29/21 0539  NA 129* 130* 130* 132* 139  K 4.7 4.8 3.9 4.1 3.5  CL 92* 96*  --   --  102  CO2 21*  --   --   --  23  GLUCOSE 777* >700*  --   --  143*  BUN 58* 72*  --   --  64*  CREATININE 9.02* 9.60*  --   --  10.04*  CALCIUM 8.1*  --   --   --  8.4*   GFR: Estimated Creatinine Clearance: 5.7 mL/min (A) (by C-G formula based on SCr of 10.04 mg/dL (H)). Recent Labs  Lab 05/28/21 1157 05/29/21 0539  WBC 8.0 8.4    Liver Function Tests: Recent Labs  Lab 05/28/21 1157  AST 36  ALT 20  ALKPHOS 69  BILITOT 0.8  PROT 7.4  ALBUMIN 4.1   No results for input(s): LIPASE, AMYLASE in the last 168 hours. No results for input(s): AMMONIA in the last 168 hours.  ABG    Component Value Date/Time   PHART 7.394 05/28/2021 1318   PCO2ART 38.7 05/28/2021 1318   PO2ART 428 (H) 05/28/2021 1318   HCO3 20.4 05/28/2021 1346   TCO2 21 (L) 05/28/2021 1346   ACIDBASEDEF 4.0 (H) 05/28/2021 1346   O2SAT 95.0 05/28/2021 1346     Coagulation Profile: Recent Labs  Lab 05/28/21 1157  INR 1.1    Cardiac Enzymes: No results for input(s): CKTOTAL, CKMB, CKMBINDEX, TROPONINI in the last 168 hours.  HbA1C: Hgb A1c MFr Bld  Date/Time Value Ref Range Status  11/07/2019 07:17 AM 10.1 (H) 4.8 - 5.6 % Final    Comment:    (NOTE) Pre diabetes:          5.7%-6.4% Diabetes:              >6.4% Glycemic control for   <7.0% adults with diabetes     CBG: Recent Labs  Lab 05/29/21 0208 05/29/21 0413 05/29/21 0519 05/29/21 0617 05/29/21 0708  GLUCAP 153* 157* 124* 135* 204*     Critical care time: 35 minutes   The patient is critically ill due to status epilepticus, respiratory failure, hyperglycemia, encephalopathy.  Critical care was necessary to treat or prevent imminent or life-threatening deterioration.   Critical care was time spent personally by me on the following activities: development of treatment plan with patient and/or surrogate as well as nursing, discussions with consultants, evaluation of patient's response to treatment, examination of patient, obtaining history from patient or surrogate, ordering and performing treatments and interventions, ordering and review of laboratory studies,  ordering and review of radiographic studies, pulse oximetry, re-evaluation of patient's condition and participation in multidisciplinary rounds.   Critical Care Time devoted to patient care services described in this note is 35 minutes. This time reflects time of care of this [facility] . This critical care time does not reflect separately billable procedures or procedure time, teaching time or supervisory time of PA/NP/Med student/Med Resident etc but could involve care discussion time.       Spero Geralds [facility] 05/29/2021 7:26 AM  Pager: see AMION  If no response to pager , please call critical care on call (see AMION) until 7pm After 7:00 pm call Elink

## 2021-05-29 NOTE — Progress Notes (Signed)
LTM maintenance completed; half the leads had come off since this AM; fixed, O1, O2, P3, P4, Pz, Cz, ground, ref, A2, T8, P7. Added head wrap. No skin breakdown was seen.

## 2021-05-29 NOTE — Progress Notes (Signed)
[facility] Progress Note  Subjective: seejn in ICU, on the vent  Vitals:   05/29/21 1900 05/29/21 1934 05/29/21 1953 05/29/21 2000  BP: 119/86 119/86  (!) 168/101  Pulse: 92 95  98  Resp: 16 18  18  Temp:   98.8 F (37.1 C)   TempSrc:   Oral   SpO2: 100% 100%  100%  Weight:      Height:        Exam:  on vent ,sedated  no jvd  throat ett in place  Chest cta bilat and lat  Cor reg no RG  Abd soft ntnd no ascites   Ext no LE edema   Neuro on vent and sedated, not following commands   LFA AVF+bruit/  R IJ TDC        Home meds include norvasc, asa, lipitor, diltiazem, glipizide, hydralazine, insulin aspart, lisinopril, renvela 3 ac tid, sod bicarb, tradjenta , tresiba insulin, prns        OP HD: [facility] M[facility]      400/ 1.5  70.5kg   2/2.5      Assessment/ Plan: AMS - w/ uncont diabetes (BS > 600), HTN emergency and new seizures.  Getting IV insulin and BP meds. Neuro consulting.  ESRD - on HD MWF. HD today off schedule.  VDRF - per CCM DM2/ hyperglycemia - sp insulin gtt, back on SQ insulin now.  HTN urgency - getting home BP lowering meds x3 per NG. BP's are better.  Volume - doesn't look vol overloaded. Could not reach UF goal due to large BP drop on HD today. Pt is down 10kg by wts (?). No vol excess on exam.        Rob Schertz 05/29/2021, 8:58 PM   Recent Labs  Lab 05/28/21 1157 05/28/21 1206 05/28/21 1318 05/28/21 1346 05/29/21 0539 05/29/21 1326 05/29/21 1710  K 4.7 4.8   < > 4.1 3.5  --   --   BUN 58* 72*  --   --  64*  --   --   CREATININE 9.02* 9.60*  --   --  10.04*  --   --   CALCIUM 8.1*  --   --   --  8.4*  --   --   PHOS  --   --   --   --   --  2.8 3.2  HGB 11.9* 12.6*   < > 12.9* 10.7*  --   --    < > = values in this interval not displayed.   Inpatient medications:  aspirin  81 mg Per Tube Daily   atorvastatin  40 mg Per Tube QHS   chlorhexidine gluconate (MEDLINE KIT)  15 mL Mouth Rinse BID   Chlorhexidine Gluconate  Cloth  6 each Topical Daily   Chlorhexidine Gluconate Cloth  6 each Topical Q0600   cholecalciferol  1,000 Units Per Tube Daily   diltiazem  60 mg Per Tube Q8H   docusate  100 mg Per Tube BID   feeding supplement (PROSource TF)  45 mL Per Tube BID   heparin injection (subcutaneous)  5,000 Units Subcutaneous Q8H   hydrALAZINE  25 mg Per Tube Q8H   insulin aspart  0-15 Units Subcutaneous Q4H   [START ON 05/30/2021] insulin glargine-yfgn  32 Units Subcutaneous Daily   latanoprost  1 drop Both Eyes QHS   levETIRAcetam  500 mg Per Tube BID   lisinopril  5 mg Per Tube Daily   LORazepam    2 mg Intravenous Once   mouth rinse  15 mL Mouth Rinse 10 times per day   mupirocin ointment  1 application Nasal BID   pantoprazole sodium  40 mg Per Tube Q24H   polyethylene glycol  17 g Per Tube Daily   sodium chloride flush  10-40 mL Intracatheter Q12H    sodium chloride Stopped (05/29/21 0714)   feeding supplement (VITAL AF 1.2 CAL) 1,000 mL (05/29/21 1513)   propofol (DIPRIVAN) infusion 30 mcg/kg/min (05/29/21 1900)   acetaminophen, fentaNYL (SUBLIMAZE) injection, labetalol, midazolam, sodium chloride flush

## 2021-05-29 NOTE — Procedures (Addendum)
Patient Name: Jonathan Kane  MRN: [id number]  Epilepsy Attending: Lora Havens  Referring Physician/Provider: Anibal Henderson, NP Duration: 05/28/2021 1311 to  05/29/2021 1300   Patient history:  75 y.o. male who was found down in the bathroom at his dialysis clinic, confused who had new onset status epilepticus in the setting of severe hyperglycemia en route to the ED. EEG to evaluate for seizure   Level of alertness: Comatose   AEDs during EEG study:  propofol, LEV   Technical aspects: This EEG study was done with scalp electrodes positioned according to the 10-20 International system of electrode placement. Electrical activity was acquired at a sampling rate of '500Hz'$  and reviewed with a high frequency filter of '70Hz'$  and a low frequency filter of '1Hz'$ . EEG data were recorded continuously and digitally stored.    Description: EEG initially showed continuous generalized and lateralized right hemisphere 3 to 6 Hz theta-delta slowing admixed with 15 to 18 Hz beta activity.  There was also sharply contoured 2 to 3 Hz delta slowing in bilateral, left > right frontal region. On 05/28/2021 after around 1500, propofol was started and thereafter EEG showed burst suppression pattern with generalized 2-'3hz'$  delta slowing admixed with 15-'18hz'$  beta activity lasting 2-3 seconds alternating with generalized eeg suppression lasting 3-4 seconds. On 05/29/2021 after around 9am, eeg gradually transitioned to continuous generalized polymorphic sharply contoured 3-'6hz'$  theta-delta slowing.   Parts of study between 05/29/2021 1115 to 1230 were difficult to review due to significant electrode artifact   ABNORMALITY -Continuous slow, generalized and lateralized right hemisphere   IMPRESSION: This study is suggestive of cortical dysfunction in right hemisphere likely secondary underlying structural abnormality, postictal state.  There is also moderate to severe diffuse encephalopathy, nonspecific etiology but likely  related to sedation.  No seizures or definite epileptiform discharges were seen throughout the recording.   Anjelique Makar [name]

## 2021-05-30 ENCOUNTER — Inpatient Hospital Stay (HOSPITAL_COMMUNITY): Payer: Medicare Other

## 2021-05-30 DIAGNOSIS — N186 End stage renal disease: Secondary | ICD-10-CM | POA: Diagnosis not present

## 2021-05-30 DIAGNOSIS — J9601 Acute respiratory failure with hypoxia: Secondary | ICD-10-CM | POA: Diagnosis not present

## 2021-05-30 DIAGNOSIS — G40901 Epilepsy, unspecified, not intractable, with status epilepticus: Secondary | ICD-10-CM | POA: Diagnosis not present

## 2021-05-30 DIAGNOSIS — R739 Hyperglycemia, unspecified: Secondary | ICD-10-CM | POA: Diagnosis not present

## 2021-05-30 LAB — PHOSPHORUS
Phosphorus: 4.4 mg/dL (ref 2.5–4.6)
Phosphorus: 4.2 mg/dL (ref 2.5–4.6)

## 2021-05-30 LAB — CBC
HCT: 32.1 % — ABNORMAL LOW (ref 39.0–52.0)
Hemoglobin: 11.5 g/dL — ABNORMAL LOW (ref 13.0–17.0)
MCH: 32.1 pg (ref 26.0–34.0)
MCHC: 35.8 g/dL (ref 30.0–36.0)
MCV: 89.7 fL (ref 80.0–100.0)
Platelets: 127 10*3/uL — ABNORMAL LOW (ref 150–400)
RBC: 3.58 MIL/uL — ABNORMAL LOW (ref 4.22–5.81)
RDW: 14 % (ref 11.5–15.5)
WBC: 8.4 10*3/uL (ref 4.0–10.5)
nRBC: 0 % (ref 0.0–0.2)

## 2021-05-30 LAB — COMPREHENSIVE METABOLIC PANEL
ALT: 15 U/L (ref 0–44)
AST: 19 U/L (ref 15–41)
Albumin: 2.9 g/dL — ABNORMAL LOW (ref 3.5–5.0)
Alkaline Phosphatase: 47 U/L (ref 38–126)
Anion gap: 12 (ref 5–15)
BUN: 43 mg/dL — ABNORMAL HIGH (ref 8–23)
CO2: 22 mmol/L (ref 22–32)
Calcium: 7.6 mg/dL — ABNORMAL LOW (ref 8.9–10.3)
Chloride: 100 mmol/L (ref 98–111)
Creatinine, Ser: 7.47 mg/dL — ABNORMAL HIGH (ref 0.61–1.24)
GFR, Estimated: 7 mL/min — ABNORMAL LOW (ref >60)
Glucose, Bld: 251 mg/dL — ABNORMAL HIGH (ref 70–99)
Potassium: 3.8 mmol/L (ref 3.5–5.1)
Sodium: 134 mmol/L — ABNORMAL LOW (ref 135–145)
Total Bilirubin: 0.5 mg/dL (ref 0.3–1.2)
Total Protein: 6 g/dL — ABNORMAL LOW (ref 6.5–8.1)

## 2021-05-30 LAB — GLUCOSE, CAPILLARY
Glucose-Capillary: 202 mg/dL — ABNORMAL HIGH (ref 70–99)
Glucose-Capillary: 243 mg/dL — ABNORMAL HIGH (ref 70–99)
Glucose-Capillary: 297 mg/dL — ABNORMAL HIGH (ref 70–99)
Glucose-Capillary: 131 mg/dL — ABNORMAL HIGH (ref 70–99)
Glucose-Capillary: 70 mg/dL (ref 70–99)
Glucose-Capillary: 72 mg/dL (ref 70–99)

## 2021-05-30 LAB — MAGNESIUM
Magnesium: 2.2 mg/dL (ref 1.7–2.4)
Magnesium: 2.3 mg/dL (ref 1.7–2.4)

## 2021-05-30 IMAGING — MR MR HEAD W/O CM
16 of 17 series · 43 of 48 positions shown · non-contrast
Comparison: Head CT 2 days ago.  MRI [DATE].

CLINICAL DATA: Seizure.  Abnormal neurological exam.

EXAM:
MRI HEAD WITHOUT CONTRAST
TECHNIQUE: Multiplanar, multiecho pulse sequences of the brain and surrounding
structures were obtained without intravenous contrast.

[Series 5: DWI · axial · 3.0mm · 0.88mm/px · z∈[-64,+80]mm · 6 of 100 slices shown (1 of 4)]
[im 1/100]
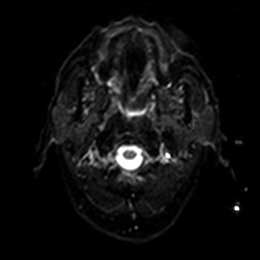
[im 20/100]
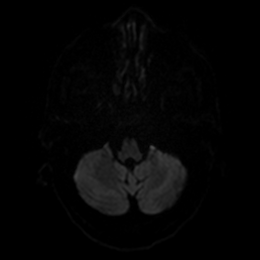
[im 40/100]
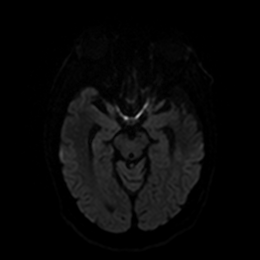
[im 60/100]
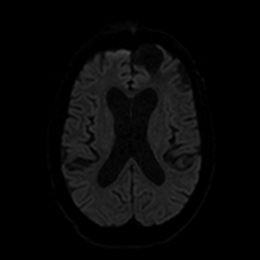
[im 80/100]
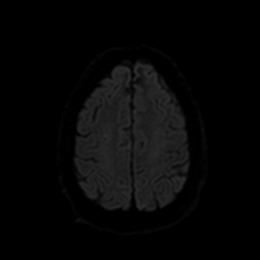
[im 100/100]
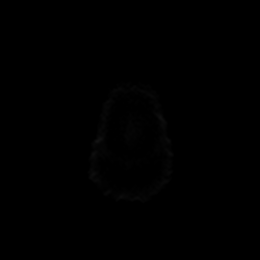

[Series 6: DWI · axial · 3.0mm · 0.88mm/px · z∈[-64,+80]mm · 2 of 50 slices shown (2 of 4)]
[im 1/50]
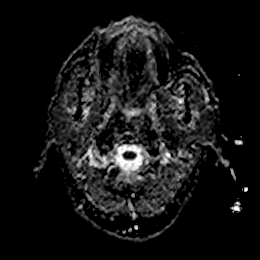
[im 50/50]
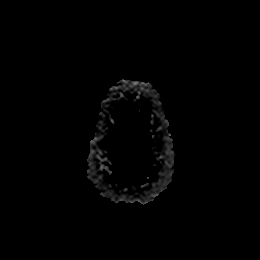

[Series 7: DWI · coronal · 4.0mm · 0.88mm/px · 3 of 70 slices shown (3 of 4)]
[im 1/70]
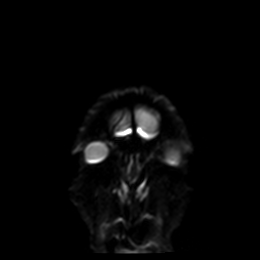
[im 35/70]
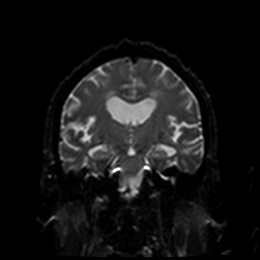
[im 70/70]
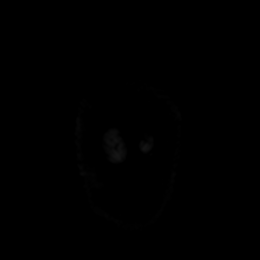

[Series 8: DWI · coronal · 4.0mm · 0.88mm/px · 2 of 35 slices shown (4 of 4)]
[im 1/35]
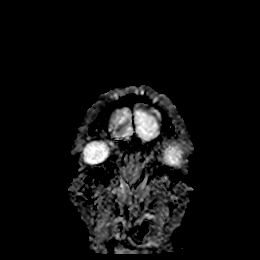
[im 35/35]
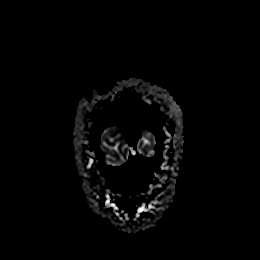

[Series 9: T1 · sagittal · 5.0mm · 0.75mm/px · 1 of 23 slices shown]
[im 1/23]
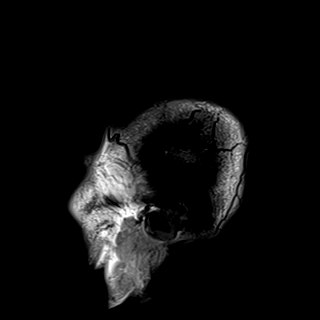

[Series 10: T2 · axial · 5.0mm · 0.72mm/px · 1 of 27 slices shown (1 of 3)]
[im 1/27]
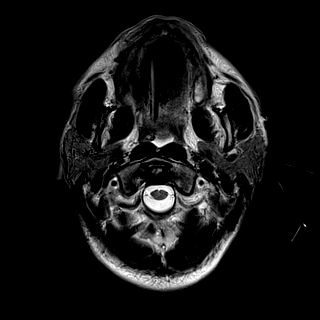

[Series 11: FLAIR · axial · 5.0mm · 0.45mm/px · 1 of 27 slices shown (1 of 2)]
[im 1/27]
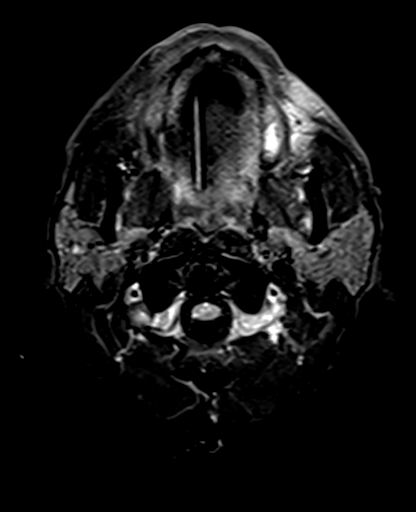

[Series 12: mag_images · axial · 3.0mm · 0.90mm/px · z∈[-81,+92]mm · 3 of 60 slices shown]
[im 1/60]
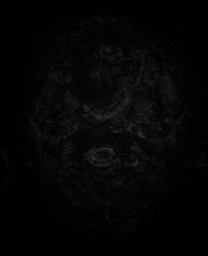
[im 30/60]
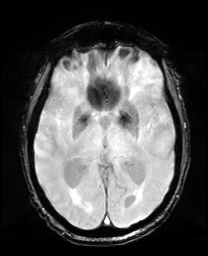
[im 60/60]
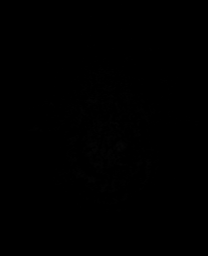

[Series 13: pha_images · axial · 3.0mm · 0.90mm/px · z∈[-81,+92]mm · 3 of 57 slices shown]
[im 1/57]
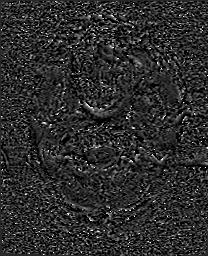
[im 29/57]
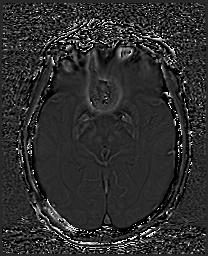
[im 57/57]
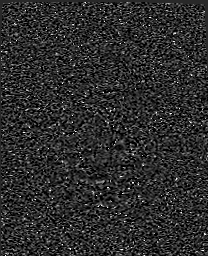

[Series 14: swi_images · axial · 3.0mm · 0.90mm/px · z∈[-81,+92]mm · 3 of 60 slices shown]
[im 1/60]
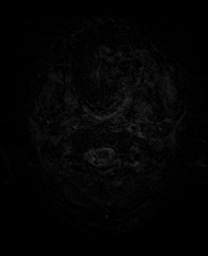
[im 30/60]
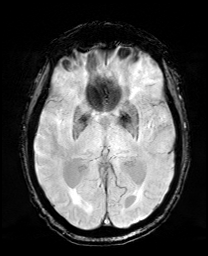
[im 60/60]
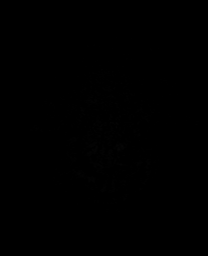

[Series 15: mip_images(sw) · axial · 24.0mm · 0.90mm/px · z∈[-71,+82]mm · 3 of 53 slices shown]
[im 1/53]
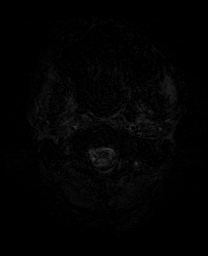
[im 27/53]
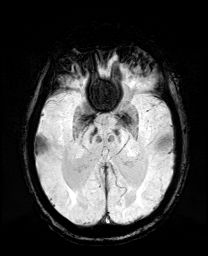
[im 53/53]
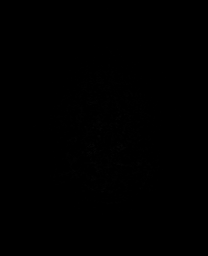

[Series 17: t1_mprage_tra_p2_iso · axial · 1.0mm · 0.98mm/px · z∈[-79,+92]mm · 8 of 176 slices shown]
[im 1/176]
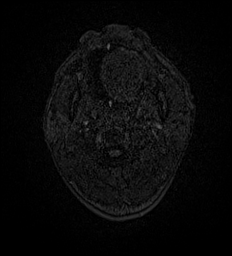
[im 26/176]
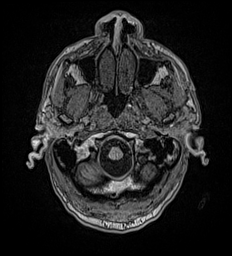
[im 51/176]
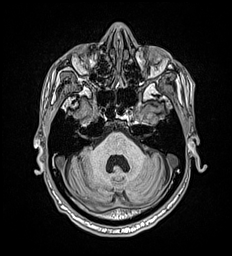
[im 76/176]
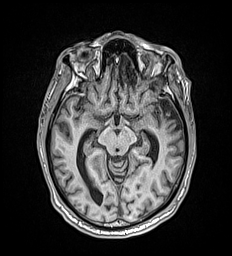
[im 101/176]
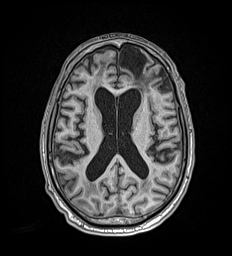
[im 126/176]
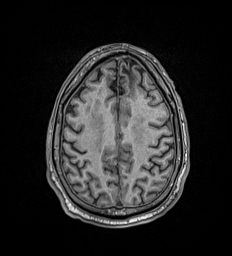
[im 151/176]
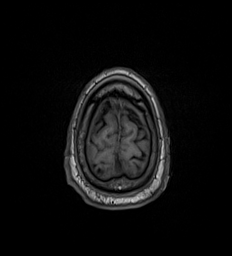
[im 176/176]
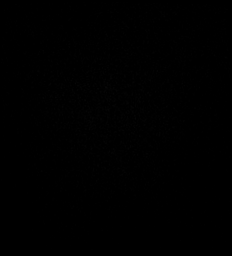

[Series 18: t1_mprage_tra_p2_iso_mpr_coronal · coronal · 1.0mm · 0.45mm/px · 4 of 120 slices shown]
[im 1/120]
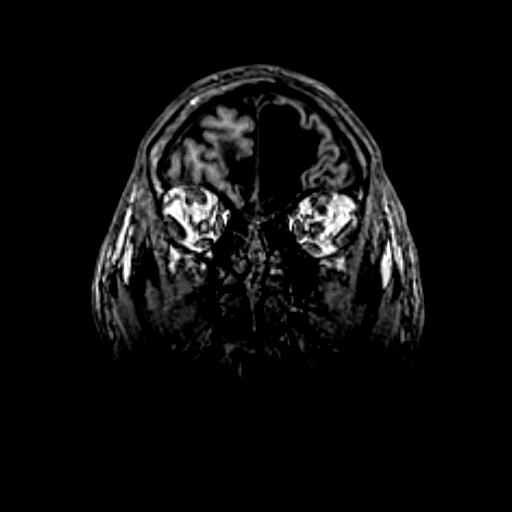
[im 24/120]
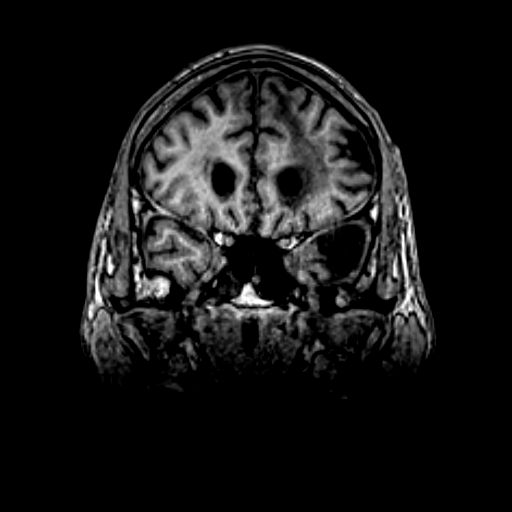
[im 48/120]
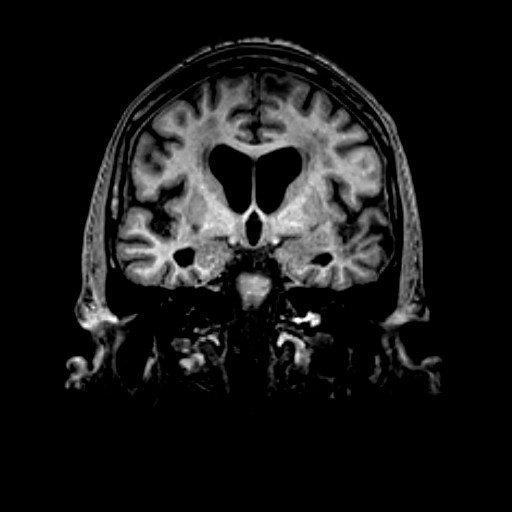
[im 72/120]
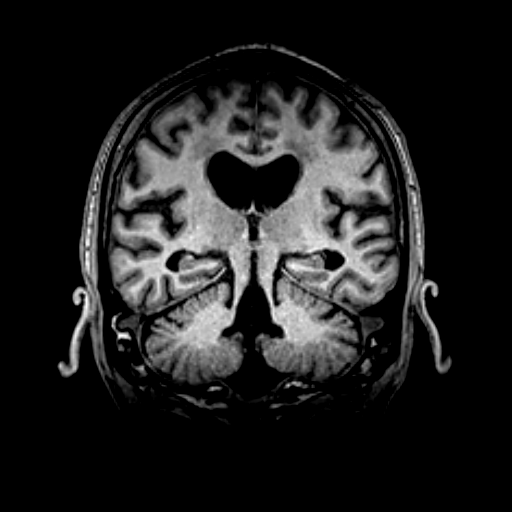

[Series 19: T2 · coronal · 3.0mm · 0.27mm/px · 1 of 29 slices shown (2 of 3)]
[im 1/29]
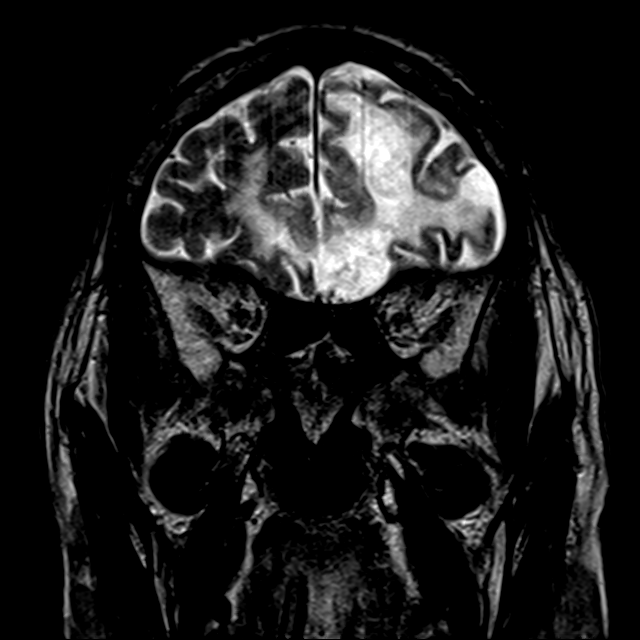

[Series 20: FLAIR · coronal · 3.0mm · 0.56mm/px · 1 of 29 slices shown (2 of 2)]
[im 1/29]
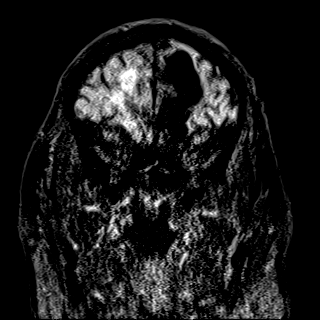

[Series 21: T2 · coronal · 5.0mm · 0.34mm/px · 1 of 31 slices shown (3 of 3)]
[im 1/31]
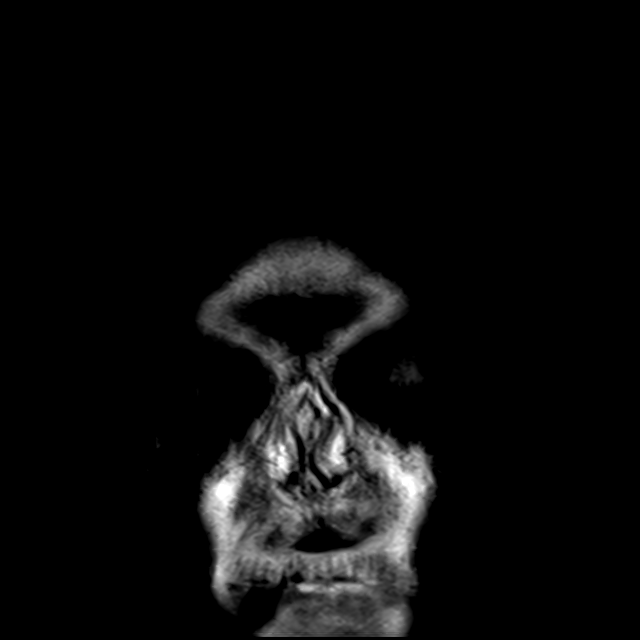

[43 of 48 positions shown; findings below may reference images not displayed]

FINDINGS: Brain: Diffusion imaging does not show any acute posterior fossa
finding. There are punctate acute infarctions in the right
parietooccipital junction and the left posterior temporoparietal
junction. There are a cluster of small acute infarctions affecting
the cortical and subcortical brain in both posterior frontal vertex
regions. No large or confluent infarction. Old post traumatic
findings including atrophy, encephalomalacia and gliosis in both
frontal lobes and at the left temporal tip. Chronic small-vessel
ischemic changes affect the cerebral hemispheric deep white matter.
No sign of acute intracranial hemorrhage, obstructive hydrocephalus
or extra-axial fluid collection. No mesial temporal lesion is
present.

Vascular: Major vessels at the base of the brain show flow.

Skull and upper cervical spine: Negative

Sinuses/Orbits: Clear/normal

Other: None
IMPRESSION: Scattered punctate acute infarctions most consistent with embolic
disease from the heart or ascending aorta. Punctate acute
infarctions are visible at the right parietooccipital junction, the
left temporoparietal junction, and at both posterior frontal vertex
cortical and subcortical regions.

Chronic post traumatic encephalomalacia, atrophy and gliosis
affecting both anterior frontal lobes and the left temporal tip.

## 2021-05-30 MED ORDER — HYDRALAZINE HCL 10 MG PO TABS
10.0000 mg | ORAL_TABLET | Freq: Three times a day (TID) | ORAL | Status: DC
  Administered 2021-05-30 – 2021-05-31 (×2): 10 mg
  Filled 2021-05-30 (×2): qty 1

## 2021-05-30 MED ORDER — DEXMEDETOMIDINE HCL IN NACL 400 MCG/100ML IV SOLN
0.4000 ug/kg/h | INTRAVENOUS | Status: DC
  Administered 2021-05-30: 0.4 ug/kg/h via INTRAVENOUS
  Filled 2021-05-30 (×3): qty 100

## 2021-05-30 MED ORDER — INSULIN ASPART 100 UNIT/ML IJ SOLN
4.0000 [IU] | INTRAMUSCULAR | Status: DC
  Administered 2021-05-30 – 2021-05-31 (×6): 4 [IU] via SUBCUTANEOUS

## 2021-05-30 NOTE — Progress Notes (Signed)
NAME:  Jonathan Kane, MRN:  [id number], DOB:  [birth year], LOS: 2 ADMISSION DATE:  05/28/2021, CONSULTATION DATE: 05/28/2021 REFERRING MD:  Dr. Laverta Baltimore, CHIEF COMPLAINT: Altered mental status  History of Present Illness:  75 yo male former smoker last seen normal on 05/27/21 at 9 am was found by neighbor on floor at 11 am on 05/28/21.  Found to have CBG > 600.  Was able to communicate with EMS and assist with getting on gurney.  En route to ED he developed Rt gaze preference and Lt side weakness.  Code stroke called.  He developed seizure and required intubation for airway protection.  Had SBP 229/127 in ER.    Pertinent  Medical History  DM type 2, ESRD on HD, ED, HTN, Vit D deficiency, Cocaine abuse, Hep C, HSV, DM retinopathy, BPH, ETOH, Secondary hyperparathyroidism  Significant Hospital Events: Including procedures, antibiotic start and stop dates in addition to other pertinent events   8/12 Admit, neurology and nephrology consulted, started on insulin gtt, EEG monitoring 8/13 LTM with EEG  Interim History / Subjective:  This morning awake and purposeful off propofol, hyperglycemic. No further seizures.   Objective   Blood pressure 110/75, pulse 98, temperature 99.4 F (37.4 C), temperature source Oral, resp. rate 17, height '5\' 10"'$  (1.778 m), weight 69.2 kg, SpO2 100 %.    Vent Mode: PRVC FiO2 (%):  [35 %-40 %] 35 % Set Rate:  [16 bmp] 16 bmp Vt Set:  [580 mL] 580 mL PEEP:  [5 cmH20] 5 cmH20 Pressure Support:  [8 cmH20] 8 cmH20 Plateau Pressure:  [15 cmH20-16 cmH20] 16 cmH20   Intake/Output Summary (Last 24 hours) at 05/30/2021 0912 Last data filed at 05/30/2021 0700 Gross per 24 hour  Intake 1394.05 ml  Output 1500 ml  Net -105.95 ml   Filed Weights   05/29/21 0910 05/29/21 1220 05/30/21 0500  Weight: 61.1 kg 60 kg 69.2 kg    Examination:  General - sedated, not responsive, on cEEG ENT - ETT in place Cardiac - RRR Chest - no wheezes or crackles Abdomen - soft,  nontender, +bs Extremities - av fistula Lt arm +thrill and bruit Neuro - RASS -1 to +1, pupils equal, moves all 4 extremities and trying to gag up tube and sit up  Na 134, Glucose 251, Cr 7.47, BUN 43, K 3.8  EEG overnight reviewed - no seizures  Resolved Hospital Problem list     Assessment & Plan:  Jonathan Kane is a 75 y.o. man with history of DM2 and ESRD who was found down in Dialysis clinic.  Presents with:  Acute metabolic encephalopathy with hyperglycemia, hypertensive emergency, and new onset seizure with status epilepticus. - discussed with neurology. No further seizures. Suspect hyperglycemia was the insult. Will continue current enteral AEDs, start tapering propofol, will start precedex for sedation instead - RASS goal 0 to -1 - should be discharged on AEDs per neurology for at least the short term given the extent of his seizures on presentation.   Acute hypoxemic respiratory failure - intubated due to clinical coma, status epilepticus - maintain LTVV, lung protective strategies - VAP bundle ordered - will proceed with SAT/SBT - consider extubation Monday   DM type 2 poorly controlled with hyperglycemia. - insulin gtt now off - increase tube feed coverage, continue long acting insulin  ESRD on HD. - nephrology consulted, usual day is MWF.  - had HD Saturday - appears euvolemic   HTN emergency Hx of HLD. - emergency  improved - goal SBP < 185, DBP < 105 - prn IV labetalol - continue aspirin, lipitor, diltiazem, lisinopril, hydralazine  Best Practice (right click and "Reselect all SmartList Selections" daily)   Diet/type: NPO w/ meds via tube DVT prophylaxis: prophylactic heparin  GI prophylaxis: PPI Lines: Central line Foley:  N/A Code Status:  full code Last date of multidisciplinary goals of care: updated Clarene Critchley over the phone 8/13.   Labs   CBC: Recent Labs  Lab 05/28/21 1157 05/28/21 1206 05/28/21 1318 05/28/21 1346 05/29/21 0539  05/30/21 0445  WBC 8.0  --   --   --  8.4 8.4  NEUTROABS 7.1  --   --   --   --   --   HGB 11.9* 12.6* 11.9* 12.9* 10.7* 11.5*  HCT 35.2* 37.0* 35.0* 38.0* 30.8* 32.1*  MCV 91.7  --   --   --  89.5 89.7  PLT 146*  --   --   --  130* 127*    Basic Metabolic Panel: Recent Labs  Lab 05/28/21 1157 05/28/21 1206 05/28/21 1318 05/28/21 1346 05/29/21 0539 05/29/21 1326 05/29/21 1710 05/30/21 0445  NA 129* 130* 130* 132* 139  --   --  134*  K 4.7 4.8 3.9 4.1 3.5  --   --  3.8  CL 92* 96*  --   --  102  --   --  100  CO2 21*  --   --   --  23  --   --  22  GLUCOSE 777* >700*  --   --  143*  --   --  251*  BUN 58* 72*  --   --  64*  --   --  43*  CREATININE 9.02* 9.60*  --   --  10.04*  --   --  7.47*  CALCIUM 8.1*  --   --   --  8.4*  --   --  7.6*  MG  --   --   --   --   --  2.0 2.0 2.2  PHOS  --   --   --   --   --  2.8 3.2 4.4   GFR: Estimated Creatinine Clearance: 8.6 mL/min (A) (by C-G formula based on SCr of 7.47 mg/dL (H)). Recent Labs  Lab 05/28/21 1157 05/29/21 0539 05/30/21 0445  WBC 8.0 8.4 8.4    Liver Function Tests: Recent Labs  Lab 05/28/21 1157 05/30/21 0445  AST 36 19  ALT 20 15  ALKPHOS 69 47  BILITOT 0.8 0.5  PROT 7.4 6.0*  ALBUMIN 4.1 2.9*   No results for input(s): LIPASE, AMYLASE in the last 168 hours. No results for input(s): AMMONIA in the last 168 hours.  ABG    Component Value Date/Time   PHART 7.394 05/28/2021 1318   PCO2ART 38.7 05/28/2021 1318   PO2ART 428 (H) 05/28/2021 1318   HCO3 20.4 05/28/2021 1346   TCO2 21 (L) 05/28/2021 1346   ACIDBASEDEF 4.0 (H) 05/28/2021 1346   O2SAT 95.0 05/28/2021 1346     Coagulation Profile: Recent Labs  Lab 05/28/21 1157  INR 1.1    Cardiac Enzymes: No results for input(s): CKTOTAL, CKMB, CKMBINDEX, TROPONINI in the last 168 hours.  HbA1C: Hgb A1c MFr Bld  Date/Time Value Ref Range Status  05/29/2021 10:16 AM 11.9 (H) 4.8 - 5.6 % Final    Comment:    (NOTE) Pre diabetes:  5.7%-6.4%  Diabetes:              >6.4%  Glycemic control for   <7.0% adults with diabetes   11/07/2019 07:17 AM 10.1 (H) 4.8 - 5.6 % Final    Comment:    (NOTE) Pre diabetes:          5.7%-6.4% Diabetes:              >6.4% Glycemic control for   <7.0% adults with diabetes     CBG: Recent Labs  Lab 05/29/21 1509 05/29/21 1947 05/29/21 2307 05/30/21 0310 05/30/21 0703  GLUCAP 209* 277* 146* 202* 243*     Critical care time: 34 minutes   The patient is critically ill due to status epilepticus, respiratory failure, hyperglycemia, encephalopathy.  Critical care was necessary to treat or prevent imminent or life-threatening deterioration.  Critical care was time spent personally by me on the following activities: development of treatment plan with patient and/or surrogate as well as nursing, discussions with consultants, evaluation of patient's response to treatment, examination of patient, obtaining history from patient or surrogate, ordering and performing treatments and interventions, ordering and review of laboratory studies, ordering and review of radiographic studies, pulse oximetry, re-evaluation of patient's condition and participation in multidisciplinary rounds.   Critical Care Time devoted to patient care services described in this note is 34 minutes. This time reflects time of care of this [facility] . This critical care time does not reflect separately billable procedures or procedure time, teaching time or supervisory time of PA/NP/Med student/Med Resident etc but could involve care discussion time.       Spero Geralds [facility] 05/30/2021 9:12 AM  Pager: see AMION  If no response to pager , please call critical care on call (see AMION) until 7pm After 7:00 pm call Elink

## 2021-05-30 NOTE — Progress Notes (Signed)
[facility] Progress Note  Subjective: seejn in ICU, on the vent  Vitals:   05/30/21 1800 05/30/21 1830 05/30/21 1938 05/30/21 2000  BP: 101/73 111/79 (!) 89/65 114/73  Pulse: 81 80 88 82  Resp: _0 Temp:    98.8 F (37.1 C)  TempSrc:    Oral  SpO2: 100% 100% 100% 100%  Weight:      Height:        Exam:  on vent ,sedated  no jvd  throat ett in place  Chest cta bilat and lat  Cor reg no RG  Abd soft ntnd no ascites   Ext no LE edema   Neuro on vent and sedated, not following commands   LFA AVF+bruit/  R IJ TDC        Home meds include norvasc, asa, lipitor, diltiazem, glipizide, hydralazine, insulin aspart, lisinopril, renvela 3 ac tid, sod bicarb, tradjenta , tresiba insulin, prns        OP HD: [place] MWF      400/ 1.5  70.5kg   2/2.5      Assessment/ Plan: AMS - w/ uncont diabetes (BS > 600), HTN emergency and new seizures.  Getting IV insulin and BP meds. Neuro consulting.  ESRD - on HD MWF. Had HD Sat off schedule, missed Friday. Next HD Monday.  VDRF - per CCM DM2/ hyperglycemia - sp insulin gtt, back on SQ insulin now.  HTN urgency - on 3 BP lowering meds per NG. BP's soft, will dc acei and cont cardizem and lower hydralazine dose.  Volume - doesn't look vol overloaded. Could not reach UF goal due to large BP drop on HD Sat. At dry wt, no vol excess on exam. Keep even w/ HD tomorrow.        Rob Raychell Holcomb 05/30/2021, 8:14 PM   Recent Labs  Lab 05/29/21 0539 05/29/21 1326 05/30/21 0445 05/30/21 1754  K 3.5  --  3.8  --   BUN 64*  --  43*  --   CREATININE 10.04*  --  7.47*  --   CALCIUM 8.4*  --  7.6*  --   PHOS  --    < > 4.4 4.2  HGB 10.7*  --  11.5*  --    < > = values in this interval not displayed.    Inpatient medications:  aspirin  81 mg Per Tube Daily   atorvastatin  40 mg Per Tube QHS   chlorhexidine gluconate (MEDLINE KIT)  15 mL Mouth Rinse BID   Chlorhexidine Gluconate Cloth  6 each Topical Daily    cholecalciferol  1,000 Units Per Tube Daily   diltiazem  60 mg Per Tube Q8H   docusate  100 mg Per Tube BID   feeding supplement (PROSource TF)  45 mL Per Tube BID   heparin injection (subcutaneous)  5,000 Units Subcutaneous Q8H   hydrALAZINE  25 mg Per Tube Q8H   insulin aspart  0-15 Units Subcutaneous Q4H   insulin aspart  4 Units Subcutaneous Q4H   insulin glargine-yfgn  32 Units Subcutaneous Daily   latanoprost  1 drop Both Eyes QHS   levETIRAcetam  500 mg Per Tube BID   lisinopril  5 mg Per Tube Daily   LORazepam  2 mg Intravenous Once   mouth rinse  15 mL Mouth Rinse 10 times per day   mupirocin ointment  1 application Nasal BID   pantoprazole sodium  40 mg Per Tube Q24H  polyethylene glycol  17 g Per Tube Daily   sodium chloride flush  10-40 mL Intracatheter Q12H    sodium chloride Stopped (05/29/21 0714)   dexmedetomidine (PRECEDEX) IV infusion 0.6 mcg/kg/hr (05/30/21 1806)   feeding supplement (VITAL AF 1.2 CAL) 55 mL/hr at 05/29/21 2149   propofol (DIPRIVAN) infusion Stopped (05/30/21 1103)   acetaminophen, fentaNYL (SUBLIMAZE) injection, labetalol, midazolam, sodium chloride flush

## 2021-05-30 NOTE — Procedures (Addendum)
Patient Name: MAHAD CRILLY  MRN: [id number]  Epilepsy Attending: Lora Havens  Referring Physician/Provider: Anibal Henderson, NP Duration: 05/29/2021 1311 to  05/30/2021 0902   Patient history:  75 y.o. male who was found down in the bathroom at his dialysis clinic, confused who had new onset status epilepticus in the setting of severe hyperglycemia en route to the ED. EEG to evaluate for seizure   Level of alertness: Comatose   AEDs during EEG study:  propofol, LEV   Technical aspects: This EEG study was done with scalp electrodes positioned according to the 10-20 International system of electrode placement. Electrical activity was acquired at a sampling rate of '500Hz'$  and reviewed with a high frequency filter of '70Hz'$  and a low frequency filter of '1Hz'$ . EEG data were recorded continuously and digitally stored.    Description: EEG showed continuous generalized polymorphic sharply contoured 3-'6hz'$  theta-delta slowing admixed with intermittent 15-'18hz'$  generalized beta activity.   ABNORMALITY -Continuous slow, generalized    IMPRESSION: This study is suggestive of severe diffuse encephalopathy, nonspecific etiology but likely related to sedation.  No seizures or definite epileptiform discharges were seen throughout the recording.   Tytianna Greenley [name]

## 2021-05-30 NOTE — Progress Notes (Signed)
Neurology Progress Note  Brief HPI: 75 y.o. male with PMHx of ESRD on hemodialysis, DM2, and HTN who was found down by neighbors with AMS and unable to speak with medic with a blood glucose that read "high". En route, he developed a leftward lead, right gaze, rightward head turn, flaccid RUE, and generalized myoclonic twitching with intermittent left arm rising. Initially given '4mg'$  Ativan and 1,000 mg Keppra for seizure control and intubated for airway protection.   Subjective: No acute overnight events LTM EEG without evidence of seizures overnight  Exam: Vitals:   05/30/21 0736 05/30/21 0845  BP:    Pulse:    Resp:    Temp: 99.4 F (37.4 C)   SpO2:  100%   Gen: Intubated and sedated in the ICU Resp: ETT with respirations assisted via mechanical ventilation, does spontaneously initiate respirations over set ventilator rate Abd: soft, non-distended  Neuro: Sedation paused for examination Mental Status: Intubated with sedation paused for examination.  He opens eyes to loud voice but does not follow commands Unable to assess speech or orientation at this time Does not fixate or track examiner Quickly becomes restless with sedation paused Cranial Nerves: Right pupil 4 mm, left pupil 3 mm briskly reactive to light, gaze is midline, corneal reflexes are not intact, he does not fixate or track examiner, unable to assess patient's hearing due to patient condition- does not open eyes or follow commands, cough and gag reflexes are intact, head appears grossly midline. Motor: Restless movements noted throughout each extremity shortly after sedation paused. Initially does not withdraw to noxious stimuli but later withdraws minimally throughout with noxious stimuli. There is no asymmetry noted. Does not follow commands. Tone and bulk are normal.  Sensory: Grimaces with application of noxious stimuli throughout Gait: Deferred  Pertinent Labs: CBC    Component Value Date/Time   WBC 8.4  05/30/2021 0445   RBC 3.58 (L) 05/30/2021 0445   HGB 11.5 (L) 05/30/2021 0445   HCT 32.1 (L) 05/30/2021 0445   PLT 127 (L) 05/30/2021 0445   MCV 89.7 05/30/2021 0445   MCH 32.1 05/30/2021 0445   MCHC 35.8 05/30/2021 0445   RDW 14.0 05/30/2021 0445   LYMPHSABS 0.4 (L) 05/28/2021 1157   MONOABS 0.4 05/28/2021 1157   EOSABS 0.0 05/28/2021 1157   BASOSABS 0.1 05/28/2021 1157   CMP     Component Value Date/Time   NA 134 (L) 05/30/2021 0445   K 3.8 05/30/2021 0445   CL 100 05/30/2021 0445   CO2 22 05/30/2021 0445   GLUCOSE 251 (H) 05/30/2021 0445   BUN 43 (H) 05/30/2021 0445   CREATININE 7.47 (H) 05/30/2021 0445   CALCIUM 7.6 (L) 05/30/2021 0445   PROT 6.0 (L) 05/30/2021 0445   ALBUMIN 2.9 (L) 05/30/2021 0445   AST 19 05/30/2021 0445   ALT 15 05/30/2021 0445   ALKPHOS 47 05/30/2021 0445   BILITOT 0.5 05/30/2021 0445   GFRNONAA 7 (L) 05/30/2021 0445   GFRAA 9 (L) 11/08/2019 0411   Urinalysis    Component Value Date/Time   COLORURINE YELLOW 11/07/2019 0818   APPEARANCEUR HAZY (A) 11/07/2019 0818   LABSPEC 1.011 11/07/2019 0818   PHURINE 5.0 11/07/2019 0818   GLUCOSEU >=500 (A) 11/07/2019 0818   HGBUR SMALL (A) 11/07/2019 0818   BILIRUBINUR NEGATIVE 11/07/2019 0818   KETONESUR NEGATIVE 11/07/2019 0818   PROTEINUR >=300 (A) 11/07/2019 0818   NITRITE NEGATIVE 11/07/2019 0818   LEUKOCYTESUR NEGATIVE 11/07/2019 0818    Imaging Reviewed: CT-scan of  the brain 05/28/2021: Significantly motion degraded. There is no acute intracranial hemorrhage or definite evidence of acute infarction. ASPECT score is 10.   DG Chest 05/28/2021: 1. New left internal jugular central venous catheter with tip overlying the mid distal SVC. No pneumothorax. 2. New enteric tube with tip in the stomach, side port is in the region of the gastroesophageal junction. Recommend advancement of at least 3 cm for optimal placement. 3. Other support apparatus unchanged. 4. Improving left basilar  atelectasis.  Overnight EEG 05/29/2021 - 05/30/2021: "This study is suggestive of severe diffuse encephalopathy, nonspecific etiology but likely related to sedation.  No seizures or definite epileptiform discharges were seen throughout the recording."   Overnight EEG 05/28/2021 - 05/29/2021: "This study is suggestive of cortical dysfunction in right hemisphere likely secondary underlying structural abnormality, postictal state.  There is also moderate to severe diffuse encephalopathy, nonspecific etiology but likely related to sedation.  No seizures or definite epileptiform discharges were seen throughout the recording."   Routine EEG 05/28/2021: "This study showed sharply contoured rhythmic slowing in bilateral left m>right frontal region.  This EEG pattern is on the ictal-interictal continuum with low potential for seizures. There is also cortical dysfunction in right hemisphere likely secondary underlying structural abnormality, postictal state.  There is also moderate to severe diffuse encephalopathy, nonspecific etiology but likely related to sedation.  No seizures or definite epileptiform discharges were seen throughout the recording.  Assessment: 75 year old man with past medical history of ESRD on hemodialysis, DM, HTN brought in with AMS at home with sugars too high to read on EMS glucometers. En route with EMS patient developed R gaze, frothing at the mouth, right-sided weakness followed by abnormal posturing of the whole body concerning for generalized tonic-clonic seizure.  Remained in seizure for a prolonged period of time requiring emergent intubation and treatment with benzodiazepines and antiepileptics for the presumed status epilepticus. - Examination reveals patient with restless movements with sedation paused, he remains intubated, does not follow commands, and does not fixate or track with eye movements.  - EEG with generalized and right-sided slowing concerning for structural abnormality  versus postictal state.  Also reveals moderate diffuse encephalopathy. Overnight EEG for 2 consecutive nights are without seizures or definite epileptiform discharges but with severe diffuse encephalopathy. Plan reducing sedation - Presentation most consistent with status epilepticus in the setting of severe hyperglycemia. Due to the presentation and duration of the seizures, would be hesitant to discontinue Keppra at this time despite EEG without evidence of ongoing seizures for 48 hours and seizure etiology expected to be provoked 2/2 severe hyperglycemia.   Impression:  New onset status epilepticus Severe hyperglycemia likely provoking the seizures and status epilepticus ESRD on hemodialysis   Recommendations: - Discontinue LTM EEG monitoring - Continue Keppra. Initially was loaded with PHT, for concern of status but was never started on phenytoin maintenance dose.  EEG did not reveal ongoing seizure activity hence phenytoin maintenance was not added. - Continue seizure precautions - Ativan '2mg'$  IV PRN seizure activity lasting > 5 minutes and notify neurology  - Discussed care with PCCM MD at bedside with plan to wean sedation and continue to closely monitor patient - will start Precedex and stop propofol. - MRI brain once leads are off.   Anibal Henderson, AGACNP-BC [facility] [phone]    Attending Neurohospitalist Addendum Patient seen and examined with APP/Resident. Agree with the history and physical as documented above. Agree with the plan as documented, which I helped formulate. I have independently  reviewed the chart, obtained history, review of systems and examined the patient.I have personally reviewed pertinent head/neck/spine imaging (CT/MRI).  Plan relayed to Dr. Shearon Stalls Please feel free to call with any questions.   -- Amie Portland, MD Neurologist [facility] Pager: [phone]   CRITICAL CARE ATTESTATION Performed by: Amie Portland,  MD Total critical care time: 30 minutes Critical care time was exclusive of separately billable procedures and treating other patients and/or supervising APPs/Residents/Students Critical care was necessary to treat or prevent imminent or life-threatening deterioration due to status epilepticus This patient is critically ill and at significant risk for neurological worsening and/or death and care requires constant monitoring. Critical care was time spent personally by me on the following activities: development of treatment plan with patient and/or surrogate as well as nursing, discussions with consultants, evaluation of patient's response to treatment, examination of patient, obtaining history from patient or surrogate, ordering and performing treatments and interventions, ordering and review of laboratory studies, ordering and review of radiographic studies, pulse oximetry, re-evaluation of patient's condition, participation in multidisciplinary rounds and medical decision making of high complexity in the care of this patient.

## 2021-05-30 NOTE — Progress Notes (Signed)
LTM discontinued; no skin breakdown was seen. [facility] notified.

## 2021-05-31 DIAGNOSIS — E1310 Other specified diabetes mellitus with ketoacidosis without coma: Secondary | ICD-10-CM | POA: Diagnosis not present

## 2021-05-31 DIAGNOSIS — G40901 Epilepsy, unspecified, not intractable, with status epilepticus: Secondary | ICD-10-CM | POA: Diagnosis not present

## 2021-05-31 DIAGNOSIS — J9601 Acute respiratory failure with hypoxia: Secondary | ICD-10-CM | POA: Diagnosis not present

## 2021-05-31 LAB — CBC WITH DIFFERENTIAL/PLATELET
Abs Immature Granulocytes: 0.04 10*3/uL (ref 0.00–0.07)
Basophils Absolute: 0 10*3/uL (ref 0.0–0.1)
Basophils Relative: 0 %
Eosinophils Absolute: 0.4 10*3/uL (ref 0.0–0.5)
Eosinophils Relative: 5 %
HCT: 32 % — ABNORMAL LOW (ref 39.0–52.0)
Hemoglobin: 10.8 g/dL — ABNORMAL LOW (ref 13.0–17.0)
Immature Granulocytes: 1 %
Lymphocytes Relative: 12 %
Lymphs Abs: 0.9 10*3/uL (ref 0.7–4.0)
MCH: 30.9 pg (ref 26.0–34.0)
MCHC: 33.8 g/dL (ref 30.0–36.0)
MCV: 91.7 fL (ref 80.0–100.0)
Monocytes Absolute: 1 10*3/uL (ref 0.1–1.0)
Monocytes Relative: 12 %
Neutro Abs: 5.6 10*3/uL (ref 1.7–7.7)
Neutrophils Relative %: 70 %
Platelets: 134 10*3/uL — ABNORMAL LOW (ref 150–400)
RBC: 3.49 MIL/uL — ABNORMAL LOW (ref 4.22–5.81)
RDW: 14.3 % (ref 11.5–15.5)
WBC: 7.9 10*3/uL (ref 4.0–10.5)
nRBC: 0 % (ref 0.0–0.2)

## 2021-05-31 LAB — LIPID PANEL
Cholesterol: 79 mg/dL (ref 0–200)
HDL: 39 mg/dL — ABNORMAL LOW (ref >40)
LDL Cholesterol: 14 mg/dL (ref 0–99)
Total CHOL/HDL Ratio: 2 RATIO
Triglycerides: 132 mg/dL (ref <150)
VLDL: 26 mg/dL (ref 0–40)

## 2021-05-31 LAB — RENAL FUNCTION PANEL
Albumin: 2.7 g/dL — ABNORMAL LOW (ref 3.5–5.0)
Anion gap: 14 (ref 5–15)
BUN: 64 mg/dL — ABNORMAL HIGH (ref 8–23)
CO2: 21 mmol/L — ABNORMAL LOW (ref 22–32)
Calcium: 7.7 mg/dL — ABNORMAL LOW (ref 8.9–10.3)
Chloride: 100 mmol/L (ref 98–111)
Creatinine, Ser: 8.88 mg/dL — ABNORMAL HIGH (ref 0.61–1.24)
GFR, Estimated: 6 mL/min — ABNORMAL LOW (ref >60)
Glucose, Bld: 244 mg/dL — ABNORMAL HIGH (ref 70–99)
Phosphorus: 4.3 mg/dL (ref 2.5–4.6)
Potassium: 3.8 mmol/L (ref 3.5–5.1)
Sodium: 135 mmol/L (ref 135–145)

## 2021-05-31 LAB — GLUCOSE, CAPILLARY
Glucose-Capillary: 100 mg/dL — ABNORMAL HIGH (ref 70–99)
Glucose-Capillary: 101 mg/dL — ABNORMAL HIGH (ref 70–99)
Glucose-Capillary: 115 mg/dL — ABNORMAL HIGH (ref 70–99)
Glucose-Capillary: 117 mg/dL — ABNORMAL HIGH (ref 70–99)
Glucose-Capillary: 142 mg/dL — ABNORMAL HIGH (ref 70–99)
Glucose-Capillary: 233 mg/dL — ABNORMAL HIGH (ref 70–99)
Glucose-Capillary: 234 mg/dL — ABNORMAL HIGH (ref 70–99)
Glucose-Capillary: >600 mg/dL (ref 70–99)

## 2021-05-31 MED ORDER — STROKE: EARLY STAGES OF RECOVERY BOOK
Freq: Once | Status: AC
  Filled 2021-05-31: qty 1

## 2021-05-31 MED ORDER — LIDOCAINE 4 % EX CREA
TOPICAL_CREAM | Freq: Two times a day (BID) | CUTANEOUS | Status: DC | PRN
  Filled 2021-05-31: qty 5

## 2021-05-31 MED ORDER — HYDRALAZINE HCL 50 MG PO TABS
25.0000 mg | ORAL_TABLET | Freq: Three times a day (TID) | ORAL | Status: DC
  Filled 2021-05-31: qty 1

## 2021-05-31 MED ORDER — LEVETIRACETAM IN NACL 500 MG/100ML IV SOLN
500.0000 mg | Freq: Once | INTRAVENOUS | Status: AC
  Administered 2021-05-31: 500 mg via INTRAVENOUS
  Filled 2021-05-31: qty 100

## 2021-05-31 MED ORDER — AMLODIPINE BESYLATE 5 MG PO TABS
5.0000 mg | ORAL_TABLET | Freq: Every day | ORAL | Status: DC
  Administered 2021-06-01 – 2021-06-08 (×8): 5 mg via ORAL
  Filled 2021-05-31 (×8): qty 1

## 2021-05-31 MED ORDER — LEVETIRACETAM IN NACL 500 MG/100ML IV SOLN
500.0000 mg | Freq: Two times a day (BID) | INTRAVENOUS | Status: DC
  Administered 2021-05-31 – 2021-06-01 (×2): 500 mg via INTRAVENOUS
  Filled 2021-05-31 (×2): qty 100

## 2021-05-31 NOTE — Plan of Care (Signed)
  Problem: Clinical Measurements: Goal: Ability to maintain clinical measurements within normal limits will improve Outcome: Progressing   Problem: Clinical Measurements: Goal: Ability to maintain clinical measurements within normal limits will improve Outcome: Progressing Goal: Respiratory complications will improve Outcome: Progressing   Problem: Safety: Goal: Non-violent Restraint(s) Outcome: Progressing

## 2021-05-31 NOTE — Progress Notes (Addendum)
NAME:  Jonathan Kane, MRN:  [id number], DOB:  [birth year], LOS: 3 ADMISSION DATE:  05/28/2021, CONSULTATION DATE: 05/28/2021 REFERRING MD:  Dr. Laverta Baltimore, CHIEF COMPLAINT: Altered mental status  History of Present Illness:  75 yo male former smoker last seen normal on 05/27/21 at 9 am was found by neighbor on floor at 11 am on 05/28/21.  Found to have CBG > 600.  Was able to communicate with EMS and assist with getting on gurney.  En route to ED he developed Rt gaze preference and Lt side weakness.  Code stroke called.  He developed seizure and required intubation for airway protection.  Had SBP 229/127 in ER.    Pertinent  Medical History  DM type 2, ESRD on HD, ED, HTN, Vit D deficiency, Cocaine abuse, Hep C, HSV, DM retinopathy, BPH, ETOH, Secondary hyperparathyroidism  Significant Hospital Events: Including procedures, antibiotic start and stop dates in addition to other pertinent events   8/12 Admit, neurology and nephrology consulted, started on insulin gtt, EEG monitoring 8/13 LTM with EEG 8/14: Weaned off Propofol, MRI with new punctate stroke over multiple territories  8/15: Extubated   Interim History / Subjective:  O/N: No issues overnight  Patient awake and aware, indicating that he wants to have his tube removed. After extubation, patient states he is doing better.   Objective   Blood pressure 140/85, pulse 99, temperature 99.4 F (37.4 C), temperature source Oral, resp. rate 20, height '5\' 10"'$  (1.778 m), weight 71 kg, SpO2 98 %.    Vent Mode: PSV;CPAP FiO2 (%):  [35 %-100 %] 36 % Set Rate:  [16 bmp] 16 bmp Vt Set:  [580 mL] 580 mL PEEP:  [5 cmH20] 5 cmH20 Pressure Support:  [8 cmH20] 8 cmH20 Plateau Pressure:  [16 cmH20-17 cmH20] 16 cmH20   Intake/Output Summary (Last 24 hours) at 05/31/2021 1203 Last data filed at 05/31/2021 0800 Gross per 24 hour  Intake 869 ml  Output 150 ml  Net 719 ml   Filed Weights   05/29/21 1220 05/30/21 0500 05/31/21 0415  Weight: 60 kg 69.2  kg 71 kg    Examination: Physical Exam Constitutional:      Appearance: He is ill-appearing.     Comments: Intubated, no acute distress, following commands with head shakes and nods.   Cardiovascular:     Rate and Rhythm: Normal rate and regular rhythm.     Pulses: Normal pulses.     Heart sounds: Normal heart sounds. No murmur heard.   No friction rub. No gallop.  Pulmonary:     Effort: No respiratory distress.     Breath sounds: Normal breath sounds. No wheezing or rales.     Comments: Intubated, NAD Abdominal:     General: Abdomen is flat. Bowel sounds are normal.     Palpations: Abdomen is soft.     Tenderness: There is no abdominal tenderness.  Musculoskeletal:     Right lower leg: No edema.     Left lower leg: No edema.  Skin:    General: Skin is warm.  Neurological:     Mental Status: He is alert.     Resolved Hospital Problem list   Acute Hypoxemic Respiratory Failure  Assessment & Plan:  MALIQUE DEBELLIS is a 75 y.o. man with history of DM2 and ESRD who was found down in Dialysis clinic.  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 He is taking ASA daily, on high intensity statin. On chart review, it does not appear that Mr. Hergott has a Hx of A. Fib, but is on diltiazem at home. Will likely need a Holter monitor in the OP setting.  - Follow up Neurology recommendations continue ASA, continue tele, Continue blood sugar control, A1c of 11.9, lipid panel (already on lipitor 40 mg).  - Continuing Keppra 500 mg BID - PRN Ativan '2mg'$  IVC for seizure activity  - To be discharged on AEDs per  neurology for at least the short term given the extent of his seizures on presentation.   Acute Hypoxemic Respiratory Failure: - Intubated due to clinical coma, status epilepticus, extubated 8/15.  - His RSBI of 46 after 30 minute trial indicates that he will be a successful extubation. Will extubate today and monitor.   DM type 2 poorly controlled with hyperglycemia: Sugars continue to be elevated, extubated today will DC TF coverage.  - Continue Glargine 32 Units - Moderate SSI - Goal of 140-180 - DC TF coverage  ESRD on HD: Usual days are MWF. If tolerates HD today, will be stable for the floor.  - Appreciate nephrology's assistance - Dialysis today - appears euvolemic   HTN emergency Hx of HLD: SBP 111-141 ON,  pressures well controlled.  - Goal SBP < 185, DBP < 105 - PRN IV labetalol - Increase Hydralazine to 25 mg TID - DC Lisinopril - DC Diltiazem  - Start home amlodipine 5 mg QD - Continue aspirin 81 mg QD, Lipitor 40 mg QD  Best Practice (right click and "Reselect all SmartList Selections" daily)   Diet/type: NPO w/ meds via tube DVT prophylaxis: prophylactic heparin  GI prophylaxis: PPI Lines: Central line Foley:  N/A Code Status:  full code Last date of multidisciplinary goals of care: updated Clarene Critchley over the phone 8/13.   Labs   CBC: Recent Labs  Lab 05/28/21 1157 05/28/21 1206 05/28/21 1318 05/28/21 1346 05/29/21 0539 05/30/21 0445 05/31/21 0416  WBC 8.0  --   --   --  8.4 8.4 7.9  NEUTROABS 7.1  --   --   --   --   --  5.6  HGB 11.9*   < > 11.9* 12.9* 10.7* 11.5* 10.8*  HCT 35.2*   < > 35.0* 38.0* 30.8* 32.1* 32.0*  MCV 91.7  --   --   --  89.5 89.7 91.7  PLT 146*  --   --   --  130* 127* 134*   < > = values in this interval not displayed.   Basic Metabolic Panel: Recent Labs  Lab 05/28/21 1157 05/28/21 1206 05/28/21 1318 05/28/21 1346 05/29/21 0539 05/29/21 1326 05/29/21 1710 05/30/21 0445 05/30/21 1754 05/31/21 0416  NA 129*  130* 130* 132* 139  --   --  134*  --  135  K 4.7 4.8 3.9 4.1 3.5  --   --  3.8  --  3.8  CL 92* 96*  --   --  102  --   --  100  --  100  CO2 21*  --   --   --  23  --   --  22  --  21*  GLUCOSE 777* >700*  --   --  143*  --   --  251*  --  244*  BUN 58* 72*  --   --  64*  --   --  43*  --  64*  CREATININE 9.02* 9.60*  --   --  10.04*  --   --  7.47*  --  8.88*  CALCIUM 8.1*  --   --   --  8.4*  --   --  7.6*  --  7.7*  MG  --   --   --   --   --  2.0 2.0 2.2 2.3  --   PHOS  --   --   --   --   --  2.8 3.2 4.4 4.2 4.3   GFR: Estimated Creatinine Clearance: 7.4 mL/min (A) (by C-G formula based on SCr of 8.88 mg/dL (H)). Recent Labs  Lab 05/28/21 1157 05/29/21 0539 05/30/21 0445 05/31/21 0416  WBC 8.0 8.4 8.4 7.9   Liver Function Tests: Recent Labs  Lab 05/28/21 1157 05/30/21 0445 05/31/21 0416  AST 36 19  --   ALT 20 15  --   ALKPHOS 69 47  --   BILITOT 0.8 0.5  --   PROT 7.4 6.0*  --   ALBUMIN 4.1 2.9* 2.7*   No results for input(s): LIPASE, AMYLASE in the last 168 hours. No results for input(s): AMMONIA in the last 168 hours.  ABG    Component Value Date/Time   PHART 7.394 05/28/2021 1318   PCO2ART 38.7 05/28/2021 1318   PO2ART 428 (H) 05/28/2021 1318   HCO3 20.4 05/28/2021 1346   TCO2 21 (L) 05/28/2021 1346   ACIDBASEDEF 4.0 (H) 05/28/2021 1346   O2SAT 95.0 05/28/2021 1346    Coagulation Profile: Recent Labs  Lab 05/28/21 1157  INR 1.1   Cardiac Enzymes: No results for input(s): CKTOTAL, CKMB, CKMBINDEX, TROPONINI in the last 168 hours.  HbA1C: Hgb A1c MFr Bld  Date/Time Value Ref Range Status  05/29/2021 10:16 AM 11.9 (H) 4.8 - 5.6 % Final    Comment:    (NOTE) Pre diabetes:          5.7%-6.4%  Diabetes:              >6.4%  Glycemic control for   <7.0% adults with diabetes   11/07/2019 07:17 AM 10.1 (H) 4.8 - 5.6 % Final    Comment:    (NOTE) Pre diabetes:          5.7%-6.4% Diabetes:              >6.4% Glycemic control for    <7.0% adults with diabetes    CBG: Recent Labs  Lab 05/30/21 2010 05/30/21 2012 05/30/21 2352 05/31/21 0406 05/31/21 0714  GLUCAP 70 72 142* [place]*   Maudie Mercury, MD IMTS, PGY-3 Pager: [phone] 05/31/2021,12:03 PM   If no response to pager , please call critical care on call (see AMION) until 7pm After 7:00 pm call Elink

## 2021-05-31 NOTE — Progress Notes (Signed)
VAST consulted to obtain IV access so CL could be dc'd. Pt has restricted left arm d/t AV fistula. Right arm assessed with ultrasound. Pt with extremely limited vasculature appropriate for PIV access.  Attempted to place USGIV in right anterior forearm unsuccessfully. RN and MD notified. CVC to remain in place at this time.

## 2021-05-31 NOTE — Progress Notes (Signed)
[facility] Progress Note   Assessment/ Plan:   OP HD: [place] MWF      400/ 1.5  70.5kg   2/2.5      Assessment/ Plan: AMS - w/ uncont diabetes (BS > 600), HTN emergency and new seizures.  Getting IV insulin and BP meds. Neuro consulting--> suspect hyperglycemia as cause of seizures.  ESRD - on HD MWF. Had HD Sat off schedule, missed Friday. Next HD today Monday August 15th.  VDRF - extubated DM2/ hyperglycemia - sp insulin gtt, back on SQ insulin now.  HTN urgency - on 3 BP lowering meds with soft BP--> off ACEi, on reduced doses of cardizem and hydralazine Volume - doesn't look vol overloaded. Could not reach UF goal due to large BP drop on HD Sat. At dry wt, no vol excess on exam. Keep even w/ HD. Dispo: pending  Subjective:    Extubated.  No distress, no complaints today.  For HD today.     Objective:   BP (!) 205/101   Pulse (!) 107   Temp 99.4 F (37.4 C) (Oral)   Resp 16   Ht '5\' 10"'  (1.778 m)   Wt 71 kg   SpO2 100%   BMI 22.46 kg/m   Physical Exam: Gen: NAD, sitting in bed CVS: RRR, II/VI systolie murmur Resp: clear anteriorly Abd: thin Ext: no LE edema ACCESS: L RC AVF pulsatile, R TDC  Labs: BMET Recent Labs  Lab 05/28/21 1157 05/28/21 1206 05/28/21 1318 05/28/21 1346 05/29/21 0539 05/29/21 1326 05/29/21 1710 05/30/21 0445 05/30/21 1754 05/31/21 0416  NA 129* 130* 130* 132* 139  --   --  134*  --  135  K 4.7 4.8 3.9 4.1 3.5  --   --  3.8  --  3.8  CL 92* 96*  --   --  102  --   --  100  --  100  CO2 21*  --   --   --  23  --   --  22  --  21*  GLUCOSE 777* >700*  --   --  143*  --   --  251*  --  244*  BUN 58* 72*  --   --  64*  --   --  43*  --  64*  CREATININE 9.02* 9.60*  --   --  10.04*  --   --  7.47*  --  8.88*  CALCIUM 8.1*  --   --   --  8.4*  --   --  7.6*  --  7.7*  PHOS  --   --   --   --   --  2.8 3.2 4.4 4.2 4.3   CBC Recent Labs  Lab 05/28/21 1157 05/28/21 1206 05/28/21 1346 05/29/21 0539 05/30/21 0445  05/31/21 0416  WBC 8.0  --   --  8.4 8.4 7.9  NEUTROABS 7.1  --   --   --   --  5.6  HGB 11.9*   < > 12.9* 10.7* 11.5* 10.8*  HCT 35.2*   < > 38.0* 30.8* 32.1* 32.0*  MCV 91.7  --   --  89.5 89.7 91.7  PLT 146*  --   --  130* 127* 134*   < > = values in this interval not displayed.      Medications:     aspirin  81 mg Per Tube Daily   atorvastatin  40 mg Per Tube QHS   chlorhexidine gluconate (  MEDLINE KIT)  15 mL Mouth Rinse BID   Chlorhexidine Gluconate Cloth  6 each Topical Daily   cholecalciferol  1,000 Units Per Tube Daily   diltiazem  60 mg Per Tube Q8H   docusate  100 mg Per Tube BID   heparin injection (subcutaneous)  5,000 Units Subcutaneous Q8H   hydrALAZINE  10 mg Per Tube Q8H   insulin aspart  0-15 Units Subcutaneous Q4H   insulin glargine-yfgn  32 Units Subcutaneous Daily   latanoprost  1 drop Both Eyes QHS   levETIRAcetam  500 mg Per Tube BID   LORazepam  2 mg Intravenous Once   mouth rinse  15 mL Mouth Rinse 10 times per day   mupirocin ointment  1 application Nasal BID   pantoprazole sodium  40 mg Per Tube Q24H   polyethylene glycol  17 g Per Tube Daily   sodium chloride flush  10-40 mL Intracatheter Q12H     Madelon Lips MD 05/31/2021, 11:17 AM

## 2021-05-31 NOTE — Procedures (Signed)
Extubation Procedure Note  Patient Details:   Name: Jonathan Kane DOB: Dec 08, 1945 MRN: [id number]   Airway Documentation:    Vent end date: 05/31/21 Vent end time: 0902   Evaluation  O2 sats: stable throughout Complications: No apparent complications Patient did tolerate procedure well. Bilateral Breath Sounds: Clear, Diminished   Yes  Patient extubated per order to 4L [place] with no apparent complications. Positive cuff leak was noted prior to extubation. Patient is alert and is able to follow commands, Patient is able to weakly speak and has strong cough. Vitals are stable. RT will continue to monitor.   Tylan Kinn Clyda Greener 05/31/2021, 9:09 AM

## 2021-05-31 NOTE — Progress Notes (Addendum)
Subjective: No further seizures, now following commands.   Exam: Vitals:   05/31/21 0735 05/31/21 0736  BP: (!) 144/88   Pulse: 92   Resp: 16   Temp:    SpO2: 100% 100%   Gen: In bed, NAD Resp: non-labored breathing, no acute distress Abd: soft, nt  Neuro: MS: awake, alert follows commands [id number], endorses seeing fingers wiggle in both hemifields Motor: moves all extremities to command.s  Sensory:intact to LT  Pertinent Labs: Glucose 244 Cr 8.88  MRI with small areas of diffusion positivitiy  Impression: 75 yo M with seizure in the setting of severe hyperglycemia. Unlikely to need long term antiepileptic therapy, but I would favor continuing while intubated.  He has several areas of diffusion positivity on his MRI.  These were read as acute infarcts, and some of them I do suspect are decompensated small vessel infarcts due to his dehydration in the setting of DKA.  Some of the other areas I suspect may actually be postictal change.  Embolic phenomena is possible, but I feel less likely.  Given that he does have a mildly dilated left atrium, it may be reasonable to do a 30-day monitor looking for atrial fibrillation.  Recommendations: 1) Continue keppra '500mg'$  BID 2) lipids, a1c 3) telemetry, would consider 30 day monitor at discharge.  4) asa '81mg'$  daily 5) will continue to follow.   Roland Rack, MD [facility] [phone]  If 7pm- 7am, please page neurology on call as listed in [place].

## 2021-06-01 ENCOUNTER — Inpatient Hospital Stay (HOSPITAL_COMMUNITY): Payer: Medicare Other

## 2021-06-01 DIAGNOSIS — I10 Essential (primary) hypertension: Secondary | ICD-10-CM | POA: Diagnosis not present

## 2021-06-01 DIAGNOSIS — E440 Moderate protein-calorie malnutrition: Secondary | ICD-10-CM | POA: Insufficient documentation

## 2021-06-01 LAB — CBC WITH DIFFERENTIAL/PLATELET
Abs Immature Granulocytes: 0 10*3/uL (ref 0.00–0.07)
Basophils Absolute: 0 10*3/uL (ref 0.0–0.1)
Basophils Relative: 0 %
Eosinophils Absolute: 0.1 10*3/uL (ref 0.0–0.5)
Eosinophils Relative: 1 %
HCT: 33.2 % — ABNORMAL LOW (ref 39.0–52.0)
Hemoglobin: 11 g/dL — ABNORMAL LOW (ref 13.0–17.0)
Lymphocytes Relative: 8 %
Lymphs Abs: 1 10*3/uL (ref 0.7–4.0)
MCH: 30.8 pg (ref 26.0–34.0)
MCHC: 33.1 g/dL (ref 30.0–36.0)
MCV: 93 fL (ref 80.0–100.0)
Monocytes Absolute: 0.8 10*3/uL (ref 0.1–1.0)
Monocytes Relative: 6 %
Neutro Abs: 11.1 10*3/uL — ABNORMAL HIGH (ref 1.7–7.7)
Neutrophils Relative %: 85 %
Platelets: 150 10*3/uL (ref 150–400)
RBC: 3.57 MIL/uL — ABNORMAL LOW (ref 4.22–5.81)
RDW: 14.5 % (ref 11.5–15.5)
WBC: 13 10*3/uL — ABNORMAL HIGH (ref 4.0–10.5)
nRBC: 0 % (ref 0.0–0.2)

## 2021-06-01 LAB — ECHOCARDIOGRAM COMPLETE
AR max vel: 2.19 cm2
AV Area VTI: 2.17 cm2
AV Area mean vel: 2.03 cm2
AV Mean grad: 5 mmHg
AV Peak grad: 7.4 mmHg
Ao pk vel: 1.36 m/s
Area-P 1/2: 3.39 cm2
Height: 70 in
MV VTI: 2.76 cm2
S' Lateral: 2.3 cm
Weight: 2532.64 oz

## 2021-06-01 LAB — LIPID PANEL
Cholesterol: 89 mg/dL (ref 0–200)
HDL: 32 mg/dL — ABNORMAL LOW (ref >40)
LDL Cholesterol: 37 mg/dL (ref 0–99)
Total CHOL/HDL Ratio: 2.8 RATIO
Triglycerides: 100 mg/dL (ref <150)
VLDL: 20 mg/dL (ref 0–40)

## 2021-06-01 LAB — COMPREHENSIVE METABOLIC PANEL
ALT: 52 U/L — ABNORMAL HIGH (ref 0–44)
AST: 88 U/L — ABNORMAL HIGH (ref 15–41)
Albumin: 2.7 g/dL — ABNORMAL LOW (ref 3.5–5.0)
Alkaline Phosphatase: 70 U/L (ref 38–126)
Anion gap: 15 (ref 5–15)
BUN: 35 mg/dL — ABNORMAL HIGH (ref 8–23)
CO2: 23 mmol/L (ref 22–32)
Calcium: 8 mg/dL — ABNORMAL LOW (ref 8.9–10.3)
Chloride: 97 mmol/L — ABNORMAL LOW (ref 98–111)
Creatinine, Ser: 6.53 mg/dL — ABNORMAL HIGH (ref 0.61–1.24)
GFR, Estimated: 8 mL/min — ABNORMAL LOW (ref >60)
Glucose, Bld: 157 mg/dL — ABNORMAL HIGH (ref 70–99)
Potassium: 4.4 mmol/L (ref 3.5–5.1)
Sodium: 135 mmol/L (ref 135–145)
Total Bilirubin: 0.9 mg/dL (ref 0.3–1.2)
Total Protein: 6.5 g/dL (ref 6.5–8.1)

## 2021-06-01 LAB — URINALYSIS, COMPLETE (UACMP) WITH MICROSCOPIC
Bilirubin Urine: NEGATIVE
Glucose, UA: 150 mg/dL — AB
Ketones, ur: 5 mg/dL — AB
Nitrite: NEGATIVE
Protein, ur: 100 mg/dL — AB
Specific Gravity, Urine: 1.011 (ref 1.005–1.030)
pH: 7 (ref 5.0–8.0)

## 2021-06-01 LAB — RENAL FUNCTION PANEL
Albumin: 2.8 g/dL — ABNORMAL LOW (ref 3.5–5.0)
Anion gap: 13 (ref 5–15)
BUN: 28 mg/dL — ABNORMAL HIGH (ref 8–23)
CO2: 24 mmol/L (ref 22–32)
Calcium: 8.1 mg/dL — ABNORMAL LOW (ref 8.9–10.3)
Chloride: 99 mmol/L (ref 98–111)
Creatinine, Ser: 5.47 mg/dL — ABNORMAL HIGH (ref 0.61–1.24)
GFR, Estimated: 10 mL/min — ABNORMAL LOW (ref >60)
Glucose, Bld: 130 mg/dL — ABNORMAL HIGH (ref 70–99)
Phosphorus: 4.5 mg/dL (ref 2.5–4.6)
Potassium: 4.3 mmol/L (ref 3.5–5.1)
Sodium: 136 mmol/L (ref 135–145)

## 2021-06-01 LAB — GLUCOSE, CAPILLARY
Glucose-Capillary: 132 mg/dL — ABNORMAL HIGH (ref 70–99)
Glucose-Capillary: 76 mg/dL (ref 70–99)
Glucose-Capillary: 149 mg/dL — ABNORMAL HIGH (ref 70–99)
Glucose-Capillary: 260 mg/dL — ABNORMAL HIGH (ref 70–99)
Glucose-Capillary: 305 mg/dL — ABNORMAL HIGH (ref 70–99)
Glucose-Capillary: 186 mg/dL — ABNORMAL HIGH (ref 70–99)

## 2021-06-01 LAB — HEMOGLOBIN A1C
Hgb A1c MFr Bld: 11.5 % — ABNORMAL HIGH (ref 4.8–5.6)
Mean Plasma Glucose: 283.35 mg/dL

## 2021-06-01 LAB — CBC
HCT: 33.7 % — ABNORMAL LOW (ref 39.0–52.0)
Hemoglobin: 11.3 g/dL — ABNORMAL LOW (ref 13.0–17.0)
MCH: 31 pg (ref 26.0–34.0)
MCHC: 33.5 g/dL (ref 30.0–36.0)
MCV: 92.6 fL (ref 80.0–100.0)
Platelets: 145 10*3/uL — ABNORMAL LOW (ref 150–400)
RBC: 3.64 MIL/uL — ABNORMAL LOW (ref 4.22–5.81)
RDW: 14.3 % (ref 11.5–15.5)
WBC: 12.9 10*3/uL — ABNORMAL HIGH (ref 4.0–10.5)
nRBC: 0 % (ref 0.0–0.2)

## 2021-06-01 LAB — PROTIME-INR
INR: 1.3 — ABNORMAL HIGH (ref 0.8–1.2)
Prothrombin Time: 16.5 seconds — ABNORMAL HIGH (ref 11.4–15.2)

## 2021-06-01 LAB — LACTIC ACID, PLASMA
Lactic Acid, Venous: 1 mmol/L (ref 0.5–1.9)
Lactic Acid, Venous: 1 mmol/L (ref 0.5–1.9)

## 2021-06-01 LAB — TRIGLYCERIDES: Triglycerides: 103 mg/dL (ref <150)

## 2021-06-01 LAB — APTT: aPTT: 38 seconds — ABNORMAL HIGH (ref 24–36)

## 2021-06-01 IMAGING — DX DG CHEST 1V PORT
1 series · 1 of 1 positions shown · non-contrast
Comparison: Radiograph [DATE]

CLINICAL DATA: Fever.

EXAM:
PORTABLE CHEST 1 VIEW

[chest]
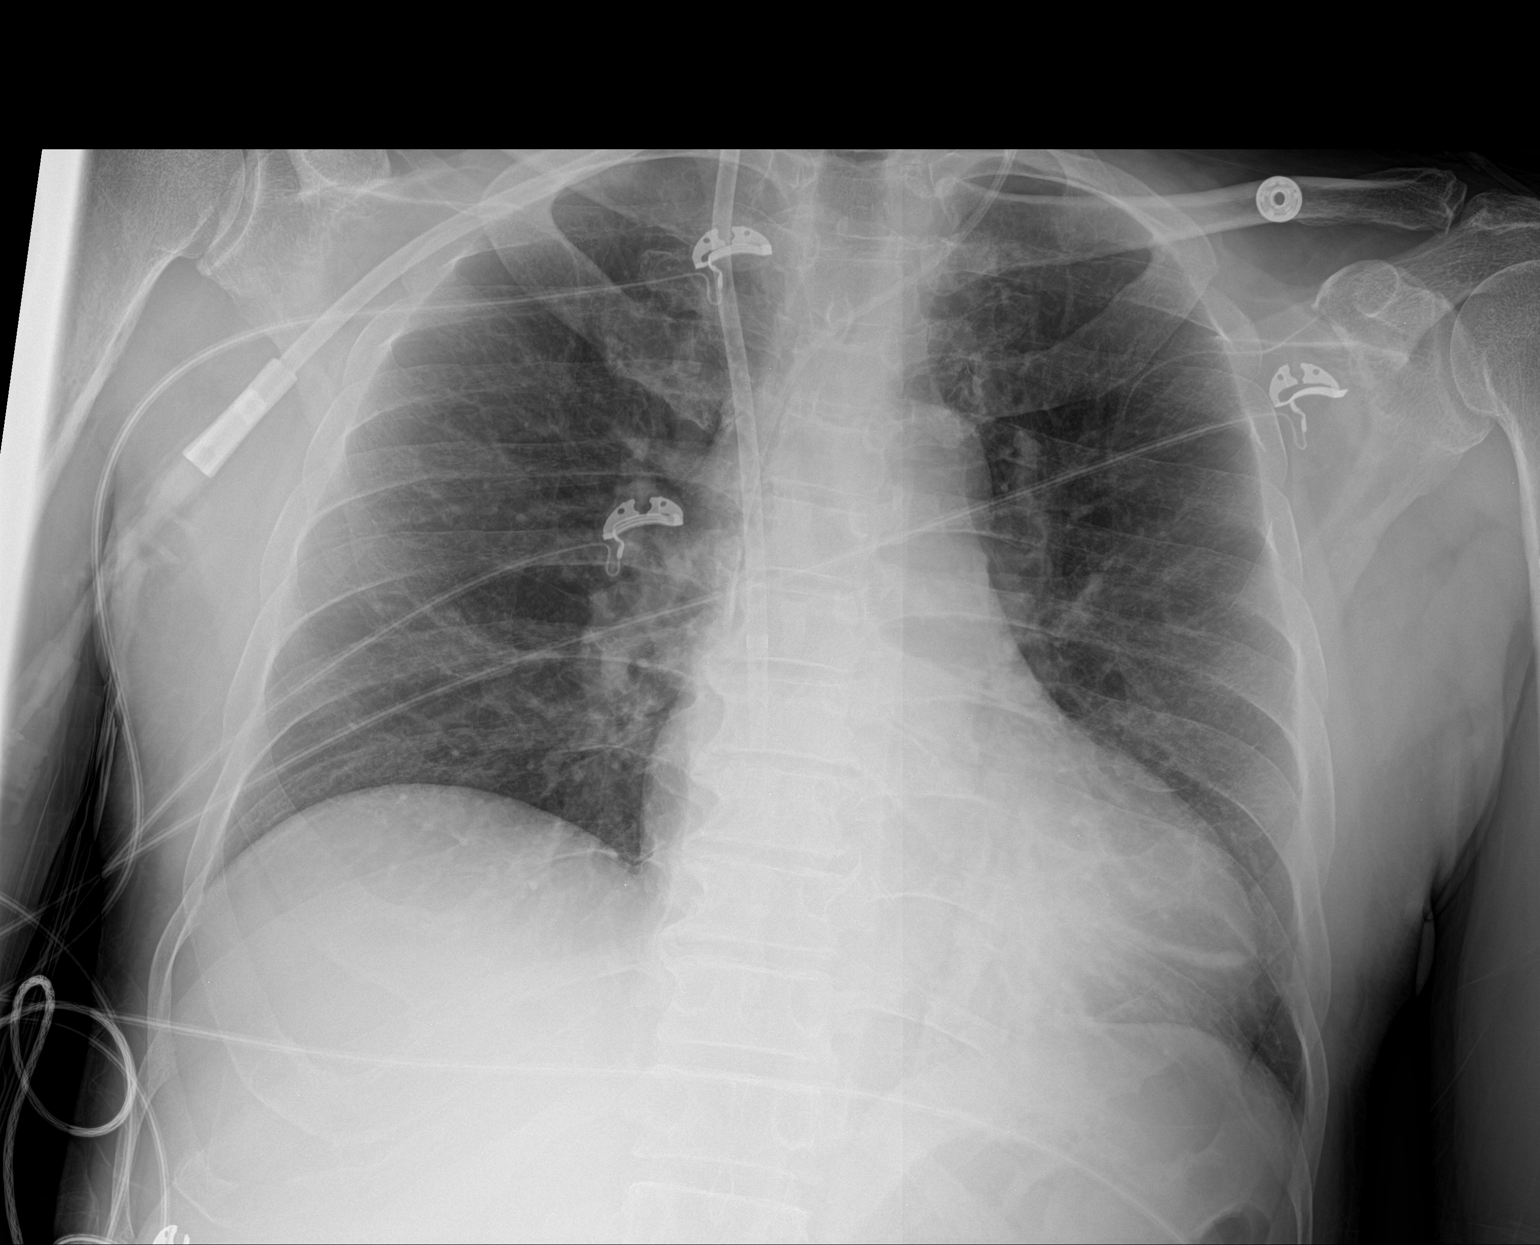

[1 of 1 positions shown; findings below may reference images not displayed]

FINDINGS: The enteric tube is been removed in the interim. Right internal
jugular catheter remains in place. Left jugular central line remains
in place. Mild cardiomegaly. Streaky and bandlike opacities at the
retrocardiac left lung base, increased from prior. Right lung is
clear. No pulmonary edema. No pneumothorax. No pleural effusion
IMPRESSION: Streaky and bandlike opacities at the left lung base, increased from
prior, favor atelectasis although pneumonia could have a similar
appearance in the setting of fever.

## 2021-06-01 MED ORDER — HYDRALAZINE HCL 25 MG PO TABS
25.0000 mg | ORAL_TABLET | Freq: Three times a day (TID) | ORAL | Status: DC
  Administered 2021-06-01 – 2021-06-08 (×22): 25 mg via ORAL
  Filled 2021-06-01 (×22): qty 1

## 2021-06-01 MED ORDER — INSULIN GLARGINE-YFGN 100 UNIT/ML ~~LOC~~ SOLN: 20.0000 [IU] | Freq: Every day | SUBCUTANEOUS | Status: DC

## 2021-06-01 MED ORDER — PIPERACILLIN-TAZOBACTAM IN DEX 2-0.25 GM/50ML IV SOLN
2.2500 g | Freq: Three times a day (TID) | INTRAVENOUS | Status: DC
  Administered 2021-06-01 – 2021-06-02 (×3): 2.25 g via INTRAVENOUS
  Filled 2021-06-01 (×5): qty 50

## 2021-06-01 MED ORDER — NEPRO/CARBSTEADY PO LIQD
237.0000 mL | Freq: Two times a day (BID) | ORAL | Status: DC
  Administered 2021-06-01 – 2021-06-08 (×11): 237 mL via ORAL

## 2021-06-01 MED ORDER — VANCOMYCIN HCL 1500 MG/300ML IV SOLN
1500.0000 mg | Freq: Once | INTRAVENOUS | Status: AC
  Administered 2021-06-01: 1500 mg via INTRAVENOUS
  Filled 2021-06-01: qty 300

## 2021-06-01 MED ORDER — DOCUSATE SODIUM 100 MG PO CAPS: 100.0000 mg | ORAL_CAPSULE | Freq: Two times a day (BID) | ORAL | Status: DC

## 2021-06-01 MED ORDER — VANCOMYCIN HCL IN DEXTROSE 750-5 MG/150ML-% IV SOLN
750.0000 mg | INTRAVENOUS | Status: DC
  Administered 2021-06-02 – 2021-06-08 (×2): 750 mg via INTRAVENOUS
  Filled 2021-06-01 (×7): qty 150

## 2021-06-01 MED ORDER — DOCUSATE SODIUM 100 MG PO CAPS
100.0000 mg | ORAL_CAPSULE | Freq: Two times a day (BID) | ORAL | Status: DC | PRN
  Filled 2021-06-01: qty 1

## 2021-06-01 MED ORDER — ATORVASTATIN CALCIUM 40 MG PO TABS
40.0000 mg | ORAL_TABLET | Freq: Every day | ORAL | Status: DC
  Administered 2021-06-01 – 2021-06-08 (×8): 40 mg via ORAL
  Filled 2021-06-01 (×8): qty 1

## 2021-06-01 MED ORDER — VITAMIN D 25 MCG (1000 UNIT) PO TABS
1000.0000 [IU] | ORAL_TABLET | Freq: Every day | ORAL | Status: DC
  Administered 2021-06-02 – 2021-06-08 (×7): 1000 [IU] via ORAL
  Filled 2021-06-01 (×7): qty 1

## 2021-06-01 MED ORDER — PIPERACILLIN-TAZOBACTAM IN DEX 2-0.25 GM/50ML IV SOLN
2.2500 g | Freq: Three times a day (TID) | INTRAVENOUS | Status: DC
  Filled 2021-06-01: qty 50

## 2021-06-01 MED ORDER — ASPIRIN 81 MG PO CHEW
81.0000 mg | CHEWABLE_TABLET | Freq: Every day | ORAL | Status: DC
  Administered 2021-06-02 – 2021-06-08 (×7): 81 mg via ORAL
  Filled 2021-06-01 (×7): qty 1

## 2021-06-01 MED ORDER — INSULIN GLARGINE-YFGN 100 UNIT/ML ~~LOC~~ SOLN
20.0000 [IU] | Freq: Every day | SUBCUTANEOUS | Status: DC
  Administered 2021-06-01 – 2021-06-06 (×6): 20 [IU] via SUBCUTANEOUS
  Filled 2021-06-01 (×7): qty 0.2

## 2021-06-01 MED ORDER — POLYETHYLENE GLYCOL 3350 17 G PO PACK: 17.0000 g | PACK | Freq: Every day | ORAL | Status: DC

## 2021-06-01 MED ORDER — PANTOPRAZOLE SODIUM 40 MG PO TBEC: 40.0000 mg | DELAYED_RELEASE_TABLET | Freq: Every day | ORAL | Status: DC

## 2021-06-01 MED ORDER — HYDRALAZINE HCL 20 MG/ML IJ SOLN: 10.0000 mg | Freq: Four times a day (QID) | INTRAMUSCULAR | Status: DC | PRN

## 2021-06-01 MED ORDER — RENA-VITE PO TABS
1.0000 | ORAL_TABLET | Freq: Every day | ORAL | Status: DC
  Administered 2021-06-01 – 2021-06-08 (×8): 1 via ORAL
  Filled 2021-06-01 (×8): qty 1

## 2021-06-01 MED ORDER — MORPHINE SULFATE (PF) 2 MG/ML IV SOLN
0.5000 mg | Freq: Once | INTRAVENOUS | Status: AC | PRN
  Administered 2021-06-01: 0.5 mg via INTRAVENOUS
  Filled 2021-06-01: qty 1

## 2021-06-01 MED ORDER — METRONIDAZOLE 500 MG/100ML IV SOLN: 500.0000 mg | Freq: Three times a day (TID) | INTRAVENOUS | Status: DC

## 2021-06-01 MED ORDER — LEVETIRACETAM 500 MG PO TABS
500.0000 mg | ORAL_TABLET | Freq: Two times a day (BID) | ORAL | Status: DC
  Administered 2021-06-01 – 2021-06-02 (×2): 500 mg via ORAL
  Filled 2021-06-01 (×2): qty 1

## 2021-06-01 NOTE — Progress Notes (Addendum)
Pharmacy Antibiotic Note  Jonathan Kane is a 75 y.o. male admitted on 05/28/2021 with HHS/HTN emergency, found unresponsive with new seizures. PMH significant for ESRD on HD MWF and is tolerating full dialysis sessions.  Patient today has increased WBC 12.9 and Tmax 102.9 with unknown source.  Pharmacy has been consulted for vanc/zosyn dosing.  Plan: Vancomycin 1500 mg IV once, then Vancomycin 750 mg IV MWF with HD  Zosyn 2.25g IV Q8h  F/U cultures, LOT, de-escalation    Height: '5\' 10"'$  (177.8 cm) Weight: 71.8 kg (158 lb 4.6 oz) IBW/kg (Calculated) : 73  Temp (24hrs), Avg:101.3 F (38.5 C), Min:99.7 F (37.6 C), Max:102.8 F (39.3 C)  Recent Labs  Lab 05/28/21 1157 05/28/21 1206 05/29/21 0539 05/30/21 0445 05/31/21 0416 06/01/21 0314 06/01/21 0316  WBC 8.0  --  8.4 8.4 7.9 12.9*  --   CREATININE 9.02* 9.60* 10.04* 7.47* 8.88*  --  5.47*    Estimated Creatinine Clearance: 12.2 mL/min (A) (by C-G formula based on SCr of 5.47 mg/dL (H)).    Allergies  Allergen Reactions   Dulaglutide     TRULICITY:  GI Upset intolerance    Antimicrobials this admission: Vancomycin 8/16>>  Zosyn 8/16>>   Dose adjustments this admission: N/A  Microbiology results: 8/16 BCx: sent 8/12 MRSA PCR: not detected   Adria Dill, PharmD PGY-1 Acute Care Resident  06/01/2021 11:41 AM

## 2021-06-01 NOTE — Progress Notes (Signed)
Subjective: No further seizures, now following commands.   Exam: Vitals:   06/01/21 1600 06/01/21 1700  BP: (!) 151/80   Pulse: 100 (!) 102  Resp: (!) 21 (!) 23  Temp:    SpO2: 93% 95%   Gen: In bed, NAD Resp: non-labored breathing, no acute distress Abd: soft, nt  Neuro: MS: awake, alert follows commands [id number], endorses seeing fingers wiggle in both hemifields Motor: moves all extremities to command.s  Sensory:intact to LT  Pertinent Labs: Glucose 244 Cr 8.88   Impression: 75 yo M with seizure in the setting of severe hyperglycemia. Unlikely to need long term antiepileptic therapy, but I would favor continuing while intubated.  He has several areas of diffusion positivity on his MRI.  These were read as acute infarcts, and some of them I do suspect are decompensated small vessel infarcts due to his dehydration in the setting of DKA.  Some of the other areas I suspect may actually be postictal change.  Embolic phenomena is possible, but I feel less likely.  Given that he does have a mildly dilated left atrium, it may be reasonable to do a 30-day monitor looking for atrial fibrillation.  He has some left sided weakness that I think is new, though difficult given this is my first day examining him without intubation. Will repeat MRI to ensure stability.   Recommendations: 1) Continue keppra '500mg'$  BID 3) telemetry, would consider 30 day monitor at discharge.  4) asa '81mg'$  daily 5) MRI brain 6) PT,OT,ST  Roland Rack, MD [facility] [phone]  If 7pm- 7am, please page neurology on call as listed in [place].

## 2021-06-01 NOTE — Evaluation (Signed)
Clinical/Bedside Swallow Evaluation Patient Details  Name: Jonathan Kane MRN: [id number] Date of Birth: August 22, 1946  Today's Date: 06/01/2021 Time: SLP Start Time (ACUTE ONLY): 0840 SLP Stop Time (ACUTE ONLY): [id number] SLP Time Calculation (min) (ACUTE ONLY): 18 min  Past Medical History:  Past Medical History:  Diagnosis Date   Diabetes mellitus without complication ([place])    End stage kidney disease ([place])    Erectile dysfunction    Hypertension    Vitamin D deficiency    Past Surgical History:  Past Surgical History:  Procedure Laterality Date   COLONOSCOPY     IR DIALY SHUNT INTRO NEEDLE/INTRACATH INITIAL W/IMG LEFT Left 06/25/2020   keloid removed     HPI:  75 year old male with a diabetes and hypertension who presented with hypertensive emergency, hyperglycemic nonketotic coma and new onset seizure in the setting of hypoglycemia. Intubated 8/12-8/15. MRI shows small strokes, but neurologist questions if some of these may be postictal changes. Report reads "Scattered punctate acute infarctions most consistent with embolic  disease from the heart or ascending aorta. Punctate acute  infarctions are visible at the right parietooccipital junction, the  left temporoparietal junction, and at both posterior frontal vertex  cortical and subcortical regions."   Assessment / Plan / Recommendation Clinical Impression  Pt does not show any signs of acute dysphagia, he is alert and attentive to PO though he is confused and needs assistance with feeding. At times he has been less alert and more confused than today per RN. Given that he is edentulous and confused, will initaite a dys 2 (finely chopped diet) with thin liquids, pills whole in puree. SLP will f/u x1 at least for tolerance. SLP Visit Diagnosis: Dysphagia, oropharyngeal phase (R13.12)    Aspiration Risk  Mild aspiration risk    Diet Recommendation Dysphagia 2 (Fine chop);Thin liquid   Liquid Administration via: Straw Medication  Administration: Whole meds with puree Supervision: Staff to assist with self feeding Compensations: Minimize environmental distractions;Slow rate;Small sips/bites Postural Changes: Seated upright at 90 degrees    Other  Recommendations Oral Care Recommendations: Oral care BID   Follow up Recommendations None      Frequency and Duration min 2x/week  1 week       Prognosis Prognosis for Safe Diet Advancement: Good      Swallow Study   General HPI: 75 year old male with a diabetes and hypertension who presented with hypertensive emergency, hyperglycemic nonketotic coma and new onset seizure in the setting of hypoglycemia. Intubated 8/12-8/15. MRI shows small strokes, but neurologist questions if some of these may be postictal changes. Report reads "Scattered punctate acute infarctions most consistent with embolic  disease from the heart or ascending aorta. Punctate acute  infarctions are visible at the right parietooccipital junction, the  left temporoparietal junction, and at both posterior frontal vertex  cortical and subcortical regions." Type of Study: Bedside Swallow Evaluation Previous Swallow Assessment: none Diet Prior to this Study: NPO Temperature Spikes Noted: No Respiratory Status: Room air History of Recent Intubation: Yes Length of Intubations (days): 4 days Date extubated: 05/31/21 Behavior/Cognition: Alert;Cooperative;Requires cueing Oral Cavity Assessment: Within Functional Limits Oral Care Completed by SLP: No Oral Cavity - Dentition: Edentulous Vision: Functional for self-feeding Self-Feeding Abilities: Able to feed self Patient Positioning: Upright in bed Baseline Vocal Quality: Normal Volitional Cough: Strong Volitional Swallow: Able to elicit    Oral/Motor/Sensory Function Overall Oral Motor/Sensory Function: Within functional limits   Ice Chips Ice chips: Within functional limits   Thin Liquid Thin Liquid:  Within functional limits Presentation: Straw;Cup     Nectar Thick Nectar Thick Liquid: Not tested   Honey Thick Honey Thick Liquid: Not tested   Puree Puree: Within functional limits   Solid     Solid: Within functional limits     Herbie [place], MA CCC-SLP  Acute Rehabilitation Services Office [phone]  Lynann Beaver 06/01/2021,9:02 AM

## 2021-06-01 NOTE — Progress Notes (Signed)
PT Cancellation Note  Patient Details Name: Jonathan Kane MRN: [id number] DOB: Jan 04, 1946   Cancelled Treatment:    Reason Eval/Treat Not Completed: Patient at procedure or test/unavailable  Patient with IV team. Will reattempt as schedule permits.   Arby Barrette, PT Pager [phone]   Rexanne Mano 06/01/2021, 1:12 PM

## 2021-06-01 NOTE — Progress Notes (Signed)
eLink Physician-Brief Progress Note Patient Name: Jonathan Kane DOB: 1946/01/27 MRN: [id number]   Date of Service  06/01/2021  HPI/Events of Note  Notified of patient complaining of pain On discussion with bedside RN pain is at groin/pubic area. Condom cath on. Abdomen not distended nor tender  eICU Interventions  Ordered a one time dose of low dose morphine 0.5 mg IV     Intervention Category Minor Interventions: Routine modifications to care plan (e.g. PRN medications for pain, fever)  [name] [name] 06/01/2021, 3:09 AM

## 2021-06-01 NOTE — Evaluation (Signed)
Speech Language Pathology Evaluation Patient Details Name: BEECHER VIVERETTE MRN: [id number] DOB: 23-Jan-1946 Today's Date: 06/01/2021 Time: [id number] SLP Time Calculation (min) (ACUTE ONLY): 18 min  Problem List:  Patient Active Problem List   Diagnosis Date Noted   DKA (diabetic ketoacidosis) ([place]) 05/28/2021   Status epilepticus ([place])    Memory loss 04/13/2021   Cerebrovascular accident (CVA) ([place]) 01/12/2021   DM type 2 with diabetic peripheral neuropathy ([place]) 01/12/2021   Normal anion gap metabolic acidosis [id number]   Secondary hyperparathyroidism of renal origin ([place]) [id number]   Acute metabolic encephalopathy [id number]   Acute on chronic renal failure ([place]) 11/07/2019   CKD (chronic kidney disease) stage 5, GFR less than 15 ml/min (HCC) 09/02/2019   CKD (chronic kidney disease) stage 4, GFR 15-29 ml/min (HCC) 08/01/2019   Paronychia of fifth toe of left foot 05/29/2019   Microalbuminuria due to type 2 diabetes mellitus ([place]) 02/14/2019   Posterior vitreous detachment of both eyes 01/17/2019   Presbyopia of both eyes 01/17/2019   Pseudophakia of both eyes 01/17/2019   Abnormal finding on diagnostic imaging of right testicle 09/14/2018   Epididymitis, left 09/14/2018   Swelling of left testicle 08/23/2018   Primary open angle glaucoma (POAG) of both eyes, mild stage 01/02/2018   Benign hypertension with chronic kidney disease, stage V ([place]) 11/30/2017   Proteinuria 11/30/2017   Mouth burn, initial encounter 11/10/2017   Vitamin D deficiency, unspecified 11/10/2017   Erectile dysfunction associated with type 2 diabetes mellitus ([place]) 08/29/2017   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 ([place]) 08/16/2017   Hypoglycemia 08/16/2017   PCO (posterior capsular opacification), left 03/03/2017   Hypermetropia of both eyes 03/03/2017   Cocaine dependence in remission ([place]) 07/26/2016   Herpes simplex infection 07/26/2016   CKD stage 4 due to type 2 diabetes mellitus  ([place]) 07/07/2016   Noncompliance with diabetes treatment 07/07/2016   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 03/21/2016   Combined arterial insufficiency and corporo-venous occlusive erectile dysfunction 03/04/2016   Allergic rhinitis with postnasal drip 02/24/2016   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   Type 2 diabetes mellitus ([place]) 02/23/2016   Essential hypertension 02/23/2016   Musculoskeletal chest pain 02/23/2016   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[place]) 12/09/2015   Polyneuropathy, unspecified 11/12/2013   Constipation 03/23/2013   Hallucinations 03/23/2013   Polyneuropathy in diabetes ([place]) 03/23/2013   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   Diabetes mellitus type II, controlled ([place]) 08/30/2011   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 Intubated 8/12-8/15. MRI shows small strokes, but neurologist questions if some of these may be postictal changes. Report reads "Scattered punctate acute infarctions most consistent with embolic  disease from the heart or ascending aorta. Punctate acute  infarctions are visible at the right parietooccipital junction, the  left temporoparietal junction, and at both posterior frontal vertex  cortical and subcortical regions."   Assessment / Plan / Recommendation Clinical Impression   Pt demonstrates primary cognitive impairment with potential for mild expressive aphasia given occasional phonemic paraphasias and perseveration present in short phrases and naming. Pts performance in areas such as memory, ability to follow multistep commands or participation in functional tasks seems mostly impaired by pts poorly sustained attention. Pt is restless, distracted by pain and discomfort. Needs frequent redirection. He is able to follow commands, repeat phrases and participate in orientation questions if attentive. Will continue diagnotic treatment of language and cognition. Recommend CIR.    SLP Assessment  SLP Recommendation/Assessment: Patient needs continued Speech Lanaguage Pathology Services SLP Visit Diagnosis: Dysphagia, oropharyngeal phase (R13.12)    Follow Up Recommendations  Inpatient Rehab    Frequency and Duration min 2x/week  2 weeks      SLP Evaluation Cognition  Overall Cognitive Status: Impaired/Different from baseline Arousal/Alertness: Awake/alert Orientation Level: Oriented to person;Oriented to place;Disoriented to time;Disoriented to situation Attention: Focused;Sustained Focused Attention: Appears intact Sustained Attention: Impaired Memory: Impaired Memory Impairment: Decreased short term memory Decreased Short Term Memory: Verbal basic Awareness: Appears intact Behaviors: Restless       Comprehension  Auditory Comprehension Overall Auditory Comprehension: Appears within functional limits for tasks assessed Yes/No Questions: Within Functional Limits Commands: Within Functional Limits Conversation: Simple Interfering Components: Attention    Expression Verbal Expression Overall Verbal Expression: Impaired Automatic Speech: Name;Social Response Level of Generative/Spontaneous Verbalization: Conversation Repetition: No impairment Naming: Impairment Responsive: 26-50% accurate Confrontation: Within functional limits Divergent: 0-24%  accurate Verbal Errors: Phonemic paraphasias;Aware of errors Pragmatics: No impairment Written Expression Dominant Hand: Right   Oral / Motor  Oral Motor/Sensory Function Overall Oral Motor/Sensory Function: Within functional limits Motor Speech Overall Motor Speech: Appears within functional limits for tasks assessed   GO                    Nya Monds, Katherene Ponto 06/01/2021, 9:13 AM

## 2021-06-01 NOTE — Progress Notes (Signed)
  Echocardiogram 2D Echocardiogram has been performed.  Marybelle Killings 06/01/2021, 5:42 PM

## 2021-06-01 NOTE — Evaluation (Signed)
Occupational Therapy Evaluation Patient Details Name: Jonathan Kane MRN: [id number] DOB: 02-20-1946 Today's Date: 06/01/2021    History of Present Illness 75 y.o. male presenting to ED 8/11 after being found on the floor by a neighbor. CBG >600. En route to ED patient developed R gaze preference and L-sided weakness. Code stroke called. Patient developed seizure and required intubation for airway protection. SBP 229/127 in ED. Patient admitted with acute metabolic encephalopathy with hyperglycemia, hypertensive emergency and new onset seizure with status epilepticus. 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 Extubated 8/15 for HD. Overnight EEG 8/13-8/14 with new onset status epilepticus likely secondary to severe hyperglycemia. PMHx significant for ESRD on HD MWF, DMII, BPH with obstruction, Hep C, and HTN.   Clinical Impression   Patient is a questionable historian with history obtained mostly from met chart. Patient able to confirm home set-up reporting no changes from last admission 1 year ago. PTA patient was living alone in a private residence and was grossly I with ADLs/IADLs without AD. Patient currently functioning below baseline demonstrating bed mobility with Mod to Max A with max multimodal cues and observed ADLs including grooming and LB dressing with Mod to Max A grossly at bed level. Functional transfers deferred as +2 assist not available at time of evaluation. Patient also limited by deficits listed below including L-hemiparesis, L inattention, decreased balance, generalized weakness and decreased cognition and would benefit from continued acute OT services in prep for safe d/c to next level of care. Patient would benefit from intensive post acute rehab to maximize safety/independence with self-care tasks.     Follow Up Recommendations  CIR    Equipment Recommendations  Other (comment) (Defer to next level of care.)    Recommendations for Other Services Rehab  consult     Precautions / Restrictions Precautions Precautions: Fall Restrictions Weight Bearing Restrictions: No      Mobility Bed Mobility Overal bed mobility: Needs Assistance Bed Mobility: Rolling;Sidelying to Sit;Sit to Supine Rolling: Min assist;Max assist Sidelying to sit: Max assist;HOB elevated       General bed mobility comments: Min A to roll to L and Max A to roll to R with multimodal cues. Max A to advance LLE toward EOB and elevate trunk with mutlimodal cues for sequencing. Max A to return to supine to control descent and advance BLE from EOB to bed surface.    Transfers Overall transfer level: Needs assistance               General transfer comment: Deferred. Requires +2 assist for safety.    Balance Overall balance assessment: Needs assistance Sitting-balance support: Single extremity supported;Feet supported Sitting balance-Leahy Scale: Poor Sitting balance - Comments: Mod A to maintain static sitting balance at EOB with RUE supported on bed surface. Unware of position of LUE. Postural control: Posterior lean                                 ADL either performed or assessed with clinical judgement   ADL Overall ADL's : Needs assistance/impaired Eating/Feeding: NPO   Grooming: Moderate assistance Grooming Details (indicate cue type and reason): Mod A and hand over hand to wash face; cues for initiation and sequencing.             Lower Body Dressing: Maximal assistance;Bed level Lower Body Dressing Details (indicate cue type and reason): Max A to don footwear in supine.  General ADL Comments: Patient greatly limited by decreased cognition, generalized weakness, L-sided weakness, L inattention, and decreased balance.     Vision Baseline Vision/History: No visual deficits Patient Visual Report: No change from baseline Vision Assessment?: No apparent visual deficits Additional Comments: Able to correctly  identify number of therapists fingers presented in central vision and recite words on sign on far wall in room (although he recites words out of order, corrects with cues).     Perception Perception Perception Tested?: Yes Perception Deficits: Inattention/neglect Inattention/Neglect: Does not attend to left side of body   Praxis      Pertinent Vitals/Pain Pain Assessment: Faces Faces Pain Scale: Hurts little more Pain Location: Bilateral thighs Pain Descriptors / Indicators: Aching;Sore Pain Intervention(s): Monitored during session     Hand Dominance Right   Extremity/Trunk Assessment Upper Extremity Assessment Upper Extremity Assessment: RUE deficits/detail;LUE deficits/detail RUE Deficits / Details: AROM WFL; MMT grossly 4-/5 RUE Sensation: WNL RUE Coordination: WNL LUE Deficits / Details: MMT 2-/5 shoulder, 2-/5 triceps; 3-/5 biceps; 3-/5 gross grasp LUE Sensation: WNL LUE Coordination: decreased fine motor;decreased gross motor   Lower Extremity Assessment Lower Extremity Assessment: Defer to PT evaluation   Cervical / Trunk Assessment Cervical / Trunk Assessment: Normal   Communication Communication Communication: Other (comment) (Soft spoken)   Cognition Arousal/Alertness: Lethargic;Awake/alert (Initially lethargic but more awake as session progressed.) Behavior During Therapy: Flat affect Overall Cognitive Status: Impaired/Different from baseline Area of Impairment: Orientation;Attention;Memory;Following commands;Awareness;Problem solving                 Orientation Level: Disoriented to;Time;Situation Current Attention Level: Sustained Memory: Decreased short-term memory Following Commands: Follows one step commands inconsistently;Follows one step commands with increased time   Awareness: Intellectual Problem Solving: Slow processing;Decreased initiation;Difficulty sequencing;Requires verbal cues;Requires tactile cues General Comments: Patient follows  1-step verbal commands inconsistently; requires increased time to process verbal information. Unable to recall name of current president even with multichoice cues.   General Comments       Exercises     Shoulder Instructions      Home Living Family/patient expects to be discharged to:: Private residence Living Arrangements: Alone Available Help at Discharge: Family;Available PRN/intermittently Type of Home: House Home Access: Stairs to enter [employer] of Steps: 1 Entrance Stairs-Rails: None Home Layout: One level     Bathroom Shower/Tub: Occupational psychologist: Standard     Home Equipment: Environmental consultant - 2 wheels;Cane - single point;Grab bars - tub/shower;Grab bars - toilet;Shower seat - built in          Prior Functioning/Environment Level of Independence: Independent        Comments: Patient is a questionable historian. Reports I with ADLs/IADLs; reports driving; confirms home set-up from med chart obtained at time of last admission in 2021.        OT Problem List: Decreased strength;Decreased range of motion;Decreased activity tolerance;Impaired balance (sitting and/or standing);Decreased coordination;Decreased cognition;Decreased knowledge of use of DME or AE;Impaired UE functional use;Pain      OT Treatment/Interventions: Self-care/ADL training;Therapeutic exercise;Neuromuscular education;Energy conservation;DME and/or AE instruction;Therapeutic activities;Cognitive remediation/compensation;Patient/family education;Balance training    OT Goals(Current goals can be found in the care plan section) Acute Rehab OT Goals Patient Stated Goal: No goals stated. OT Goal Formulation: Patient unable to participate in goal setting Time For Goal Achievement: 06/15/21 Potential to Achieve Goals: Good ADL Goals Pt Will Perform Eating: Independently;sitting Pt Will Perform Grooming: with supervision;standing Pt Will Perform Upper Body Dressing: with  supervision;sitting Pt Will Perform  Lower Body Dressing: sit to/from stand;with supervision Pt Will Transfer to Toilet: with supervision;ambulating Pt Will Perform Toileting - Clothing Manipulation and hygiene: with supervision;sit to/from stand Pt/caregiver will Perform Home Exercise Program: Increased ROM;Increased strength;Left upper extremity;With Supervision Additional ADL Goal #1: Patient will attend to 2 grooming tasks seated EOB without cues indicating increased attention to task.  OT Frequency: Min 2X/week   Barriers to D/C: Decreased caregiver support  Lives alone       Co-evaluation              AM-PAC OT "6 Clicks" Daily Activity     Outcome Measure Help from another person eating meals?: Total (NPO) Help from another person taking care of personal grooming?: A Lot Help from another person toileting, which includes using toliet, bedpan, or urinal?: Total Help from another person bathing (including washing, rinsing, drying)?: A Lot Help from another person to put on and taking off regular upper body clothing?: A Lot Help from another person to put on and taking off regular lower body clothing?: A Lot 6 Click Score: 10   End of Session Nurse Communication: Mobility status;Other (comment) (Response to treatment)  Activity Tolerance: Patient tolerated treatment well;Patient limited by lethargy Patient left: in bed;with call bell/phone within reach;with bed alarm set  OT Visit Diagnosis: Unsteadiness on feet (R26.81);Muscle weakness (generalized) (M62.81);Hemiplegia and hemiparesis;Pain Hemiplegia - Right/Left: Left Hemiplegia - dominant/non-dominant: Non-Dominant Hemiplegia - caused by: Cerebral infarction Pain - Right/Left:  (bilateral) Pain - part of body: Leg                Time: 1194-1740 OT Time Calculation (min): 31 min Charges:  OT General Charges $OT Visit: 1 Visit OT Evaluation $OT Eval Moderate Complexity: 1 Mod OT Treatments $Therapeutic Activity:  8-22 mins  Jya Hughston H. OTR/L Supplemental OT, Department of rehab services [phone]  Saraih Lorton R H. 06/01/2021, 9:20 AM

## 2021-06-01 NOTE — Progress Notes (Signed)
[facility] Progress Note  Patient: Jonathan Kane    LKG:[id number]  DOA: 05/28/2021     Date of Service: the patient was seen and examined on 06/01/2021  Brief hospital course: Past medical history of type II DM, ESRD on HD, HTN, substance abuse, hep C, HSV, BPH, alcohol abuse.  Presents with complaints of unresponsiveness found to have severe hyperglycemia and hypertensive urgency on admission required intubation. 8/12 Admit, neurology and nephrology consulted, started on insulin gtt, EEG monitoring 8/13 LTM with EEG 8/14: Weaned off Propofol, MRI with new punctate stroke over multiple territories  8/15: Extubated, 8/16 have sepsis-like physiology.  Currently plan is continue IV antibiotics and further work-up for sepsis.  Subjective: Denies any acute complaint.  No nausea no vomiting.  No acute events overnight. Early this morning started to have fever with temp of 101.  No shortness of breath.  No diarrhea no constipation.  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  Required intubation. MRI from 8/14 showing acute punctate lesions, suspect this appears to be hemodynamic insults from seizures.  But it would also be an embolic infarct. Continue ASA daily, on high intensity statin. Echocardiogram ordered. Not on any anticoagulation right now. Will require 30-day monitor. Currently on Keppra 5 mg twice daily. IV Ativan 2 mg as needed.  Acute Hypoxemic Respiratory Failure: - Intubated due to clinical coma, status epilepticus, extubated 8/15.  -Extubated on 8/15.  Tolerating room air.   DM type 2 poorly controlled with hyperglycemia with renal complication with long-term insulin use Blood sugars low.  Secondary to  poor p.o. intake and tube feeding the discontinuation.  We will reduce Lantus.  Continue sliding scale insulin every 6 hours.   ESRD on HD: MWF. Tolerating HD. Appreciate oncology assistance.   HTN emergency Hx of HLD: Blood pressure significantly elevated. Currently on IV permissive hypertension.  Sepsis. Not present on admission. Started to have fever with tachycardia and tachypnea on/8/16. Blood culture performed. Started on IV vancomycin and Zosyn. Avoiding cefepime in the setting of renal dysfunction. Echocardiogram performed shows no evidence of vegetation. Check procalcitonin in the morning.  Scheduled Meds:  amLODipine  5 mg Oral Daily   [START ON 06/02/2021] aspirin  81 mg Oral Daily   atorvastatin  40 mg Oral QHS   chlorhexidine gluconate (MEDLINE KIT)  15 mL Mouth Rinse BID   Chlorhexidine Gluconate Cloth  6 each Topical Daily   [START ON 06/02/2021] cholecalciferol  1,000 Units Oral Daily   feeding supplement (NEPRO CARB STEADY)  237 mL Oral BID BM   heparin injection (subcutaneous)  5,000 Units Subcutaneous Q8H   hydrALAZINE  25 mg Oral Q8H   insulin aspart  0-15 Units Subcutaneous Q4H   insulin glargine-yfgn  20 Units Subcutaneous Daily   latanoprost  1 drop Both Eyes QHS   levETIRAcetam  500 mg Oral BID   mouth rinse  15 mL Mouth Rinse 10 times per day   multivitamin  1 tablet Oral QHS   mupirocin ointment  1 application Nasal BID   sodium chloride flush  10-40 mL Intracatheter Q12H   Continuous Infusions:  sodium chloride Stopped (05/31/21 1252)   piperacillin-tazobactam (ZOSYN)  IV Stopped (06/01/21 1308)   [START ON 06/02/2021] vancomycin     PRN Meds: acetaminophen, docusate sodium, labetalol, lidocaine, sodium  chloride flush  Body mass index is 22.71 kg/m.  Nutrition Problem: Moderate Malnutrition Etiology: chronic illness (ESRD on HD, DM)     DVT Prophylaxis:   heparin injection 5,000 Units Start: 05/28/21 2200    Advance goals of care  discussion: Pt is Full code.  Family Communication: no family was present at bedside, at the time of interview.   Data Reviewed: I have personally reviewed and interpreted daily labs, tele strips, imaging. Sodium 135, potassium 4.4.  LFT mildly elevated at 88 and 52.  Lactic acid normal.  WBC 13.0.  Hemoglobin 11 stable.  Platelets stable.  Smear negative.  INR 1.3.  Hemoglobin A1c 11.5.  Chest x-ray unremarkable.  UA shows mild pyuria.  Negative nitrates.  Physical Exam:  General: Appear in mild distress, no Rash; Oral Mucosa Clear, moist. no Abnormal Neck Mass Or lumps, Conjunctiva normal  Cardiovascular: S1 and S2 Present, no Murmur, Respiratory: good respiratory effort, Bilateral Air entry present and CTA, no Crackles, no wheezes Abdomen: Bowel Sound present, Soft and no tenderness Extremities: no Pedal edema Neurology: alert and appropriateoriented to place and person affect . no new focal deficit Gait not checked due to patient safety concerns  Vitals:   06/01/21 1500 06/01/21 1503 06/01/21 1600 06/01/21 1700  BP: (!) 148/80  (!) 151/80   Pulse: (!) 103  100 (!) 102  Resp: (!) 23  (!) 21 (!) 23  Temp:  (!) 100.4 F (38 C)    TempSrc:  Oral    SpO2: 92%  93% 95%  Weight:      Height:        Disposition:  Status is: Inpatient  Remains inpatient appropriate because:Ongoing diagnostic testing needed not appropriate for outpatient work up  Dispo: The patient is from: Home              Anticipated d/c is to: CIR              Patient currently is not medically stable to d/c.   Difficult to place patient No  Time spent: 35 minutes. I reviewed all nursing notes, pharmacy notes, vitals, pertinent old records. I have discussed plan of care as described above with RN.  Author: Berle Mull, MD [facility] 06/01/2021 6:38 PM  To reach On-call, see care teams to locate the attending and reach out via [email or web address]. Between 7PM-7AM, please contact night-coverage If you  still have difficulty reaching the attending provider, please page the [facility] (Director on Call) for [facility] on amion for assistance.

## 2021-06-01 NOTE — Progress Notes (Signed)
Rehab Admissions Coordinator Note:  Patient was screened by Cleatrice Burke for appropriateness for an Inpatient Acute Rehab Consult per OT recs. At this time, we are recommending Inpatient Rehab consult. I will place order per protocol.  Cleatrice Burke RN MSN 06/01/2021, 11:49 AM  I can be reached at [phone].

## 2021-06-01 NOTE — Progress Notes (Signed)
eLink Physician-Brief Progress Note Patient Name: Jonathan Kane DOB: [birth year] MRN: [id number]   Date of Service  06/01/2021  HPI/Events of Note  Notified of being unable to give patient oral medications. BP 180/89  HR 109  eICU Interventions  Patient to be given labetalol prn as ordered     Intervention Category Major Interventions: Hypertension - evaluation and management  Judd Lien 06/01/2021, 12:45 AM

## 2021-06-01 NOTE — Progress Notes (Signed)
[facility] Progress Note   Assessment/ Plan:   OP HD: [place] MWF      400/ 1.5  70.5kg   2/2.5      Assessment/ Plan: Acute encephalopathy - w/ uncont diabetes (BS > 600), HTN emergency and new seizures.  Getting IV insulin and BP meds. Neuro consulting--> suspect hyperglycemia as cause of seizures. On Keppra.  MRI 05/30/21 with punctate areas of infarct- appear embolic vs watershed per neuro note Acute embolic cerebral infarctions: 30 day monitor recommended per neuro ESRD - on HD MWF. Had HD Sat off schedule, Now back on MWF schedule- orders written for tomorrow VDRF - extubated DM2/ hyperglycemia - sp insulin gtt, back on SQ insulin now.  HTN: on 3 BP lowering meds with soft BP--> off ACEi, on hydralazine 25 mg TID and amlodipine 5 mg daily Volume: appears euvolemic Dispo: pending  Subjective:    HD yesterday without incident.  500 mL stool OP.  Having fevers- up to 101.1, WBC ct up.     Objective:   BP (!) 184/96   Pulse (!) 113   Temp (!) 101.1 F (38.4 C) (Axillary)   Resp 17   Ht '5\' 10"'  (1.778 m)   Wt 71.8 kg   SpO2 94%   BMI 22.71 kg/m   Physical Exam: Gen: NAD, sitting in bed CVS: RRR, II/VI systolic murmur Resp: clear anteriorly Abd: thin Ext: no LE edema ACCESS: L RC AVF pulsatile, R TDC  Labs: BMET Recent Labs  Lab 05/28/21 1157 05/28/21 1206 05/28/21 1318 05/28/21 1346 05/29/21 0539 05/29/21 1326 05/29/21 1710 05/30/21 0445 05/30/21 1754 05/31/21 0416 06/01/21 0316  NA 129* 130* 130* 132* 139  --   --  134*  --  135 136  K 4.7 4.8 3.9 4.1 3.5  --   --  3.8  --  3.8 4.3  CL 92* 96*  --   --  102  --   --  100  --  100 99  CO2 21*  --   --   --  23  --   --  22  --  21* 24  GLUCOSE 777* >700*  --   --  143*  --   --  251*  --  244* 130*  BUN 58* 72*  --   --  64*  --   --  43*  --  64* 28*  CREATININE 9.02* 9.60*  --   --  10.04*  --   --  7.47*  --  8.88* 5.47*  CALCIUM 8.1*  --   --   --  8.4*  --   --  7.6*  --  7.7* 8.1*   PHOS  --   --   --   --   --  2.8 3.2 4.4 4.2 4.3 4.5   CBC Recent Labs  Lab 05/28/21 1157 05/28/21 1206 05/29/21 0539 05/30/21 0445 05/31/21 0416 06/01/21 0314  WBC 8.0  --  8.4 8.4 7.9 12.9*  NEUTROABS 7.1  --   --   --  5.6  --   HGB 11.9*   < > 10.7* 11.5* 10.8* 11.3*  HCT 35.2*   < > 30.8* 32.1* 32.0* 33.7*  MCV 91.7  --  89.5 89.7 91.7 92.6  PLT 146*  --  130* 127* 134* 145*   < > = values in this interval not displayed.      Medications:     amLODipine  5 mg Oral Daily  aspirin  81 mg Per Tube Daily   atorvastatin  40 mg Per Tube QHS   chlorhexidine gluconate (MEDLINE KIT)  15 mL Mouth Rinse BID   Chlorhexidine Gluconate Cloth  6 each Topical Daily   cholecalciferol  1,000 Units Per Tube Daily   docusate  100 mg Per Tube BID   heparin injection (subcutaneous)  5,000 Units Subcutaneous Q8H   hydrALAZINE  25 mg Per Tube Q8H   insulin aspart  0-15 Units Subcutaneous Q4H   insulin glargine-yfgn  32 Units Subcutaneous Daily   latanoprost  1 drop Both Eyes QHS   LORazepam  2 mg Intravenous Once   mouth rinse  15 mL Mouth Rinse 10 times per day   mupirocin ointment  1 application Nasal BID   pantoprazole sodium  40 mg Per Tube Q24H   polyethylene glycol  17 g Per Tube Daily   sodium chloride flush  10-40 mL Intracatheter Q12H     Madelon Lips MD 06/01/2021, 10:18 AM

## 2021-06-01 NOTE — Progress Notes (Addendum)
Nutrition Follow-up  DOCUMENTATION CODES:   Non-severe (moderate) malnutrition in context of chronic illness  INTERVENTION:   -Renal MVI daily -Nepro Shake po BID, each supplement provides 425 kcal and 19 grams protein  -Magic cup TID with meals, each supplement provides 290 kcal and 9 grams of protein  -Feeding assistance with meals  NUTRITION DIAGNOSIS:   Moderate Malnutrition related to chronic illness (ESRD on HD, DM) as evidenced by mild fat depletion, moderate fat depletion, mild muscle depletion, moderate muscle depletion.  Ongoing  GOAL:   Patient will meet greater than or equal to 90% of their needs  Progressing   MONITOR:   PO intake, Supplement acceptance, Diet advancement, Labs, Weight trends, Skin, I & O's  REASON FOR ASSESSMENT:   Consult Enteral/tube feeding initiation and management  ASSESSMENT:   75 y.o. male with PMHx of ESRD on hemodialysis, DM2, and HTN who was found down with AMS. Pt presents in DKA and abnormal posturing of the whole body concerning for generalized tonic-clonic seizure. Initially given Ativan and Keppra for seizure control and intubated for airway protection.  8/15- extubated 8/16- s/p BSE- advanced to dysphagia 2 diet with thin liquids  Reviewed I/O's: -804 ml x 24 hours and +1.1 L since admission  UOP: 675 ml x 24 hours  Stool output: 500 ml x 24 hours  Pt not very communicative at time of visit. Pt greeted RD, but did not answer further questions. Pt winced when completing nutrition-focused physical exam, however, denies pain.   No meal completion data currently available at time of visit.   Per nephrology notes, last HD 05/31/21. Plan to continue with MWF schedule.   Medications reviewed and include vitamin D3.    Labs reviewed: CBGS: 76-149 (inpatient orders for glycemic control are 0-15 units insulin aspart every 4 hours).   NUTRITION - FOCUSED PHYSICAL EXAM:  Flowsheet Row Most Recent Value  Orbital Region Mild  depletion  Upper Arm Region No depletion  Thoracic and Lumbar Region No depletion  Buccal Region Moderate depletion  Temple Region Moderate depletion  Clavicle Bone Region Mild depletion  Clavicle and Acromion Bone Region Mild depletion  Scapular Bone Region Mild depletion  Dorsal Hand No depletion  Patellar Region Mild depletion  Anterior Thigh Region Mild depletion  Posterior Calf Region Mild depletion  Edema (RD Assessment) Mild  Hair Reviewed  Eyes Reviewed  Mouth Reviewed  Skin Reviewed  Nails Reviewed       Diet Order:   Diet Order             DIET DYS 2 Room service appropriate? Yes; Fluid consistency: Thin  Diet effective now                   EDUCATION NEEDS:   No education needs have been identified at this time  Skin:  Skin Assessment: Reviewed RN Assessment  Last BM:  Unknown  Height:   Ht Readings from Last 1 Encounters:  05/29/21 '5\' 10"'$  (1.778 m)    Weight:   Wt Readings from Last 1 Encounters:  05/31/21 71.8 kg    Ideal Body Weight:  75.45 kg  BMI:  Body mass index is 22.71 kg/m.  Estimated Nutritional Needs:   Kcal:  2150-2350  Protein:  105-120 grams  Fluid:  1000 ml + UOP    Loistine Chance, RD, LDN, [place] Registered Dietitian II Certified Diabetes Care and Education Specialist Please refer to [facility] for RD and/or RD on-call/weekend/after hours pager

## 2021-06-02 ENCOUNTER — Inpatient Hospital Stay (HOSPITAL_COMMUNITY): Payer: Medicare Other

## 2021-06-02 ENCOUNTER — Encounter (HOSPITAL_COMMUNITY): Payer: Self-pay | Admitting: Pulmonary Disease

## 2021-06-02 DIAGNOSIS — R509 Fever, unspecified: Secondary | ICD-10-CM

## 2021-06-02 DIAGNOSIS — B9562 Methicillin resistant Staphylococcus aureus infection as the cause of diseases classified elsewhere: Secondary | ICD-10-CM

## 2021-06-02 DIAGNOSIS — E1310 Other specified diabetes mellitus with ketoacidosis without coma: Secondary | ICD-10-CM | POA: Diagnosis not present

## 2021-06-02 DIAGNOSIS — I639 Cerebral infarction, unspecified: Secondary | ICD-10-CM

## 2021-06-02 DIAGNOSIS — I669 Occlusion and stenosis of unspecified cerebral artery: Secondary | ICD-10-CM

## 2021-06-02 DIAGNOSIS — N186 End stage renal disease: Secondary | ICD-10-CM | POA: Diagnosis not present

## 2021-06-02 DIAGNOSIS — R7881 Bacteremia: Secondary | ICD-10-CM

## 2021-06-02 DIAGNOSIS — I76 Septic arterial embolism: Secondary | ICD-10-CM | POA: Diagnosis not present

## 2021-06-02 LAB — GLUCOSE, CAPILLARY
Glucose-Capillary: 66 mg/dL — ABNORMAL LOW (ref 70–99)
Glucose-Capillary: 72 mg/dL (ref 70–99)
Glucose-Capillary: 192 mg/dL — ABNORMAL HIGH (ref 70–99)
Glucose-Capillary: 180 mg/dL — ABNORMAL HIGH (ref 70–99)
Glucose-Capillary: 199 mg/dL — ABNORMAL HIGH (ref 70–99)
Glucose-Capillary: 142 mg/dL — ABNORMAL HIGH (ref 70–99)
Glucose-Capillary: 275 mg/dL — ABNORMAL HIGH (ref 70–99)
Glucose-Capillary: 178 mg/dL — ABNORMAL HIGH (ref 70–99)

## 2021-06-02 LAB — PROCALCITONIN: Procalcitonin: 5.92 ng/mL

## 2021-06-02 LAB — COMPREHENSIVE METABOLIC PANEL
ALT: 63 U/L — ABNORMAL HIGH (ref 0–44)
AST: 74 U/L — ABNORMAL HIGH (ref 15–41)
Albumin: 2.6 g/dL — ABNORMAL LOW (ref 3.5–5.0)
Alkaline Phosphatase: 90 U/L (ref 38–126)
Anion gap: 15 (ref 5–15)
BUN: 56 mg/dL — ABNORMAL HIGH (ref 8–23)
CO2: 22 mmol/L (ref 22–32)
Calcium: 8.3 mg/dL — ABNORMAL LOW (ref 8.9–10.3)
Chloride: 98 mmol/L (ref 98–111)
Creatinine, Ser: 7.61 mg/dL — ABNORMAL HIGH (ref 0.61–1.24)
GFR, Estimated: 7 mL/min — ABNORMAL LOW (ref >60)
Glucose, Bld: 157 mg/dL — ABNORMAL HIGH (ref 70–99)
Potassium: 4.5 mmol/L (ref 3.5–5.1)
Sodium: 135 mmol/L (ref 135–145)
Total Bilirubin: 0.8 mg/dL (ref 0.3–1.2)
Total Protein: 6.7 g/dL (ref 6.5–8.1)

## 2021-06-02 LAB — BLOOD CULTURE ID PANEL (REFLEXED) - BCID2

## 2021-06-02 LAB — CBC WITH DIFFERENTIAL/PLATELET
Abs Immature Granulocytes: 0.16 10*3/uL — ABNORMAL HIGH (ref 0.00–0.07)
Basophils Absolute: 0 10*3/uL (ref 0.0–0.1)
Basophils Relative: 0 %
Eosinophils Absolute: 0.3 10*3/uL (ref 0.0–0.5)
Eosinophils Relative: 2 %
HCT: 34.6 % — ABNORMAL LOW (ref 39.0–52.0)
Hemoglobin: 11.9 g/dL — ABNORMAL LOW (ref 13.0–17.0)
Immature Granulocytes: 1 %
Lymphocytes Relative: 5 %
Lymphs Abs: 0.6 10*3/uL — ABNORMAL LOW (ref 0.7–4.0)
MCH: 31.8 pg (ref 26.0–34.0)
MCHC: 34.4 g/dL (ref 30.0–36.0)
MCV: 92.5 fL (ref 80.0–100.0)
Monocytes Absolute: 1.9 10*3/uL — ABNORMAL HIGH (ref 0.1–1.0)
Monocytes Relative: 14 %
Neutro Abs: 10.4 10*3/uL — ABNORMAL HIGH (ref 1.7–7.7)
Neutrophils Relative %: 78 %
Platelets: 157 10*3/uL (ref 150–400)
RBC: 3.74 MIL/uL — ABNORMAL LOW (ref 4.22–5.81)
RDW: 14.5 % (ref 11.5–15.5)
WBC: 13.3 10*3/uL — ABNORMAL HIGH (ref 4.0–10.5)
nRBC: 0 % (ref 0.0–0.2)

## 2021-06-02 LAB — C-REACTIVE PROTEIN: CRP: 33.4 mg/dL — ABNORMAL HIGH (ref <1.0)

## 2021-06-02 LAB — MAGNESIUM: Magnesium: 2.2 mg/dL (ref 1.7–2.4)

## 2021-06-02 IMAGING — MR MR HEAD W/O CM
12 of 13 series · 44 of 48 positions shown · non-contrast
Comparison: [DATE]

CLINICAL DATA: Acute neurologic deficit

EXAM:
MRI HEAD WITHOUT CONTRAST
TECHNIQUE: Multiplanar, multiecho pulse sequences of the brain and surrounding
structures were obtained without intravenous contrast.

[Series 5: DWI · axial · 3.0mm · 0.88mm/px · z∈[-130,+25]mm · 8 of 110 slices shown (1 of 4)]
[im 1/110]
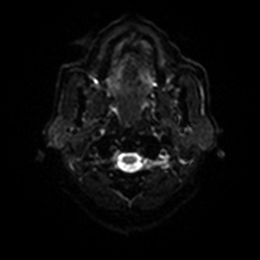
[im 16/110]
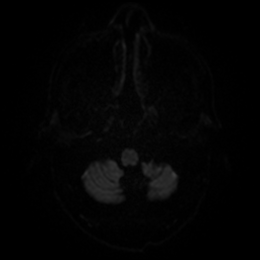
[im 32/110]
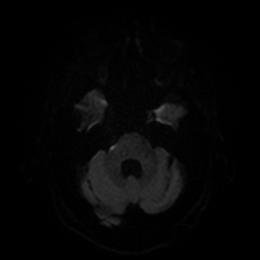
[im 47/110]
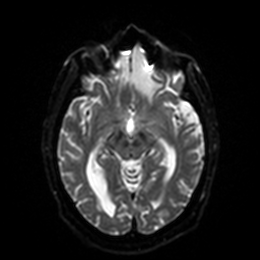
[im 63/110]
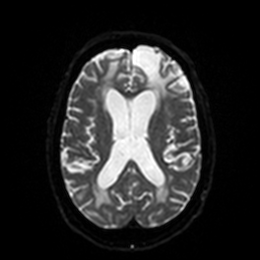
[im 78/110]
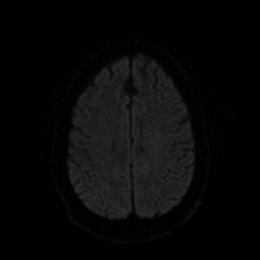
[im 94/110]
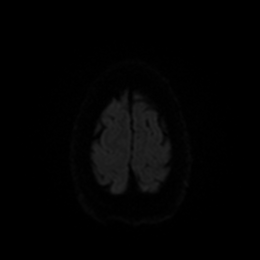
[im 110/110]
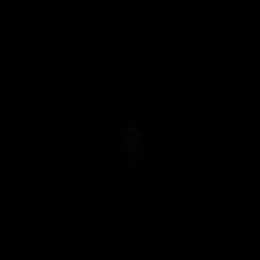

[Series 6: DWI · axial · 3.0mm · 0.88mm/px · z∈[-130,+25]mm · 4 of 54 slices shown (2 of 4)]
[im 1/54]
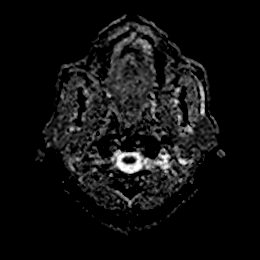
[im 18/54]
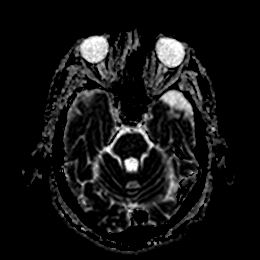
[im 36/54]
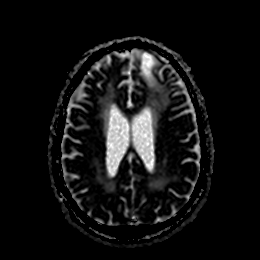
[im 54/54]
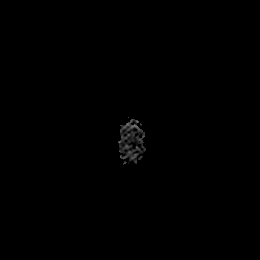

[Series 7: DWI · coronal · 4.0mm · 0.88mm/px · 5 of 72 slices shown (3 of 4)]
[im 1/72]
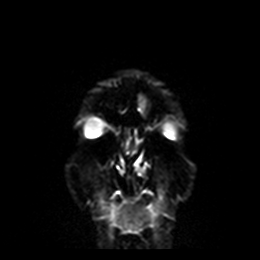
[im 18/72]
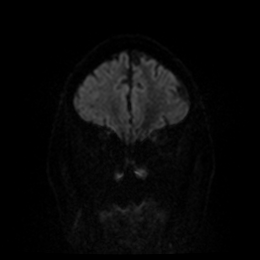
[im 36/72]
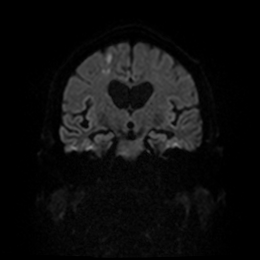
[im 54/72]
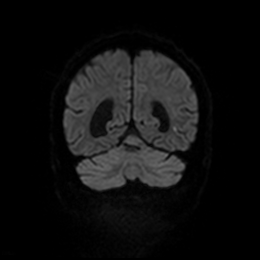
[im 72/72]
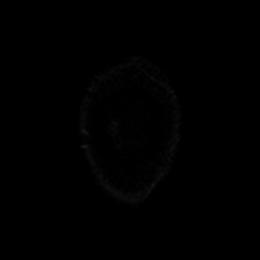

[Series 8: DWI · coronal · 4.0mm · 0.88mm/px · 3 of 36 slices shown (4 of 4)]
[im 1/36]
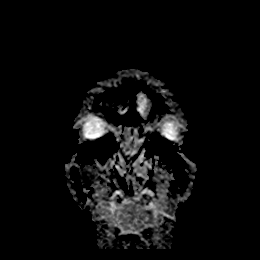
[im 18/36]
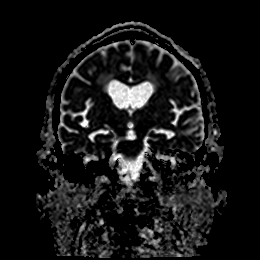
[im 36/36]
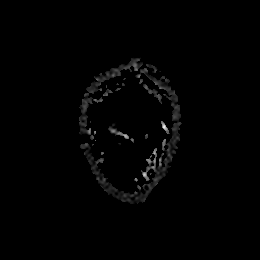

[Series 9: T1 · sagittal · 5.0mm · 0.75mm/px · 2 of 25 slices shown]
[im 1/25]
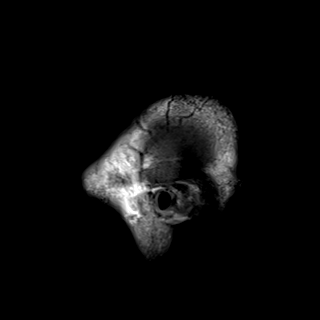
[im 25/25]
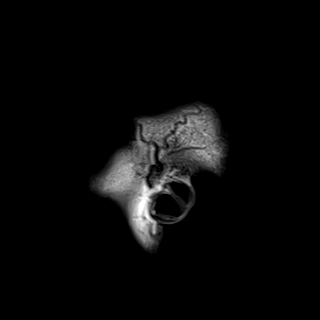

[Series 10: T2 · axial · 5.0mm · 0.72mm/px · z∈[-127,+22]mm · 2 of 27 slices shown (1 of 2)]
[im 1/27]
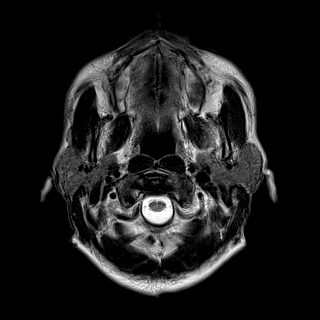
[im 27/27]
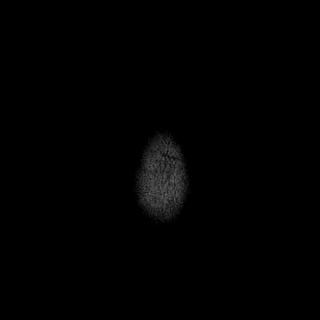

[Series 11: FLAIR · axial · 5.0mm · 0.45mm/px · z∈[-125,+24]mm · 2 of 27 slices shown]
[im 1/27]
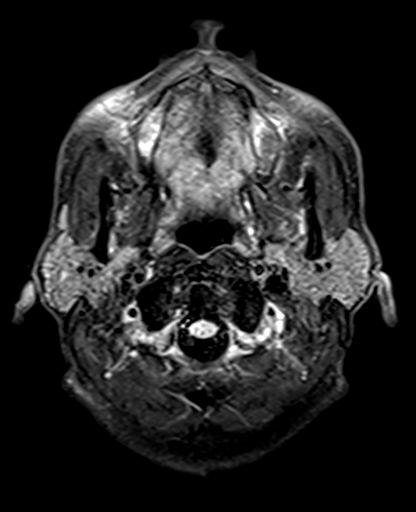
[im 27/27]
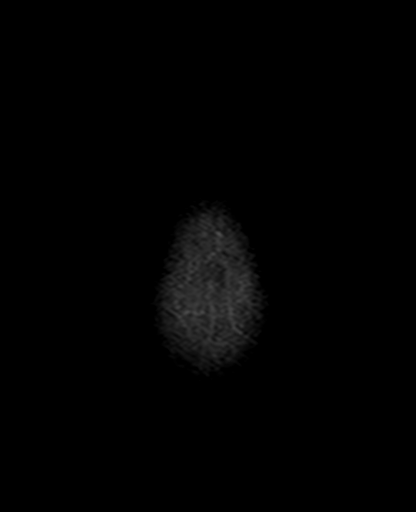

[Series 12: mag_images · axial · 3.0mm · 0.90mm/px · z∈[-130,+28]mm · 4 of 56 slices shown]
[im 1/56]
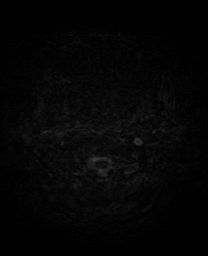
[im 19/56]
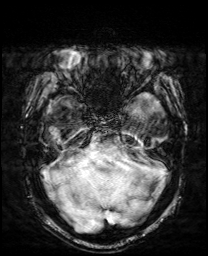
[im 37/56]
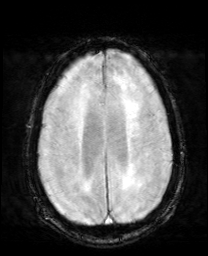
[im 56/56]
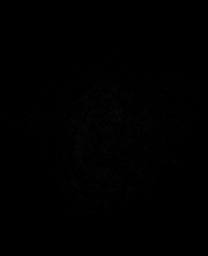

[Series 13: pha_images · axial · 3.0mm · 0.90mm/px · z∈[-127,+14]mm · 4 of 50 slices shown]
[im 1/50]
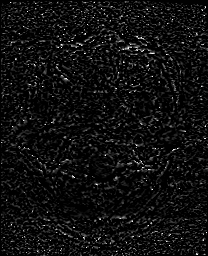
[im 17/50]
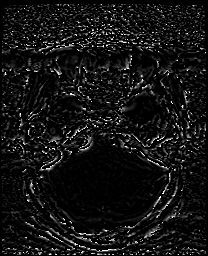
[im 33/50]
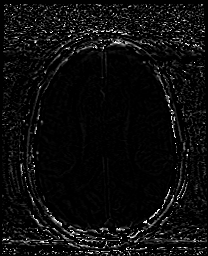
[im 50/50]
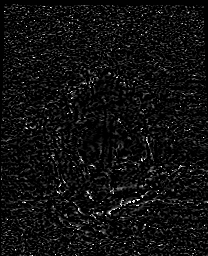

[Series 14: swi_images · axial · 3.0mm · 0.90mm/px · z∈[-130,+28]mm · 4 of 56 slices shown]
[im 1/56]
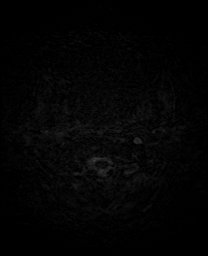
[im 19/56]
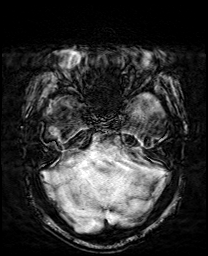
[im 37/56]
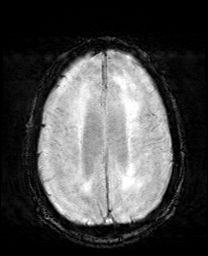
[im 56/56]
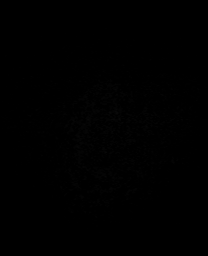

[Series 15: mip_images(sw) · axial · 24.0mm · 0.90mm/px · z∈[-120,+18]mm · 4 of 49 slices shown]
[im 1/49]
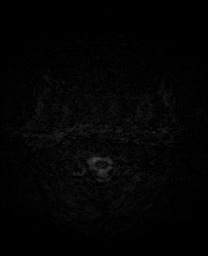
[im 17/49]
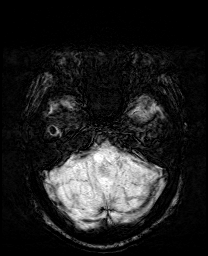
[im 33/49]
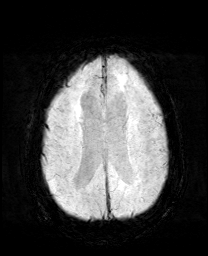
[im 49/49]
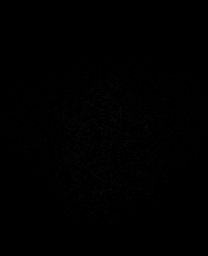

[Series 17: T2 · coronal · 5.0mm · 0.34mm/px · 2 of 29 slices shown (2 of 2)]
[im 1/29]
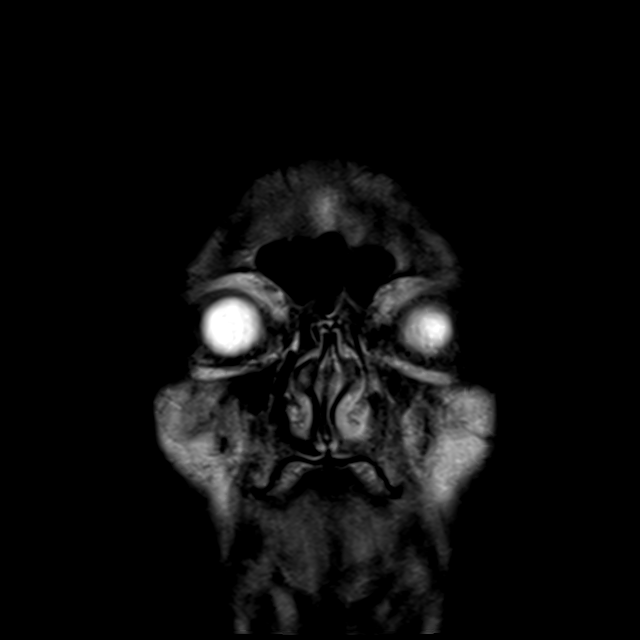
[im 29/29]
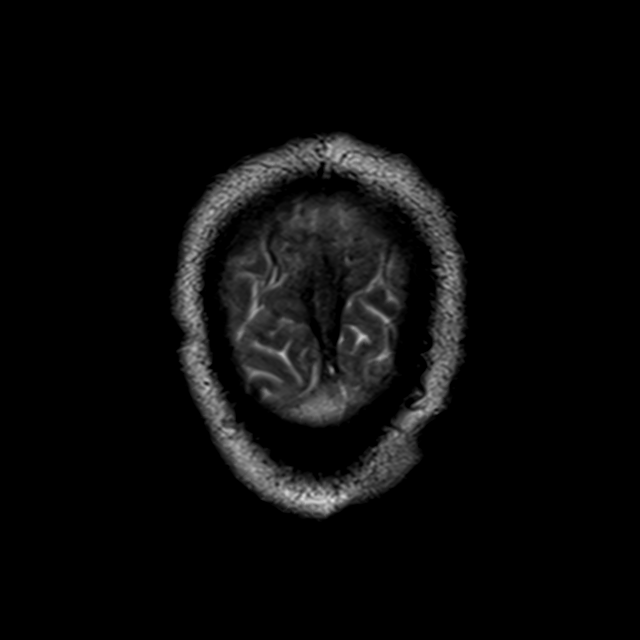

[44 of 48 positions shown; findings below may reference images not displayed]

FINDINGS: Brain: Unchanged multifocal punctate early subacute ischemia within
both hemispheres. No acute or chronic hemorrhage. Hyperintense
T2-weighted signal is moderately widespread throughout the white
matter. Advanced atrophy for age. There is encephalomalacia of both
frontal lobes and the left temporal lobe. The midline structures are
normal.

Vascular: Major flow voids are preserved.

Skull and upper cervical spine: Normal calvarium and skull base.
Visualized upper cervical spine and soft tissues are normal.

Sinuses/Orbits:No paranasal sinus fluid levels or advanced mucosal
thickening. No mastoid or middle ear effusion. Normal orbits.
IMPRESSION: 1. Unchanged multifocal punctate early subacute ischemia within both
hemispheres. No hemorrhage or mass effect.
2. Advanced atrophy and findings of chronic ischemic
microangiopathy.
3. Posttraumatic encephalomalacia of the frontal lobes and left
temporal lobe.

## 2021-06-02 MED ORDER — HEPARIN SODIUM (PORCINE) 1000 UNIT/ML IJ SOLN
1000.0000 [IU] | INTRAMUSCULAR | Status: DC | PRN
  Filled 2021-06-02: qty 1

## 2021-06-02 MED ORDER — HEPARIN SODIUM (PORCINE) 1000 UNIT/ML IJ SOLN
INTRAMUSCULAR | Status: AC
  Administered 2021-06-02: 3800 [IU]
  Filled 2021-06-02: qty 4

## 2021-06-02 MED ORDER — OXYCODONE HCL 5 MG PO TABS
5.0000 mg | ORAL_TABLET | Freq: Three times a day (TID) | ORAL | Status: DC | PRN
  Administered 2021-06-02 – 2021-06-07 (×4): 5 mg via ORAL
  Filled 2021-06-02 (×4): qty 1

## 2021-06-02 MED ORDER — DILTIAZEM HCL ER COATED BEADS 180 MG PO CP24
180.0000 mg | ORAL_CAPSULE | Freq: Every day | ORAL | Status: DC
  Administered 2021-06-02 – 2021-06-08 (×7): 180 mg via ORAL
  Filled 2021-06-02 (×7): qty 1

## 2021-06-02 NOTE — Progress Notes (Signed)
PHARMACY - PHYSICIAN COMMUNICATION CRITICAL VALUE ALERT - BLOOD CULTURE IDENTIFICATION (BCID)  Assessment:  Jonathan Kane is a 75 y.o. male admitted on 05/28/2021 with HHS/HTN emergency, found unresponsive with new seizures. PMH significant for ESRD on HD MWF and is tolerating full dialysis sessions.  Patient 8/16 had increased WBC 12.9 and Tmax 102.9 with unknown source for which vanc/zosyn was started. Notably, patient has an AV fistula and is currently using an HD catheter for HD MWF. Further evaluation for source per ID team.  Pt remains leukocytotic 8/17 (WBC 13.3), and febrile (Tm 101.3). 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   Name of physician (or Provider) Contacted: Dr. Mal Misty   Current antibiotics:  Vancomycin 750 mg IV MWF with HD Zosyn 2.25g IV Q8H    Changes to prescribed antibiotics recommended:  STOP Zosyn  Continue Vancomycin 750 mg IV MWF with HD    Results for orders placed or performed during the hospital encounter of 05/28/21  Blood Culture ID Panel (Reflexed) (Collected: 06/01/2021 11:29 AM)  Result Value Ref Range   Enterococcus faecalis NOT DETECTED NOT DETECTED   Enterococcus Faecium NOT DETECTED NOT DETECTED   Listeria monocytogenes NOT DETECTED NOT DETECTED   Staphylococcus species DETECTED (A) NOT DETECTED   Staphylococcus aureus (BCID) DETECTED (A) NOT DETECTED   Staphylococcus epidermidis NOT DETECTED NOT DETECTED   Staphylococcus lugdunensis NOT DETECTED NOT DETECTED   Streptococcus species NOT DETECTED NOT DETECTED   Streptococcus agalactiae NOT DETECTED NOT DETECTED   Streptococcus pneumoniae NOT DETECTED NOT DETECTED   Streptococcus pyogenes NOT DETECTED NOT DETECTED   A.calcoaceticus-baumannii NOT DETECTED NOT DETECTED   Bacteroides fragilis NOT DETECTED NOT DETECTED   Enterobacterales NOT DETECTED NOT DETECTED   Enterobacter cloacae complex NOT DETECTED NOT DETECTED   Escherichia coli NOT  DETECTED NOT DETECTED   Klebsiella aerogenes NOT DETECTED NOT DETECTED   Klebsiella oxytoca NOT DETECTED NOT DETECTED   Klebsiella pneumoniae NOT DETECTED NOT DETECTED   Proteus species NOT DETECTED NOT DETECTED   Salmonella species NOT DETECTED NOT DETECTED   Serratia marcescens NOT DETECTED NOT DETECTED   Haemophilus influenzae NOT DETECTED NOT DETECTED   Neisseria meningitidis NOT DETECTED NOT DETECTED   Pseudomonas aeruginosa NOT DETECTED NOT DETECTED   Stenotrophomonas maltophilia NOT DETECTED NOT DETECTED   Candida albicans NOT DETECTED NOT DETECTED   Candida auris NOT DETECTED NOT DETECTED   Candida glabrata NOT DETECTED NOT DETECTED   Candida krusei NOT DETECTED NOT DETECTED   Candida parapsilosis NOT DETECTED NOT DETECTED   Candida tropicalis NOT DETECTED NOT DETECTED   Cryptococcus neoformans/gattii NOT DETECTED NOT DETECTED   Meth resistant mecA/C and MREJ DETECTED (A) NOT DETECTED    Adria Dill, PharmD PGY-1 Acute Care Resident  06/02/2021 12:52 PM

## 2021-06-02 NOTE — Progress Notes (Signed)
Progress Note    Jonathan Kane  [id number] DOB: 1946/04/26  DOA: 05/28/2021 PCP: Cipriano Mile, NP      Brief Narrative:    Medical records reviewed and are as summarized below:  Jonathan Kane is a 75 y.o. male with medical history significant for type II DM, ESRD on hemodialysis, hypertension, substance use disorder, alcohol use disorder, hepatitis C infection, HSV infection, BPH.  He was brought to the hospital because of unresponsiveness and was found to have severe hyperglycemia and hypertensive emergency.  He developed new onset seizures with status epilepticus.  He was intubated and placed on mechanical ventilation.    Neurologist was consulted to assist with management.  He was started on Keppra.      Assessment/Plan:   Active Problems:   DKA (diabetic ketoacidosis) ([place])   Status epilepticus (H[facility])   Malnutrition of moderate degree   Nutrition Problem: Moderate Malnutrition Etiology: chronic illness (ESRD on HD, DM)  Signs/Symptoms: mild fat depletion, moderate fat depletion, mild muscle depletion, moderate muscle depletion   Body mass index is 19.64 kg/m.   Acute metabolic encephalopathy, possible subacute stroke in both cerebral hemispheres: Continue aspirin, Lipitor.  PT recommends discharge to inpatient rehab.  Hypertension with recent hypertensive emergency: Continue antihypertensives.  2D echo showed EF estimated at 60 to 65%, severe LVH, grade 1 diastolic dysfunction  New seizures with status epilepticus: Continue Keppra.  Neurologist recommends discontinue Keppra at discharge.  Video EEG did not show any seizures or definitive epileptiform discharges.  There were changes suggestive of cortical dysfunction in the right hemisphere.  MRSA sepsis secondary to MRSA bacteremia: Procalcitonin was 5.92.  1 out of 4 culture bottles positive for MRSA.  De-escalate empiric antibiotics to IV vancomycin (discontinue Zosyn).  Consult ID to assist with  management.  ESRD: Follow-up with nephrologist for hemodialysis.  IDDM with hyperglycemia: Hemoglobin A1c is 11.5.  He was hypoglycemic this morning with glucose of 66 at 3:10 AM.  Continue insulin glargine and NovoLog.  Monitor glucose levels closely.  Acute hypoxic respiratory failure: S/p extubation on 05/31/2021.  He is tolerating room air.  Diet Order             DIET DYS 2 Room service appropriate? Yes; Fluid consistency: Thin  Diet effective now                      Consultants: Neurologist Nephrologist Intensivist  Procedures: Video EEG    Medications:    amLODipine  5 mg Oral Daily   aspirin  81 mg Oral Daily   atorvastatin  40 mg Oral QHS   Chlorhexidine Gluconate Cloth  6 each Topical Daily   cholecalciferol  1,000 Units Oral Daily   diltiazem  180 mg Oral Daily   feeding supplement (NEPRO CARB STEADY)  237 mL Oral BID BM   heparin injection (subcutaneous)  5,000 Units Subcutaneous Q8H   heparin sodium (porcine)       hydrALAZINE  25 mg Oral Q8H   insulin aspart  0-15 Units Subcutaneous Q4H   insulin glargine-yfgn  20 Units Subcutaneous Daily   latanoprost  1 drop Both Eyes QHS   multivitamin  1 tablet Oral QHS   sodium chloride flush  10-40 mL Intracatheter Q12H   Continuous Infusions:  sodium chloride Stopped (05/31/21 1252)   piperacillin-tazobactam (ZOSYN)  IV Stopped (06/02/21 0400)   vancomycin       Anti-infectives (From admission, onward)    Start  Dose/Rate Route Frequency Ordered Stop   06/02/21 1200  vancomycin (VANCOCIN) IVPB 750 mg/150 ml premix        750 mg 150 mL/hr over 60 Minutes Intravenous Every M-W-F (Hemodialysis) 06/01/21 1144     06/01/21 1230  piperacillin-tazobactam (ZOSYN) IVPB 2.25 g        2.25 g 100 mL/hr over 30 Minutes Intravenous Every 8 hours 06/01/21 1134     06/01/21 1200  metroNIDAZOLE (FLAGYL) IVPB 500 mg  Status:  Discontinued        500 mg 100 mL/hr over 60 Minutes Intravenous Every 8 hours  06/01/21 1103 06/01/21 1125   06/01/21 1200  vancomycin (VANCOREADY) IVPB 1500 mg/300 mL        1,500 mg 150 mL/hr over 120 Minutes Intravenous  Once 06/01/21 1103 06/01/21 1549   06/01/21 1200  piperacillin-tazobactam (ZOSYN) IVPB 2.25 g  Status:  Discontinued        2.25 g 100 mL/hr over 30 Minutes Intravenous Every 8 hours 06/01/21 1125 06/01/21 1134              Family Communication/Anticipated D/C date and plan/Code Status   DVT prophylaxis: heparin injection 5,000 Units Start: 05/28/21 2200     Code Status: Full Code  Family Communication: None Disposition Plan:    Status is: Inpatient  Remains inpatient appropriate because:IV treatments appropriate due to intensity of illness or inability to take PO and Inpatient level of care appropriate due to severity of illness  Dispo: The patient is from: Home              Anticipated d/c is to: [facility]              Patient currently is not medically stable to d/c.   Difficult to place patient No           Subjective:   Interval events noted.  No chest pain, shortness of breath, nausea or vomiting.  Dialysis in progress and dialysis nurse was at the bedside.  Objective:    Vitals:   06/02/21 1115 06/02/21 1123 06/02/21 1130 06/02/21 1145  BP: (!) 155/84  (!) 152/84 (!) 154/87  Pulse: 95  93 93  Resp: (!) '21  17 15  '$ Temp:  99.1 F (37.3 C)    TempSrc:  Oral    SpO2: 97%  96% 96%  Weight:      Height:       No data found.   Intake/Output Summary (Last 24 hours) at 06/02/2021 1158 Last data filed at 06/02/2021 0800 Gross per 24 hour  Intake 930.63 ml  Output 775 ml  Net 155.63 ml   Filed Weights   05/31/21 1800 06/02/21 0500 06/02/21 1030  Weight: 71.8 kg 69.2 kg 62.1 kg    Exam:  GEN: NAD SKIN: No rash EYES: EOMI ENT: MMM CV: RRR PULM: CTA B ABD: soft, ND, NT, +BS CNS: AAO x 3, non focal EXT: No edema or tenderness        Data Reviewed:   I have personally reviewed following labs  and imaging studies:  Labs: Labs show the following:   Basic Metabolic Panel: Recent Labs  Lab 05/29/21 1326 05/29/21 1710 05/30/21 0445 05/30/21 1754 05/31/21 0416 06/01/21 0316 06/01/21 1147 06/02/21 0041  NA  --   --  134*  --  135 136 135 135  K  --   --  3.8  --  3.8 4.3 4.4 4.5  CL  --   --  100  --  100 99 97* 98  CO2  --   --  22  --  21* '24 23 22  '$ GLUCOSE  --   --  251*  --  244* 130* 157* 157*  BUN  --   --  43*  --  64* 28* 35* 56*  CREATININE  --   --  7.47*  --  8.88* 5.47* 6.53* 7.61*  CALCIUM  --   --  7.6*  --  7.7* 8.1* 8.0* 8.3*  MG 2.0 2.0 2.2 2.3  --   --   --  2.2  PHOS 2.8 3.2 4.4 4.2 4.3 4.5  --   --    GFR Estimated Creatinine Clearance: 7.6 mL/min (A) (by C-G formula based on SCr of 7.61 mg/dL (H)). Liver Function Tests: Recent Labs  Lab 05/28/21 1157 05/30/21 0445 05/31/21 0416 06/01/21 0316 06/01/21 1147 06/02/21 0041  AST 36 19  --   --  88* 74*  ALT 20 15  --   --  52* 63*  ALKPHOS 69 47  --   --  70 90  BILITOT 0.8 0.5  --   --  0.9 0.8  PROT 7.4 6.0*  --   --  6.5 6.7  ALBUMIN 4.1 2.9* 2.7* 2.8* 2.7* 2.6*   No results for input(s): LIPASE, AMYLASE in the last 168 hours. No results for input(s): AMMONIA in the last 168 hours. Coagulation profile Recent Labs  Lab 05/28/21 1157 06/01/21 1147  INR 1.1 1.3*    CBC: Recent Labs  Lab 05/28/21 1157 05/28/21 1206 05/30/21 0445 05/31/21 0416 06/01/21 0314 06/01/21 1147 06/02/21 0041  WBC 8.0   < > 8.4 7.9 12.9* 13.0* 13.3*  NEUTROABS 7.1  --   --  5.6  --  11.1* 10.4*  HGB 11.9*   < > 11.5* 10.8* 11.3* 11.0* 11.9*  HCT 35.2*   < > 32.1* 32.0* 33.7* 33.2* 34.6*  MCV 91.7   < > 89.7 91.7 92.6 93.0 92.5  PLT 146*   < > 127* 134* 145* 150 157   < > = values in this interval not displayed.   Cardiac Enzymes: No results for input(s): CKTOTAL, CKMB, CKMBINDEX, TROPONINI in the last 168 hours. BNP (last 3 results) No results for input(s): PROBNP in the last 8760  hours. CBG: Recent Labs  Lab 06/02/21 0310 06/02/21 0314 06/02/21 0710 06/02/21 0932 06/02/21 1123  GLUCAP 66* 72 192* 180* 199*   D-Dimer: No results for input(s): DDIMER in the last 72 hours. Hgb A1c: Recent Labs    06/01/21 0314  HGBA1C 11.5*   Lipid Profile: Recent Labs    05/31/21 0852 06/01/21 0316 06/01/21 0317  CHOL 79 89  --   HDL 39* 32*  --   LDLCALC 14 37  --   TRIG 132 100 103  CHOLHDL 2.0 2.8  --    Thyroid function studies: No results for input(s): TSH, T4TOTAL, T3FREE, THYROIDAB in the last 72 hours.  Invalid input(s): FREET3 Anemia work up: No results for input(s): VITAMINB12, FOLATE, FERRITIN, TIBC, IRON, RETICCTPCT in the last 72 hours. Sepsis Labs: Recent Labs  Lab 05/31/21 0416 06/01/21 0314 06/01/21 1129 06/01/21 1147 06/01/21 1402 06/02/21 0041  PROCALCITON  --   --   --   --   --  5.92  WBC 7.9 12.9*  --  13.0*  --  13.3*  LATICACIDVEN  --   --  1.0  --  1.0  --  Microbiology Recent Results (from the past 240 hour(s))  Resp Panel by RT-PCR (Flu A&B, Covid) Nasopharyngeal Swab     Status: None   Collection Time: 05/28/21  1:09 PM   Specimen: Nasopharyngeal Swab; Nasopharyngeal(NP) swabs in vial transport medium  Result Value Ref Range Status   SARS Coronavirus 2 by RT PCR NEGATIVE NEGATIVE Final    Comment: (NOTE) SARS-CoV-2 target nucleic acids are NOT DETECTED.  The SARS-CoV-2 RNA is generally detectable in upper respiratory specimens during the acute phase of infection. The lowest concentration of SARS-CoV-2 viral copies this assay can detect is 138 copies/mL. A negative result does not preclude SARS-Cov-2 infection and should not be used as the sole basis for treatment or other patient management decisions. A negative result may occur with  improper specimen collection/handling, submission of specimen other than nasopharyngeal swab, presence of viral mutation(s) within the areas targeted by this assay, and inadequate  number of viral copies(<138 copies/mL). A negative result must be combined with clinical observations, patient history, and epidemiological information. The expected result is Negative.  Fact Sheet for Patients:  EntrepreneurPulse.com.au  Fact Sheet for Healthcare Providers:  IncredibleEmployment.be  This test is no t yet approved or cleared by the [place] FDA and  has been authorized for detection and/or diagnosis of SARS-CoV-2 by FDA under an Emergency Use Authorization (EUA). This EUA will remain  in effect (meaning this test can be used) for the duration of the COVID-19 declaration under Section 564(b)(1) of the Act, 21 U.S.C.section 360bbb-3(b)(1), unless the authorization is terminated  or revoked sooner.       Influenza A by PCR NEGATIVE NEGATIVE Final   Influenza B by PCR NEGATIVE NEGATIVE Final    Comment: (NOTE) The Xpert Xpress SARS-CoV-2/FLU/RSV plus assay is intended as an aid in the diagnosis of influenza from Nasopharyngeal swab specimens and should not be used as a sole basis for treatment. Nasal washings and aspirates are unacceptable for Xpert Xpress SARS-CoV-2/FLU/RSV testing.  Fact Sheet for Patients: EntrepreneurPulse.com.au  Fact Sheet for Healthcare Providers: IncredibleEmployment.be  This test is not yet approved or cleared by the [place] FDA and has been authorized for detection and/or diagnosis of SARS-CoV-2 by FDA under an Emergency Use Authorization (EUA). This EUA will remain in effect (meaning this test can be used) for the duration of the COVID-19 declaration under Section 564(b)(1) of the Act, 21 U.S.C. section 360bbb-3(b)(1), unless the authorization is terminated or revoked.  Performed at [facility], [place] [address], [place], [place] [address]   MRSA Next Gen by PCR, Nasal     Status: Abnormal   Collection Time: 05/28/21  4:55 PM   Specimen: Nasal  Mucosa; Nasal Swab  Result Value Ref Range Status   MRSA by PCR Next Gen DETECTED (A) NOT DETECTED Final    Comment: RESULT CALLED TO, READ BACK BY AND VERIFIED WITH: B Tora Kindred RN 1921 05/28/21 A BROWNING (NOTE) The GeneXpert MRSA Assay (FDA approved for NASAL specimens only), is one component of a comprehensive MRSA colonization surveillance program. It is not intended to diagnose MRSA infection nor to guide or monitor treatment for MRSA infections. Test performance is not FDA approved in patients less than 72 years old. Performed at [facility], [place] [address], [place], [place] [address]   Culture, blood (x 2)     Status: None (Preliminary result)   Collection Time: 06/01/21 11:29 AM   Specimen: BLOOD RIGHT ARM  Result Value Ref Range Status  Specimen Description BLOOD RIGHT ARM  Final   Special Requests   Final    BOTTLES DRAWN AEROBIC AND ANAEROBIC Blood Culture adequate volume   Culture  Setup Time   Final    GRAM POSITIVE COCCI IN CLUSTERS AEROBIC BOTTLE ONLY CRITICAL RESULT CALLED TO, READ BACK BY AND VERIFIED WITH: pharmd Seward Meth 06/02/2021 '@1056'$  by Blima Rich Performed at [facility], [place] [address], [place], [place] [address]    Culture GRAM POSITIVE COCCI  Final   Report Status PENDING  Incomplete  Blood Culture ID Panel (Reflexed)     Status: Abnormal   Collection Time: 06/01/21 11:29 AM  Result Value Ref Range Status   Enterococcus faecalis NOT DETECTED NOT DETECTED Final   Enterococcus Faecium NOT DETECTED NOT DETECTED Final   Listeria monocytogenes NOT DETECTED NOT DETECTED Final   Staphylococcus species DETECTED (A) NOT DETECTED Final    Comment: CRITICAL RESULT CALLED TO, READ BACK BY AND VERIFIED WITH: pharmd Seward Meth 06/02/2021 '@1056'$  by Blima Rich    Staphylococcus aureus (BCID) DETECTED (A) NOT DETECTED Final    Comment: Methicillin (oxacillin)-resistant Staphylococcus aureus (MRSA). MRSA is predictably resistant to beta-lactam antibiotics (except  ceftaroline). Preferred therapy is vancomycin unless clinically contraindicated. Patient requires contact precautions if  hospitalized. CRITICAL RESULT CALLED TO, READ BACK BY AND VERIFIED WITH: pharmd Seward Meth 06/02/2021 '@1056'$  by JW    Staphylococcus epidermidis NOT DETECTED NOT DETECTED Final   Staphylococcus lugdunensis NOT DETECTED NOT DETECTED Final   Streptococcus species NOT DETECTED NOT DETECTED Final   Streptococcus agalactiae NOT DETECTED NOT DETECTED Final   Streptococcus pneumoniae NOT DETECTED NOT DETECTED Final   Streptococcus pyogenes NOT DETECTED NOT DETECTED Final   A.calcoaceticus-baumannii NOT DETECTED NOT DETECTED Final   Bacteroides fragilis NOT DETECTED NOT DETECTED Final   Enterobacterales NOT DETECTED NOT DETECTED Final   Enterobacter cloacae complex NOT DETECTED NOT DETECTED Final   Escherichia coli NOT DETECTED NOT DETECTED Final   Klebsiella aerogenes NOT DETECTED NOT DETECTED Final   Klebsiella oxytoca NOT DETECTED NOT DETECTED Final   Klebsiella pneumoniae NOT DETECTED NOT DETECTED Final   Proteus species NOT DETECTED NOT DETECTED Final   Salmonella species NOT DETECTED NOT DETECTED Final   Serratia marcescens NOT DETECTED NOT DETECTED Final   Haemophilus influenzae NOT DETECTED NOT DETECTED Final   Neisseria meningitidis NOT DETECTED NOT DETECTED Final   Pseudomonas aeruginosa NOT DETECTED NOT DETECTED Final   Stenotrophomonas maltophilia NOT DETECTED NOT DETECTED Final   Candida albicans NOT DETECTED NOT DETECTED Final   Candida auris NOT DETECTED NOT DETECTED Final   Candida glabrata NOT DETECTED NOT DETECTED Final   Candida krusei NOT DETECTED NOT DETECTED Final   Candida parapsilosis NOT DETECTED NOT DETECTED Final   Candida tropicalis NOT DETECTED NOT DETECTED Final   Cryptococcus neoformans/gattii NOT DETECTED NOT DETECTED Final   Meth resistant mecA/C and MREJ DETECTED (A) NOT DETECTED Final    Comment: CRITICAL RESULT CALLED TO, READ BACK BY AND  VERIFIED WITH: pharmd Seward Meth 06/02/2021 '@1056'$  by Blima Rich Performed at [facility], [address], [place], [place] [address]   Culture, blood (x 2)     Status: None (Preliminary result)   Collection Time: 06/01/21 11:47 AM   Specimen: BLOOD  Result Value Ref Range Status   Specimen Description BLOOD BLOOD RIGHT HAND  Final   Special Requests   Final    BOTTLES DRAWN AEROBIC AND ANAEROBIC Blood Culture results may not be optimal due to an inadequate volume of blood  received in culture bottles   Culture   Final    NO GROWTH < 24 HOURS Performed at [facility], [place] [address], [place], [place] [address]    Report Status PENDING  Incomplete    Procedures and diagnostic studies:  MR BRAIN WO CONTRAST  Result Date: 06/02/2021 CLINICAL DATA:  Acute neurologic deficit EXAM: MRI HEAD WITHOUT CONTRAST TECHNIQUE: Multiplanar, multiecho pulse sequences of the brain and surrounding structures were obtained without intravenous contrast. COMPARISON:  05/30/2021 FINDINGS: Brain: Unchanged multifocal punctate early subacute ischemia within both hemispheres. No acute or chronic hemorrhage. Hyperintense T2-weighted signal is moderately widespread throughout the white matter. Advanced atrophy for age. There is encephalomalacia of both frontal lobes and the left temporal lobe. The midline structures are normal. Vascular: Major flow voids are preserved. Skull and upper cervical spine: Normal calvarium and skull base. Visualized upper cervical spine and soft tissues are normal. Sinuses/Orbits:No paranasal sinus fluid levels or advanced mucosal thickening. No mastoid or middle ear effusion. Normal orbits. IMPRESSION: 1. Unchanged multifocal punctate early subacute ischemia within both hemispheres. No hemorrhage or mass effect. 2. Advanced atrophy and findings of chronic ischemic microangiopathy. 3. 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 Electronically Signed   By: Ulyses Jarred M.D.   On: 06/02/2021 01:37   DG CHEST PORT 1 VIEW  Result Date: 06/01/2021 CLINICAL DATA:  Fever. EXAM: PORTABLE CHEST 1 VIEW COMPARISON:  Radiograph 05/28/2021 FINDINGS: The enteric tube is been removed in the interim. Right internal jugular catheter remains in place. Left jugular central line remains in place. Mild cardiomegaly. Streaky and bandlike opacities at the retrocardiac left lung base, increased from prior. Right lung is clear. No pulmonary edema. No pneumothorax. No pleural effusion IMPRESSION: Streaky and bandlike opacities at the left lung base, increased from prior, favor atelectasis although pneumonia could have a similar appearance in the setting of fever. Electronically Signed   By: Keith Rake M.D.   On: 06/01/2021 15:56   ECHOCARDIOGRAM COMPLETE  Result Date: 06/01/2021    ECHOCARDIOGRAM REPORT   Patient Name:   NAASON CUFFEE Date of Exam: 06/01/2021 Medical Rec #:  [id number]        Height:       70.0 in Accession #:    [id number]       Weight:       158.3 lb Date of Birth:  06-12-1946         BSA:          1.890 m Patient Age:    89 years         BP:           151/80 mmHg Patient Gender: M                HR:           102 bpm. Exam Location:  Inpatient Procedure: 2D Echo, Cardiac Doppler and Color Doppler Indications:    Stroke  History:        Patient has prior history of Echocardiogram examinations, most                 recent 05/28/2020. Risk Factors:Diabetes, Hypertension and Former                 Smoker. CKD.  Sonographer:    Clayton Lefort RDCS (AE) Referring Phys: [id number] [place]  1. TTE 05/28/20 had abnormal strain -11.6 not done on this study Clinical history suggests  severe LVH due to poorly controlled HTN and not infiltrative DCM . Left ventricular ejection fraction, by estimation, is 60 to 65%. The left ventricle has normal function. The left ventricle has no regional wall motion abnormalities. The left ventricular internal cavity size was  moderately dilated. There is severe left ventricular hypertrophy. Left ventricular diastolic parameters are consistent with Grade I diastolic dysfunction (impaired relaxation).  2. Right ventricular systolic function is normal. The right ventricular size is normal.  3. Right atrial size was mildly dilated.  4. The mitral valve is normal in structure. No evidence of mitral valve regurgitation. No evidence of mitral stenosis.  5. The aortic valve is tricuspid. Aortic valve regurgitation is not visualized. Mild to moderate aortic valve sclerosis/calcification is present, without any evidence of aortic stenosis.  6. The inferior vena cava is normal in size with greater than 50% respiratory variability, suggesting right atrial pressure of 3 mmHg. FINDINGS  Left Ventricle: TTE 05/28/20 had abnormal strain -11.6 not done on this study Clinical history suggests severe LVH due to poorly controlled HTN and not infiltrative DCM. Left ventricular ejection fraction, by estimation, is 60 to 65%. The left ventricle has normal function. The left ventricle has no regional wall motion abnormalities. The left ventricular internal cavity size was moderately dilated. There is severe left ventricular hypertrophy. Left ventricular diastolic parameters are consistent with Grade I diastolic dysfunction (impaired relaxation). Right Ventricle: The right ventricular size is normal. No increase in right ventricular wall thickness. Right ventricular systolic function is normal. Left Atrium: Left atrial size was normal in size. Right Atrium: Right atrial size was mildly dilated. Pericardium: There is no evidence of pericardial effusion. Mitral Valve: The mitral valve is normal in structure. No evidence of mitral valve regurgitation. No evidence of mitral valve stenosis. MV peak gradient, 4.9 mmHg. The mean mitral valve gradient is 2.0 mmHg. Tricuspid Valve: The tricuspid valve is normal in structure. Tricuspid valve regurgitation is trivial. No  evidence of tricuspid stenosis. Aortic Valve: The aortic valve is tricuspid. Aortic valve regurgitation is not visualized. Mild to moderate aortic valve sclerosis/calcification is present, without any evidence of aortic stenosis. Aortic valve mean gradient measures 5.0 mmHg. Aortic valve peak gradient measures 7.4 mmHg. Aortic valve area, by VTI measures 2.17 cm. Pulmonic Valve: The pulmonic valve was normal in structure. Pulmonic valve regurgitation is not visualized. No evidence of pulmonic stenosis. Aorta: The aortic root is normal in size and structure. Venous: The inferior vena cava is normal in size with greater than 50% respiratory variability, suggesting right atrial pressure of 3 mmHg. IAS/Shunts: No atrial level shunt detected by color flow Doppler.  LEFT VENTRICLE PLAX 2D LVIDd:         4.20 cm  Diastology LVIDs:         2.30 cm  LV e' medial:    7.72 cm/s LV PW:         1.70 cm  LV E/e' medial:  7.1 LV IVS:        2.20 cm  LV e' lateral:   6.74 cm/s LVOT diam:     2.00 cm  LV E/e' lateral: 8.1 LV SV:         48 LV SV Index:   26 LVOT Area:     3.14 cm  RIGHT VENTRICLE             IVC RV Basal diam:  2.70 cm     IVC diam: 1.70 cm RV S prime:  18.40 cm/s TAPSE (M-mode): 2.4 cm LEFT ATRIUM             Index       RIGHT ATRIUM           Index LA diam:        3.00 cm 1.59 cm/m  RA Area:     19.00 cm LA Vol (A2C):   46.2 ml 24.45 ml/m RA Volume:   52.20 ml  27.62 ml/m LA Vol (A4C):   34.5 ml 18.26 ml/m LA Biplane Vol: 42.4 ml 22.44 ml/m  AORTIC VALVE AV Area (Vmax):    2.19 cm AV Area (Vmean):   2.03 cm AV Area (VTI):     2.17 cm AV Vmax:           136.00 cm/s AV Vmean:          103.000 cm/s AV VTI:            0.223 m AV Peak Grad:      7.4 mmHg AV Mean Grad:      5.0 mmHg LVOT Vmax:         94.90 cm/s LVOT Vmean:        66.500 cm/s LVOT VTI:          0.154 m LVOT/AV VTI ratio: 0.69  AORTA Ao Root diam: 3.50 cm Ao Asc diam:  3.40 cm MITRAL VALVE MV Area (PHT): 3.39 cm    SHUNTS MV Area VTI:    2.76 cm    Systemic VTI:  0.15 m MV Peak grad:  4.9 mmHg    Systemic Diam: 2.00 cm MV Mean grad:  2.0 mmHg MV Vmax:       1.11 m/s MV Vmean:      69.4 cm/s MV Decel Time: 224 msec MV E velocity: 54.80 cm/s MV A velocity: 81.80 cm/s MV E/A ratio:  0.67 Jenkins Rouge MD Electronically signed by Jenkins Rouge MD Signature Date/Time: 06/01/2021/5:41:35 PM    Final                LOS: 5 days   Regla Fitzgibbon  [facility]   Pager on [email or web address]. If 7PM-7AM, please contact night-coverage at [email or web address]     06/02/2021, 11:58 AM

## 2021-06-02 NOTE — Evaluation (Addendum)
Physical Therapy Evaluation Patient Details Name: Jonathan Kane MRN: [id number] DOB: [birth year] Today's Date: 06/02/2021   History of Present Illness  75 y.o. male presenting to ED 8/11 after being found on the floor by a neighbor. CBG >600. En route to ED patient developed R gaze preference and L-sided weakness. Code stroke called. Patient developed seizure and required intubation for airway protection. SBP 229/127 in ED. Patient admitted with acute metabolic encephalopathy with hyperglycemia, hypertensive emergency and new onset seizure with status epilepticus. 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 Extubated 8/15 for HD. Overnight EEG 8/13-8/14 with new onset status epilepticus likely secondary to severe hyperglycemia. PMHx significant for ESRD on HD MWF, DMII, BPH with obstruction, Hep C, and HTN.  Clinical Impression   Pt admitted secondary to problem above with deficits below. PTA patient was living alone and independent with mobility (including driving).  Pt currently requires moderate assist of 2 for OOB with limitations due to LLE weakness and decr balance.  Anticipate patient will benefit from PT to address problems listed below.Will continue to follow acutely to maximize functional mobility independence and safety.       Follow Up Recommendations CIR    Equipment Recommendations  Rolling walker with 5" wheels    Recommendations for Other Services Rehab consult     Precautions / Restrictions Precautions Precautions: Fall Restrictions Weight Bearing Restrictions: No      Mobility  Bed Mobility Overal bed mobility: Needs Assistance Bed Mobility: Rolling;Sidelying to Sit Rolling: Min assist Sidelying to sit: HOB elevated;Mod assist       General bed mobility comments: roll to right and up from rt side; assist to move LLE over EOB and to raise torso    Transfers Overall transfer level: Needs assistance Equipment used: 2 person hand held  assist Transfers: Stand Pivot Transfers;Sit to/from Stand Sit to Stand: Mod assist;+2 physical assistance Stand pivot transfers: Mod assist;+2 physical assistance       General transfer comment: initial posterior lean in standing; able to correct with verbal and tactile cues; pivotal steps to his right min assist x 2; returned to bed for dialysis and stepping to his left +2 mod assist  Ambulation/Gait Ambulation/Gait assistance: Mod assist;+2 physical assistance Gait Distance (Feet): 2 Feet Assistive device: 2 person hand held assist Gait Pattern/deviations: Step-to pattern;Shuffle     General Gait Details: shuffling steps bed to chair and chair to bed  Stairs            Wheelchair Mobility    Modified Rankin (Stroke Patients Only) Modified Rankin (Stroke Patients Only) Pre-Morbid Rankin Score: No symptoms Modified Rankin: Moderately severe disability     Balance Overall balance assessment: Needs assistance Sitting-balance support: Single extremity supported;Feet supported Sitting balance-Leahy Scale: Poor Sitting balance - Comments: min assist with loss of balance to his left x 1; in chair again leaning to his left with pillows to support in midline Postural control: Posterior lean;Left lateral lean Standing balance support: Bilateral upper extremity supported Standing balance-Leahy Scale: Poor Standing balance comment: stood to work on achieving upright/midline; pt able to hold urinal and stand for ~90 seconds to use urinal                             Pertinent Vitals/Pain Pain Assessment: Faces Faces Pain Scale: Hurts little more Pain Location: Bilateral thighs Pain Descriptors / Indicators: Aching;Sore Pain Intervention(s): Limited activity within patient's tolerance;Monitored during session  Home Living Family/patient expects to be discharged to:: Private residence Living Arrangements: Alone Available Help at Discharge: Family;Available  PRN/intermittently Type of Home: House Home Access: Stairs to enter Entrance Stairs-Rails: None Entrance Stairs-Number of Steps: 1 Home Layout: One level Home Equipment: Walker - 2 wheels;Cane - single point;Grab bars - tub/shower;Grab bars - toilet;Shower seat - built in      Prior Function Level of Independence: Independent         Comments: Patient is a questionable historian. Reports I with ADLs/IADLs; reports driving; confirms home set-up from med chart obtained at time of last admission in 2021.     Hand Dominance   Dominant Hand: Right    Extremity/Trunk Assessment   Upper Extremity Assessment Upper Extremity Assessment: Defer to OT evaluation    Lower Extremity Assessment Lower Extremity Assessment: RLE deficits/detail;LLE deficits/detail RLE Deficits / Details: grossly 4/5 throughout LLE Deficits / Details: hip flexion 2+, knee extension 3-, ankle DF 3+ LLE Sensation: WNL LLE Coordination: WNL    Cervical / Trunk Assessment Cervical / Trunk Assessment: Normal  Communication   Communication: Other (comment) (Soft spoken)  Cognition Arousal/Alertness: Awake/alert Behavior During Therapy: Flat affect Overall Cognitive Status: Impaired/Different from baseline Area of Impairment: Orientation;Attention;Following commands;Awareness;Problem solving                 Orientation Level: Disoriented to;Situation Current Attention Level: Sustained   Following Commands: Follows one step commands with increased time   Awareness: Intellectual Problem Solving: Slow processing;Decreased initiation;Difficulty sequencing;Requires verbal cues;Requires tactile cues General Comments: Patient follows 1-step verbal commands inconsistently; requires increased time to process verbal information.      General Comments      Exercises Other Exercises Other Exercises: sit to stand x 3   Assessment/Plan    PT Assessment Patient needs continued PT services  PT Problem  List Decreased strength;Decreased activity tolerance;Decreased balance;Decreased mobility;Decreased cognition;Decreased knowledge of use of DME       PT Treatment Interventions DME instruction;Gait training;Functional mobility training;Therapeutic activities;Therapeutic exercise;Balance training;Neuromuscular re-education;Cognitive remediation;Patient/family education    PT Goals (Current goals can be found in the Care Plan section)  Acute Rehab PT Goals Patient Stated Goal: Agrees wants to gget stronger PT Goal Formulation: With patient Time For Goal Achievement: 06/16/21 Potential to Achieve Goals: Good    Frequency Min 4X/week   Barriers to discharge Decreased caregiver support lives alone    Co-evaluation               AM-PAC PT "6 Clicks" Mobility  Outcome Measure Help needed turning from your back to your side while in a flat bed without using bedrails?: A Little Help needed moving from lying on your back to sitting on the side of a flat bed without using bedrails?: Total Help needed moving to and from a bed to a chair (including a wheelchair)?: Total Help needed standing up from a chair using your arms (e.g., wheelchair or bedside chair)?: Total Help needed to walk in hospital room?: Total Help needed climbing 3-5 steps with a railing? : Total 6 Click Score: 8    End of Session Equipment Utilized During Treatment: Gait belt Activity Tolerance: Patient tolerated treatment well Patient left: in bed;with call bell/phone within reach;with nursing/sitter in room Nurse Communication: Mobility status PT Visit Diagnosis: Unsteadiness on feet (R26.81);Muscle weakness (generalized) (M62.81)    Time: 1000-1026 PT Time Calculation (min) (ACUTE ONLY): 26 min   Charges:   PT Evaluation $PT Eval Moderate Complexity: 1 Mod PT Treatments $Therapeutic Activity:  8-22 mins         Arby Barrette, PT Pager [phone]   Rexanne Mano 06/02/2021, 10:43 AM

## 2021-06-02 NOTE — Progress Notes (Signed)
Inpatient Rehabilitation Admissions Coordinator   Inpatient rehab consult received. I contacted pt's daughter by phone. Prior to admit he lived alone and was independent. His neighbor would drive him to his OP dialysis appointments. Son and daughter both work and unable to assist him. She request SNF if needed and prefer [facility]F. I will alert acute team and TOC. We will sign off at this time.  Danne Baxter, RN, MSN Rehab Admissions Coordinator [phone] 06/02/2021 3:01 PM

## 2021-06-02 NOTE — Progress Notes (Signed)
Inpatient Diabetes Program Recommendations  AACE/ADA: New Consensus Statement on Inpatient Glycemic Control (2015)  Target Ranges:  Prepandial:   less than 140 mg/dL      Peak postprandial:   less than 180 mg/dL (1-2 hours)      Critically ill patients:  140 - 180 mg/dL   Lab Results  Component Value Date   GLUCAP 199 (H) 06/02/2021   HGBA1C 11.5 (H) 06/01/2021    Review of Glycemic Control Results for Jonathan Kane, Jonathan Kane (MRN [id number]) as of 06/02/2021 11:30  Ref. Range 06/01/2021 15:02 06/01/2021 19:46 06/01/2021 23:09 06/02/2021 03:10 06/02/2021 03:14 06/02/2021 07:10 06/02/2021 09:32 06/02/2021 11:23  Glucose-Capillary Latest Ref Range: 70 - 99 mg/dL 260 (H) Novolog 8 units 305 (H) Novolog 11 units 186 (H) Novolog 3 units 66 (L) 72 192 (H) Novolog 3 units 180 (H) 199 (H)   Diabetes history: DM2 Outpatient Diabetes medications: Tresiba 25, Nov 0-7, Tradjenta 25, Glucotrol 2.5 Current orders for Inpatient glycemic control: Semglee 20 QD, 0-15 Q4H  Inpatient Diabetes Program Recommendations:   Consider: -Decrease in Novolog correction to 0-9 units -Add Novolog 2 units tid meal coverage if eats 50%  Thank you, Bethena Roys E. Nica Friske, RN, MSN, CDE  Diabetes Coordinator Inpatient Glycemic Control Team Team Pager [phone] (8am-5pm) 06/02/2021 11:32 AM

## 2021-06-02 NOTE — Plan of Care (Signed)
  Problem: Education: ?Goal: Knowledge of General Education information will improve ?Description: Including pain rating scale, medication(s)/side effects and non-pharmacologic comfort measures ?Outcome: Progressing ?  ?Problem: Health Behavior/Discharge Planning: ?Goal: Ability to manage health-related needs will improve ?Outcome: Progressing ?  ?Problem: Clinical Measurements: ?Goal: Ability to maintain clinical measurements within normal limits will improve ?Outcome: Progressing ?Goal: Will remain free from infection ?Outcome: Progressing ?Goal: Diagnostic test results will improve ?Outcome: Progressing ?Goal: Respiratory complications will improve ?Outcome: Progressing ?Goal: Cardiovascular complication will be avoided ?Outcome: Progressing ?  ?Problem: Activity: ?Goal: Risk for activity intolerance will decrease ?Outcome: Progressing ?  ?Problem: Nutrition: ?Goal: Adequate nutrition will be maintained ?Outcome: Progressing ?  ?Problem: Coping: ?Goal: Level of anxiety will decrease ?Outcome: Progressing ?  ?Problem: Elimination: ?Goal: Will not experience complications related to bowel motility ?Outcome: Progressing ?Goal: Will not experience complications related to urinary retention ?Outcome: Progressing ?  ?Problem: Pain Managment: ?Goal: General experience of comfort will improve ?Outcome: Progressing ?  ?Problem: Safety: ?Goal: Ability to remain free from injury will improve ?Outcome: Progressing ?  ?Problem: Skin Integrity: ?Goal: Risk for impaired skin integrity will decrease ?Outcome: Progressing ?  ?Problem: Safety: ?Goal: Non-violent Restraint(s) ?Outcome: Completed/Met ?  ?

## 2021-06-02 NOTE — Progress Notes (Signed)
[facility] Progress Note   Assessment/ Plan:   [facility] HD: [facility] M[facility]      400/ 1.5  70.5kg   2/2.5      Assessment/ Plan: Acute encephalopathy - w/ uncont diabetes (BS > 600), HTN emergency and new seizures.  Getting IV insulin and BP meds. Neuro consulting--> suspect hyperglycemia as cause of seizures. On Keppra.  MRI 05/30/21 with punctate areas of infarct- appear embolic vs watershed per neuro note.  Repeat MRI negative 06/01/21.     Acute embolic cerebral infarctions: 30 day monitor recommended per neuro--> now with bacteremia does he need a TEE? ESRD - on HD MWF. Had HD Sat off schedule, Now back on MWF schedule- orders written for today.  Has AVF which was last intervened upon 06/2020--> widely patent AVF, has collaterals that may be interfering with cannulation.  He has a TDC--> but given bacteremia we will try to cannulate today in effort to remove [facility] GPC bacteremia: on vancomycin/ Zosyn blood cultures positive 06/01/21 (anticipate narrowing given BC) Acute hypoxic RF- extubated DKA - sp insulin gtt, back on SQ insulin now.  HTN: on 3 BP lowering meds with soft BP--> off ACEi, on hydralazine 25 mg TID and amlodipine 5 mg daily, cardizem added back at 180 mg daily  Dispo: pending  Subjective:    Seen in room- trying to work with PT.  Blood cultures positive for GPCs in clusters.  On vanc.  L sided weakness yesterday, repeat MRI unchanged.     Objective:   BP (!) 152/85   Pulse 98   Temp 99.1 F (37.3 C) (Oral)   Resp 20   Ht '5\' 10"'$  (1.778 m)   Wt 62.1 kg   SpO2 96%   BMI 19.64 kg/m   Physical Exam: Gen: NAD, sitting in bed CVS: RRR, II/VI systolic murmur Resp: clear anteriorly Abd: thin Ext: no LE edema ACCESS: L RC AVF pulsatile, R TDC  Labs: BMET Recent Labs  Lab 05/28/21 1157 05/28/21 1206 05/28/21 1318 05/28/21 1346 05/29/21 0539 05/29/21 1326 05/29/21 1710 05/30/21 0445 05/30/21 1754 05/31/21 0416 06/01/21 0316 06/01/21 1147 06/02/21 0041   NA 129* 130*   < > 132* 139  --   --  134*  --  135 136 135 135  K 4.7 4.8   < > 4.1 3.5  --   --  3.8  --  3.8 4.3 4.4 4.5  CL 92* 96*  --   --  102  --   --  100  --  100 99 97* 98  CO2 21*  --   --   --  23  --   --  22  --  21* '24 23 22  '$ GLUCOSE 777* >700*  --   --  143*  --   --  251*  --  244* 130* 157* 157*  BUN 58* 72*  --   --  64*  --   --  43*  --  64* 28* 35* 56*  CREATININE 9.02* 9.60*  --   --  10.04*  --   --  7.47*  --  8.88* 5.47* 6.53* 7.61*  CALCIUM 8.1*  --   --   --  8.4*  --   --  7.6*  --  7.7* 8.1* 8.0* 8.3*  PHOS  --   --   --   --   --  2.8 3.2 4.4 4.2 4.3 4.5  --   --    < > =  values in this interval not displayed.   CBC Recent Labs  Lab 05/28/21 1157 05/28/21 1206 05/31/21 0416 06/01/21 0314 06/01/21 1147 06/02/21 0041  WBC 8.0   < > 7.9 12.9* 13.0* 13.3*  NEUTROABS 7.1  --  5.6  --  11.1* 10.4*  HGB 11.9*   < > 10.8* 11.3* 11.0* 11.9*  HCT 35.2*   < > 32.0* 33.7* 33.2* 34.6*  MCV 91.7   < > 91.7 92.6 93.0 92.5  PLT 146*   < > 134* 145* 150 157   < > = values in this interval not displayed.      Medications:     amLODipine  5 mg Oral Daily   aspirin  81 mg Oral Daily   atorvastatin  40 mg Oral QHS   Chlorhexidine Gluconate Cloth  6 each Topical Daily   cholecalciferol  1,000 Units Oral Daily   diltiazem  180 mg Oral Daily   feeding supplement (NEPRO CARB STEADY)  237 mL Oral BID BM   heparin injection (subcutaneous)  5,000 Units Subcutaneous Q8H   heparin sodium (porcine)       hydrALAZINE  25 mg Oral Q8H   insulin aspart  0-15 Units Subcutaneous Q4H   insulin glargine-yfgn  20 Units Subcutaneous Daily   latanoprost  1 drop Both Eyes QHS   multivitamin  1 tablet Oral QHS   sodium chloride flush  10-40 mL Intracatheter Q12H     Madelon Lips MD 06/02/2021, 12:06 PM

## 2021-06-02 NOTE — Consult Note (Signed)
[facility]    Date of Admission:  05/28/2021   Total days of antibiotics 2        Vancomycin         Zosyn               Reason for Consult: MRSA bacteremia     Referring Provider: Jennye Boroughs, MD Primary Care Provider: Cipriano Mile, NP  Assessment: Jonathan Kane is a 75 y.o. male with a past medical history of T2DM, ESRD on HD, hypertension, remote history of cocaine use disorder, BPH, alcohol use disorder hepatitis C and HSV, who admitted on 05/28/2021 for acute metabolic encephalopathy in the setting of seizure from severe hyperglycemia and hypertensive emergency. MRI with bilateral multifocal punctate ischemia due to possible embolic source vs watershed.  Noted to be tachycardic and febrile to 102.8 on 06/01/2021. Blood cultures drawn and started on vancomycin and zosyn on 06/01/2021. Blood cultures with MRSA on BCID, aerobic bottles with gram positive cocci in clusters. TTE on 06/01/2021 for stroke workup with EF of 60-65% with grade I diastolic dysfunction, mild to moderate aortic valve sclerosis/calcification. No evidence of vegetations. Repeat MRI 06/01/2021 unchanged from prior study.  Patient is ESRD on HD MWF has been using RTDC for the past several months due to issues with left AVF. Suspect line associated MRSA bacteremia. Concern for vegetations given possible embolic strokes present on MRI.   Plan: Continue on Vancomycin, stop zosyn TEE, discussed with cardiology anticipate on 06/04/2021 Recommend line holiday for RTDC  Repeat blood cultures to monitor clinical response  Active Problems:   DKA (diabetic ketoacidosis) (HCC)   Status epilepticus (HCC)   Malnutrition of moderate degree    amLODipine  5 mg Oral Daily   aspirin  81 mg Oral Daily   atorvastatin  40 mg Oral QHS   Chlorhexidine Gluconate Cloth  6 each Topical Daily   cholecalciferol  1,000 Units Oral Daily   diltiazem  180 mg Oral Daily   feeding supplement (NEPRO  CARB STEADY)  237 mL Oral BID BM   heparin injection (subcutaneous)  5,000 Units Subcutaneous Q8H   heparin sodium (porcine)       hydrALAZINE  25 mg Oral Q8H   insulin aspart  0-15 Units Subcutaneous Q4H   insulin glargine-yfgn  20 Units Subcutaneous Daily   latanoprost  1 drop Both Eyes QHS   multivitamin  1 tablet Oral QHS   sodium chloride flush  10-40 mL Intracatheter Q12H    HPI: Jonathan Kane is a 75 y.o. male with a past medical history of T2DM, ESRD on HD, hypertension, remote history of cocaine use disorder, BPH, alcohol use disorder hepatitis C and HSV, who admitted on 05/28/2021 for acute metabolic encephalopathy in the setting of severe hyperglycemia and hypertensive emergency with new onset seizures. MRI with multiple punctate bilateral  cerebral infarcts  on 05/30/2021. Intubated and admitted to the ICU. Mental status subsequently improved and was extubated on 05/31/2021.  Patient reports intermittent fevers since yesterday but state he is otherwise feeling well.  Unable to recall event leading up to admission well, but state he was in his usual state of health prior to admission. Does not an  occasional cough today and bilateral groin pain earlier today but resolved with tylenol.  Does not he has a callus between his second and third toe of his left foot. States it has been present since surgery on that toe  several years ago. Reports he shaves it every few weeks and notes some bleeding when he too deeply a few weeks ago. Denies any pain, warmth or drainage from the toe. Denies any fever, chills, dyspnea, nausea, vomiting, abdominal pain, back pain, joint pain.  Review of Systems: Review of Systems  Constitutional:  Positive for fever. Negative for chills.  HENT:  Negative for congestion and sore throat.   Eyes:  Negative for pain.  Respiratory:  Negative for shortness of breath and wheezing.   Cardiovascular:  Negative for chest pain and palpitations.  Gastrointestinal:   Negative for abdominal pain, nausea and vomiting.  Musculoskeletal:  Negative for back pain and joint pain.  Skin:  Negative for rash.  Neurological:  Positive for weakness (LLE). Negative for dizziness and headaches.   Past Medical History:  Diagnosis Date   Diabetes mellitus without complication ([place])    End stage kidney disease ([place])    Erectile dysfunction    Hypertension    Vitamin D deficiency     Social History   Tobacco Use   Smoking status: Former   Smokeless tobacco: Never  Scientific laboratory technician Use: Never used  Substance Use Topics   Alcohol use: Yes    Comment: very litte - occasional beer   Drug use: No    Family History  Problem Relation Age of Onset   Ovarian cancer Mother    Other Father        unsure of medical history   Allergies  Allergen Reactions   Dulaglutide     TRULICITY:  GI Upset intolerance    OBJECTIVE: Blood pressure (!) 149/93, pulse 99, temperature 99.1 F (37.3 C), temperature source Oral, resp. rate 17, height '5\' 10"'$  (1.778 m), weight 62.1 kg, SpO2 95 %.  Physical Exam Constitutional:      Comments: Ill appearing  HENT:     Head: Normocephalic and atraumatic.     Mouth/Throat:     Mouth: Mucous membranes are moist.     Pharynx: Oropharynx is clear.  Eyes:     Extraocular Movements: Extraocular movements intact.     Conjunctiva/sclera: Conjunctivae normal.     Pupils: Pupils are equal, round, and reactive to light.  Cardiovascular:     Rate and Rhythm: Regular rhythm. Tachycardia present.     Pulses: Normal pulses.     Heart sounds: No murmur heard.    Comments: Right TDC  Pulmonary:     Effort: Pulmonary effort is normal.     Breath sounds: Normal breath sounds. No rhonchi or rales.  Abdominal:     General: Abdomen is flat. Bowel sounds are normal. There is no distension.     Palpations: Abdomen is soft.     Tenderness: There is no abdominal tenderness.  Musculoskeletal:     Cervical back: Normal range of motion and  neck supple.     Comments: Small healing abrasion on between the left second and third toes no erythema, warmth, or drainage   Skin:    General: Skin is warm.  Neurological:     General: No focal deficit present.     Mental Status: He is alert and oriented to person, place, and time.     Sensory: No sensory deficit.     Motor: No weakness.    Lab Results Lab Results  Component Value Date   WBC 13.3 (H) 06/02/2021   HGB 11.9 (L) 06/02/2021   HCT 34.6 (L) 06/02/2021   MCV 92.5 06/02/2021  PLT 157 06/02/2021    Lab Results  Component Value Date   CREATININE 7.61 (H) 06/02/2021   BUN 56 (H) 06/02/2021   NA 135 06/02/2021   K 4.5 06/02/2021   CL 98 06/02/2021   CO2 22 06/02/2021    Lab Results  Component Value Date   ALT 63 (H) 06/02/2021   AST 74 (H) 06/02/2021   ALKPHOS 90 06/02/2021   BILITOT 0.8 06/02/2021     Microbiology: Recent Results (from the past 240 hour(s))  Resp Panel by RT-PCR (Flu A&B, Covid) Nasopharyngeal Swab     Status: None   Collection Time: 05/28/21  1:09 PM   Specimen: Nasopharyngeal Swab; Nasopharyngeal(NP) swabs in vial transport medium  Result Value Ref Range Status   SARS Coronavirus 2 by RT PCR NEGATIVE NEGATIVE Final    Comment: (NOTE) SARS-CoV-2 target nucleic acids are NOT DETECTED.  The SARS-CoV-2 RNA is generally detectable in upper respiratory specimens during the acute phase of infection. The lowest concentration of SARS-CoV-2 viral copies this assay can detect is 138 copies/mL. A negative result does not preclude SARS-Cov-2 infection and should not be used as the sole basis for treatment or other patient management decisions. A negative result may occur with  improper specimen collection/handling, submission of specimen other than nasopharyngeal swab, presence of viral mutation(s) within the areas targeted by this assay, and inadequate number of viral copies(<138 copies/mL). A negative result must be combined with clinical  observations, patient history, and epidemiological information. The expected result is Negative.  Fact Sheet for Patients:  EntrepreneurPulse.com.au  Fact Sheet for Healthcare Providers:  IncredibleEmployment.be  This test is no t yet approved or cleared by the [place] FDA and  has been authorized for detection and/or diagnosis of SARS-CoV-2 by FDA under an Emergency Use Authorization (EUA). This EUA will remain  in effect (meaning this test can be used) for the duration of the COVID-19 declaration under Section 564(b)(1) of the Act, 21 U.S.C.section 360bbb-3(b)(1), unless the authorization is terminated  or revoked sooner.       Influenza A by PCR NEGATIVE NEGATIVE Final   Influenza B by PCR NEGATIVE NEGATIVE Final    Comment: (NOTE) The Xpert Xpress SARS-CoV-2/FLU/RSV plus assay is intended as an aid in the diagnosis of influenza from Nasopharyngeal swab specimens and should not be used as a sole basis for treatment. Nasal washings and aspirates are unacceptable for Xpert Xpress SARS-CoV-2/FLU/RSV testing.  Fact Sheet for Patients: EntrepreneurPulse.com.au  Fact Sheet for Healthcare Providers: IncredibleEmployment.be  This test is not yet approved or cleared by the [place] FDA and has been authorized for detection and/or diagnosis of SARS-CoV-2 by FDA under an Emergency Use Authorization (EUA). This EUA will remain in effect (meaning this test can be used) for the duration of the COVID-19 declaration under Section 564(b)(1) of the Act, 21 U.S.C. section 360bbb-3(b)(1), unless the authorization is terminated or revoked.  Performed at [facility], [place] [address], [place], [place] [address]   MRSA Next Gen by PCR, Nasal     Status: Abnormal   Collection Time: 05/28/21  4:55 PM   Specimen: Nasal Mucosa; Nasal Swab  Result Value Ref Range Status   MRSA by PCR Next Gen DETECTED (A)  NOT DETECTED Final    Comment: RESULT CALLED TO, READ BACK BY AND VERIFIED WITH: B Tora Kindred RN 1921 05/28/21 A BROWNING (NOTE) The GeneXpert MRSA Assay (FDA approved for NASAL specimens only), is one component of a comprehensive MRSA colonization  surveillance program. It is not intended to diagnose MRSA infection nor to guide or monitor treatment for MRSA infections. Test performance is not FDA approved in patients less than 30 years old. Performed at [facility], [place] [address] [place], [place] [address]   Culture, blood (x 2)     Status: None (Preliminary result)   Collection Time: 06/01/21 11:29 AM   Specimen: BLOOD RIGHT ARM  Result Value Ref Range Status   Specimen Description BLOOD RIGHT ARM  Final   Special Requests   Final    BOTTLES DRAWN AEROBIC AND ANAEROBIC Blood Culture adequate volume   Culture  Setup Time   Final    GRAM POSITIVE COCCI IN CLUSTERS AEROBIC BOTTLE ONLY CRITICAL RESULT CALLED TO, READ BACK BY AND VERIFIED WITH: pharmd Seward Meth 06/02/2021 '@1056'$  by Blima Rich Performed at [facility], [place] [address] [place], [place] [address]    Culture GRAM POSITIVE COCCI  Final   Report Status PENDING  Incomplete  Blood Culture ID Panel (Reflexed)     Status: Abnormal   Collection Time: 06/01/21 11:29 AM  Result Value Ref Range Status   Enterococcus faecalis NOT DETECTED NOT DETECTED Final   Enterococcus Faecium NOT DETECTED NOT DETECTED Final   Listeria monocytogenes NOT DETECTED NOT DETECTED Final   Staphylococcus species DETECTED (A) NOT DETECTED Final    Comment: CRITICAL RESULT CALLED TO, READ BACK BY AND VERIFIED WITH: pharmd Seward Meth 06/02/2021 '@1056'$  by JW    Staphylococcus aureus (BCID) DETECTED (A) NOT DETECTED Final    Comment: Methicillin (oxacillin)-resistant Staphylococcus aureus (MRSA). MRSA is predictably resistant to beta-lactam antibiotics (except ceftaroline). Preferred therapy is vancomycin unless clinically contraindicated. Patient  requires contact precautions if  hospitalized. CRITICAL RESULT CALLED TO, READ BACK BY AND VERIFIED WITH: pharmd Seward Meth 06/02/2021 '@1056'$  by Blima Rich    Staphylococcus epidermidis NOT DETECTED NOT DETECTED Final   Staphylococcus lugdunensis NOT DETECTED NOT DETECTED Final   Streptococcus species NOT DETECTED NOT DETECTED Final   Streptococcus agalactiae NOT DETECTED NOT DETECTED Final   Streptococcus pneumoniae NOT DETECTED NOT DETECTED Final   Streptococcus pyogenes NOT DETECTED NOT DETECTED Final   A.calcoaceticus-baumannii NOT DETECTED NOT DETECTED Final   Bacteroides fragilis NOT DETECTED NOT DETECTED Final   Enterobacterales NOT DETECTED NOT DETECTED Final   Enterobacter cloacae complex NOT DETECTED NOT DETECTED Final   Escherichia coli NOT DETECTED NOT DETECTED Final   Klebsiella aerogenes NOT DETECTED NOT DETECTED Final   Klebsiella oxytoca NOT DETECTED NOT DETECTED Final   Klebsiella pneumoniae NOT DETECTED NOT DETECTED Final   Proteus species NOT DETECTED NOT DETECTED Final   Salmonella species NOT DETECTED NOT DETECTED Final   Serratia marcescens NOT DETECTED NOT DETECTED Final   Haemophilus influenzae NOT DETECTED NOT DETECTED Final   Neisseria meningitidis NOT DETECTED NOT DETECTED Final   Pseudomonas aeruginosa NOT DETECTED NOT DETECTED Final   Stenotrophomonas maltophilia NOT DETECTED NOT DETECTED Final   Candida albicans NOT DETECTED NOT DETECTED Final   Candida auris NOT DETECTED NOT DETECTED Final   Candida glabrata NOT DETECTED NOT DETECTED Final   Candida krusei NOT DETECTED NOT DETECTED Final   Candida parapsilosis NOT DETECTED NOT DETECTED Final   Candida tropicalis NOT DETECTED NOT DETECTED Final   Cryptococcus neoformans/gattii NOT DETECTED NOT DETECTED Final   Meth resistant mecA/C and MREJ DETECTED (A) NOT DETECTED Final    Comment: CRITICAL RESULT CALLED TO, READ BACK BY AND VERIFIED WITH: pharmd Seward Meth 06/02/2021 '@1056'$  by Blima Rich Performed at [facility], [place] [address] [place], [place] [address]   Culture, blood (x 2)     Status: None (Preliminary result)   Collection Time: 06/01/21 11:47 AM   Specimen: BLOOD  Result Value Ref Range Status   Specimen Description BLOOD BLOOD RIGHT HAND  Final   Special Requests   Final    BOTTLES DRAWN AEROBIC AND ANAEROBIC Blood Culture results may not be optimal due to an inadequate volume of blood received in culture bottles   Culture   Final    NO GROWTH < 24 HOURS Performed at [facility], [place] [address], [place],  [address]    Report Status PENDING  Incomplete   Iona Beard, MD Internal Medicine, PGY-2 Pager [phone] 06/02/2021 12:30 PM

## 2021-06-02 NOTE — Progress Notes (Signed)
Neurology Progress Note  Brief HPI: 75 y.o. male with PMHx of ESRD on hemodialysis, DM2, and HTN who was found down by neighbors with AMS and unable to speak with medic with a blood glucose that read "high" and hypertensive with a SBP in the 220's. En route, he developed a leftward lead, right gaze, rightward head turn, flaccid RUE, and generalized myoclonic twitching with intermittent left arm rising. Initially given '4mg'$  Ativan and 1,000 mg Keppra for seizure control and intubated for airway protection.   Major Interval Events:  8/15: Extubated  8/16: New left-sided weakness noted, MRI repeated  Subjective: No acute overnight events noted Patient awake, eating breakfast on assessment. States that he is feeling "much better".  Exam: Vitals:   06/02/21 0633 06/02/21 0711  BP: (!) 172/97   Pulse: (!) 102   Resp: 18   Temp:  98.9 F (37.2 C)  SpO2: 94%    Gen: Sitting up in bed, eating breakfast, in no acute distress Resp: non-labored breathing, no respiratory distress, SpO2 94% on room air Abd: soft, non-tender, non-distended  Neuro: Mental Status: Awake, alert, and oriented to self, age, month, year, place, and is able to state why he is in the hospital based on what he has been told by medical staff.  He does not have recollection of events leading to hospitalization. Speech is intact with mild dysarthria. Naming, repetition, fluency, and comprehension are intact without evidence of aphasia.  No neglect is noted.  Cranial Nerves: PERRL, EOMI without ptosis or nystagmus, visual fields are full, facial sensation to light touch is intact and symmetric, face is symmetric resting and smiling, hearing is intact to voice, shoulders shrug symmetrically, palate elevates symmetrically, phonation is normal, tongue protrudes midline.  Motor: Spontaneous and antigravity movement present throughout.  Left upper extremity is subtly weaker than the right upper extremity. LUE 4+/5, RUE 5/5 both  without vertical drift on assessment. Left lower extremity assessment limited due to reported groin pain with leg elevation. 4/5  Right lower extremity 4+/5 strength with limited elevation off of bed and without vertical drift.  Tone and bulk are normal. Sensory: Sensation to light touch is intact and symmetric in bilateral upper and lower extremities  Gait: Deferred  Pertinent Labs: CBC    Component Value Date/Time   WBC 13.3 (H) 06/02/2021 0041   RBC 3.74 (L) 06/02/2021 0041   HGB 11.9 (L) 06/02/2021 0041   HCT 34.6 (L) 06/02/2021 0041   PLT 157 06/02/2021 0041   MCV 92.5 06/02/2021 0041   MCH 31.8 06/02/2021 0041   MCHC 34.4 06/02/2021 0041   RDW 14.5 06/02/2021 0041   LYMPHSABS 0.6 (L) 06/02/2021 0041   MONOABS 1.9 (H) 06/02/2021 0041   EOSABS 0.3 06/02/2021 0041   BASOSABS 0.0 06/02/2021 0041   CMP     Component Value Date/Time   NA 135 06/02/2021 0041   K 4.5 06/02/2021 0041   CL 98 06/02/2021 0041   CO2 22 06/02/2021 0041   GLUCOSE 157 (H) 06/02/2021 0041   BUN 56 (H) 06/02/2021 0041   CREATININE 7.61 (H) 06/02/2021 0041   CALCIUM 8.3 (L) 06/02/2021 0041   PROT 6.7 06/02/2021 0041   ALBUMIN 2.6 (L) 06/02/2021 0041   AST 74 (H) 06/02/2021 0041   ALT 63 (H) 06/02/2021 0041   ALKPHOS 90 06/02/2021 0041   BILITOT 0.8 06/02/2021 0041   GFRNONAA 7 (L) 06/02/2021 0041   GFRAA 9 (L) 11/08/2019 0411   Imaging Reviewed:  MRI Brain WO contrast  06/02/2021: 1. Unchanged multifocal punctate early subacute ischemia within both hemispheres. No hemorrhage or mass effect. 2. Advanced atrophy and findings of chronic ischemic microangiopathy. 3. 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  MRI Brain WO contrast 05/30/2021: - Scattered punctate acute infarctions most consistent with embolic disease from the heart or ascending aorta. Punctate acute infarctions are visible at the right parietooccipital junction, the left temporoparietal junction,  and at both posterior frontal vertex cortical and subcortical regions. - Chronic post traumatic encephalomalacia, atrophy and gliosis affecting both anterior frontal lobes and the left temporal tip.  [place] WO contrast 05/28/2021: - Significantly motion degraded. - There is no acute intracranial hemorrhage or definite evidence of acute infarction. ASPECT score is 10.  Assessment: 75 yo M with seizure in the setting of severe hyperglycemia with improvement in blood glucose during hospitalization and without further evidence of seizure activity.    He has several areas of diffusion positivity on his MRI. These were read as acute infarcts, and some of them I do suspect are decompensated small vessel infarcts due to his dehydration in the setting of DKA. Some of the other areas I suspect may actually be postictal change. Embolic phenomena is possible, but I feel less likely. Given that he does have a mildly dilated left atrium, it may be reasonable to do a 30-day monitor looking for atrial fibrillation.    He has some persistent left sided weakness, though he does complain of pain with left lower extremity movement. Repeat MRI was obtained overnight for evaluation of patient stability with findings of unchanged areas of restricted diffusion read as multifocal punctate early subacute ischemia within both hemispheres.   Recommendations: - Discontinue Keppra as seizure was provoked and hyperglycemia has resolved without further seizure activity noted - Agree with TEE, if negative for vegetation, would favor 30 day monitor.  - Continue ASA 81 mg daily - Continue PT/OT as you are - Neurology will be available on an as needed basis for further questions or concerns  Anibal Henderson, AGACNP-BC [facility] [phone]  I have seen the patient reviewed the above note.  Repeat MRI was negative.  Agree that with MRSA bacteremia there is concern for endocarditis and agree with pursuing a TEE.  I  would continue daily baby aspirin even if this was endocarditis given his previous history.   At this time, care will primarily focus on PT/OT, if his TEE is negative then I would favor a 30-day outpatient monitor.  Neurology will be available on an as-needed basis moving forward, please call with further questions or concerns.  Roland Rack, MD [facility] [phone]  If 7pm- 7am, please page neurology on call as listed in [place].

## 2021-06-03 ENCOUNTER — Inpatient Hospital Stay (HOSPITAL_COMMUNITY): Payer: Medicare Other

## 2021-06-03 HISTORY — PX: IR REMOVAL TUN CV CATH W/O FL: IMG2289

## 2021-06-03 LAB — PROCALCITONIN: Procalcitonin: 6.13 ng/mL

## 2021-06-03 LAB — GLUCOSE, CAPILLARY
Glucose-Capillary: 80 mg/dL (ref 70–99)
Glucose-Capillary: 162 mg/dL — ABNORMAL HIGH (ref 70–99)
Glucose-Capillary: 131 mg/dL — ABNORMAL HIGH (ref 70–99)
Glucose-Capillary: 182 mg/dL — ABNORMAL HIGH (ref 70–99)
Glucose-Capillary: 297 mg/dL — ABNORMAL HIGH (ref 70–99)
Glucose-Capillary: 343 mg/dL — ABNORMAL HIGH (ref 70–99)

## 2021-06-03 LAB — BASIC METABOLIC PANEL
Anion gap: 14 (ref 5–15)
BUN: 46 mg/dL — ABNORMAL HIGH (ref 8–23)
CO2: 23 mmol/L (ref 22–32)
Calcium: 9 mg/dL (ref 8.9–10.3)
Chloride: 97 mmol/L — ABNORMAL LOW (ref 98–111)
Creatinine, Ser: 6.26 mg/dL — ABNORMAL HIGH (ref 0.61–1.24)
GFR, Estimated: 9 mL/min — ABNORMAL LOW (ref >60)
Glucose, Bld: 99 mg/dL (ref 70–99)
Potassium: 3.9 mmol/L (ref 3.5–5.1)
Sodium: 134 mmol/L — ABNORMAL LOW (ref 135–145)

## 2021-06-03 LAB — CBC WITH DIFFERENTIAL/PLATELET
Abs Immature Granulocytes: 0.15 10*3/uL — ABNORMAL HIGH (ref 0.00–0.07)
Basophils Absolute: 0 10*3/uL (ref 0.0–0.1)
Basophils Relative: 0 %
Eosinophils Absolute: 0.4 10*3/uL (ref 0.0–0.5)
Eosinophils Relative: 3 %
HCT: 31.3 % — ABNORMAL LOW (ref 39.0–52.0)
Hemoglobin: 10.6 g/dL — ABNORMAL LOW (ref 13.0–17.0)
Immature Granulocytes: 1 %
Lymphocytes Relative: 4 %
Lymphs Abs: 0.6 10*3/uL — ABNORMAL LOW (ref 0.7–4.0)
MCH: 30.8 pg (ref 26.0–34.0)
MCHC: 33.9 g/dL (ref 30.0–36.0)
MCV: 91 fL (ref 80.0–100.0)
Monocytes Absolute: 1.9 10*3/uL — ABNORMAL HIGH (ref 0.1–1.0)
Monocytes Relative: 15 %
Neutro Abs: 9.7 10*3/uL — ABNORMAL HIGH (ref 1.7–7.7)
Neutrophils Relative %: 77 %
Platelets: 177 10*3/uL (ref 150–400)
RBC: 3.44 MIL/uL — ABNORMAL LOW (ref 4.22–5.81)
RDW: 14.4 % (ref 11.5–15.5)
WBC: 12.8 10*3/uL — ABNORMAL HIGH (ref 4.0–10.5)
nRBC: 0 % (ref 0.0–0.2)

## 2021-06-03 MED ORDER — CHLORHEXIDINE GLUCONATE CLOTH 2 % EX PADS
6.0000 | MEDICATED_PAD | Freq: Every day | CUTANEOUS | Status: DC
  Administered 2021-06-05: 6 via TOPICAL

## 2021-06-03 MED ORDER — LIDOCAINE HCL 1 % IJ SOLN
INTRAMUSCULAR | Status: AC
  Filled 2021-06-03: qty 20

## 2021-06-03 MED ORDER — LIDOCAINE HCL (PF) 1 % IJ SOLN
INTRAMUSCULAR | Status: DC | PRN
  Administered 2021-06-03: 5 mL

## 2021-06-03 NOTE — Progress Notes (Signed)
Progress Note    Jonathan Kane  [id number] DOB: [birth year]  DOA: 05/28/2021 PCP: Cipriano Mile, NP      Brief Narrative:    Medical records reviewed and are as summarized below:  RADIN BUKHARI is a 75 y.o. male with medical history significant for type II DM, ESRD on hemodialysis, hypertension, substance use disorder, alcohol use disorder, hepatitis C infection, HSV infection, BPH.  He was brought to the hospital because of unresponsiveness and was found to have severe hyperglycemia and hypertensive emergency.  He developed new onset seizures with status epilepticus.  He was intubated and placed on mechanical ventilation.    Neurologist was consulted to assist with management.  He was started on Keppra.      Assessment/Plan:   Principal Problem:   MRSA bacteremia Active Problems:   DKA (diabetic ketoacidosis) ([place])   Status epilepticus ([place])   Malnutrition of moderate degree   Nutrition Problem: Moderate Malnutrition Etiology: chronic illness (ESRD on HD, DM)  Signs/Symptoms: mild fat depletion, moderate fat depletion, mild muscle depletion, moderate muscle depletion   Body mass index is 19.45 kg/m.   Acute metabolic encephalopathy, possible subacute stroke in both cerebral hemispheres: Continue aspirin and Lipitor.  Hypertension with recent hypertensive emergency: Continue antihypertensives.  2D echo showed EF estimated at 60 to 65%, severe LVH, grade 1 diastolic dysfunction  New seizures with status epilepticus: Keppra has been discontinued by the neurologist since seizure was provoked. Video EEG did not show any seizures or definitive epileptiform discharges.  There were changes suggestive of cortical dysfunction in the right hemisphere.  MRSA sepsis secondary to MRSA bacteremia: Procalcitonin was 5.92 and 6.13.  1 out of 4 culture bottles positive for MRSA.  Continue IV vancomycin.  Dialysis permacath was removed from the right upper chest on  06/03/2021. Plan for TEE.   Follow-up repeat blood cultures.  Follow-up with ID for further recommendations.    ESRD: Follow-up with nephrologist for hemodialysis.  IDDM with hyperglycemia: Hemoglobin A1c is 11.5.  Continue insulin glargine and NovoLog.  Monitor glucose levels closely.  Acute hypoxic respiratory failure: S/p extubation on 05/31/2021.  He is tolerating room air.  Diet Order             Diet regular Room service appropriate? No; Fluid consistency: Thin  Diet effective now                      Consultants: Neurologist Nephrologist Intensivist  Procedures: Video EEG    Medications:    amLODipine  5 mg Oral Daily   aspirin  81 mg Oral Daily   atorvastatin  40 mg Oral QHS   Chlorhexidine Gluconate Cloth  6 each Topical Daily   cholecalciferol  1,000 Units Oral Daily   diltiazem  180 mg Oral Daily   feeding supplement (NEPRO CARB STEADY)  237 mL Oral BID BM   heparin injection (subcutaneous)  5,000 Units Subcutaneous Q8H   hydrALAZINE  25 mg Oral Q8H   insulin aspart  0-15 Units Subcutaneous Q4H   insulin glargine-yfgn  20 Units Subcutaneous Daily   latanoprost  1 drop Both Eyes QHS   multivitamin  1 tablet Oral QHS   Continuous Infusions:  sodium chloride Stopped (05/31/21 1252)   vancomycin 750 mg (06/02/21 1303)     Anti-infectives (From admission, onward)    Start     Dose/Rate Route Frequency Ordered Stop   06/02/21 1200  vancomycin (VANCOCIN) IVPB 750 mg/150 ml  premix        750 mg 150 mL/hr over 60 Minutes Intravenous Every M-W-F (Hemodialysis) 06/01/21 1144     06/01/21 1230  piperacillin-tazobactam (ZOSYN) IVPB 2.25 g  Status:  Discontinued        2.25 g 100 mL/hr over 30 Minutes Intravenous Every 8 hours 06/01/21 1134 06/02/21 1250   06/01/21 1200  metroNIDAZOLE (FLAGYL) IVPB 500 mg  Status:  Discontinued        500 mg 100 mL/hr over 60 Minutes Intravenous Every 8 hours 06/01/21 1103 06/01/21 1125   06/01/21 1200  vancomycin  (VANCOREADY) IVPB 1500 mg/300 mL        1,500 mg 150 mL/hr over 120 Minutes Intravenous  Once 06/01/21 1103 06/01/21 1549   06/01/21 1200  piperacillin-tazobactam (ZOSYN) IVPB 2.25 g  Status:  Discontinued        2.25 g 100 mL/hr over 30 Minutes Intravenous Every 8 hours 06/01/21 1125 06/01/21 1134              Family Communication/Anticipated D/C date and plan/Code Status   DVT prophylaxis: heparin injection 5,000 Units Start: 05/28/21 2200     Code Status: Full Code  Family Communication: None Disposition Plan:    Status is: Inpatient  Remains inpatient appropriate because:IV treatments appropriate due to intensity of illness or inability to take PO and Inpatient level of care appropriate due to severity of illness  Dispo: The patient is from: Home              Anticipated d/c is to: [facility]              Patient currently is not medically stable to d/c.   Difficult to place patient No           Subjective:   Interval events noted.  He has no complaints.  No chest pain or shortness of breath.  Objective:    Vitals:   06/03/21 0654 06/03/21 0815 06/03/21 1228 06/03/21 1504  BP: (!) 148/80 (!) 155/74 (!) 164/83 (!) 146/78  Pulse: (!) 104 (!) 102 (!) 103   Resp:  18 18   Temp:  99.3 F (37.4 C) 99.6 F (37.6 C)   TempSrc:  Oral Oral   SpO2: 97% 97% 97%   Weight:      Height:       No data found.   Intake/Output Summary (Last 24 hours) at 06/03/2021 1513 Last data filed at 06/03/2021 0340 Gross per 24 hour  Intake 237 ml  Output 425 ml  Net -188 ml   Filed Weights   06/02/21 0500 06/02/21 1030 06/02/21 1353  Weight: 69.2 kg 62.1 kg 61.5 kg    Exam:  GEN: NAD SKIN: Warm and dry.  Permacath on the right upper chest has been removed. EYES: No pallor or icterus ENT: MMM CV: RRR PULM: CTA B ABD: soft, ND, NT, +BS CNS: AAO x 3, non focal EXT: No edema or tenderness         Data Reviewed:   I have personally reviewed following labs  and imaging studies:  Labs: Labs show the following:   Basic Metabolic Panel: Recent Labs  Lab 05/29/21 1326 05/29/21 1710 05/30/21 0445 05/30/21 1754 05/31/21 0416 06/01/21 0316 06/01/21 1147 06/02/21 0041 06/03/21 0502  NA  --   --  134*  --  135 136 135 135 134*  K  --   --  3.8  --  3.8 4.3 4.4 4.5 3.9  CL  --   --  100  --  100 99 97* 98 97*  CO2  --   --  22  --  21* '24 23 22 23  '$ GLUCOSE  --   --  251*  --  244* 130* 157* 157* 99  BUN  --   --  43*  --  64* 28* 35* 56* 46*  CREATININE  --   --  7.47*  --  8.88* 5.47* 6.53* 7.61* 6.26*  CALCIUM  --   --  7.6*  --  7.7* 8.1* 8.0* 8.3* 9.0  MG 2.0 2.0 2.2 2.3  --   --   --  2.2  --   PHOS 2.8 3.2 4.4 4.2 4.3 4.5  --   --   --    GFR Estimated Creatinine Clearance: 9.1 mL/min (A) (by C-G formula based on SCr of 6.26 mg/dL (H)). Liver Function Tests: Recent Labs  Lab 05/28/21 1157 05/30/21 0445 05/31/21 0416 06/01/21 0316 06/01/21 1147 06/02/21 0041  AST 36 19  --   --  88* 74*  ALT 20 15  --   --  52* 63*  ALKPHOS 69 47  --   --  70 90  BILITOT 0.8 0.5  --   --  0.9 0.8  PROT 7.4 6.0*  --   --  6.5 6.7  ALBUMIN 4.1 2.9* 2.7* 2.8* 2.7* 2.6*   No results for input(s): LIPASE, AMYLASE in the last 168 hours. No results for input(s): AMMONIA in the last 168 hours. Coagulation profile Recent Labs  Lab 05/28/21 1157 06/01/21 1147  INR 1.1 1.3*    CBC: Recent Labs  Lab 05/28/21 1157 05/28/21 1206 05/31/21 0416 06/01/21 0314 06/01/21 1147 06/02/21 0041 06/03/21 0502  WBC 8.0   < > 7.9 12.9* 13.0* 13.3* 12.8*  NEUTROABS 7.1  --  5.6  --  11.1* 10.4* 9.7*  HGB 11.9*   < > 10.8* 11.3* 11.0* 11.9* 10.6*  HCT 35.2*   < > 32.0* 33.7* 33.2* 34.6* 31.3*  MCV 91.7   < > 91.7 92.6 93.0 92.5 91.0  PLT 146*   < > 134* 145* 150 157 177   < > = values in this interval not displayed.   Cardiac Enzymes: No results for input(s): CKTOTAL, CKMB, CKMBINDEX, TROPONINI in the last 168 hours. BNP (last 3 results) No  results for input(s): PROBNP in the last 8760 hours. CBG: Recent Labs  Lab 06/02/21 1933 06/02/21 2304 06/03/21 0335 06/03/21 0829 06/03/21 1224  GLUCAP 275* 178* 80 162* 131*   D-Dimer: No results for input(s): DDIMER in the last 72 hours. Hgb A1c: Recent Labs    06/01/21 0314  HGBA1C 11.5*   Lipid Profile: Recent Labs    06/01/21 0316 06/01/21 0317  CHOL 89  --   HDL 32*  --   LDLCALC 37  --   TRIG 100 103  CHOLHDL 2.8  --    Thyroid function studies: No results for input(s): TSH, T4TOTAL, T3FREE, THYROIDAB in the last 72 hours.  Invalid input(s): FREET3 Anemia work up: No results for input(s): VITAMINB12, FOLATE, FERRITIN, TIBC, IRON, RETICCTPCT in the last 72 hours. Sepsis Labs: Recent Labs  Lab 06/01/21 0314 06/01/21 1129 06/01/21 1147 06/01/21 1402 06/02/21 0041 06/03/21 0502  PROCALCITON  --   --   --   --  5.92 6.13  WBC 12.9*  --  13.0*  --  13.3* 12.8*  LATICACIDVEN  --  1.0  --  1.0  --   --  Microbiology Recent Results (from the past 240 hour(s))  Resp Panel by RT-PCR (Flu A&B, Covid) Nasopharyngeal Swab     Status: None   Collection Time: 05/28/21  1:09 PM   Specimen: Nasopharyngeal Swab; Nasopharyngeal(NP) swabs in vial transport medium  Result Value Ref Range Status   SARS Coronavirus 2 by RT PCR NEGATIVE NEGATIVE Final    Comment: (NOTE) SARS-CoV-2 target nucleic acids are NOT DETECTED.  The SARS-CoV-2 RNA is generally detectable in upper respiratory specimens during the acute phase of infection. The lowest concentration of SARS-CoV-2 viral copies this assay can detect is 138 copies/mL. A negative result does not preclude SARS-Cov-2 infection and should not be used as the sole basis for treatment or other patient management decisions. A negative result may occur with  improper specimen collection/handling, submission of specimen other than nasopharyngeal swab, presence of viral mutation(s) within the areas targeted by this assay,  and inadequate number of viral copies(<138 copies/mL). A negative result must be combined with clinical observations, patient history, and epidemiological information. The expected result is Negative.  Fact Sheet for Patients:  EntrepreneurPulse.com.au  Fact Sheet for Healthcare Providers:  IncredibleEmployment.be  This test is no t yet approved or cleared by the [place] FDA and  has been authorized for detection and/or diagnosis of SARS-CoV-2 by FDA under an Emergency Use Authorization (EUA). This EUA will remain  in effect (meaning this test can be used) for the duration of the COVID-19 declaration under Section 564(b)(1) of the Act, 21 U.S.C.section 360bbb-3(b)(1), unless the authorization is terminated  or revoked sooner.       Influenza A by PCR NEGATIVE NEGATIVE Final   Influenza B by PCR NEGATIVE NEGATIVE Final    Comment: (NOTE) The Xpert Xpress SARS-CoV-2/FLU/RSV plus assay is intended as an aid in the diagnosis of influenza from Nasopharyngeal swab specimens and should not be used as a sole basis for treatment. Nasal washings and aspirates are unacceptable for Xpert Xpress SARS-CoV-2/FLU/RSV testing.  Fact Sheet for Patients: EntrepreneurPulse.com.au  Fact Sheet for Healthcare Providers: IncredibleEmployment.be  This test is not yet approved or cleared by the [place] FDA and has been authorized for detection and/or diagnosis of SARS-CoV-2 by FDA under an Emergency Use Authorization (EUA). This EUA will remain in effect (meaning this test can be used) for the duration of the COVID-19 declaration under Section 564(b)(1) of the Act, 21 U.S.C. section 360bbb-3(b)(1), unless the authorization is terminated or revoked.  Performed at [facility], [place] [address] [place], [place] [address]   MRSA Next Gen by PCR, Nasal     Status: Abnormal   Collection Time: 05/28/21  4:55 PM    Specimen: Nasal Mucosa; Nasal Swab  Result Value Ref Range Status   MRSA by PCR Next Gen DETECTED (A) NOT DETECTED Final    Comment: RESULT CALLED TO, READ BACK BY AND VERIFIED WITH: B Tora Kindred RN 1921 05/28/21 A BROWNING (NOTE) The GeneXpert MRSA Assay (FDA approved for NASAL specimens only), is one component of a comprehensive MRSA colonization surveillance program. It is not intended to diagnose MRSA infection nor to guide or monitor treatment for MRSA infections. Test performance is not FDA approved in patients less than 55 years old. Performed at [facility], [place] [address] [place], [place] [address]   Culture, blood (x 2)     Status: Abnormal (Preliminary result)   Collection Time: 06/01/21 11:29 AM   Specimen: BLOOD RIGHT ARM  Result Value Ref Range Status  Specimen Description BLOOD RIGHT ARM  Final   Special Requests   Final    BOTTLES DRAWN AEROBIC AND ANAEROBIC Blood Culture adequate volume   Culture  Setup Time   Final    GRAM POSITIVE COCCI IN CLUSTERS AEROBIC BOTTLE ONLY CRITICAL RESULT CALLED TO, READ BACK BY AND VERIFIED WITH: pharmd Seward Meth 06/02/2021 '@1056'$  by Blima Rich    Culture (A)  Final    STAPHYLOCOCCUS AUREUS SUSCEPTIBILITIES TO FOLLOW Performed at [facility], [place] [address], [place], [place] [address]    Report Status PENDING  Incomplete  Blood Culture ID Panel (Reflexed)     Status: Abnormal   Collection Time: 06/01/21 11:29 AM  Result Value Ref Range Status   Enterococcus faecalis NOT DETECTED NOT DETECTED Final   Enterococcus Faecium NOT DETECTED NOT DETECTED Final   Listeria monocytogenes NOT DETECTED NOT DETECTED Final   Staphylococcus species DETECTED (A) NOT DETECTED Final    Comment: CRITICAL RESULT CALLED TO, READ BACK BY AND VERIFIED WITH: pharmd Seward Meth 06/02/2021 '@1056'$  by Blima Rich    Staphylococcus aureus (BCID) DETECTED (A) NOT DETECTED Final    Comment: Methicillin (oxacillin)-resistant Staphylococcus aureus (MRSA). MRSA is  predictably resistant to beta-lactam antibiotics (except ceftaroline). Preferred therapy is vancomycin unless clinically contraindicated. Patient requires contact precautions if  hospitalized. CRITICAL RESULT CALLED TO, READ BACK BY AND VERIFIED WITH: pharmd Seward Meth 06/02/2021 '@1056'$  by Blima Rich    Staphylococcus epidermidis NOT DETECTED NOT DETECTED Final   Staphylococcus lugdunensis NOT DETECTED NOT DETECTED Final   Streptococcus species NOT DETECTED NOT DETECTED Final   Streptococcus agalactiae NOT DETECTED NOT DETECTED Final   Streptococcus pneumoniae NOT DETECTED NOT DETECTED Final   Streptococcus pyogenes NOT DETECTED NOT DETECTED Final   A.calcoaceticus-baumannii NOT DETECTED NOT DETECTED Final   Bacteroides fragilis NOT DETECTED NOT DETECTED Final   Enterobacterales NOT DETECTED NOT DETECTED Final   Enterobacter cloacae complex NOT DETECTED NOT DETECTED Final   Escherichia coli NOT DETECTED NOT DETECTED Final   Klebsiella aerogenes NOT DETECTED NOT DETECTED Final   Klebsiella oxytoca NOT DETECTED NOT DETECTED Final   Klebsiella pneumoniae NOT DETECTED NOT DETECTED Final   Proteus species NOT DETECTED NOT DETECTED Final   Salmonella species NOT DETECTED NOT DETECTED Final   Serratia marcescens NOT DETECTED NOT DETECTED Final   Haemophilus influenzae NOT DETECTED NOT DETECTED Final   Neisseria meningitidis NOT DETECTED NOT DETECTED Final   Pseudomonas aeruginosa NOT DETECTED NOT DETECTED Final   Stenotrophomonas maltophilia NOT DETECTED NOT DETECTED Final   Candida albicans NOT DETECTED NOT DETECTED Final   Candida auris NOT DETECTED NOT DETECTED Final   Candida glabrata NOT DETECTED NOT DETECTED Final   Candida krusei NOT DETECTED NOT DETECTED Final   Candida parapsilosis NOT DETECTED NOT DETECTED Final   Candida tropicalis NOT DETECTED NOT DETECTED Final   Cryptococcus neoformans/gattii NOT DETECTED NOT DETECTED Final   Meth resistant mecA/C and MREJ DETECTED (A) NOT DETECTED Final     Comment: CRITICAL RESULT CALLED TO, READ BACK BY AND VERIFIED WITH: pharmd Seward Meth 06/02/2021 '@1056'$  by Blima Rich Performed at [facility], [address], [place], [place] [address]   Culture, blood (x 2)     Status: None (Preliminary result)   Collection Time: 06/01/21 11:47 AM   Specimen: BLOOD  Result Value Ref Range Status   Specimen Description BLOOD BLOOD RIGHT HAND  Final   Special Requests   Final    BOTTLES DRAWN AEROBIC AND ANAEROBIC Blood Culture results may not be optimal  due to an inadequate volume of blood received in culture bottles   Culture   Final    NO GROWTH 2 DAYS Performed at [facility], [place] [address] [place], [place] [address]    Report Status PENDING  Incomplete  Culture, blood (Routine X 2) w Reflex to ID Panel     Status: None (Preliminary result)   Collection Time: 06/03/21  5:03 AM   Specimen: BLOOD RIGHT ARM  Result Value Ref Range Status   Specimen Description BLOOD RIGHT ARM  Final   Special Requests   Final    BOTTLES DRAWN AEROBIC AND ANAEROBIC Blood Culture adequate volume   Culture   Final    NO GROWTH < 12 HOURS Performed at [facility], [place] [address], [place], [place] [address]    Report Status PENDING  Incomplete  Culture, blood (Routine X 2) w Reflex to ID Panel     Status: None (Preliminary result)   Collection Time: 06/03/21  5:15 AM   Specimen: BLOOD RIGHT HAND  Result Value Ref Range Status   Specimen Description BLOOD RIGHT HAND  Final   Special Requests   Final    BOTTLES DRAWN AEROBIC AND ANAEROBIC Blood Culture adequate volume   Culture   Final    NO GROWTH < 12 HOURS Performed at [facility], [place] [address], [place], [place] [address]    Report Status PENDING  Incomplete    Procedures and diagnostic studies:  MR BRAIN WO CONTRAST  Result Date: 06/02/2021 CLINICAL DATA:  Acute neurologic deficit EXAM: MRI HEAD WITHOUT CONTRAST TECHNIQUE: Multiplanar, multiecho pulse sequences of the brain and  surrounding structures were obtained without intravenous contrast. COMPARISON:  05/30/2021 FINDINGS: Brain: Unchanged multifocal punctate early subacute ischemia within both hemispheres. No acute or chronic hemorrhage. Hyperintense T2-weighted signal is moderately widespread throughout the white matter. Advanced atrophy for age. There is encephalomalacia of both frontal lobes and the left temporal lobe. The midline structures are normal. Vascular: Major flow voids are preserved. Skull and upper cervical spine: Normal calvarium and skull base. Visualized upper cervical spine and soft tissues are normal. Sinuses/Orbits:No paranasal sinus fluid levels or advanced mucosal thickening. No mastoid or middle ear effusion. Normal orbits. IMPRESSION: 1. Unchanged multifocal punctate early subacute ischemia within both hemispheres. No hemorrhage or mass effect. 2. Advanced atrophy and findings of chronic ischemic microangiopathy. 3. 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 Electronically Signed   By: Ulyses Jarred M.D.   On: 06/02/2021 01:37   IR Removal Tun Cv Cath W/O FL  Result Date: 06/03/2021 INDICATION: Patient admitted to the hospital with MRSA bacteremia in the setting of long-term tunneled HD line. Interventional radiology asked to remove tunneled HD line for catheter holiday. EXAM: REMOVAL TUNNELED CENTRAL VENOUS CATHETER MEDICATIONS: 1% lidocaine 3 mL ANESTHESIA/SEDATION: None FLUOROSCOPY TIME:  None COMPLICATIONS: None immediate. PROCEDURE: Informed written consent was obtained from the patient after a thorough discussion of the procedural risks, benefits and alternatives. All questions were addressed. Maximal Sterile Barrier Technique was utilized including caps, mask, sterile gowns, sterile gloves, sterile drape, hand hygiene and skin antiseptic. A timeout was performed prior to the initiation of the procedure. The patient's right chest and catheter was prepped and draped in  a normal sterile fashion. Heparin was removed from both ports of catheter. 1% lidocaine was used for local anesthesia. Using gentle blunt dissection the cuff of the catheter was exposed and the catheter was removed in it's  entirety. Pressure was held till hemostasis was obtained. A sterile dressing was applied. The patient tolerated the procedure well with no immediate complications. IMPRESSION: Successful catheter removal as described above. Read by: Soyla Dryer, NP Electronically Signed   By: Corrie Mckusick D.O.   On: 06/03/2021 10:44   ECHOCARDIOGRAM COMPLETE  Result Date: 06/01/2021    ECHOCARDIOGRAM REPORT   Patient Name:   MIGUEL LIANG Date of Exam: 06/01/2021 Medical Rec #:  [id number]        Height:       70.0 in Accession #:    [id number]       Weight:       158.3 lb Date of Birth:  06/29/1946         BSA:          1.890 m Patient Age:    47 years         BP:           151/80 mmHg Patient Gender: M                HR:           102 bpm. Exam Location:  Inpatient Procedure: 2D Echo, Cardiac Doppler and Color Doppler Indications:    Stroke  History:        Patient has prior history of Echocardiogram examinations, most                 recent 05/28/2020. Risk Factors:Diabetes, Hypertension and Former                 Smoker. CKD.  Sonographer:    Clayton Lefort RDCS (AE) Referring Phys: [id number] [place]  1. TTE 05/28/20 had abnormal strain -11.6 not done on this study Clinical history suggests severe LVH due to poorly controlled HTN and not infiltrative DCM . Left ventricular ejection fraction, by estimation, is 60 to 65%. The left ventricle has normal function. The left ventricle has no regional wall motion abnormalities. The left ventricular internal cavity size was moderately dilated. There is severe left ventricular hypertrophy. Left ventricular diastolic parameters are consistent with Grade I diastolic dysfunction (impaired relaxation).  2. Right ventricular systolic function is normal.  The right ventricular size is normal.  3. Right atrial size was mildly dilated.  4. The mitral valve is normal in structure. No evidence of mitral valve regurgitation. No evidence of mitral stenosis.  5. The aortic valve is tricuspid. Aortic valve regurgitation is not visualized. Mild to moderate aortic valve sclerosis/calcification is present, without any evidence of aortic stenosis.  6. The inferior vena cava is normal in size with greater than 50% respiratory variability, suggesting right atrial pressure of 3 mmHg. FINDINGS  Left Ventricle: TTE 05/28/20 had abnormal strain -11.6 not done on this study Clinical history suggests severe LVH due to poorly controlled HTN and not infiltrative DCM. Left ventricular ejection fraction, by estimation, is 60 to 65%. The left ventricle has normal function. The left ventricle has no regional wall motion abnormalities. The left ventricular internal cavity size was moderately dilated. There is severe left ventricular hypertrophy. Left ventricular diastolic parameters are consistent with Grade I diastolic dysfunction (impaired relaxation). Right Ventricle: The right ventricular size is normal. No increase in right ventricular wall thickness. Right ventricular systolic function is normal. Left Atrium: Left atrial size was normal in size. Right Atrium: Right atrial size was mildly dilated. Pericardium: There is no evidence of pericardial effusion. Mitral Valve: The mitral valve is  normal in structure. No evidence of mitral valve regurgitation. No evidence of mitral valve stenosis. MV peak gradient, 4.9 mmHg. The mean mitral valve gradient is 2.0 mmHg. Tricuspid Valve: The tricuspid valve is normal in structure. Tricuspid valve regurgitation is trivial. No evidence of tricuspid stenosis. Aortic Valve: The aortic valve is tricuspid. Aortic valve regurgitation is not visualized. Mild to moderate aortic valve sclerosis/calcification is present, without any evidence of aortic stenosis.  Aortic valve mean gradient measures 5.0 mmHg. Aortic valve peak gradient measures 7.4 mmHg. Aortic valve area, by VTI measures 2.17 cm. Pulmonic Valve: The pulmonic valve was normal in structure. Pulmonic valve regurgitation is not visualized. No evidence of pulmonic stenosis. Aorta: The aortic root is normal in size and structure. Venous: The inferior vena cava is normal in size with greater than 50% respiratory variability, suggesting right atrial pressure of 3 mmHg. IAS/Shunts: No atrial level shunt detected by color flow Doppler.  LEFT VENTRICLE PLAX 2D LVIDd:         4.20 cm  Diastology LVIDs:         2.30 cm  LV e' medial:    7.72 cm/s LV PW:         1.70 cm  LV E/e' medial:  7.1 LV IVS:        2.20 cm  LV e' lateral:   6.74 cm/s LVOT diam:     2.00 cm  LV E/e' lateral: 8.1 LV SV:         48 LV SV Index:   26 LVOT Area:     3.14 cm  RIGHT VENTRICLE             IVC RV Basal diam:  2.70 cm     IVC diam: 1.70 cm RV S prime:     18.40 cm/s TAPSE (M-mode): 2.4 cm LEFT ATRIUM             Index       RIGHT ATRIUM           Index LA diam:        3.00 cm 1.59 cm/m  RA Area:     19.00 cm LA Vol (A2C):   46.2 ml 24.45 ml/m RA Volume:   52.20 ml  27.62 ml/m LA Vol (A4C):   34.5 ml 18.26 ml/m LA Biplane Vol: 42.4 ml 22.44 ml/m  AORTIC VALVE AV Area (Vmax):    2.19 cm AV Area (Vmean):   2.03 cm AV Area (VTI):     2.17 cm AV Vmax:           136.00 cm/s AV Vmean:          103.000 cm/s AV VTI:            0.223 m AV Peak Grad:      7.4 mmHg AV Mean Grad:      5.0 mmHg LVOT Vmax:         94.90 cm/s LVOT Vmean:        66.500 cm/s LVOT VTI:          0.154 m LVOT/AV VTI ratio: 0.69  AORTA Ao Root diam: 3.50 cm Ao Asc diam:  3.40 cm MITRAL VALVE MV Area (PHT): 3.39 cm    SHUNTS MV Area VTI:   2.76 cm    Systemic VTI:  0.15 m MV Peak grad:  4.9 mmHg    Systemic Diam: 2.00 cm MV Mean grad:  2.0 mmHg MV Vmax:  1.11 m/s MV Vmean:      69.4 cm/s MV Decel Time: 224 msec MV E velocity: 54.80 cm/s MV A velocity: 81.80 cm/s  MV E/A ratio:  0.67 Jenkins Rouge MD Electronically signed by Jenkins Rouge MD Signature Date/Time: 06/01/2021/5:41:35 PM    Final                LOS: 6 days   Jennye Boroughs  [facility]   Pager on [email or web address]. If 7PM-7AM, please contact night-coverage at [email or web address]     06/03/2021, 3:13 PM

## 2021-06-03 NOTE — H&P (View-Only) (Signed)
    [facility] has been requested to perform a transesophageal echocardiogram on 06/04/2021 for bacteremia.  After careful review of history and examination, the risks and benefits of transesophageal echocardiogram have been explained including risks of esophageal damage, perforation (1:10,000 risk), bleeding, pharyngeal hematoma as well as other potential complications associated with conscious sedation including aspiration, arrhythmia, respiratory failure and death. Alternatives to treatment were discussed, questions were answered. Patient is willing to proceed.   Jonathan Kane is a 75 year old male with possible catheter related MRSA bacteremia.  Hemoglobin 10.6.  Platelet was 77.  Almyra Deforest, PA-C 06/03/2021 4:11 PM

## 2021-06-03 NOTE — Progress Notes (Signed)
    [facility] Infectious [facility]   Date of Admission:  05/28/2021     Reason for visit: Follow up on MRSA bacteremia  Interval History:  Patient at IR today for tunneled HD line removal and not seen as a result.    Assessment:  1.  MRSA bacteremia: Suspected secondary to catheter related bloodstream infection.  Difficult to determine which of his indwelling catheters was the potential source, however, suspect that it may have been his nontunneled line placed at admission as he did not develop fevers or leukocytosis until hospital day #4.  However, in the setting of MRSA bacteremia his tunneled HD line was indicated to be removed which was done today.  TEE planned for tomorrow to further evaluate for endocarditis in the setting of cerebral infarcts seen on MRI.  Discussed with neurology regarding the MRI findings which may be more decompensated small vessel infarcts due to dehydration in the setting of DKA as well as post ictal changes.  Certainly, however, embolic phenomenon remains a possibility.  Possibly septic emboli in the setting of MRSA bacteremia but patient also high risk for atrial fibrillation. 2.  Cerebral infarcts seen on MRI: As noted above, further evaluation with TEE pending for tomorrow to rule out endocarditis. 3.  ESRD on HD: Going for tunneled line removal today. 4.  Diabetes: Presented with DKA and hemoglobin A1c is 11.5.  Recommendations: -Continue vancomycin dosed per pharmacy -Appreciate IR seeing patient for line removal -Follow-up repeat blood cultures which are currently no growth -TEE pending for tomorrow -Will follow   Raynelle Highland for Infectious [place] [phone] pager 06/03/2021, 10:46 AM

## 2021-06-03 NOTE — Progress Notes (Signed)
[facility] Progress Note   Assessment/ Plan:   [facility] HD: [facility] M[facility]      400/ 1.5  70.5kg   2/2.5      Assessment/ Plan: Acute encephalopathy - w/ uncont diabetes (BS > 600), HTN emergency and new seizures.  Getting IV insulin and BP meds. Neuro consulting--> suspect hyperglycemia as cause of seizures. On Keppra.  MRI 05/30/21 with punctate areas of infarct- appear embolic vs watershed per neuro note.  Repeat MRI negative 06/01/21.     Acute embolic cerebral infarctions: 30 day monitor recommended per neuro MRSA bacteremia: on vanc, s/p RIJ TDC removal 8/18 with IR for line holiday. ID on board, TEE planned for tomorrow ESRD - on HD MWF. Had HD Sat off schedule, Now back on MWF schedule-HD tomorrow.  Has AVF which was last intervened upon 06/2020--> widely patent AVF, has collaterals that may be interfering with cannulation. Will attempt to cannulate his AVF moving forward Acute hypoxic RF- extubated, on RA DKA - sp insulin gtt, back on SQ insulin now.  HTN: on 3 BP lowering meds with soft BP--> off ACEi, on hydralazine 25 mg TID and amlodipine 5 mg daily, cardizem added back at 180 mg daily  Dispo: pending  Subjective:    S/p tdc removal today-tolerated well, tee tomorrow. No complaints currently. He is not eager about using his AVF   Objective:   BP (!) 155/74 (BP Location: Right Arm)   Pulse (!) 102   Temp 99.3 F (37.4 C) (Oral)   Resp 18   Ht '5\' 10"'$  (1.778 m)   Wt 61.5 kg   SpO2 97%   BMI 19.45 kg/m   Physical Exam: Gen: NAD, laying flat in bed CVS: RRR, II/VI systolic murmur Resp: clear anteriorly Abd: thin Ext: no LE edema ACCESS: L RC AVF pulsatile  Labs: BMET Recent Labs  Lab 05/29/21 0539 05/29/21 1326 05/29/21 1710 05/30/21 0445 05/30/21 1754 05/31/21 0416 06/01/21 0316 06/01/21 1147 06/02/21 0041 06/03/21 0502  NA 139  --   --  134*  --  135 136 135 135 134*  K 3.5  --   --  3.8  --  3.8 4.3 4.4 4.5 3.9  CL 102  --   --  100  --  100 99  97* 98 97*  CO2 23  --   --  22  --  21* '24 23 22 23  '$ GLUCOSE 143*  --   --  251*  --  244* 130* 157* 157* 99  BUN 64*  --   --  43*  --  64* 28* 35* 56* 46*  CREATININE 10.04*  --   --  7.47*  --  8.88* 5.47* 6.53* 7.61* 6.26*  CALCIUM 8.4*  --   --  7.6*  --  7.7* 8.1* 8.0* 8.3* 9.0  PHOS  --  2.8 3.2 4.4 4.2 4.3 4.5  --   --   --    CBC Recent Labs  Lab 05/31/21 0416 06/01/21 0314 06/01/21 1147 06/02/21 0041 06/03/21 0502  WBC 7.9 12.9* 13.0* 13.3* 12.8*  NEUTROABS 5.6  --  11.1* 10.4* 9.7*  HGB 10.8* 11.3* 11.0* 11.9* 10.6*  HCT 32.0* 33.7* 33.2* 34.6* 31.3*  MCV 91.7 92.6 93.0 92.5 91.0  PLT 134* 145* 150 157 177      Medications:     amLODipine  5 mg Oral Daily   aspirin  81 mg Oral Daily   atorvastatin  40 mg Oral QHS  Chlorhexidine Gluconate Cloth  6 each Topical Daily   cholecalciferol  1,000 Units Oral Daily   diltiazem  180 mg Oral Daily   feeding supplement (NEPRO CARB STEADY)  237 mL Oral BID BM   heparin injection (subcutaneous)  5,000 Units Subcutaneous Q8H   hydrALAZINE  25 mg Oral Q8H   insulin aspart  0-15 Units Subcutaneous Q4H   insulin glargine-yfgn  20 Units Subcutaneous Daily   latanoprost  1 drop Both Eyes QHS   lidocaine       multivitamin  1 tablet Oral QHS     Gean Quint, MD [facility] 06/03/2021, 12:24 PM

## 2021-06-03 NOTE — NC FL2 (Addendum)
[place] LEVEL OF CARE SCREENING TOOL     IDENTIFICATION  Patient Name: Jonathan Kane Birthdate: 04-02-1946 Sex: male Admission Date (Current Location): 05/28/2021  [facility] and [place] Number:  Herbalist and Address:  [facility], [place] [address], [place], [place] [address]      Provider Number: [id number]  Attending Physician Name and Address:  Jennye Boroughs, MD  Relative Name and Phone Number:       Current Level of Care: Hospital Recommended Level of Care: [place] Prior Approval Number:    Date Approved/Denied:   PASRR Number: [id number]  Discharge Plan: SNF    Current Diagnoses: Patient Active Problem List   Diagnosis Date Noted   MRSA bacteremia 06/02/2021   Malnutrition of moderate degree 06/01/2021   DKA (diabetic ketoacidosis) ([place]) 05/28/2021   Status epilepticus ([place])    Memory loss 04/13/2021   Cerebrovascular accident (CVA) ([place]) 01/12/2021   DM type 2 with diabetic peripheral neuropathy ([place]) 01/12/2021   Normal anion gap metabolic acidosis [id number]   Secondary hyperparathyroidism of renal origin ([place]) [id number]   Acute metabolic encephalopathy [id number]   Acute on chronic renal failure ([place]) 11/07/2019   CKD (chronic kidney disease) stage 5, GFR less than 15 ml/min ([place]) 09/02/2019   CKD (chronic kidney disease) stage 4, GFR 15-29 ml/min ([place]) 08/01/2019   Paronychia of fifth toe of left foot 05/29/2019   Microalbuminuria due to type 2 diabetes mellitus ([place]) 02/14/2019   Posterior vitreous detachment of both eyes 01/17/2019   Presbyopia of both eyes 01/17/2019   Pseudophakia of both eyes 01/17/2019   Abnormal finding on diagnostic imaging of right testicle 09/14/2018   Epididymitis, left 09/14/2018   Swelling of left testicle 08/23/2018   Primary open angle glaucoma (POAG) of both eyes, mild stage 01/02/2018   Benign hypertension with chronic kidney disease, stage V ([place])  11/30/2017   Proteinuria 11/30/2017   Mouth burn, initial encounter 11/10/2017   Vitamin D deficiency, unspecified 11/10/2017   Erectile dysfunction associated with type 2 diabetes mellitus ([place]) 08/29/2017   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 ([place]) 08/16/2017   Hypoglycemia 08/16/2017   PCO (posterior capsular opacification), left 03/03/2017   Hypermetropia of both eyes 03/03/2017   Cocaine dependence in remission ([place]) 07/26/2016   Herpes simplex infection 07/26/2016   CKD stage 4 due to type 2 diabetes mellitus ([place]) 07/07/2016   Noncompliance with diabetes treatment 07/07/2016   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 03/21/2016   Combined arterial insufficiency and corporo-venous occlusive erectile dysfunction 03/04/2016   Allergic rhinitis with postnasal drip 02/24/2016   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   Type 2 diabetes mellitus ([place]) 02/23/2016   Essential hypertension 02/23/2016   Musculoskeletal chest pain 02/23/2016   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[place]) 12/09/2015   Polyneuropathy, unspecified 11/12/2013   Constipation 03/23/2013   Hallucinations 03/23/2013   Polyneuropathy in diabetes ([place]) 03/23/2013   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[place]) 08/30/2011   Diabetes mellitus type II, controlled ([place]) 08/30/2011   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    Orientation RESPIRATION BLADDER Height & Weight     Self, Time, Situation, Place  Normal Continent Weight: 135 lb 9.3 oz (61.5 kg) Height:  '5\' 10"'$  (177.8 cm)  BEHAVIORAL SYMPTOMS/MOOD NEUROLOGICAL BOWEL NUTRITION STATUS      Incontinent Diet (see DC summary)  AMBULATORY STATUS COMMUNICATION OF NEEDS Skin   Extensive Assist Verbally Normal  Personal Care Assistance Level of Assistance  Bathing, Feeding, Dressing Bathing Assistance: Limited assistance Feeding assistance: Limited  assistance Dressing Assistance: Limited assistance     Functional Limitations Info  Hearing   Hearing Info: Impaired      SPECIAL CARE FACTORS FREQUENCY  PT (By licensed PT), OT (By licensed OT), Speech therapy     PT Frequency: 5x/wk OT Frequency: 5x/wk     Speech Therapy Frequency: 5x/wk      Contractures Contractures Info: Not present    Additional Factors Info  Code Status, Allergies, Insulin Sliding Scale, Isolation Precautions Code Status Info: Full Allergies Info: Dulaglutide   Insulin Sliding Scale Info: see DC summary Isolation Precautions Info: Contact precautions: MRSA Bacteremia     Current Medications (06/03/2021):  This is the current hospital active medication list Current Facility-Administered Medications  Medication Dose Route Frequency Provider Last Rate Last Admin   0.9 %  sodium chloride infusion   Intravenous Continuous Chesley Mires, MD   Stopped at 05/31/21 1252   acetaminophen (TYLENOL) tablet 650 mg  650 mg Oral Q6H PRN Chesley Mires, MD   650 mg at 06/02/21 1530   amLODipine (NORVASC) tablet 5 mg  5 mg Oral Daily Maudie Mercury, MD   5 mg at 06/02/21 [id number]   aspirin chewable tablet 81 mg  81 mg Oral Daily Lavina Hamman, MD   81 mg at 06/02/21 [id number]   atorvastatin (LIPITOR) tablet 40 mg  40 mg Oral QHS Lavina Hamman, MD   40 mg at 06/02/21 2208   Chlorhexidine Gluconate Cloth 2 % PADS 6 each  6 each Topical Daily Jennye Boroughs, MD       cholecalciferol (VITAMIN D3) tablet 1,000 Units  1,000 Units Oral Daily Lavina Hamman, MD   1,000 Units at 06/02/21 [id number]   diltiazem (CARDIZEM CD) 24 hr capsule 180 mg  180 mg Oral Daily Jennye Boroughs, MD   180 mg at 06/02/21 1022   docusate sodium (COLACE) capsule 100 mg  100 mg Oral BID PRN Lavina Hamman, MD       feeding supplement (NEPRO CARB STEADY) liquid 237 mL  237 mL Oral BID BM Lavina Hamman, MD   237 mL at 06/02/21 1445   heparin injection 5,000 Units  5,000 Units Subcutaneous Q8H Chesley Mires,  MD   5,000 Units at 06/03/21 0654   heparin sodium (porcine) injection 1,000 Units  1,000 Units Intracatheter Q dialysis Madelon Lips, MD   3,800 Units at 06/02/21 1349   hydrALAZINE (APRESOLINE) tablet 25 mg  25 mg Oral Q8H Lavina Hamman, MD   25 mg at 06/03/21 0655   insulin aspart (novoLOG) injection 0-15 Units  0-15 Units Subcutaneous Q4H Spero Geralds, MD   3 Units at 06/03/21 0832   insulin glargine-yfgn (SEMGLEE) injection 20 Units  20 Units Subcutaneous Daily Lavina Hamman, MD   20 Units at 06/02/21 0933   labetalol (NORMODYNE) injection 10 mg  10 mg Intravenous Q2H PRN Chesley Mires, MD   10 mg at 06/03/21 0505   latanoprost (XALATAN) 0.005 % ophthalmic solution 1 drop  1 drop Both Eyes QHS Chesley Mires, MD   1 drop at 06/02/21 2208   lidocaine (LMX) 4 % cream   Topical BID PRN Maudie Mercury, MD       lidocaine (XYLOCAINE) 1 % (with pres) injection            multivitamin (RENA-VIT) tablet 1 tablet  1 tablet Oral QHS Posey Pronto,  Josetta Huddle, MD   1 tablet at 06/02/21 2208   oxyCODONE (Oxy IR/ROXICODONE) immediate release tablet 5 mg  5 mg Oral Q8H PRN Jennye Boroughs, MD   5 mg at 06/02/21 2208   vancomycin (VANCOCIN) IVPB 750 mg/150 ml premix  750 mg Intravenous Q M,W,F-HD Paytes, Austin A, RPH 150 mL/hr at 06/02/21 1303 750 mg at 06/02/21 1303     Discharge Medications: Please see discharge summary for a list of discharge medications.  Relevant Imaging Results:  Relevant Lab Results:   Additional Information SS#: [id number]; HD MWF at [facility], [place]

## 2021-06-03 NOTE — Procedures (Signed)
Successful removal of right IJ tunneled HD catheter.   After obtaining consent and performing a time-out, the right upper chest was prepped and draped in the normal sterile fashion. The heparin was removed from both ports. 1% lidocaine was used for local anesthesia. Using gentle blunt dissection the cuff of the catheter was exposed and the catheter was removed in its entirety. Pressure was held until hemostasis was obtained. A sterile dressing was applied. The patient tolerated the procedure well with no immediate complications.    Jonathan Kane, [place] [phone] 06/03/2021, 10:35 AM

## 2021-06-03 NOTE — Progress Notes (Signed)
  Speech Language Pathology Treatment: Dysphagia;Cognitive-Linquistic  Patient Details Name: Jonathan Kane MRN: [id number] DOB: [birth year] Today's Date: 06/03/2021 Time: 1300-1310 SLP Time Calculation (min) (ACUTE ONLY): 10 min  Assessment / Plan / Recommendation Clinical Impression  Pt now alert and appropriate, no signs of aphasia. Able to self feed regular textures with dentures in place. Upgraded diet and sign off.   HPI HPI: 75 year old male with a diabetes and hypertension who presented with hypertensive emergency, hyperglycemic nonketotic coma and new onset seizure in the setting of hypoglycemia. Intubated 8/12-8/15. MRI shows small strokes, but neurologist questions if some of these may be postictal changes. Report reads "Scattered punctate acute infarctions most consistent with embolic  disease from the heart or ascending aorta. Punctate acute  infarctions are visible at the right parietooccipital junction, the  left temporoparietal junction, and at both posterior frontal vertex  cortical and subcortical regions."      SLP Plan          Recommendations  Diet recommendations: Regular;Thin liquid                Follow up Recommendations: None       GO                Jonathan Kane, Katherene Ponto 06/03/2021, 2:18 PM

## 2021-06-03 NOTE — Progress Notes (Signed)
Physical Therapy Treatment Patient Details Name: Jonathan Kane MRN: [id number] DOB: [birth year] Today's Date: 06/03/2021    History of Present Illness 75 y.o. male presenting to ED 8/11 after being found on the floor by a neighbor. CBG >600. En route to ED patient developed R gaze preference and L-sided weakness. Code stroke called. Patient developed seizure and required intubation for airway protection. SBP 229/127 in ED. Patient admitted with acute metabolic encephalopathy with hyperglycemia, hypertensive emergency and new onset seizure with status epilepticus. 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 Extubated 8/15 for HD. Overnight EEG 8/13-8/14 with new onset status epilepticus likely secondary to severe hyperglycemia. PMHx significant for ESRD on HD MWF, DMII, BPH with obstruction, Hep C, and HTN.    PT Comments    Patient limited by fatigue this session from participation with OT prior to session. Patient with shuffling steps during forward gait progression requiring modA to maintain balance due to posterior lean. Worked on pre gait activities with forward stepping towards object to work on improving step length. Continue to recommend comprehensive inpatient rehab (CIR) for post-acute therapy needs.     Follow Up Recommendations  CIR     Equipment Recommendations  Rolling Juleen Sorrels with 5" wheels    Recommendations for Other Services       Precautions / Restrictions Precautions Precautions: Fall Precaution Comments: L inattention Restrictions Weight Bearing Restrictions: No    Mobility  Bed Mobility Overal bed mobility: Needs Assistance Bed Mobility: Supine to Sit   Sidelying to sit: HOB elevated;Min assist       General bed mobility comments: in recliner on arrival    Transfers Overall transfer level: Needs assistance Equipment used: 1 person hand held assist Transfers: Sit to/from Stand Sit to Stand: Min assist Stand pivot transfers: Min  assist       General transfer comment: minA to rise and steady. Cues for hand placement. Repeated sit to stand x 5.  Ambulation/Gait Ambulation/Gait assistance: Mod assist Gait Distance (Feet): 3 Feet Assistive device: 1 person hand held assist Gait Pattern/deviations: Step-to pattern;Shuffle Gait velocity: decreased   General Gait Details: shuffling steps foward with modA for balance. Tends to lean posteriorly. Worked on pre gait activities in front of recliner with stepping forward towards target bilaterally.   Stairs             Wheelchair Mobility    Modified Rankin (Stroke Patients Only) Modified Rankin (Stroke Patients Only) Pre-Morbid Rankin Score: No symptoms Modified Rankin: Moderately severe disability     Balance Overall balance assessment: Needs assistance Sitting-balance support: Feet supported Sitting balance-Leahy Scale: Fair Sitting balance - Comments: no LOB noted this date and no pushing.  Able to achieve and maintain midline sitting without assist   Standing balance support: Single extremity supported Standing balance-Leahy Scale: Poor Standing balance comment: reliant on UE support and external assist. Posterior lean with mobility                            Cognition Arousal/Alertness: Awake/alert Behavior During Therapy: WFL for tasks assessed/performed Overall Cognitive Status: Impaired/Different from baseline Area of Impairment: Attention;Following commands;Awareness;Problem solving                   Current Attention Level: Sustained   Following Commands: Follows one step commands with increased time   Awareness: Emergent Problem Solving: Requires verbal cues;Slow processing        Exercises  General Comments        Pertinent Vitals/Pain Pain Assessment: Faces Faces Pain Scale: Hurts whole lot Pain Location: headache to R temporal area Pain Descriptors / Indicators: Stabbing;Sharp Pain  Intervention(s): Monitored during session;Repositioned;Limited activity within patient's tolerance    Home Living                      Prior Function            PT Goals (current goals can now be found in the care plan section) Acute Rehab PT Goals Patient Stated Goal: Move better and drive PT Goal Formulation: With patient Time For Goal Achievement: 06/16/21 Potential to Achieve Goals: Good Progress towards PT goals: Progressing toward goals    Frequency    Min 4X/week      PT Plan Current plan remains appropriate    Co-evaluation              AM-PAC PT "6 Clicks" Mobility   Outcome Measure  Help needed turning from your back to your side while in a flat bed without using bedrails?: A Little Help needed moving from lying on your back to sitting on the side of a flat bed without using bedrails?: A Little Help needed moving to and from a bed to a chair (including a wheelchair)?: A Little Help needed standing up from a chair using your arms (e.g., wheelchair or bedside chair)?: A Little Help needed to walk in hospital room?: A Lot Help needed climbing 3-5 steps with a railing? : Total 6 Click Score: 15    End of Session Equipment Utilized During Treatment: Gait belt Activity Tolerance: Patient tolerated treatment well Patient left: in chair;with call bell/phone within reach;with chair alarm set Nurse Communication: Mobility status PT Visit Diagnosis: Unsteadiness on feet (R26.81);Muscle weakness (generalized) (M62.81)     Time: [id number] PT Time Calculation (min) (ACUTE ONLY): 15 min  Charges:  $Gait Training: 8-22 mins                     Shanina Kepple A. Gilford Rile PT, DPT Acute Rehabilitation Services Pager [phone] Office [phone]    Linna Hoff 06/03/2021, 5:32 PM

## 2021-06-03 NOTE — Progress Notes (Signed)
    [facility] has been requested to perform a transesophageal echocardiogram on 06/04/2021 for bacteremia.  After careful review of history and examination, the risks and benefits of transesophageal echocardiogram have been explained including risks of esophageal damage, perforation (1:10,000 risk), bleeding, pharyngeal hematoma as well as other potential complications associated with conscious sedation including aspiration, arrhythmia, respiratory failure and death. Alternatives to treatment were discussed, questions were answered. Patient is willing to proceed.   Jonathan Kane is a 75 year old male with possible catheter related MRSA bacteremia.  Hemoglobin 10.6.  Platelet was 77.  Almyra Deforest, PA-C 06/03/2021 4:11 PM

## 2021-06-03 NOTE — Progress Notes (Signed)
Occupational Therapy Treatment Patient Details Name: Jonathan Kane MRN: [id number] DOB: 21-Mar-1946 Today's Date: 06/03/2021    History of present illness 75 y.o. male presenting to ED 8/11 after being found on the floor by a neighbor. CBG >600. En route to ED patient developed R gaze preference and L-sided weakness. Code stroke called. Patient developed seizure and required intubation for airway protection. SBP 229/127 in ED. Patient admitted with acute metabolic encephalopathy with hyperglycemia, hypertensive emergency and new onset seizure with status epilepticus. 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 Extubated 8/15 for HD. Overnight EEG 8/13-8/14 with new onset status epilepticus likely secondary to severe hyperglycemia. PMHx significant for ESRD on HD MWF, DMII, BPH with obstruction, Hep C, and HTN.   OT comments  Patient with good gains toward patient focused goals.  Patient continues with left inattention and L hemiparesis, but moving better today.  He is needing up to Min A for bed mobility, and up to Min A via hand held assist for standing, and pivot to recliner this date.  Deficits are listed to below.  Seated grooming task completed with setup and no cueing for thoroughness.  Patient and family have agreed to SNF placement for post acute rehab.  OT will continue to follow him in the acute setting and look to advance ADL independence now that mobility is improving.    Follow Up Recommendations  SNF    Equipment Recommendations    Shower chair   Recommendations for Other Services      Precautions / Restrictions Precautions Precautions: Fall Precaution Comments: L inattention Restrictions Weight Bearing Restrictions: No       Mobility Bed Mobility Overal bed mobility: Needs Assistance Bed Mobility: Supine to Sit   Sidelying to sit: [facility] elevated;Min assist         Patient Response: Cooperative  Transfers Overall transfer level: Needs assistance    Transfers: Sit to/from Stand;Stand Pivot Transfers Sit to Stand: Min assist Stand pivot transfers: Min assist       General transfer comment: HHA    Balance Overall balance assessment: Needs assistance Sitting-balance support: Feet supported Sitting balance-Leahy Scale: Fair Sitting balance - Comments: no LOB noted this date and no pushing.  Able to achieve and maintain midline sitting without assist   Standing balance support: Bilateral upper extremity supported Standing balance-Leahy Scale: Poor Standing balance comment: L LE weakness noted, shuffle steps to recliner.  HHA                           ADL either performed or assessed with clinical judgement   ADL       Grooming: Wash/dry hands;Wash/dry face;Sitting;Set up                                       Vision Patient Visual Report: No change from baseline Vision Assessment?: No apparent visual deficits   Perception  Inattention to L   Praxis  Decreased initiation    Cognition Arousal/Alertness: Awake/alert Behavior During Therapy: WFL for tasks assessed/performed                           Following Commands: Follows one step commands with increased time   Awareness: Emergent Problem Solving: Requires verbal cues;Slow processing          Exercises  Shoulder Instructions       General Comments      Pertinent Vitals/ Pain       Faces Pain Scale: Hurts whole lot Pain Location: headache to R temporal area Pain Descriptors / Indicators: Stabbing;Sharp Pain Intervention(s): Patient requesting pain meds-RN notified                                                          Frequency  Min 2X/week        Progress Toward Goals  OT Goals(current goals can now be found in the care plan section)        Plan Discharge plan needs to be updated    Co-evaluation                 AM-PAC OT "6 Clicks" Daily Activity      Outcome Measure   Help from another person eating meals?: A Little Help from another person taking care of personal grooming?: A Little Help from another person toileting, which includes using toliet, bedpan, or urinal?: A Lot Help from another person bathing (including washing, rinsing, drying)?: A Lot Help from another person to put on and taking off regular upper body clothing?: A Little Help from another person to put on and taking off regular lower body clothing?: A Lot 6 Click Score: 15    End of Session Equipment Utilized During Treatment: Gait belt  OT Visit Diagnosis: Unsteadiness on feet (R26.81);Muscle weakness (generalized) (M62.81);Hemiplegia and hemiparesis;Pain Hemiplegia - Right/Left: Left Hemiplegia - caused by: Cerebral infarction Pain - part of body:  (headache)   Activity Tolerance Patient tolerated treatment well;Patient limited by lethargy   Patient Left in chair;with call bell/phone within reach;with chair alarm set   Nurse Communication Other (comment) (somethng for a headache)        Time: [id number] OT Time Calculation (min): 15 min  Charges: OT General Charges $OT Visit: 1 Visit OT Treatments $Self Care/Home Management : 8-22 mins  06/03/2021  Rich, OTR/L  Acute Rehabilitation Services  Office:  [phone]    Metta Clines 06/03/2021, 2:53 PM

## 2021-06-03 NOTE — TOC Initial Note (Signed)
Transition of Care [facility]) - Initial/Assessment Note    Patient Details  Name: Jonathan Kane MRN: [id number] Date of Birth: December 17, 1945  Transition of Care [facility]) CM/SW Contact:    Geralynn Ochs, LCSW Phone Number: 06/03/2021, 12:46 PM  Clinical Narrative:       CSW sent referral to [facility] and confirmed they could accept patient when medically stable. CSW met with patient to discuss with him recommendation for SNF, and patient is agreeable. CSW discussed need to determine transportation to HD while at SNF, as he was previously driving himself, and patient is agreeable to completing [employer] application. CSW to follow.            Expected Discharge Plan: Skilled Nursing Facility Barriers to Discharge: Continued Medical Work up, Ship broker   Patient Goals and CMS Choice Patient states their goals for this hospitalization and ongoing recovery are:: to get better CMS Medicare.gov Compare Post Acute Care list provided to:: Patient Choice offered to / list presented to : Patient  Expected Discharge Plan and Services Expected Discharge Plan: [place] Choice: [place] arrangements for the past 2 months: Single Family Home                                      Prior Living Arrangements/Services Living arrangements for the past 2 months: Single Family Home Lives with:: Self Patient language and need for interpreter reviewed:: No Do you feel safe going back to the place where you live?: Yes      Need for Family Participation in Patient Care: No (Comment) Care giver support system in place?: No (comment)   Criminal Activity/Legal Involvement Pertinent to Current Situation/Hospitalization: No - Comment as needed  Activities of Daily Living      Permission Sought/Granted Permission sought to share information with : Facility Sport and exercise psychologist, Family Supports Permission granted to share  information with : Yes, Verbal Permission Granted  Share Information with NAME: Clarene Critchley  Permission granted to share info w AGENCY: SNF  Permission granted to share info w Relationship: Daughter     Emotional Assessment Appearance:: Appears stated age Attitude/Demeanor/Rapport: Engaged Affect (typically observed): Appropriate Orientation: : Oriented to Self, Oriented to Place, Oriented to  Time, Oriented to Situation Alcohol / Substance Use: Not Applicable Psych Involvement: No (comment)  Admission diagnosis:  Status epilepticus ([place]) [G40.901] DKA (diabetic ketoacidosis) ([place]) [E11.10] Endotracheal tube present [Z97.8] Diabetic ketoacidosis without coma associated with other specified diabetes mellitus ([place]) [E13.10] Patient Active Problem List   Diagnosis Date Noted   MRSA bacteremia 06/02/2021   Malnutrition of moderate degree 06/01/2021   DKA (diabetic ketoacidosis) ([place]) 05/28/2021   Status epilepticus ([place])    Memory loss 04/13/2021   Cerebrovascular accident (CVA) ([place]) 01/12/2021   DM type 2 with diabetic peripheral neuropathy ([place]) 01/12/2021   Normal anion gap metabolic acidosis 40/98/1191   Secondary hyperparathyroidism of renal origin ([place]) 47/82/9562   Acute metabolic encephalopathy 13/05/6577   Acute on chronic renal failure ([place]) 11/07/2019   CKD (chronic kidney disease) stage 5, GFR less than 15 ml/min ([place]) 09/02/2019   CKD (chronic kidney disease) stage 4, GFR 15-29 ml/min ([place]) 08/01/2019   Paronychia of fifth toe of left foot 05/29/2019   Microalbuminuria due to type 2 diabetes mellitus ([place]) 02/14/2019   Posterior vitreous detachment of both eyes 01/17/2019   Presbyopia of  both eyes 01/17/2019   Pseudophakia of both eyes 01/17/2019   Abnormal finding on diagnostic imaging of right testicle 09/14/2018   Epididymitis, left 09/14/2018   Swelling of left testicle 08/23/2018   Primary open angle glaucoma (POAG) of both eyes, mild stage 01/02/2018   Benign  hypertension with chronic kidney disease, stage V ([place]) 11/30/2017   Proteinuria 11/30/2017   Mouth burn, initial encounter 11/10/2017   Vitamin D deficiency, unspecified 11/10/2017   Erectile dysfunction associated with type 2 diabetes mellitus ([place]) 08/29/2017   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 ([place]) 08/16/2017   Hypoglycemia 08/16/2017   PCO (posterior capsular opacification), left 03/03/2017   Hypermetropia of both eyes 03/03/2017   Cocaine dependence in remission ([place]) 07/26/2016   Herpes simplex infection 07/26/2016   CKD stage 4 due to type 2 diabetes mellitus ([place]) 07/07/2016   Noncompliance with diabetes treatment 07/07/2016   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 03/21/2016   Combined arterial insufficiency and corporo-venous occlusive erectile dysfunction 03/04/2016   Allergic rhinitis with postnasal drip 02/24/2016   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   Type 2 diabetes mellitus ([place]) 02/23/2016   Essential hypertension 02/23/2016   Musculoskeletal chest pain 02/23/2016   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[place]) 12/09/2015   Polyneuropathy, unspecified 11/12/2013   Constipation 03/23/2013   Hallucinations 03/23/2013   Polyneuropathy in diabetes ([place]) 03/23/2013   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[place]) 08/30/2011   Diabetes mellitus type II, controlled ([place]) 08/30/2011   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   PCP:  Cipriano Mile, NP Pharmacy:   [facility] [place], [place] - [address] [place] [address] Phone: [phone] Fax: [phone]     Social Determinants of Health (SDOH) Interventions    Readmission Risk Interventions No flowsheet data found.

## 2021-06-04 ENCOUNTER — Encounter (HOSPITAL_COMMUNITY)
Admission: EM | Disposition: A | Discharge status: Skilled Nursing Facility | Payer: Self-pay | Source: Home / Self Care | Attending: Internal Medicine

## 2021-06-04 ENCOUNTER — Encounter (HOSPITAL_COMMUNITY): Payer: Self-pay | Admitting: Pulmonary Disease

## 2021-06-04 ENCOUNTER — Inpatient Hospital Stay (HOSPITAL_COMMUNITY): Payer: Medicare Other | Admitting: Anesthesiology

## 2021-06-04 ENCOUNTER — Inpatient Hospital Stay (HOSPITAL_COMMUNITY): Payer: Medicare Other

## 2021-06-04 DIAGNOSIS — E1310 Other specified diabetes mellitus with ketoacidosis without coma: Secondary | ICD-10-CM | POA: Diagnosis not present

## 2021-06-04 DIAGNOSIS — I10 Essential (primary) hypertension: Secondary | ICD-10-CM

## 2021-06-04 DIAGNOSIS — R7881 Bacteremia: Secondary | ICD-10-CM

## 2021-06-04 DIAGNOSIS — B9562 Methicillin resistant Staphylococcus aureus infection as the cause of diseases classified elsewhere: Secondary | ICD-10-CM | POA: Diagnosis not present

## 2021-06-04 DIAGNOSIS — I517 Cardiomegaly: Secondary | ICD-10-CM

## 2021-06-04 HISTORY — PX: TEE WITHOUT CARDIOVERSION: SHX5443

## 2021-06-04 LAB — CULTURE, BLOOD (ROUTINE X 2): Special Requests: ADEQUATE

## 2021-06-04 LAB — GLUCOSE, CAPILLARY
Glucose-Capillary: 113 mg/dL — ABNORMAL HIGH (ref 70–99)
Glucose-Capillary: 129 mg/dL — ABNORMAL HIGH (ref 70–99)
Glucose-Capillary: 295 mg/dL — ABNORMAL HIGH (ref 70–99)
Glucose-Capillary: 43 mg/dL — CL (ref 70–99)
Glucose-Capillary: 85 mg/dL (ref 70–99)

## 2021-06-04 LAB — ECHO TEE
AV Mean grad: 4 mmHg
AV Peak grad: 7.3 mmHg
Ao pk vel: 1.35 m/s

## 2021-06-04 SURGERY — ECHOCARDIOGRAM, TRANSESOPHAGEAL
Anesthesia: Monitor Anesthesia Care

## 2021-06-04 MED ORDER — PROPOFOL 10 MG/ML IV BOLUS
INTRAVENOUS | Status: DC | PRN
  Administered 2021-06-04 (×2): 20 mg via INTRAVENOUS
  Administered 2021-06-04: 15 mg via INTRAVENOUS

## 2021-06-04 MED ORDER — DEXMEDETOMIDINE HCL IN NACL 200 MCG/50ML IV SOLN
INTRAVENOUS | Status: DC | PRN
  Administered 2021-06-04: 8 ug via INTRAVENOUS

## 2021-06-04 MED ORDER — SODIUM CHLORIDE 0.9 % IV SOLN: INTRAVENOUS | Status: DC

## 2021-06-04 MED ORDER — INSULIN ASPART 100 UNIT/ML IJ SOLN
0.0000 [IU] | Freq: Three times a day (TID) | INTRAMUSCULAR | Status: DC
  Administered 2021-06-05: 1 [IU] via SUBCUTANEOUS
  Administered 2021-06-05: 2 [IU] via SUBCUTANEOUS
  Administered 2021-06-05: 5 [IU] via SUBCUTANEOUS
  Administered 2021-06-06: 4 [IU] via SUBCUTANEOUS
  Administered 2021-06-06: 1 [IU] via SUBCUTANEOUS
  Administered 2021-06-06: 2 [IU] via SUBCUTANEOUS
  Administered 2021-06-07: 3 [IU] via SUBCUTANEOUS
  Administered 2021-06-08: 4 [IU] via SUBCUTANEOUS
  Administered 2021-06-08: 2 [IU] via SUBCUTANEOUS

## 2021-06-04 MED ORDER — VANCOMYCIN HCL IN DEXTROSE 750-5 MG/150ML-% IV SOLN
750.0000 mg | Freq: Once | INTRAVENOUS | Status: AC
  Administered 2021-06-04: 750 mg via INTRAVENOUS
  Filled 2021-06-04: qty 150

## 2021-06-04 MED ORDER — DEXTROSE 50 % IV SOLN
25.0000 g | INTRAVENOUS | Status: AC
  Administered 2021-06-04: 25 g via INTRAVENOUS
  Filled 2021-06-04: qty 50

## 2021-06-04 MED ORDER — LIDOCAINE 2% (20 MG/ML) 5 ML SYRINGE
INTRAMUSCULAR | Status: DC | PRN
  Administered 2021-06-04: 60 mg via INTRAVENOUS

## 2021-06-04 MED ORDER — PROPOFOL 500 MG/50ML IV EMUL
INTRAVENOUS | Status: DC | PRN
  Administered 2021-06-04: 100 ug/kg/min via INTRAVENOUS

## 2021-06-04 MED ORDER — BUTAMBEN-TETRACAINE-BENZOCAINE 2-2-14 % EX AERO
INHALATION_SPRAY | CUTANEOUS | Status: DC | PRN
  Administered 2021-06-04: 2 via TOPICAL

## 2021-06-04 NOTE — Progress Notes (Signed)
[facility]    Date of Admission:  05/28/2021                                   Total days of antibiotics 4                                                                                     Vancomycin                                                                                      Zosyn stopped 06/02/2021                                                                                                                                       Reason for Consult: MRSA bacteremia                                  Referring Provider: Jennye Boroughs, MD Primary Care Provider: Cipriano Mile, NP  Assessment: Jonathan Kane is a 75 y.o. male with a past medical history of T2DM, ESRD on HD, hypertension, remote history of cocaine use disorder, BPH, alcohol use disorder hepatitis C and HSV, who admitted on 05/28/2021 for acute metabolic encephalopathy in the setting of seizure from severe hyperglycemia and hypertensive emergency. MRI with bilateral multifocal punctate ischemia due to possible embolic source vs watershed infarct in setting of dehydration from DKA vs post ictal changes.    Noted to be tachycardic and febrile to 102.8 on 06/01/2021. Blood cultures drawn and started on vancomycin and zosyn on 06/01/2021. Blood cultures with MRSA on BCID, aerobic bottles with gram positive cocci in clusters. TTE on 06/01/2021 for stroke workup with EF of 60-65% with grade I diastolic dysfunction, mild to moderate aortic valve sclerosis/calcification. No evidence of vegetations. Repeat MRI 06/01/2021 unchanged from prior study. Repeat cultures remain negative.    Patient is ESRD on HD MWF has been using RTDC for the past several months due to issues with left AVF. Had left IJ placed on 05/28/2021 on admission. Suspect  catheter associated MRSA bacteremia either from central line or dialysis catheter. Right TDC removed on 06/03/2021, plan for dialysis today with left AVF per  nephrology. Infarcts on MRI may be due to septic emboli in the absence of atrial fibrillation. Plan for TEE today.   Plan: Continue on Vancomycin per pharmacy, will likely need 6 weeks of antibiotics with HD Follow up TEE today  Principal Problem:   MRSA bacteremia Active Problems:   DKA (diabetic ketoacidosis) ([place])   Status epilepticus ([place])   Malnutrition of moderate degree    [MAR Hold] amLODipine  5 mg Oral Daily   [MAR Hold] aspirin  81 mg Oral Daily   [MAR Hold] atorvastatin  40 mg Oral QHS   [MAR Hold] Chlorhexidine Gluconate Cloth  6 each Topical Daily   [MAR Hold] cholecalciferol  1,000 Units Oral Daily   [MAR Hold] diltiazem  180 mg Oral Daily   [MAR Hold] feeding supplement (NEPRO CARB STEADY)  237 mL Oral BID BM   [MAR Hold] heparin injection (subcutaneous)  5,000 Units Subcutaneous Q8H   [MAR Hold] hydrALAZINE  25 mg Oral Q8H   [MAR Hold] insulin aspart  0-6 Units Subcutaneous TID WC   [MAR Hold] insulin glargine-yfgn  20 Units Subcutaneous Daily   [MAR Hold] latanoprost  1 drop Both Eyes QHS   [MAR Hold] multivitamin  1 tablet Oral QHS   HPI: Jonathan Kane is a 75 y.o. male with a past medical history of T2DM, ESRD on HD, hypertension, remote history of cocaine use disorder, BPH, alcohol use disorder hepatitis C and HSV, who admitted on 05/28/2021 for acute metabolic encephalopathy in the setting of severe hyperglycemia and hypertensive emergency with new onset seizures. MRI with multiple punctate bilateral  cerebral infarcts  on 05/30/2021. Intubated and admitted to the ICU. Mental status subsequently improved and was extubated on 05/31/2021.   Patient reports intermittent fevers since yesterday but state he is otherwise feeling well.  Unable to recall event leading up to admission well, but state he was in his usual state of health prior to admission. Does not an  occasional cough today and bilateral groin pain earlier today but resolved with tylenol.  Does not he has a  callus between his second and third toe of his left foot. States it has been present since surgery on that toe several years ago. Reports he shaves it every few weeks and notes some bleeding when he too deeply a few weeks ago. Denies any pain, warmth or drainage from the toe. Denies any fever, chills, dyspnea, nausea, vomiting, abdominal pain, back pain, joint pain.  Review of Systems: Review of Systems  Constitutional:  Negative for chills and fever.  HENT:  Negative for sinus pain and sore throat.   Eyes:  Negative for pain and discharge.  Respiratory:  Positive for cough. Negative for shortness of breath and wheezing.   Cardiovascular:  Negative for chest pain and leg swelling.  Gastrointestinal:  Negative for abdominal pain, nausea and vomiting.  Genitourinary:  Negative for dysuria and flank pain.  Musculoskeletal:  Negative for joint pain and neck pain.  Skin:  Negative for itching and rash.  Neurological:  Positive for headaches. Negative for dizziness and weakness.   Past Medical History:  Diagnosis Date   Diabetes mellitus without complication ([place])    End stage kidney disease ([place])    Erectile dysfunction    Hypertension    Vitamin D deficiency     Social History   Tobacco Use  Smoking status: Former   Smokeless tobacco: Never  Scientific laboratory technician Use: Never used  Substance Use Topics   Alcohol use: Yes    Comment: very litte - occasional beer   Drug use: No    Family History  Problem Relation Age of Onset   Ovarian cancer Mother    Other Father        unsure of medical history   Allergies  Allergen Reactions   Dulaglutide     TRULICITY:  GI Upset intolerance    OBJECTIVE: Blood pressure (!) 157/76, pulse 90, temperature 97.9 F (36.6 C), temperature source Temporal, resp. rate 16, height '5\' 10"'$  (1.778 m), weight 61.5 kg, SpO2 96 %.  Physical Exam Constitutional:      Appearance: Normal appearance.     Comments: Uncomfortable, holding head due to  headache  HENT:     Head: Normocephalic and atraumatic.     Mouth/Throat:     Mouth: Mucous membranes are moist.     Pharynx: Oropharynx is clear.  Eyes:     Extraocular Movements: Extraocular movements intact.     Conjunctiva/sclera: Conjunctivae normal.     Pupils: Pupils are equal, round, and reactive to light.  Cardiovascular:     Rate and Rhythm: Normal rate and regular rhythm.     Pulses: Normal pulses.  Pulmonary:     Effort: Pulmonary effort is normal. No respiratory distress.     Breath sounds: Normal breath sounds.  Abdominal:     General: Abdomen is flat. Bowel sounds are normal.     Palpations: Abdomen is soft.  Musculoskeletal:     Cervical back: Normal range of motion and neck supple. No rigidity.     Right lower leg: No edema.     Left lower leg: No edema.  Skin:    General: Skin is warm and dry.     Capillary Refill: Capillary refill takes less than 2 seconds.  Neurological:     General: No focal deficit present.     Mental Status: He is alert and oriented to person, place, and time. Mental status is at baseline.    Lab Results Lab Results  Component Value Date   WBC 12.8 (H) 06/03/2021   HGB 10.6 (L) 06/03/2021   HCT 31.3 (L) 06/03/2021   MCV 91.0 06/03/2021   PLT 177 06/03/2021    Lab Results  Component Value Date   CREATININE 6.26 (H) 06/03/2021   BUN 46 (H) 06/03/2021   NA 134 (L) 06/03/2021   K 3.9 06/03/2021   CL 97 (L) 06/03/2021   CO2 23 06/03/2021    Lab Results  Component Value Date   ALT 63 (H) 06/02/2021   AST 74 (H) 06/02/2021   ALKPHOS 90 06/02/2021   BILITOT 0.8 06/02/2021     Microbiology: Recent Results (from the past 240 hour(s))  Resp Panel by RT-PCR (Flu A&B, Covid) Nasopharyngeal Swab     Status: None   Collection Time: 05/28/21  1:09 PM   Specimen: Nasopharyngeal Swab; Nasopharyngeal(NP) swabs in vial transport medium  Result Value Ref Range Status   SARS Coronavirus 2 by RT PCR NEGATIVE NEGATIVE Final    Comment:  (NOTE) SARS-CoV-2 target nucleic acids are NOT DETECTED.  The SARS-CoV-2 RNA is generally detectable in upper respiratory specimens during the acute phase of infection. The lowest concentration of SARS-CoV-2 viral copies this assay can detect is 138 copies/mL. A negative result does not preclude SARS-Cov-2 infection and should not be used  as the sole basis for treatment or other patient management decisions. A negative result may occur with  improper specimen collection/handling, submission of specimen other than nasopharyngeal swab, presence of viral mutation(s) within the areas targeted by this assay, and inadequate number of viral copies(<138 copies/mL). A negative result must be combined with clinical observations, patient history, and epidemiological information. The expected result is Negative.  Fact Sheet for Patients:  EntrepreneurPulse.com.au  Fact Sheet for Healthcare Providers:  IncredibleEmployment.be  This test is no t yet approved or cleared by the [place] FDA and  has been authorized for detection and/or diagnosis of SARS-CoV-2 by FDA under an Emergency Use Authorization (EUA). This EUA will remain  in effect (meaning this test can be used) for the duration of the COVID-19 declaration under Section 564(b)(1) of the Act, 21 U.S.C.section 360bbb-3(b)(1), unless the authorization is terminated  or revoked sooner.       Influenza A by PCR NEGATIVE NEGATIVE Final   Influenza B by PCR NEGATIVE NEGATIVE Final    Comment: (NOTE) The Xpert Xpress SARS-CoV-2/FLU/RSV plus assay is intended as an aid in the diagnosis of influenza from Nasopharyngeal swab specimens and should not be used as a sole basis for treatment. Nasal washings and aspirates are unacceptable for Xpert Xpress SARS-CoV-2/FLU/RSV testing.  Fact Sheet for Patients: EntrepreneurPulse.com.au  Fact Sheet for Healthcare  Providers: IncredibleEmployment.be  This test is not yet approved or cleared by the [place] FDA and has been authorized for detection and/or diagnosis of SARS-CoV-2 by FDA under an Emergency Use Authorization (EUA). This EUA will remain in effect (meaning this test can be used) for the duration of the COVID-19 declaration under Section 564(b)(1) of the Act, 21 U.S.C. section 360bbb-3(b)(1), unless the authorization is terminated or revoked.  Performed at [facility], [place] [address] [place], [place] [address]   MRSA Next Gen by PCR, Nasal     Status: Abnormal   Collection Time: 05/28/21  4:55 PM   Specimen: Nasal Mucosa; Nasal Swab  Result Value Ref Range Status   MRSA by PCR Next Gen DETECTED (A) NOT DETECTED Final    Comment: RESULT CALLED TO, READ BACK BY AND VERIFIED WITH: B Tora Kindred RN 1921 05/28/21 A BROWNING (NOTE) The GeneXpert MRSA Assay (FDA approved for NASAL specimens only), is one component of a comprehensive MRSA colonization surveillance program. It is not intended to diagnose MRSA infection nor to guide or monitor treatment for MRSA infections. Test performance is not FDA approved in patients less than 31 years old. Performed at [facility], [place] [address], [place], [place] [address]   Culture, blood (x 2)     Status: Abnormal   Collection Time: 06/01/21 11:29 AM   Specimen: BLOOD RIGHT ARM  Result Value Ref Range Status   Specimen Description BLOOD RIGHT ARM  Final   Special Requests   Final    BOTTLES DRAWN AEROBIC AND ANAEROBIC Blood Culture adequate volume   Culture  Setup Time   Final    GRAM POSITIVE COCCI IN CLUSTERS AEROBIC BOTTLE ONLY CRITICAL RESULT CALLED TO, READ BACK BY AND VERIFIED WITH: pharmd Seward Meth 06/02/2021 '@1056'$  by Blima Rich Performed at [facility], [place] [address], [place], [place] [address]    Culture METHICILLIN RESISTANT STAPHYLOCOCCUS AUREUS (A)  Final   Report Status 06/04/2021 FINAL   Final   Organism ID, Bacteria METHICILLIN RESISTANT STAPHYLOCOCCUS AUREUS  Final      Susceptibility   Methicillin resistant staphylococcus aureus -  MIC*    CIPROFLOXACIN >=8 RESISTANT Resistant     ERYTHROMYCIN >=8 RESISTANT Resistant     GENTAMICIN <=0.5 SENSITIVE Sensitive     OXACILLIN >=4 RESISTANT Resistant     TETRACYCLINE <=1 SENSITIVE Sensitive     VANCOMYCIN <=0.5 SENSITIVE Sensitive     TRIMETH/SULFA <=10 SENSITIVE Sensitive     CLINDAMYCIN <=0.25 SENSITIVE Sensitive     RIFAMPIN <=0.5 SENSITIVE Sensitive     Inducible Clindamycin NEGATIVE Sensitive     * METHICILLIN RESISTANT STAPHYLOCOCCUS AUREUS  Blood Culture ID Panel (Reflexed)     Status: Abnormal   Collection Time: 06/01/21 11:29 AM  Result Value Ref Range Status   Enterococcus faecalis NOT DETECTED NOT DETECTED Final   Enterococcus Faecium NOT DETECTED NOT DETECTED Final   Listeria monocytogenes NOT DETECTED NOT DETECTED Final   Staphylococcus species DETECTED (A) NOT DETECTED Final    Comment: CRITICAL RESULT CALLED TO, READ BACK BY AND VERIFIED WITH: pharmd Seward Meth 06/02/2021 '@1056'$  by Blima Rich    Staphylococcus aureus (BCID) DETECTED (A) NOT DETECTED Final    Comment: Methicillin (oxacillin)-resistant Staphylococcus aureus (MRSA). MRSA is predictably resistant to beta-lactam antibiotics (except ceftaroline). Preferred therapy is vancomycin unless clinically contraindicated. Patient requires contact precautions if  hospitalized. CRITICAL RESULT CALLED TO, READ BACK BY AND VERIFIED WITH: pharmd Seward Meth 06/02/2021 '@1056'$  by Blima Rich    Staphylococcus epidermidis NOT DETECTED NOT DETECTED Final   Staphylococcus lugdunensis NOT DETECTED NOT DETECTED Final   Streptococcus species NOT DETECTED NOT DETECTED Final   Streptococcus agalactiae NOT DETECTED NOT DETECTED Final   Streptococcus pneumoniae NOT DETECTED NOT DETECTED Final   Streptococcus pyogenes NOT DETECTED NOT DETECTED Final   A.calcoaceticus-baumannii NOT DETECTED NOT  DETECTED Final   Bacteroides fragilis NOT DETECTED NOT DETECTED Final   Enterobacterales NOT DETECTED NOT DETECTED Final   Enterobacter cloacae complex NOT DETECTED NOT DETECTED Final   Escherichia coli NOT DETECTED NOT DETECTED Final   Klebsiella aerogenes NOT DETECTED NOT DETECTED Final   Klebsiella oxytoca NOT DETECTED NOT DETECTED Final   Klebsiella pneumoniae NOT DETECTED NOT DETECTED Final   Proteus species NOT DETECTED NOT DETECTED Final   Salmonella species NOT DETECTED NOT DETECTED Final   Serratia marcescens NOT DETECTED NOT DETECTED Final   Haemophilus influenzae NOT DETECTED NOT DETECTED Final   Neisseria meningitidis NOT DETECTED NOT DETECTED Final   Pseudomonas aeruginosa NOT DETECTED NOT DETECTED Final   Stenotrophomonas maltophilia NOT DETECTED NOT DETECTED Final   Candida albicans NOT DETECTED NOT DETECTED Final   Candida auris NOT DETECTED NOT DETECTED Final   Candida glabrata NOT DETECTED NOT DETECTED Final   Candida krusei NOT DETECTED NOT DETECTED Final   Candida parapsilosis NOT DETECTED NOT DETECTED Final   Candida tropicalis NOT DETECTED NOT DETECTED Final   Cryptococcus neoformans/gattii NOT DETECTED NOT DETECTED Final   Meth resistant mecA/C and MREJ DETECTED (A) NOT DETECTED Final    Comment: CRITICAL RESULT CALLED TO, READ BACK BY AND VERIFIED WITH: pharmd Seward Meth 06/02/2021 '@1056'$  by Blima Rich Performed at [facility], [address], [place], [place] [address]   Culture, blood (x 2)     Status: None (Preliminary result)   Collection Time: 06/01/21 11:47 AM   Specimen: BLOOD  Result Value Ref Range Status   Specimen Description BLOOD BLOOD RIGHT HAND  Final   Special Requests   Final    BOTTLES DRAWN AEROBIC AND ANAEROBIC Blood Culture results may not be optimal due to an inadequate volume of blood received in culture  bottles   Culture   Final    NO GROWTH 3 DAYS Performed at [facility], [place] [address], [place], [place] [address]    Report  Status PENDING  Incomplete  Culture, blood (Routine X 2) w Reflex to ID Panel     Status: None (Preliminary result)   Collection Time: 06/03/21  5:03 AM   Specimen: BLOOD RIGHT ARM  Result Value Ref Range Status   Specimen Description BLOOD RIGHT ARM  Final   Special Requests   Final    BOTTLES DRAWN AEROBIC AND ANAEROBIC Blood Culture adequate volume   Culture   Final    NO GROWTH 1 DAY Performed at [facility], [place] [address], [place], [place] [address]    Report Status PENDING  Incomplete  Culture, blood (Routine X 2) w Reflex to ID Panel     Status: None (Preliminary result)   Collection Time: 06/03/21  5:15 AM   Specimen: BLOOD RIGHT HAND  Result Value Ref Range Status   Specimen Description BLOOD RIGHT HAND  Final   Special Requests   Final    BOTTLES DRAWN AEROBIC AND ANAEROBIC Blood Culture adequate volume   Culture   Final    NO GROWTH 1 DAY Performed at [facility], [place] [address], [place], [place] [address]    Report Status PENDING  Incomplete    Iona Beard, MD Internal Medicine, PGY-2 Pager: [phone]  06/04/2021 12:39 PM

## 2021-06-04 NOTE — Progress Notes (Signed)
[facility] Progress Note   Assessment/ Plan:   [facility] HD: [facility] M[facility]      400/ 1.5  70.5kg   2/2.5      Assessment/ Plan: Acute encephalopathy - w/ uncont diabetes (BS > 600), HTN emergency and new seizures.  Getting IV insulin and BP meds. Neuro consulting--> suspect hyperglycemia as cause of seizures. On Keppra.  MRI 05/30/21 with punctate areas of infarct- appear embolic vs watershed per neuro note.  Repeat MRI negative 06/01/21.     Acute embolic cerebral infarctions: 30 day monitor recommended per neuro MRSA bacteremia: on vanc, s/p RIJ TDC removal 8/18 with IR for line holiday. ID on board, TEE planned for today ESRD - on HD MWF. Had HD Sat off schedule, Now back on MWF schedule-HD today, 06/04/21  Has AVF which was last intervened upon 06/2020--> widely patent AVF, has collaterals that may be interfering with cannulation. Will attempt to cannulate his AVF moving forward--> pt reports that dialysis center keeps pulling clots from it. Acute hypoxic RF- extubated, on RA DKA - sp insulin gtt, back on SQ insulin now.  HTN: on 3 BP lowering meds with soft BP--> off ACEi, on hydralazine 25 mg TID and amlodipine 5 mg daily, cardizem added back at 180 mg daily  Dispo: pending  Subjective:    Getting TEE today   Objective:   BP (!) 161/76 (BP Location: Right Arm)   Pulse (!) 103   Temp 98.3 F (36.8 C) (Oral)   Resp 16   Ht '5\' 10"'$  (1.778 m)   Wt 61.5 kg   SpO2 96%   BMI 19.45 kg/m   Physical Exam: Gen: NAD, laying flat in bed CVS: RRR, II/VI systolic murmur Resp: clear anteriorly Abd: thin Ext: no LE edema ACCESS: L RC AVF pulsatile  Labs: BMET Recent Labs  Lab 05/29/21 0539 05/29/21 1326 05/29/21 1710 05/30/21 0445 05/30/21 1754 05/31/21 0416 06/01/21 0316 06/01/21 1147 06/02/21 0041 06/03/21 0502  NA 139  --   --  134*  --  135 136 135 135 134*  K 3.5  --   --  3.8  --  3.8 4.3 4.4 4.5 3.9  CL 102  --   --  100  --  100 99 97* 98 97*  CO2 23  --   --   22  --  21* '24 23 22 23  '$ GLUCOSE 143*  --   --  251*  --  244* 130* 157* 157* 99  BUN 64*  --   --  43*  --  64* 28* 35* 56* 46*  CREATININE 10.04*  --   --  7.47*  --  8.88* 5.47* 6.53* 7.61* 6.26*  CALCIUM 8.4*  --   --  7.6*  --  7.7* 8.1* 8.0* 8.3* 9.0  PHOS  --  2.8 3.2 4.4 4.2 4.3 4.5  --   --   --    CBC Recent Labs  Lab 05/31/21 0416 06/01/21 0314 06/01/21 1147 06/02/21 0041 06/03/21 0502  WBC 7.9 12.9* 13.0* 13.3* 12.8*  NEUTROABS 5.6  --  11.1* 10.4* 9.7*  HGB 10.8* 11.3* 11.0* 11.9* 10.6*  HCT 32.0* 33.7* 33.2* 34.6* 31.3*  MCV 91.7 92.6 93.0 92.5 91.0  PLT 134* 145* 150 157 177      Medications:     amLODipine  5 mg Oral Daily   aspirin  81 mg Oral Daily   atorvastatin  40 mg Oral QHS   Chlorhexidine Gluconate Cloth  6 each Topical Daily   cholecalciferol  1,000 Units Oral Daily   diltiazem  180 mg Oral Daily   feeding supplement (NEPRO CARB STEADY)  237 mL Oral BID BM   heparin injection (subcutaneous)  5,000 Units Subcutaneous Q8H   hydrALAZINE  25 mg Oral Q8H   insulin aspart  0-6 Units Subcutaneous TID WC   insulin glargine-yfgn  20 Units Subcutaneous Daily   latanoprost  1 drop Both Eyes QHS   multivitamin  1 tablet Oral QHS     Madelon Lips MD [facility] 06/04/2021, 11:11 AM

## 2021-06-04 NOTE — Progress Notes (Addendum)
Progress Note    Jonathan Kane  [id number] DOB: [birth year]  DOA: 05/28/2021 PCP: Cipriano Mile, NP      Brief Narrative:    Medical records reviewed and are as summarized below:  WYNTON ELSTAD is a 75 y.o. male with medical history significant for type II DM, ESRD on hemodialysis, hypertension, substance use disorder, alcohol use disorder, hepatitis C infection, HSV infection, BPH.  He was brought to the hospital because of unresponsiveness and was found to have severe hyperglycemia and hypertensive emergency.  He developed new onset seizures with status epilepticus.  He was intubated and placed on mechanical ventilation.    Neurologist was consulted to assist with management.  He was started on Keppra.      Assessment/Plan:   Principal Problem:   MRSA bacteremia Active Problems:   DKA (diabetic ketoacidosis) ([place])   Status epilepticus ([place])   Malnutrition of moderate degree   Nutrition Problem: Moderate Malnutrition Etiology: chronic illness (ESRD on HD, DM)  Signs/Symptoms: mild fat depletion, moderate fat depletion, mild muscle depletion, moderate muscle depletion   Body mass index is 19.45 kg/m.   Acute metabolic encephalopathy, possible subacute stroke in both cerebral hemispheres: Continue aspirin and Lipitor.  Headache: Analgesics as needed for pain  Hypertension with recent hypertensive emergency: Continue antihypertensives.  2D echo showed EF estimated at 60 to 65%, severe LVH, grade 1 diastolic dysfunction  New seizures with status epilepticus: Keppra has been discontinued by the neurologist since seizure was provoked. Video EEG did not show any seizures or definitive epileptiform discharges.  There were changes suggestive of cortical dysfunction in the right hemisphere.  MRSA sepsis secondary to MRSA bacteremia: Procalcitonin was 5.92 and 6.13.  1 out of 4 culture bottles positive for MRSA.  Continue IV vancomycin.  Dialysis permacath was  removed from the right upper chest on 06/03/2021. Plan for TEE today. No growth on repeat blood cultures thus far.      ESRD: Follow-up with nephrologist for hemodialysis.  IDDM with hyperglycemia and hypoglycemia: Hemoglobin A1c is 11.5.  Glucose was 43 today.  Decrease NovoLog sliding scale.  Continue insulin glargine.  Monitor glucose levels closely.    Acute hypoxic respiratory failure: S/p extubation on 05/31/2021.  He is tolerating room air.  Diet Order             Diet NPO time specified Except for: Sips with Meds  Diet effective midnight                      Consultants: Neurologist Nephrologist Intensivist  Procedures: Video EEG    Medications:    amLODipine  5 mg Oral Daily   aspirin  81 mg Oral Daily   atorvastatin  40 mg Oral QHS   Chlorhexidine Gluconate Cloth  6 each Topical Daily   cholecalciferol  1,000 Units Oral Daily   diltiazem  180 mg Oral Daily   feeding supplement (NEPRO CARB STEADY)  237 mL Oral BID BM   heparin injection (subcutaneous)  5,000 Units Subcutaneous Q8H   hydrALAZINE  25 mg Oral Q8H   insulin aspart  0-6 Units Subcutaneous TID WC   insulin glargine-yfgn  20 Units Subcutaneous Daily   latanoprost  1 drop Both Eyes QHS   multivitamin  1 tablet Oral QHS   Continuous Infusions:  sodium chloride Stopped (05/31/21 1252)   vancomycin 750 mg (06/02/21 1303)     Anti-infectives (From admission, onward)    Start  Dose/Rate Route Frequency Ordered Stop   06/02/21 1200  vancomycin (VANCOCIN) IVPB 750 mg/150 ml premix        750 mg 150 mL/hr over 60 Minutes Intravenous Every M-W-F (Hemodialysis) 06/01/21 1144     06/01/21 1230  piperacillin-tazobactam (ZOSYN) IVPB 2.25 g  Status:  Discontinued        2.25 g 100 mL/hr over 30 Minutes Intravenous Every 8 hours 06/01/21 1134 06/02/21 1250   06/01/21 1200  metroNIDAZOLE (FLAGYL) IVPB 500 mg  Status:  Discontinued        500 mg 100 mL/hr over 60 Minutes Intravenous Every 8 hours  06/01/21 1103 06/01/21 1125   06/01/21 1200  vancomycin (VANCOREADY) IVPB 1500 mg/300 mL        1,500 mg 150 mL/hr over 120 Minutes Intravenous  Once 06/01/21 1103 06/01/21 1549   06/01/21 1200  piperacillin-tazobactam (ZOSYN) IVPB 2.25 g  Status:  Discontinued        2.25 g 100 mL/hr over 30 Minutes Intravenous Every 8 hours 06/01/21 1125 06/01/21 1134              Family Communication/Anticipated D/C date and plan/Code Status   DVT prophylaxis: heparin injection 5,000 Units Start: 05/28/21 2200     Code Status: Full Code  Family Communication: None Disposition Plan:    Status is: Inpatient  Remains inpatient appropriate because:IV treatments appropriate due to intensity of illness or inability to take PO and Inpatient level of care appropriate due to severity of illness  Dispo: The patient is from: Home              Anticipated d/c is to: CIR              Patient currently is not medically stable to d/c.   Difficult to place patient No           Subjective:   C/o headache.  No dizziness, confusion, chest pain or shortness of breath  Objective:    Vitals:   06/03/21 1554 06/03/21 2047 06/04/21 0500 06/04/21 0831  BP: (!) 108/59 (!) 164/90 (!) 145/84 (!) 161/76  Pulse: 97 99 88 (!) 103  Resp: '18 18 15 16  '$ Temp: 98.6 F (37 C) 99.3 F (37.4 C) 99 F (37.2 C) 98.3 F (36.8 C)  TempSrc: Oral Oral Oral Oral  SpO2: 98% 98% 97% 96%  Weight:      Height:       No data found.   Intake/Output Summary (Last 24 hours) at 06/04/2021 1111 Last data filed at 06/04/2021 0945 Gross per 24 hour  Intake --  Output 800 ml  Net -800 ml   Filed Weights   06/02/21 0500 06/02/21 1030 06/02/21 1353  Weight: 69.2 kg 62.1 kg 61.5 kg    Exam:  GEN: NAD SKIN: Warm and dry EYES: EOMI ENT: MMM CV: RRR PULM: CTA B ABD: soft, ND, NT, +BS CNS: AAO x 3, non focal EXT: No edema or tenderness            Data Reviewed:   I have personally reviewed  following labs and imaging studies:  Labs: Labs show the following:   Basic Metabolic Panel: Recent Labs  Lab 05/29/21 1326 05/29/21 1710 05/30/21 0445 05/30/21 1754 05/31/21 0416 06/01/21 0316 06/01/21 1147 06/02/21 0041 06/03/21 0502  NA  --   --  134*  --  135 136 135 135 134*  K  --   --  3.8  --  3.8 4.3 4.4  4.5 3.9  CL  --   --  100  --  100 99 97* 98 97*  CO2  --   --  22  --  21* '24 23 22 23  '$ GLUCOSE  --   --  251*  --  244* 130* 157* 157* 99  BUN  --   --  43*  --  64* 28* 35* 56* 46*  CREATININE  --   --  7.47*  --  8.88* 5.47* 6.53* 7.61* 6.26*  CALCIUM  --   --  7.6*  --  7.7* 8.1* 8.0* 8.3* 9.0  MG 2.0 2.0 2.2 2.3  --   --   --  2.2  --   PHOS 2.8 3.2 4.4 4.2 4.3 4.5  --   --   --    GFR Estimated Creatinine Clearance: 9.1 mL/min (A) (by C-G formula based on SCr of 6.26 mg/dL (H)). Liver Function Tests: Recent Labs  Lab 05/28/21 1157 05/30/21 0445 05/31/21 0416 06/01/21 0316 06/01/21 1147 06/02/21 0041  AST 36 19  --   --  88* 74*  ALT 20 15  --   --  52* 63*  ALKPHOS 69 47  --   --  70 90  BILITOT 0.8 0.5  --   --  0.9 0.8  PROT 7.4 6.0*  --   --  6.5 6.7  ALBUMIN 4.1 2.9* 2.7* 2.8* 2.7* 2.6*   No results for input(s): LIPASE, AMYLASE in the last 168 hours. No results for input(s): AMMONIA in the last 168 hours. Coagulation profile Recent Labs  Lab 05/28/21 1157 06/01/21 1147  INR 1.1 1.3*    CBC: Recent Labs  Lab 05/28/21 1157 05/28/21 1206 05/31/21 0416 06/01/21 0314 06/01/21 1147 06/02/21 0041 06/03/21 0502  WBC 8.0   < > 7.9 12.9* 13.0* 13.3* 12.8*  NEUTROABS 7.1  --  5.6  --  11.1* 10.4* 9.7*  HGB 11.9*   < > 10.8* 11.3* 11.0* 11.9* 10.6*  HCT 35.2*   < > 32.0* 33.7* 33.2* 34.6* 31.3*  MCV 91.7   < > 91.7 92.6 93.0 92.5 91.0  PLT 146*   < > 134* 145* 150 157 177   < > = values in this interval not displayed.   Cardiac Enzymes: No results for input(s): CKTOTAL, CKMB, CKMBINDEX, TROPONINI in the last 168 hours. BNP (last 3  results) No results for input(s): PROBNP in the last 8760 hours. CBG: Recent Labs  Lab 06/03/21 1736 06/03/21 1937 06/04/21 0026 06/04/21 0117 06/04/21 0834  GLUCAP 297* 343* 43* 129* 113*   D-Dimer: No results for input(s): DDIMER in the last 72 hours. Hgb A1c: No results for input(s): HGBA1C in the last 72 hours.  Lipid Profile: No results for input(s): CHOL, HDL, LDLCALC, TRIG, CHOLHDL, LDLDIRECT in the last 72 hours.  Thyroid function studies: No results for input(s): TSH, T4TOTAL, T3FREE, THYROIDAB in the last 72 hours.  Invalid input(s): FREET3 Anemia work up: No results for input(s): VITAMINB12, FOLATE, FERRITIN, TIBC, IRON, RETICCTPCT in the last 72 hours. Sepsis Labs: Recent Labs  Lab 06/01/21 0314 06/01/21 1129 06/01/21 1147 06/01/21 1402 06/02/21 0041 06/03/21 0502  PROCALCITON  --   --   --   --  5.92 6.13  WBC 12.9*  --  13.0*  --  13.3* 12.8*  LATICACIDVEN  --  1.0  --  1.0  --   --     Microbiology Recent Results (from the past 240 hour(s))  Resp  Panel by RT-PCR (Flu A&B, Covid) Nasopharyngeal Swab     Status: None   Collection Time: 05/28/21  1:09 PM   Specimen: Nasopharyngeal Swab; Nasopharyngeal(NP) swabs in vial transport medium  Result Value Ref Range Status   SARS Coronavirus 2 by RT PCR NEGATIVE NEGATIVE Final    Comment: (NOTE) SARS-CoV-2 target nucleic acids are NOT DETECTED.  The SARS-CoV-2 RNA is generally detectable in upper respiratory specimens during the acute phase of infection. The lowest concentration of SARS-CoV-2 viral copies this assay can detect is 138 copies/mL. A negative result does not preclude SARS-Cov-2 infection and should not be used as the sole basis for treatment or other patient management decisions. A negative result may occur with  improper specimen collection/handling, submission of specimen other than nasopharyngeal swab, presence of viral mutation(s) within the areas targeted by this assay, and inadequate  number of viral copies(<138 copies/mL). A negative result must be combined with clinical observations, patient history, and epidemiological information. The expected result is Negative.  Fact Sheet for Patients:  EntrepreneurPulse.com.au  Fact Sheet for Healthcare Providers:  IncredibleEmployment.be  This test is no t yet approved or cleared by the [place] FDA and  has been authorized for detection and/or diagnosis of SARS-CoV-2 by FDA under an Emergency Use Authorization (EUA). This EUA will remain  in effect (meaning this test can be used) for the duration of the COVID-19 declaration under Section 564(b)(1) of the Act, 21 U.S.C.section 360bbb-3(b)(1), unless the authorization is terminated  or revoked sooner.       Influenza A by PCR NEGATIVE NEGATIVE Final   Influenza B by PCR NEGATIVE NEGATIVE Final    Comment: (NOTE) The Xpert Xpress SARS-CoV-2/FLU/RSV plus assay is intended as an aid in the diagnosis of influenza from Nasopharyngeal swab specimens and should not be used as a sole basis for treatment. Nasal washings and aspirates are unacceptable for Xpert Xpress SARS-CoV-2/FLU/RSV testing.  Fact Sheet for Patients: EntrepreneurPulse.com.au  Fact Sheet for Healthcare Providers: IncredibleEmployment.be  This test is not yet approved or cleared by the [place] FDA and has been authorized for detection and/or diagnosis of SARS-CoV-2 by FDA under an Emergency Use Authorization (EUA). This EUA will remain in effect (meaning this test can be used) for the duration of the COVID-19 declaration under Section 564(b)(1) of the Act, 21 U.S.C. section 360bbb-3(b)(1), unless the authorization is terminated or revoked.  Performed at [facility], [place] [address] [place], [place] [address]   MRSA Next Gen by PCR, Nasal     Status: Abnormal   Collection Time: 05/28/21  4:55 PM   Specimen: Nasal  Mucosa; Nasal Swab  Result Value Ref Range Status   MRSA by PCR Next Gen DETECTED (A) NOT DETECTED Final    Comment: RESULT CALLED TO, READ BACK BY AND VERIFIED WITH: B Tora Kindred RN 1921 05/28/21 A BROWNING (NOTE) The GeneXpert MRSA Assay (FDA approved for NASAL specimens only), is one component of a comprehensive MRSA colonization surveillance program. It is not intended to diagnose MRSA infection nor to guide or monitor treatment for MRSA infections. Test performance is not FDA approved in patients less than 87 years old. Performed at [facility], [place] [address], [place], [place] [address]   Culture, blood (x 2)     Status: Abnormal   Collection Time: 06/01/21 11:29 AM   Specimen: BLOOD RIGHT ARM  Result Value Ref Range Status   Specimen Description BLOOD RIGHT ARM  Final   Special Requests  Final    BOTTLES DRAWN AEROBIC AND ANAEROBIC Blood Culture adequate volume   Culture  Setup Time   Final    GRAM POSITIVE COCCI IN CLUSTERS AEROBIC BOTTLE ONLY CRITICAL RESULT CALLED TO, READ BACK BY AND VERIFIED WITH: pharmd Seward Meth 06/02/2021 '@1056'$  by Blima Rich Performed at [facility], [place] [address] [place], [place] [address]    Culture METHICILLIN RESISTANT STAPHYLOCOCCUS AUREUS (A)  Final   Report Status 06/04/2021 FINAL  Final   Organism ID, Bacteria METHICILLIN RESISTANT STAPHYLOCOCCUS AUREUS  Final      Susceptibility   Methicillin resistant staphylococcus aureus - MIC*    CIPROFLOXACIN >=8 RESISTANT Resistant     ERYTHROMYCIN >=8 RESISTANT Resistant     GENTAMICIN <=0.5 SENSITIVE Sensitive     OXACILLIN >=4 RESISTANT Resistant     TETRACYCLINE <=1 SENSITIVE Sensitive     VANCOMYCIN <=0.5 SENSITIVE Sensitive     TRIMETH/SULFA <=10 SENSITIVE Sensitive     CLINDAMYCIN <=0.25 SENSITIVE Sensitive     RIFAMPIN <=0.5 SENSITIVE Sensitive     Inducible Clindamycin NEGATIVE Sensitive     * METHICILLIN RESISTANT STAPHYLOCOCCUS AUREUS  Blood Culture ID Panel (Reflexed)      Status: Abnormal   Collection Time: 06/01/21 11:29 AM  Result Value Ref Range Status   Enterococcus faecalis NOT DETECTED NOT DETECTED Final   Enterococcus Faecium NOT DETECTED NOT DETECTED Final   Listeria monocytogenes NOT DETECTED NOT DETECTED Final   Staphylococcus species DETECTED (A) NOT DETECTED Final    Comment: CRITICAL RESULT CALLED TO, READ BACK BY AND VERIFIED WITH: pharmd Seward Meth 06/02/2021 '@1056'$  by Blima Rich    Staphylococcus aureus (BCID) DETECTED (A) NOT DETECTED Final    Comment: Methicillin (oxacillin)-resistant Staphylococcus aureus (MRSA). MRSA is predictably resistant to beta-lactam antibiotics (except ceftaroline). Preferred therapy is vancomycin unless clinically contraindicated. Patient requires contact precautions if  hospitalized. CRITICAL RESULT CALLED TO, READ BACK BY AND VERIFIED WITH: pharmd Seward Meth 06/02/2021 '@1056'$  by JW    Staphylococcus epidermidis NOT DETECTED NOT DETECTED Final   Staphylococcus lugdunensis NOT DETECTED NOT DETECTED Final   Streptococcus species NOT DETECTED NOT DETECTED Final   Streptococcus agalactiae NOT DETECTED NOT DETECTED Final   Streptococcus pneumoniae NOT DETECTED NOT DETECTED Final   Streptococcus pyogenes NOT DETECTED NOT DETECTED Final   A.calcoaceticus-baumannii NOT DETECTED NOT DETECTED Final   Bacteroides fragilis NOT DETECTED NOT DETECTED Final   Enterobacterales NOT DETECTED NOT DETECTED Final   Enterobacter cloacae complex NOT DETECTED NOT DETECTED Final   Escherichia coli NOT DETECTED NOT DETECTED Final   Klebsiella aerogenes NOT DETECTED NOT DETECTED Final   Klebsiella oxytoca NOT DETECTED NOT DETECTED Final   Klebsiella pneumoniae NOT DETECTED NOT DETECTED Final   Proteus species NOT DETECTED NOT DETECTED Final   Salmonella species NOT DETECTED NOT DETECTED Final   Serratia marcescens NOT DETECTED NOT DETECTED Final   Haemophilus influenzae NOT DETECTED NOT DETECTED Final   Neisseria meningitidis NOT DETECTED NOT  DETECTED Final   Pseudomonas aeruginosa NOT DETECTED NOT DETECTED Final   Stenotrophomonas maltophilia NOT DETECTED NOT DETECTED Final   Candida albicans NOT DETECTED NOT DETECTED Final   Candida auris NOT DETECTED NOT DETECTED Final   Candida glabrata NOT DETECTED NOT DETECTED Final   Candida krusei NOT DETECTED NOT DETECTED Final   Candida parapsilosis NOT DETECTED NOT DETECTED Final   Candida tropicalis NOT DETECTED NOT DETECTED Final   Cryptococcus neoformans/gattii NOT DETECTED NOT DETECTED Final   Meth resistant mecA/C and MREJ DETECTED (A) NOT DETECTED Final  Comment: CRITICAL RESULT CALLED TO, READ BACK BY AND VERIFIED WITH: pharmd Seward Meth 06/02/2021 '@1056'$  by Blima Rich Performed at [facility], [place] [address], [place], [place] [address]   Culture, blood (x 2)     Status: None (Preliminary result)   Collection Time: 06/01/21 11:47 AM   Specimen: BLOOD  Result Value Ref Range Status   Specimen Description BLOOD BLOOD RIGHT HAND  Final   Special Requests   Final    BOTTLES DRAWN AEROBIC AND ANAEROBIC Blood Culture results may not be optimal due to an inadequate volume of blood received in culture bottles   Culture   Final    NO GROWTH 3 DAYS Performed at [facility], [place] [address] [place], [place] [address]    Report Status PENDING  Incomplete  Culture, blood (Routine X 2) w Reflex to ID Panel     Status: None (Preliminary result)   Collection Time: 06/03/21  5:03 AM   Specimen: BLOOD RIGHT ARM  Result Value Ref Range Status   Specimen Description BLOOD RIGHT ARM  Final   Special Requests   Final    BOTTLES DRAWN AEROBIC AND ANAEROBIC Blood Culture adequate volume   Culture   Final    NO GROWTH 1 DAY Performed at [facility], [place] [address] [place], [place] [address]    Report Status PENDING  Incomplete  Culture, blood (Routine X 2) w Reflex to ID Panel     Status: None (Preliminary result)   Collection Time: 06/03/21  5:15 AM   Specimen: BLOOD RIGHT  HAND  Result Value Ref Range Status   Specimen Description BLOOD RIGHT HAND  Final   Special Requests   Final    BOTTLES DRAWN AEROBIC AND ANAEROBIC Blood Culture adequate volume   Culture   Final    NO GROWTH 1 DAY Performed at [facility], [place] [address], [place], [place] [address]    Report Status PENDING  Incomplete    Procedures and diagnostic studies:  IR Removal Tun Cv Cath W/O FL  Result Date: 06/03/2021 INDICATION: Patient admitted to the hospital with MRSA bacteremia in the setting of long-term tunneled HD line. Interventional radiology asked to remove tunneled HD line for catheter holiday. EXAM: REMOVAL TUNNELED CENTRAL VENOUS CATHETER MEDICATIONS: 1% lidocaine 3 mL ANESTHESIA/SEDATION: None FLUOROSCOPY TIME:  None COMPLICATIONS: None immediate. PROCEDURE: Informed written consent was obtained from the patient after a thorough discussion of the procedural risks, benefits and alternatives. All questions were addressed. Maximal Sterile Barrier Technique was utilized including caps, mask, sterile gowns, sterile gloves, sterile drape, hand hygiene and skin antiseptic. A timeout was performed prior to the initiation of the procedure. The patient's right chest and catheter was prepped and draped in a normal sterile fashion. Heparin was removed from both ports of catheter. 1% lidocaine was used for local anesthesia. Using gentle blunt dissection the cuff of the catheter was exposed and the catheter was removed in it's entirety. Pressure was held till hemostasis was obtained. A sterile dressing was applied. The patient tolerated the procedure well with no immediate complications. IMPRESSION: Successful catheter removal as described above. Read by: Soyla Dryer, NP Electronically Signed   By: Corrie Mckusick D.O.   On: 06/03/2021 10:44               LOS: 7 days   Alysabeth Scalia  [facility]   Pager on [email or web address]. If 7PM-7AM, please contact night-coverage at  [email or web address]  06/04/2021, 11:11 AM

## 2021-06-04 NOTE — Anesthesia Procedure Notes (Signed)
Procedure Name: MAC Date/Time: 06/04/2021 12:43 PM Performed by: Rande Brunt, CRNA Pre-anesthesia Checklist: Patient identified, Emergency Drugs available, Suction available and Patient being monitored Patient Re-evaluated:Patient Re-evaluated prior to induction Oxygen Delivery Method: Nasal cannula Induction Type: IV induction Airway Equipment and Method: Bite block Placement Confirmation: positive ETCO2 and CO2 detector Dental Injury: Teeth and Oropharynx as per pre-operative assessment

## 2021-06-04 NOTE — CV Procedure (Signed)
Brief TEE Note  LVEF 60-65% Severe, concentric LVH Trivial TR No LA/LAA thrombus or masses No endocarditis  For additional details see full report.  Analiya Porco C. Oval Linsey, MD, [facility] 06/04/2021 1:09 PM

## 2021-06-04 NOTE — Progress Notes (Signed)
PT Cancellation Note  Patient Details Name: Jonathan Kane MRN: [id number] DOB: 21-Nov-1945   Cancelled Treatment:    Reason Eval/Treat Not Completed: Patient at procedure or test/unavailable Patient off unit. PT will re-attempt as time allows.   [name] A. Gilford Rile PT, DPT Acute Rehabilitation Services Pager [phone] Office [phone]   Linna Hoff 06/04/2021, 12:52 PM

## 2021-06-04 NOTE — Interval H&P Note (Signed)
History and Physical Interval Note:  06/04/2021 12:35 PM  Jonathan Kane  has presented today for surgery, with the diagnosis of BACTEREMIA.  The various methods of treatment have been discussed with the patient and family. After consideration of risks, benefits and other options for treatment, the patient has consented to  Procedure(s): TRANSESOPHAGEAL ECHOCARDIOGRAM (TEE) (N/A) as a surgical intervention.  The patient's history has been reviewed, patient examined, no change in status, stable for surgery.  I have reviewed the patient's chart and labs.  Questions were answered to the patient's satisfaction.     Skeet Latch, MD

## 2021-06-04 NOTE — Progress Notes (Signed)
  Echocardiogram Echocardiogram Transesophageal has been performed.  Jonathan Kane 06/04/2021, 1:26 PM

## 2021-06-04 NOTE — Anesthesia Postprocedure Evaluation (Signed)
Anesthesia Post Note  Patient: Jonathan Kane  Procedure(s) Performed: TRANSESOPHAGEAL ECHOCARDIOGRAM (TEE)     Patient location during evaluation: PACU Anesthesia Type: MAC Level of consciousness: awake and alert Pain management: pain level controlled Vital Signs Assessment: post-procedure vital signs reviewed and stable Respiratory status: spontaneous breathing and respiratory function stable Cardiovascular status: stable Postop Assessment: no apparent nausea or vomiting Anesthetic complications: no   No notable events documented.  Last Vitals:  Vitals:   06/04/21 1325 06/04/21 1330  BP:  133/65  Pulse: 90 92  Resp: (!) 25 17  Temp:    SpO2: 95% 96%    Last Pain:  Vitals:   06/04/21 1316  TempSrc: Tympanic  PainSc: 0-No pain                 Candra R Shalimar Mcclain

## 2021-06-04 NOTE — Progress Notes (Signed)
Patient was not available for 1200 and 1600 assessments due to being off the unit at Hemodialysis.

## 2021-06-04 NOTE — Progress Notes (Signed)
Inpatient Diabetes Program Recommendations  AACE/ADA: New Consensus Statement on Inpatient Glycemic Control (2015)  Target Ranges:  Prepandial:   less than 140 mg/dL      Peak postprandial:   less than 180 mg/dL (1-2 hours)      Critically ill patients:  140 - 180 mg/dL   Lab Results  Component Value Date   GLUCAP 113 (H) 06/04/2021   HGBA1C 11.5 (H) 06/01/2021    Review of Glycemic Control Results for Jonathan Kane, Jonathan Kane (MRN [id number]) as of 06/04/2021 10:21  Ref. Range 06/03/2021 19:37 06/04/2021 00:26 06/04/2021 01:17 06/04/2021 08:34  Glucose-Capillary Latest Ref Range: 70 - 99 mg/dL 343 (H) 43 (LL) 129 (H) 113 (H)   Diabetes history: Type 2 DM Outpatient Diabetes medications: Tresiba 32 units QD, Tradjenta 5 mg QD, Glipizide 10 mg QD, Novolog 0-7 units TID Current orders for Inpatient glycemic control: Semglee 20 units QD, Novolog 0-15 units Q4H  Inpatient Diabetes Program Recommendations:    Noted hypoglycemia yesterday of 43 mg/dL. Of note, Semglee was given 6 hours later than schedule time and larger doses of correction were given.  With renal status, would recommend changing correction to Novolog 0-6 units Q4H.   Spoke with patient regarding outpatient diabetes management. Verified home medications, however, occasionally misses Novolog with meals. Doesn't have a problem obtaining medications. Reviewed patient's current A1c of 11.5%. Explained what a A1c is and what it measures. Also reviewed goal A1c with patient, importance of good glucose control @ home, and blood sugar goals. Patient reports checking CBGs 3-4 times a day with a range of 70-290's mg/dL. Patient has needed supplies.  Denies drinking sugary beverages.  Encouraged to follow up with PCP especially if CBGs increasing with meals or exceeding 200s consistently.  Endocrinology list attached to DC summary. No further questions at this time.   Thanks, Bronson Curb, MSN, RNC-OB Diabetes Coordinator [phone]  (8a-5p)

## 2021-06-04 NOTE — Anesthesia Preprocedure Evaluation (Signed)
Anesthesia Evaluation  Patient identified by MRN, date of birth, ID band Patient awake    Reviewed: Allergy & Precautions, NPO status , Patient's Chart, lab work & pertinent test results  Airway Mallampati: II  TM Distance: >3 FB Neck ROM: Full    Dental  (+) Edentulous Upper, Edentulous Lower   Pulmonary former smoker,    Pulmonary exam normal breath sounds clear to auscultation       Cardiovascular hypertension, Pt. on medications Normal cardiovascular exam Rhythm:Regular Rate:Normal  06/01/21: TTE 05/28/20 had abnormal strain -11.6 not done on this study Clinical history suggests severe LVH due to poorly controlled HTN and not infiltrative DCM . Left ventricular ejection fraction, by estimation, is 60 to 65%. The left ventricle has normal function. The left ventricle has no regional wall motion abnormalities. The left ventricular internal cavity size was moderately dilated. There is severe left ventricular hypertrophy. Left ventricular diastolic parameters are consistent with Grade I diastolic dysfunction (impaired relaxation). 1. 2. Right ventricular systolic function is normal. The right ventricular size is normal. 3. Right atrial size was mildly dilated. The mitral valve is normal in structure. No evidence of mitral valve regurgitation. No evidence of mitral stenosis. 4. The aortic valve is tricuspid. Aortic valve regurgitation is not visualized. Mild to moderate aortic valve sclerosis/calcification is present, without any evidence of aortic stenosis. 5. The inferior vena cava is normal in size with greater than 50% respiratory variability, suggesting right atrial pressure of 3 mmHg.   Neuro/Psych Seizures -,   Neuromuscular disease (polyneuropathy) CVA    GI/Hepatic   Endo/Other  diabetes, Poorly Controlled, Type 2, Oral Hypoglycemic Agents  Renal/GU CRF, ARF and Renal InsufficiencyRenal disease      Musculoskeletal   Abdominal   Peds  Hematology  (+) anemia ,   Anesthesia Other Findings   Reproductive/Obstetrics                             Anesthesia Physical Anesthesia Plan  ASA: 2  Anesthesia Plan: MAC   Post-op Pain Management:    Induction: Intravenous  PONV Risk Score and Plan: Treatment may vary due to age or medical condition, Propofol infusion and TIVA  Airway Management Planned: Natural Airway and Simple Face Mask  Additional Equipment: None  Intra-op Plan:   Post-operative Plan: Extubation in OR  Informed Consent: I have reviewed the patients History and Physical, chart, labs and discussed the procedure including the risks, benefits and alternatives for the proposed anesthesia with the patient or authorized representative who has indicated his/her understanding and acceptance.     Dental advisory given  Plan Discussed with: CRNA, Anesthesiologist and Surgeon  Anesthesia Plan Comments:         Anesthesia Quick Evaluation

## 2021-06-04 NOTE — Transfer of Care (Signed)
Immediate Anesthesia Transfer of Care Note  Patient: Jonathan Kane  Procedure(s) Performed: TRANSESOPHAGEAL ECHOCARDIOGRAM (TEE)  Patient Location: Endoscopy Unit  Anesthesia Type:MAC  Level of Consciousness: awake, alert , drowsy and patient cooperative  Airway & Oxygen Therapy: Patient Spontanous Breathing and Patient connected to nasal cannula oxygen  Post-op Assessment: Report given to RN, Post -op Vital signs reviewed and stable and Patient moving all extremities  Post vital signs: Reviewed and stable  Last Vitals:  Vitals Value Taken Time  BP    Temp    Pulse 87   Resp 12   SpO2 96     Last Pain:  Vitals:   06/04/21 1212  TempSrc: Temporal  PainSc:       Patients Stated Pain Goal: 1 (05/39/76 7341)  Complications: No notable events documented.

## 2021-06-05 LAB — GLUCOSE, CAPILLARY
Glucose-Capillary: 214 mg/dL — ABNORMAL HIGH (ref 70–99)
Glucose-Capillary: 204 mg/dL — ABNORMAL HIGH (ref 70–99)
Glucose-Capillary: 365 mg/dL — ABNORMAL HIGH (ref 70–99)
Glucose-Capillary: 194 mg/dL — ABNORMAL HIGH (ref 70–99)
Glucose-Capillary: 223 mg/dL — ABNORMAL HIGH (ref 70–99)

## 2021-06-05 NOTE — Progress Notes (Signed)
[facility] Progress Note   Assessment/ Plan:   OP HD: [facility] M[facility]      400/ 1.5  70.5kg   2/2.5      Assessment/ Plan: Acute encephalopathy - w/ uncont diabetes (BS > 600), HTN emergency and new seizures.  Getting IV insulin and BP meds. Neuro consulted -On Keppra.  MRI 05/30/21 with punctate areas of infarct- appear embolic vs watershed per neuro note.  Repeat MRI negative 06/01/21.     Acute embolic cerebral infarctions: 30 day monitor recommended per neuro MRSA bacteremia: on vanc (end date 07/15/21) s/p RIJ TDC removal 8/18 with IR for line holiday. ID on board, TEE without endocarditis 06/04/21. ESRD - on HD MWF. Had HD Sat off schedule, Now back on MWF schedule-HD today, 06/04/21  Has AVF which was last intervened upon 06/2020--> widely patent AVF, has collaterals that may be interfering with cannulation. Will attempt to cannulate his AVF moving forward--> successful with 16 g needles.  Likely will need repeat fistulogram Acute hypoxic RF- extubated, on RA DKA - sp insulin gtt, back on SQ insulin now.  HTN: on 3 BP lowering meds with soft BP--> off ACEi, on hydralazine 25 mg TID and amlodipine 5 mg daily, cardizem added back at 180 mg daily  Dispo: pending  Subjective:    Got HD yesterday via AVF--> used 16 g needles without incident.   Objective:   BP (!) 151/93 (BP Location: Right Arm)   Pulse (!) 108   Temp 99.5 F (37.5 C) (Oral)   Resp 18   Ht '5\' 10"'$  (1.778 m)   Wt 61.5 kg   SpO2 97%   BMI 19.45 kg/m   Physical Exam: Gen: NAD, laying flat in bed CVS: RRR, II/VI systolic murmur Resp: clear anteriorly Abd: thin Ext: no LE edema ACCESS: L RC AVF pulsatile, whistling bruit near arterial anastamosis  Labs: BMET Recent Labs  Lab 05/29/21 1326 05/29/21 1710 05/30/21 0445 05/30/21 1754 05/31/21 0416 06/01/21 0316 06/01/21 1147 06/02/21 0041 06/03/21 0502  NA  --   --  134*  --  135 136 135 135 134*  K  --   --  3.8  --  3.8 4.3 4.4 4.5 3.9  CL  --    --  100  --  100 99 97* 98 97*  CO2  --   --  22  --  21* '24 23 22 23  '$ GLUCOSE  --   --  251*  --  244* 130* 157* 157* 99  BUN  --   --  43*  --  64* 28* 35* 56* 46*  CREATININE  --   --  7.47*  --  8.88* 5.47* 6.53* 7.61* 6.26*  CALCIUM  --   --  7.6*  --  7.7* 8.1* 8.0* 8.3* 9.0  PHOS 2.8 3.2 4.4 4.2 4.3 4.5  --   --   --    CBC Recent Labs  Lab 05/31/21 0416 06/01/21 0314 06/01/21 1147 06/02/21 0041 06/03/21 0502  WBC 7.9 12.9* 13.0* 13.3* 12.8*  NEUTROABS 5.6  --  11.1* 10.4* 9.7*  HGB 10.8* 11.3* 11.0* 11.9* 10.6*  HCT 32.0* 33.7* 33.2* 34.6* 31.3*  MCV 91.7 92.6 93.0 92.5 91.0  PLT 134* 145* 150 157 177      Medications:     amLODipine  5 mg Oral Daily   aspirin  81 mg Oral Daily   atorvastatin  40 mg Oral QHS   Chlorhexidine Gluconate Cloth  6  each Topical Daily   cholecalciferol  1,000 Units Oral Daily   diltiazem  180 mg Oral Daily   feeding supplement (NEPRO CARB STEADY)  237 mL Oral BID BM   heparin injection (subcutaneous)  5,000 Units Subcutaneous Q8H   hydrALAZINE  25 mg Oral Q8H   insulin aspart  0-6 Units Subcutaneous TID WC   insulin glargine-yfgn  20 Units Subcutaneous Daily   latanoprost  1 drop Both Eyes QHS   multivitamin  1 tablet Oral QHS     Madelon Lips MD [facility] 06/05/2021, 9:47 AM

## 2021-06-05 NOTE — Progress Notes (Signed)
Physical Therapy Treatment Patient Details Name: Jonathan Kane MRN: [id number] DOB: 16-Nov-1945 Today's Date: 06/05/2021    History of Present Illness 75 y.o. male presenting to ED 8/11 after being found on the floor by a neighbor. CBG >600. En route to ED patient developed R gaze preference and L-sided weakness. Code stroke called. Patient developed seizure and required intubation for airway protection. SBP 229/127 in ED. Patient admitted with acute metabolic encephalopathy with hyperglycemia, hypertensive emergency and new onset seizure with status epilepticus. 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 Extubated 8/15 for HD. Overnight EEG 8/13-8/14 with new onset status epilepticus likely secondary to severe hyperglycemia. PMHx significant for ESRD on HD MWF, DMII, BPH with obstruction, Hep C, and HTN.    PT Comments    Pt supine in bed on arrival this session.  Pt continues to benefit from skilled rehab in a post acute setting as he continues to require min assistance for gt training.  He reports he has a neighbor at home who can support him.  Will continue PT to improve strength and function.  His balance remains his largest impairment.     Follow Up Recommendations  CIR     Equipment Recommendations  Rolling walker with 5" wheels    Recommendations for Other Services       Precautions / Restrictions Precautions Precautions: Fall Precaution Comments: L inattention Restrictions Weight Bearing Restrictions: No    Mobility  Bed Mobility Overal bed mobility: Needs Assistance Bed Mobility: Supine to Sit     Supine to sit: Supervision     General bed mobility comments: Increased time and effort but no physical assistance needed.    Transfers Overall transfer level: Needs assistance Equipment used: Rolling walker (2 wheeled) Transfers: Sit to/from Stand Sit to Stand: Min guard         General transfer comment: Cues for hand placement to and from  seated surface.  Ambulation/Gait Ambulation/Gait assistance: Min assist Gait Distance (Feet): 150 Feet Assistive device: Rolling walker (2 wheeled) Gait Pattern/deviations: Shuffle;Step-through pattern;Trunk flexed Gait velocity: decreased   General Gait Details: Cues for posture and pacing.  No need for rest periods.  Mild unsteadiness noted   Stairs             Wheelchair Mobility    Modified Rankin (Stroke Patients Only) Modified Rankin (Stroke Patients Only) Pre-Morbid Rankin Score: No symptoms Modified Rankin: Moderately severe disability     Balance Overall balance assessment: Needs assistance Sitting-balance support: Feet supported Sitting balance-Leahy Scale: Fair       Standing balance-Leahy Scale: Poor                              Cognition Arousal/Alertness: Awake/alert Behavior During Therapy: WFL for tasks assessed/performed Overall Cognitive Status: Impaired/Different from baseline Area of Impairment: Following commands;Problem solving                       Following Commands: Follows one step commands with increased time     Problem Solving: Requires verbal cues;Slow processing        Exercises      General Comments        Pertinent Vitals/Pain Pain Assessment: No/denies pain    Home Living                      Prior Function  PT Goals (current goals can now be found in the care plan section) Acute Rehab PT Goals Patient Stated Goal: Move better and drive Potential to Achieve Goals: Good Progress towards PT goals: Progressing toward goals    Frequency    Min 4X/week      PT Plan Current plan remains appropriate    Co-evaluation              AM-PAC PT "6 Clicks" Mobility   Outcome Measure  Help needed turning from your back to your side while in a flat bed without using bedrails?: A Little Help needed moving from lying on your back to sitting on the side of a flat bed  without using bedrails?: A Little Help needed moving to and from a bed to a chair (including a wheelchair)?: A Little Help needed standing up from a chair using your arms (e.g., wheelchair or bedside chair)?: A Little Help needed to walk in hospital room?: A Little   6 Click Score: 15    End of Session Equipment Utilized During Treatment: Gait belt Activity Tolerance: Patient tolerated treatment well Patient left: in chair;with call bell/phone within reach;with chair alarm set Nurse Communication: Mobility status PT Visit Diagnosis: Unsteadiness on feet (R26.81);Muscle weakness (generalized) (M62.81)     Time: [id number] PT Time Calculation (min) (ACUTE ONLY): 21 min  Charges:  $Gait Training: 8-22 mins                     Erasmo Leventhal , PTA Acute Rehabilitation Services Pager [phone] Office [phone]    Kollyns Mickelson Eli Hose 06/05/2021, 1:33 PM

## 2021-06-05 NOTE — Progress Notes (Signed)
Refuse Vital signs check this AM

## 2021-06-05 NOTE — Progress Notes (Addendum)
Progress Note    Jonathan Kane  [id number] DOB: Jul 26, 1946  DOA: 05/28/2021 PCP: Cipriano Mile, NP      Brief Narrative:    Medical records reviewed and are as summarized below:  Jonathan Kane is a 75 y.o. male with medical history significant for type II DM, ESRD on hemodialysis, hypertension, substance use disorder, alcohol use disorder, hepatitis C infection, HSV infection, BPH.  He was brought to the hospital because of unresponsiveness and was found to have severe hyperglycemia and hypertensive emergency.  He developed new onset seizures with status epilepticus.  He was intubated and placed on mechanical ventilation.    Neurologist was consulted to assist with management.  He was started on Keppra.      Assessment/Plan:   Principal Problem:   MRSA bacteremia Active Problems:   DKA (diabetic ketoacidosis) ([place])   Status epilepticus ([place])   Malnutrition of moderate degree   Nutrition Problem: Moderate Malnutrition Etiology: chronic illness (ESRD on HD, DM)  Signs/Symptoms: mild fat depletion, moderate fat depletion, mild muscle depletion, moderate muscle depletion   Body mass index is 19.45 kg/m.   Acute metabolic encephalopathy, possible subacute stroke in both cerebral hemispheres: Continue aspirin and Lipitor.  Outpatient follow-up for 30-day event monitor.  Headache: Improved  Hypertension with recent hypertensive emergency: Continue antihypertensives.  2D echo showed EF estimated at 60 to 65%, severe LVH, grade 1 diastolic dysfunction  New seizures with status epilepticus: Keppra has been discontinued by the neurologist since seizure was provoked. Video EEG did not show any seizures or definitive epileptiform discharges.  There were changes suggestive of cortical dysfunction in the right hemisphere.  MRSA sepsis secondary to MRSA bacteremia:  1 out of 4 culture bottles positive for MRSA.  No growth on repeat blood culture. Dialysis permacath  was removed from the right upper chest on 06/03/2021. No evidence of atrial thrombus or valvular vegetations on TEE. Continue IV vancomycin to be given with HD through 07/15/2021 complete a 6-week course of treatment.  ESRD: Follow-up with nephrologist for hemodialysis.  IDDM with hyperglycemia and hypoglycemia: Hemoglobin A1c is 11.5. Continue insulin glargine.  Monitor glucose levels closely.    Acute hypoxic respiratory failure: S/p extubation on 05/31/2021.  He is tolerating room air.  Awaiting placement to inpatient rehab  Diet Order             Diet regular Room service appropriate? Yes; Fluid consistency: Thin  Diet effective now                      Consultants: Neurologist Nephrologist Intensivist  Procedures: Video EEG TEE on 06/04/2021    Medications:    amLODipine  5 mg Oral Daily   aspirin  81 mg Oral Daily   atorvastatin  40 mg Oral QHS   Chlorhexidine Gluconate Cloth  6 each Topical Daily   cholecalciferol  1,000 Units Oral Daily   diltiazem  180 mg Oral Daily   feeding supplement (NEPRO CARB STEADY)  237 mL Oral BID BM   heparin injection (subcutaneous)  5,000 Units Subcutaneous Q8H   hydrALAZINE  25 mg Oral Q8H   insulin aspart  0-6 Units Subcutaneous TID WC   insulin glargine-yfgn  20 Units Subcutaneous Daily   latanoprost  1 drop Both Eyes QHS   multivitamin  1 tablet Oral QHS   Continuous Infusions:  sodium chloride Stopped (05/31/21 1252)   vancomycin 750 mg (06/02/21 1303)     Anti-infectives (From  admission, onward)    Start     Dose/Rate Route Frequency Ordered Stop   06/04/21 2100  vancomycin (VANCOCIN) IVPB 750 mg/150 ml premix        750 mg 150 mL/hr over 60 Minutes Intravenous  Once 06/04/21 2007 06/04/21 2228   06/02/21 1200  vancomycin (VANCOCIN) IVPB 750 mg/150 ml premix        750 mg 150 mL/hr over 60 Minutes Intravenous Every M-W-F (Hemodialysis) 06/01/21 1144     06/01/21 1230  piperacillin-tazobactam (ZOSYN) IVPB 2.25  g  Status:  Discontinued        2.25 g 100 mL/hr over 30 Minutes Intravenous Every 8 hours 06/01/21 1134 06/02/21 1250   06/01/21 1200  metroNIDAZOLE (FLAGYL) IVPB 500 mg  Status:  Discontinued        500 mg 100 mL/hr over 60 Minutes Intravenous Every 8 hours 06/01/21 1103 06/01/21 1125   06/01/21 1200  vancomycin (VANCOREADY) IVPB 1500 mg/300 mL        1,500 mg 150 mL/hr over 120 Minutes Intravenous  Once 06/01/21 1103 06/01/21 1549   06/01/21 1200  piperacillin-tazobactam (ZOSYN) IVPB 2.25 g  Status:  Discontinued        2.25 g 100 mL/hr over 30 Minutes Intravenous Every 8 hours 06/01/21 1125 06/01/21 1134              Family Communication/Anticipated D/C date and plan/Code Status   DVT prophylaxis: heparin injection 5,000 Units Start: 05/28/21 2200     Code Status: Full Code  Family Communication: None Disposition Plan:    Status is: Inpatient  Remains inpatient appropriate because:IV treatments appropriate due to intensity of illness or inability to take PO and Inpatient level of care appropriate due to severity of illness  Dispo: The patient is from: Home              Anticipated d/c is to: [facility]              Patient currently is not medically stable to d/c.   Difficult to place patient No           Subjective:   No complaints. Interval events noted. He walked with PT today  Objective:    Vitals:   06/04/21 1649 06/04/21 2024 06/05/21 1000 06/05/21 1212  BP: (!) 161/68 (!) 151/93 (!) 164/88 (!) 141/78  Pulse: 98 (!) 108 (!) 104 (!) 106  Resp: '16 18 18 18  '$ Temp:  99.5 F (37.5 C) 98.9 F (37.2 C) 99.1 F (37.3 C)  TempSrc:  Oral Oral Oral  SpO2: 97% 97% 99% 98%  Weight:      Height:       No data found.   Intake/Output Summary (Last 24 hours) at 06/05/2021 1549 Last data filed at 06/04/2021 2359 Gross per 24 hour  Intake 384.87 ml  Output --  Net 384.87 ml   Filed Weights   06/02/21 1030 06/02/21 1353 06/04/21 1212  Weight: 62.1 kg  61.5 kg 61.5 kg    Exam:  GEN: NAD SKIN: Warm and dry EYES: No pallor or icterus ENT: MMM CV: RRR PULM: CTA B ABD: soft, ND, NT, +BS CNS: AAO x 3, non focal EXT: No edema or tenderness.  Left forearm fistula with palpable thrill and audible bruit.              Data Reviewed:   I have personally reviewed following labs and imaging studies:  Labs: Labs show the following:   Basic Metabolic  Panel: Recent Labs  Lab 05/29/21 1710 05/30/21 0445 05/30/21 0445 05/30/21 1754 05/31/21 0416 06/01/21 0316 06/01/21 1147 06/02/21 0041 06/03/21 0502  NA  --  134*   < >  --  135 136 135 135 134*  K  --  3.8   < >  --  3.8 4.3 4.4 4.5 3.9  CL  --  100   < >  --  100 99 97* 98 97*  CO2  --  22   < >  --  21* '24 23 22 23  '$ GLUCOSE  --  251*   < >  --  244* 130* 157* 157* 99  BUN  --  43*   < >  --  64* 28* 35* 56* 46*  CREATININE  --  7.47*   < >  --  8.88* 5.47* 6.53* 7.61* 6.26*  CALCIUM  --  7.6*   < >  --  7.7* 8.1* 8.0* 8.3* 9.0  MG 2.0 2.2  --  2.3  --   --   --  2.2  --   PHOS 3.2 4.4  --  4.2 4.3 4.5  --   --   --    < > = values in this interval not displayed.   GFR Estimated Creatinine Clearance: 9.1 mL/min (A) (by C-G formula based on SCr of 6.26 mg/dL (H)). Liver Function Tests: Recent Labs  Lab 05/30/21 0445 05/31/21 0416 06/01/21 0316 06/01/21 1147 06/02/21 0041  AST 19  --   --  88* 74*  ALT 15  --   --  52* 63*  ALKPHOS 47  --   --  70 90  BILITOT 0.5  --   --  0.9 0.8  PROT 6.0*  --   --  6.5 6.7  ALBUMIN 2.9* 2.7* 2.8* 2.7* 2.6*   No results for input(s): LIPASE, AMYLASE in the last 168 hours. No results for input(s): AMMONIA in the last 168 hours. Coagulation profile Recent Labs  Lab 06/01/21 1147  INR 1.3*    CBC: Recent Labs  Lab 05/31/21 0416 06/01/21 0314 06/01/21 1147 06/02/21 0041 06/03/21 0502  WBC 7.9 12.9* 13.0* 13.3* 12.8*  NEUTROABS 5.6  --  11.1* 10.4* 9.7*  HGB 10.8* 11.3* 11.0* 11.9* 10.6*  HCT 32.0* 33.7*  33.2* 34.6* 31.3*  MCV 91.7 92.6 93.0 92.5 91.0  PLT 134* 145* 150 157 177   Cardiac Enzymes: No results for input(s): CKTOTAL, CKMB, CKMBINDEX, TROPONINI in the last 168 hours. BNP (last 3 results) No results for input(s): PROBNP in the last 8760 hours. CBG: Recent Labs  Lab 06/04/21 1425 06/04/21 2147 06/05/21 0627 06/05/21 1034 06/05/21 1214  GLUCAP 85 295* 214* 204* 365*   D-Dimer: No results for input(s): DDIMER in the last 72 hours. Hgb A1c: No results for input(s): HGBA1C in the last 72 hours.  Lipid Profile: No results for input(s): CHOL, HDL, LDLCALC, TRIG, CHOLHDL, LDLDIRECT in the last 72 hours.  Thyroid function studies: No results for input(s): TSH, T4TOTAL, T3FREE, THYROIDAB in the last 72 hours.  Invalid input(s): FREET3 Anemia work up: No results for input(s): VITAMINB12, FOLATE, FERRITIN, TIBC, IRON, RETICCTPCT in the last 72 hours. Sepsis Labs: Recent Labs  Lab 06/01/21 0314 06/01/21 1129 06/01/21 1147 06/01/21 1402 06/02/21 0041 06/03/21 0502  PROCALCITON  --   --   --   --  5.92 6.13  WBC 12.9*  --  13.0*  --  13.3* 12.8*  LATICACIDVEN  --  1.0  --  1.0  --   --     Microbiology Recent Results (from the past 240 hour(s))  Resp Panel by RT-PCR (Flu A&B, Covid) Nasopharyngeal Swab     Status: None   Collection Time: 05/28/21  1:09 PM   Specimen: Nasopharyngeal Swab; Nasopharyngeal(NP) swabs in vial transport medium  Result Value Ref Range Status   SARS Coronavirus 2 by RT PCR NEGATIVE NEGATIVE Final    Comment: (NOTE) SARS-CoV-2 target nucleic acids are NOT DETECTED.  The SARS-CoV-2 RNA is generally detectable in upper respiratory specimens during the acute phase of infection. The lowest concentration of SARS-CoV-2 viral copies this assay can detect is 138 copies/mL. A negative result does not preclude SARS-Cov-2 infection and should not be used as the sole basis for treatment or other patient management decisions. A negative result may  occur with  improper specimen collection/handling, submission of specimen other than nasopharyngeal swab, presence of viral mutation(s) within the areas targeted by this assay, and inadequate number of viral copies(<138 copies/mL). A negative result must be combined with clinical observations, patient history, and epidemiological information. The expected result is Negative.  Fact Sheet for Patients:  EntrepreneurPulse.com.au  Fact Sheet for Healthcare Providers:  IncredibleEmployment.be  This test is no t yet approved or cleared by the [place] FDA and  has been authorized for detection and/or diagnosis of SARS-CoV-2 by FDA under an Emergency Use Authorization (EUA). This EUA will remain  in effect (meaning this test can be used) for the duration of the COVID-19 declaration under Section 564(b)(1) of the Act, 21 U.S.C.section 360bbb-3(b)(1), unless the authorization is terminated  or revoked sooner.       Influenza A by PCR NEGATIVE NEGATIVE Final   Influenza B by PCR NEGATIVE NEGATIVE Final    Comment: (NOTE) The Xpert Xpress SARS-CoV-2/FLU/RSV plus assay is intended as an aid in the diagnosis of influenza from Nasopharyngeal swab specimens and should not be used as a sole basis for treatment. Nasal washings and aspirates are unacceptable for Xpert Xpress SARS-CoV-2/FLU/RSV testing.  Fact Sheet for Patients: EntrepreneurPulse.com.au  Fact Sheet for Healthcare Providers: IncredibleEmployment.be  This test is not yet approved or cleared by the [place] FDA and has been authorized for detection and/or diagnosis of SARS-CoV-2 by FDA under an Emergency Use Authorization (EUA). This EUA will remain in effect (meaning this test can be used) for the duration of the COVID-19 declaration under Section 564(b)(1) of the Act, 21 U.S.C. section 360bbb-3(b)(1), unless the authorization is terminated  or revoked.  Performed at [facility], [place] [address] [place], [place] [address]   MRSA Next Gen by PCR, Nasal     Status: Abnormal   Collection Time: 05/28/21  4:55 PM   Specimen: Nasal Mucosa; Nasal Swab  Result Value Ref Range Status   MRSA by PCR Next Gen DETECTED (A) NOT DETECTED Final    Comment: RESULT CALLED TO, READ BACK BY AND VERIFIED WITH: B Tora Kindred RN 1921 05/28/21 A BROWNING (NOTE) The GeneXpert MRSA Assay (FDA approved for NASAL specimens only), is one component of a comprehensive MRSA colonization surveillance program. It is not intended to diagnose MRSA infection nor to guide or monitor treatment for MRSA infections. Test performance is not FDA approved in patients less than 61 years old. Performed at [facility], [place] [address] [place], [place] [address]   Culture, blood (x 2)     Status: Abnormal   Collection Time: 06/01/21 11:29 AM   Specimen: BLOOD RIGHT  ARM  Result Value Ref Range Status   Specimen Description BLOOD RIGHT ARM  Final   Special Requests   Final    BOTTLES DRAWN AEROBIC AND ANAEROBIC Blood Culture adequate volume   Culture  Setup Time   Final    GRAM POSITIVE COCCI IN CLUSTERS AEROBIC BOTTLE ONLY CRITICAL RESULT CALLED TO, READ BACK BY AND VERIFIED WITH: pharmd Seward Meth 06/02/2021 '@1056'$  by Blima Rich Performed at [facility], [place] [address] [place], [place] [address]    Culture METHICILLIN RESISTANT STAPHYLOCOCCUS AUREUS (A)  Final   Report Status 06/04/2021 FINAL  Final   Organism ID, Bacteria METHICILLIN RESISTANT STAPHYLOCOCCUS AUREUS  Final      Susceptibility   Methicillin resistant staphylococcus aureus - MIC*    CIPROFLOXACIN >=8 RESISTANT Resistant     ERYTHROMYCIN >=8 RESISTANT Resistant     GENTAMICIN <=0.5 SENSITIVE Sensitive     OXACILLIN >=4 RESISTANT Resistant     TETRACYCLINE <=1 SENSITIVE Sensitive     VANCOMYCIN <=0.5 SENSITIVE Sensitive     TRIMETH/SULFA <=10 SENSITIVE Sensitive     CLINDAMYCIN  <=0.25 SENSITIVE Sensitive     RIFAMPIN <=0.5 SENSITIVE Sensitive     Inducible Clindamycin NEGATIVE Sensitive     * METHICILLIN RESISTANT STAPHYLOCOCCUS AUREUS  Blood Culture ID Panel (Reflexed)     Status: Abnormal   Collection Time: 06/01/21 11:29 AM  Result Value Ref Range Status   Enterococcus faecalis NOT DETECTED NOT DETECTED Final   Enterococcus Faecium NOT DETECTED NOT DETECTED Final   Listeria monocytogenes NOT DETECTED NOT DETECTED Final   Staphylococcus species DETECTED (A) NOT DETECTED Final    Comment: CRITICAL RESULT CALLED TO, READ BACK BY AND VERIFIED WITH: pharmd Seward Meth 06/02/2021 '@1056'$  by Blima Rich    Staphylococcus aureus (BCID) DETECTED (A) NOT DETECTED Final    Comment: Methicillin (oxacillin)-resistant Staphylococcus aureus (MRSA). MRSA is predictably resistant to beta-lactam antibiotics (except ceftaroline). Preferred therapy is vancomycin unless clinically contraindicated. Patient requires contact precautions if  hospitalized. CRITICAL RESULT CALLED TO, READ BACK BY AND VERIFIED WITH: pharmd Seward Meth 06/02/2021 '@1056'$  by Blima Rich    Staphylococcus epidermidis NOT DETECTED NOT DETECTED Final   Staphylococcus lugdunensis NOT DETECTED NOT DETECTED Final   Streptococcus species NOT DETECTED NOT DETECTED Final   Streptococcus agalactiae NOT DETECTED NOT DETECTED Final   Streptococcus pneumoniae NOT DETECTED NOT DETECTED Final   Streptococcus pyogenes NOT DETECTED NOT DETECTED Final   A.calcoaceticus-baumannii NOT DETECTED NOT DETECTED Final   Bacteroides fragilis NOT DETECTED NOT DETECTED Final   Enterobacterales NOT DETECTED NOT DETECTED Final   Enterobacter cloacae complex NOT DETECTED NOT DETECTED Final   Escherichia coli NOT DETECTED NOT DETECTED Final   Klebsiella aerogenes NOT DETECTED NOT DETECTED Final   Klebsiella oxytoca NOT DETECTED NOT DETECTED Final   Klebsiella pneumoniae NOT DETECTED NOT DETECTED Final   Proteus species NOT DETECTED NOT DETECTED Final    Salmonella species NOT DETECTED NOT DETECTED Final   Serratia marcescens NOT DETECTED NOT DETECTED Final   Haemophilus influenzae NOT DETECTED NOT DETECTED Final   Neisseria meningitidis NOT DETECTED NOT DETECTED Final   Pseudomonas aeruginosa NOT DETECTED NOT DETECTED Final   Stenotrophomonas maltophilia NOT DETECTED NOT DETECTED Final   Candida albicans NOT DETECTED NOT DETECTED Final   Candida auris NOT DETECTED NOT DETECTED Final   Candida glabrata NOT DETECTED NOT DETECTED Final   Candida krusei NOT DETECTED NOT DETECTED Final   Candida parapsilosis NOT DETECTED NOT DETECTED Final   Candida tropicalis NOT DETECTED NOT DETECTED  Final   Cryptococcus neoformans/gattii NOT DETECTED NOT DETECTED Final   Meth resistant mecA/C and MREJ DETECTED (A) NOT DETECTED Final    Comment: CRITICAL RESULT CALLED TO, READ BACK BY AND VERIFIED WITH: pharmd Seward Meth 06/02/2021 '@1056'$  by Blima Rich Performed at [facility], [place] [address] [place], [place] [address]   Culture, blood (x 2)     Status: None (Preliminary result)   Collection Time: 06/01/21 11:47 AM   Specimen: BLOOD  Result Value Ref Range Status   Specimen Description BLOOD BLOOD RIGHT HAND  Final   Special Requests   Final    BOTTLES DRAWN AEROBIC AND ANAEROBIC Blood Culture results may not be optimal due to an inadequate volume of blood received in culture bottles   Culture   Final    NO GROWTH 4 DAYS Performed at [facility], [place] [address], [place], [place] [address]    Report Status PENDING  Incomplete  Culture, blood (Routine X 2) w Reflex to ID Panel     Status: None (Preliminary result)   Collection Time: 06/03/21  5:03 AM   Specimen: BLOOD RIGHT ARM  Result Value Ref Range Status   Specimen Description BLOOD RIGHT ARM  Final   Special Requests   Final    BOTTLES DRAWN AEROBIC AND ANAEROBIC Blood Culture adequate volume   Culture   Final    NO GROWTH 2 DAYS Performed at [facility], [place] [address],  [place], [place] [address]    Report Status PENDING  Incomplete  Culture, blood (Routine X 2) w Reflex to ID Panel     Status: None (Preliminary result)   Collection Time: 06/03/21  5:15 AM   Specimen: BLOOD RIGHT HAND  Result Value Ref Range Status   Specimen Description BLOOD RIGHT HAND  Final   Special Requests   Final    BOTTLES DRAWN AEROBIC AND ANAEROBIC Blood Culture adequate volume   Culture   Final    NO GROWTH 2 DAYS Performed at [facility], [place] [address] [place], [place] [address]    Report Status PENDING  Incomplete    Procedures and diagnostic studies:  ECHO TEE  Result Date: Jun 05, 2021    TRANSESOPHOGEAL ECHO REPORT   Patient Name:   Jonathan Kane Date of Exam: 06-05-2021 Medical Rec #:  [id number]        Height:       70.0 in Accession #:    [id number]       Weight:       135.6 lb Date of Birth:  1946/02/10         BSA:          1.769 m Patient Age:    10 years         BP:           110/54 mmHg Patient Gender: M                HR:           89 bpm. Exam Location:  Inpatient Procedure: Cardiac Doppler, Color Doppler and Transesophageal Echo Indications:     Bacteremia  History:         Patient has prior history of Echocardiogram examinations, most                  recent 06/01/2021. Risk Factors:Former Smoker, Diabetes and  Hypertension. CKD.  Sonographer:     Clayton Lefort RDCS (AE) Referring Phys:  [id number] HAO MENG Diagnosing Phys: Skeet Latch MD PROCEDURE: After discussion of the risks and benefits of a TEE, an informed consent was obtained from the patient. The transesophogeal probe was passed without difficulty through the esophogus of the patient. Local oropharyngeal anesthetic was provided with Cetacaine. Sedation performed by different physician. The patient was monitored while under deep sedation. Anesthestetic sedation was provided intravenously by Anesthesiology: 241.'65mg'$  of Propofol, '60mg'$  of Lidocaine. Image quality was adequate. The patient's vital  signs; including heart rate, blood pressure, and oxygen saturation; remained stable throughout the procedure. The patient developed no complications during the procedure. IMPRESSIONS  1. Left ventricular ejection fraction, by estimation, is 60 to 65%. The left ventricle has normal function. The left ventricle has no regional wall motion abnormalities. There is severe concentric left ventricular hypertrophy.  2. Right ventricular systolic function is normal. The right ventricular size is normal.  3. No left atrial/left atrial appendage thrombus was detected.  4. The mitral valve is normal in structure. Trivial mitral valve regurgitation. No evidence of mitral stenosis.  5. The aortic valve is tricuspid. Aortic valve regurgitation is not visualized. No aortic stenosis is present.  6. The inferior vena cava is normal in size with greater than 50% respiratory variability, suggesting right atrial pressure of 3 mmHg. Conclusion(s)/Recommendation(s): Normal biventricular function without evidence of hemodynamically significant valvular heart disease. FINDINGS  Left Ventricle: Left ventricular ejection fraction, by estimation, is 60 to 65%. The left ventricle has normal function. The left ventricle has no regional wall motion abnormalities. The left ventricular internal cavity size was normal in size. There is  severe concentric left ventricular hypertrophy. Right Ventricle: The right ventricular size is normal. No increase in right ventricular wall thickness. Right ventricular systolic function is normal. Left Atrium: Left atrial size was normal in size. No left atrial/left atrial appendage thrombus was detected. Right Atrium: Right atrial size was normal in size. Pericardium: There is no evidence of pericardial effusion. Mitral Valve: The mitral valve is normal in structure. Trivial mitral valve regurgitation. No evidence of mitral valve stenosis. MV peak gradient, 3.1 mmHg. The mean mitral valve gradient is 2.0 mmHg.  Tricuspid Valve: The tricuspid valve is normal in structure. Tricuspid valve regurgitation is trivial. No evidence of tricuspid stenosis. Aortic Valve: The aortic valve is tricuspid. Aortic valve regurgitation is not visualized. No aortic stenosis is present. Aortic valve mean gradient measures 4.0 mmHg. Aortic valve peak gradient measures 7.3 mmHg. Pulmonic Valve: The pulmonic valve was normal in structure. Pulmonic valve regurgitation is trivial. No evidence of pulmonic stenosis. Aorta: The aortic root is normal in size and structure. Venous: The inferior vena cava is normal in size with greater than 50% respiratory variability, suggesting right atrial pressure of 3 mmHg. IAS/Shunts: No atrial level shunt detected by color flow Doppler.  AORTIC VALVE AV Vmax:      135.00 cm/s AV Vmean:     87.800 cm/s AV VTI:       0.184 m AV Peak Grad: 7.3 mmHg AV Mean Grad: 4.0 mmHg MITRAL VALVE            TRICUSPID VALVE MV Peak grad: 3.1 mmHg  TR Peak grad:   10.7 mmHg MV Mean grad: 2.0 mmHg  TR Vmax:        163.87 cm/s MV Vmax:      0.89 m/s MV Vmean:     61.5 cm/s Skeet Latch MD  Electronically signed by Skeet Latch MD Signature Date/Time: 06/04/2021/3:49:26 PM    Final                LOS: 8 days   Jonathan Kane  [facility]   Pager on [email or web address]. If 7PM-7AM, please contact night-coverage at [email or web address]     06/05/2021, 3:49 PM

## 2021-06-05 NOTE — Progress Notes (Signed)
Pharmacy Antibiotic Note  Jonathan Kane is a 75 y.o. male admitted on 05/28/2021 with HHS/HTN emergency, found unresponsive with new seizures. PTA the patient is on HD MWF for ESRD and typically tolerates full dialysis sessions. The patient is s/p RIJ TDC removal on 8/18 with IR for a line holiday. TEE on 8/19 was negative for endocarditis. Per nephrology, will try to cannulate his AVF, which was done on 8/19 but will likely need repeat fistulogram. Pharmacy has been consulted for vancomycin dosing.  Plan: -Continue vancomycin IV 750 mg every MWF (HD) -Plan therapy through 07/15/21 -Follow HD schedule  Height: '5\' 10"'$  (177.8 cm) Weight: 61.5 kg (135 lb 9.3 oz) IBW/kg (Calculated) : 73  Temp (24hrs), Avg:98.5 F (36.9 C), Min:97.9 F (36.6 C), Max:99.5 F (37.5 C)  Recent Labs  Lab 05/31/21 0416 06/01/21 0314 06/01/21 0316 06/01/21 1129 06/01/21 1147 06/01/21 1402 06/02/21 0041 06/03/21 0502  WBC 7.9 12.9*  --   --  13.0*  --  13.3* 12.8*  CREATININE 8.88*  --  5.47*  --  6.53*  --  7.61* 6.26*  LATICACIDVEN  --   --   --  1.0  --  1.0  --   --     Estimated Creatinine Clearance: 9.1 mL/min (A) (by C-G formula based on SCr of 6.26 mg/dL (H)).    Allergies  Allergen Reactions   Dulaglutide     TRULICITY:  GI Upset intolerance    Antimicrobials this admission: 8/16 piperacillin/tazobactam > 8/17 8/16 vancomycin >> (9/29)  Microbiology results: 8/12 COVID/Flu: negative 8/12 MRSA: positive 8/16 BCx: MRSA in 1/4 bottles 8/18 BCx: negative   Thank you for allowing pharmacy to be a part of this patient's care.  Shauna Hugh, PharmD, [place]  PGY-2 Pharmacy Resident 06/05/2021 10:57 AM  Please check [email or web address] for unit-specific pharmacy phone numbers.

## 2021-06-05 NOTE — Plan of Care (Signed)

## 2021-06-06 LAB — CULTURE, BLOOD (ROUTINE X 2): Culture: NO GROWTH

## 2021-06-06 LAB — GLUCOSE, CAPILLARY
Glucose-Capillary: 194 mg/dL — ABNORMAL HIGH (ref 70–99)
Glucose-Capillary: 331 mg/dL — ABNORMAL HIGH (ref 70–99)
Glucose-Capillary: 240 mg/dL — ABNORMAL HIGH (ref 70–99)
Glucose-Capillary: 273 mg/dL — ABNORMAL HIGH (ref 70–99)

## 2021-06-06 MED ORDER — INSULIN GLARGINE-YFGN 100 UNIT/ML ~~LOC~~ SOLN
22.0000 [IU] | Freq: Every day | SUBCUTANEOUS | Status: DC
  Administered 2021-06-07 – 2021-06-08 (×2): 22 [IU] via SUBCUTANEOUS
  Filled 2021-06-06 (×3): qty 0.22

## 2021-06-06 NOTE — Plan of Care (Signed)

## 2021-06-06 NOTE — Progress Notes (Signed)
[facility] Progress Note   Assessment/ Plan:   OP HD: [facility] M[facility]      400/ 1.5  70.5kg   2/2.5      Assessment/ Plan: Acute encephalopathy - w/ uncont diabetes (BS > 600), HTN emergency and new seizures.  Getting IV insulin and BP meds. Neuro consulted -On Keppra.  MRI 05/30/21 with punctate areas of infarct- appear embolic vs watershed per neuro note.  Repeat MRI negative 06/01/21.     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 ID on board, TEE without endocarditis 06/04/21. ESRD - on HD MWF. Last HD, 06/04/21  Has AVF which was last intervened upon 06/2020--> widely patent AVF, has collaterals that may be interfering with cannulation. Will attempt to cannulate his AVF moving forward--> successful with 16 g needles.  Likely will need repeat fistulogram, can be accomplished as OP.  Next HD 06/07/21/ Acute hypoxic RF- extubated, on RA DKA - sp insulin gtt, back on SQ insulin now.  HTN: on 3 BP lowering meds with soft BP--> off ACEi, on hydralazine 25 mg TID and amlodipine 5 mg daily, cardizem added back at 180 mg daily  Dispo: pending  Subjective:    No issues today.  Feeling OK   Objective:   BP (!) 158/77 (BP Location: Right Arm)   Pulse 100   Temp 99.1 F (37.3 C) (Oral)   Resp 20   Ht '5\' 10"'$  (1.778 m)   Wt 61.5 kg   SpO2 97%   BMI 19.45 kg/m   Physical Exam: Gen: NAD, laying flat in bed CVS: RRR, II/VI systolic murmur Resp: clear anteriorly Abd: thin Ext: no LE edema ACCESS: L RC AVF pulsatile, whistling bruit near arterial anastamosis  Labs: BMET Recent Labs  Lab 05/30/21 1754 05/31/21 0416 06/01/21 0316 06/01/21 1147 06/02/21 0041 06/03/21 0502  NA  --  135 136 135 135 134*  K  --  3.8 4.3 4.4 4.5 3.9  CL  --  100 99 97* 98 97*  CO2  --  21* '24 23 22 23  '$ GLUCOSE  --  244* 130* 157* 157* 99  BUN  --  64* 28* 35* 56* 46*  CREATININE  --   8.88* 5.47* 6.53* 7.61* 6.26*  CALCIUM  --  7.7* 8.1* 8.0* 8.3* 9.0  PHOS 4.2 4.3 4.5  --   --   --    CBC Recent Labs  Lab 05/31/21 0416 06/01/21 0314 06/01/21 1147 06/02/21 0041 06/03/21 0502  WBC 7.9 12.9* 13.0* 13.3* 12.8*  NEUTROABS 5.6  --  11.1* 10.4* 9.7*  HGB 10.8* 11.3* 11.0* 11.9* 10.6*  HCT 32.0* 33.7* 33.2* 34.6* 31.3*  MCV 91.7 92.6 93.0 92.5 91.0  PLT 134* 145* 150 157 177      Medications:     amLODipine  5 mg Oral Daily   aspirin  81 mg Oral Daily   atorvastatin  40 mg Oral QHS   cholecalciferol  1,000 Units Oral Daily   diltiazem  180 mg Oral Daily   feeding supplement (NEPRO CARB STEADY)  237 mL Oral BID BM   heparin injection (subcutaneous)  5,000 Units Subcutaneous Q8H   hydrALAZINE  25 mg Oral Q8H   insulin aspart  0-6 Units Subcutaneous TID WC   insulin glargine-yfgn  20 Units Subcutaneous Daily   latanoprost  1 drop Both Eyes QHS   multivitamin  1 tablet  Oral QHS     Madelon Lips MD [facility] 06/06/2021, 10:35 AM

## 2021-06-06 NOTE — Progress Notes (Signed)
Progress Note    YITZCHAK ALDAZ  [id number] DOB: Sep 27, 1946  DOA: 05/28/2021 PCP: Cipriano Mile, NP      Brief Narrative:    Medical records reviewed and are as summarized below:  JASKIRAT DEWAR is a 75 y.o. male with medical history significant for type II DM, ESRD on hemodialysis, hypertension, substance use disorder, alcohol use disorder, hepatitis C infection, HSV infection, BPH.  He was brought to the hospital because of unresponsiveness and was found to have severe hyperglycemia and hypertensive emergency.  He developed new onset seizures with status epilepticus.  He was intubated and placed on mechanical ventilation.    Neurologist was consulted to assist with management.  He was started on Keppra.      Assessment/Plan:   Principal Problem:   MRSA bacteremia Active Problems:   DKA (diabetic ketoacidosis) ([place])   Status epilepticus ([place])   Malnutrition of moderate degree   Nutrition Problem: Moderate Malnutrition Etiology: chronic illness (ESRD on HD, DM)  Signs/Symptoms: mild fat depletion, moderate fat depletion, mild muscle depletion, moderate muscle depletion   Body mass index is 19.45 kg/m.   Acute metabolic encephalopathy, possible subacute stroke in both cerebral hemispheres: Continue aspirin and Lipitor.  Outpatient follow-up for 30-day event monitor.  Headache: Improved  Hypertension with recent hypertensive emergency: Continue antihypertensives.  2D echo showed EF estimated at 60 to 65%, severe LVH, grade 1 diastolic dysfunction  New seizures with status epilepticus: Keppra has been discontinued by the neurologist since seizure was provoked. Video EEG did not show any seizures or definitive epileptiform discharges.  There were changes suggestive of cortical dysfunction in the right hemisphere.  MRSA sepsis secondary to MRSA bacteremia:  1 out of 4 culture bottles positive for MRSA.  No growth on repeat blood culture. Dialysis permacath  was removed from the right upper chest on 06/03/2021. No evidence of atrial thrombus or valvular vegetations on TEE. Continue IV vancomycin to be given with hemodialysis through 07/16/2019 to complete a 6-week course of treatment.  ESRD: Follow-up with nephrologist for hemodialysis.  IDDM with hyperglycemia and hypoglycemia: Hemoglobin A1c is 11.5.  Increase insulin glargine to 22 units daily.  Continue NovoLog as needed for hyperglycemia.  Monitor glucose levels closely.    Acute hypoxic respiratory failure: S/p extubation on 05/31/2021.  He is tolerating room air.  Awaiting placement to inpatient rehab  Diet Order             Diet renal/carb modified with fluid restriction Diet-HS Snack? Nothing; Fluid restriction: 1200 mL Fluid; Room service appropriate? Yes; Fluid consistency: Thin  Diet effective now                      Consultants: Neurologist Nephrologist Intensivist  Procedures: Video EEG TEE on 06/04/2021    Medications:    amLODipine  5 mg Oral Daily   aspirin  81 mg Oral Daily   atorvastatin  40 mg Oral QHS   cholecalciferol  1,000 Units Oral Daily   diltiazem  180 mg Oral Daily   feeding supplement (NEPRO CARB STEADY)  237 mL Oral BID BM   heparin injection (subcutaneous)  5,000 Units Subcutaneous Q8H   hydrALAZINE  25 mg Oral Q8H   insulin aspart  0-6 Units Subcutaneous TID WC   insulin glargine-yfgn  20 Units Subcutaneous Daily   latanoprost  1 drop Both Eyes QHS   multivitamin  1 tablet Oral QHS   Continuous Infusions:  sodium chloride Stopped (  05/31/21 1252)   vancomycin 750 mg (06/02/21 1303)     Anti-infectives (From admission, onward)    Start     Dose/Rate Route Frequency Ordered Stop   06/04/21 2100  vancomycin (VANCOCIN) IVPB 750 mg/150 ml premix        750 mg 150 mL/hr over 60 Minutes Intravenous  Once 06/04/21 2007 06/04/21 2228   06/02/21 1200  vancomycin (VANCOCIN) IVPB 750 mg/150 ml premix        750 mg 150 mL/hr over 60  Minutes Intravenous Every M-W-F (Hemodialysis) 06/01/21 1144     06/01/21 1230  piperacillin-tazobactam (ZOSYN) IVPB 2.25 g  Status:  Discontinued        2.25 g 100 mL/hr over 30 Minutes Intravenous Every 8 hours 06/01/21 1134 06/02/21 1250   06/01/21 1200  metroNIDAZOLE (FLAGYL) IVPB 500 mg  Status:  Discontinued        500 mg 100 mL/hr over 60 Minutes Intravenous Every 8 hours 06/01/21 1103 06/01/21 1125   06/01/21 1200  vancomycin (VANCOREADY) IVPB 1500 mg/300 mL        1,500 mg 150 mL/hr over 120 Minutes Intravenous  Once 06/01/21 1103 06/01/21 1549   06/01/21 1200  piperacillin-tazobactam (ZOSYN) IVPB 2.25 g  Status:  Discontinued        2.25 g 100 mL/hr over 30 Minutes Intravenous Every 8 hours 06/01/21 1125 06/01/21 1134              Family Communication/Anticipated D/C date and plan/Code Status   DVT prophylaxis: heparin injection 5,000 Units Start: 05/28/21 2200     Code Status: Full Code  Family Communication: None Disposition Plan:    Status is: Inpatient  Remains inpatient appropriate because:IV treatments appropriate due to intensity of illness or inability to take PO and Inpatient level of care appropriate due to severity of illness  Dispo: The patient is from: Home              Anticipated d/c is to: [facility]              Patient currently is medically stable to d/c.   Difficult to place patient No           Subjective:   Interval events noted.  No complaints.  He feels better today.  Objective:    Vitals:   06/06/21 0348 06/06/21 0701 06/06/21 0850 06/06/21 1134  BP: (!) 112/53 (!) 148/72 (!) 158/77 (!) 163/76  Pulse: 100 97 100 (!) 102  Resp: '17 18 20 18  '$ Temp: 99.4 F (37.4 C) 98.4 F (36.9 C) 99.1 F (37.3 C) 98.7 F (37.1 C)  TempSrc: Oral Oral Oral Oral  SpO2: 95% 98% 97% 98%  Weight:      Height:       No data found.   Intake/Output Summary (Last 24 hours) at 06/06/2021 1139 Last data filed at 06/06/2021 0647 Gross per 24  hour  Intake 450 ml  Output 100 ml  Net 350 ml   Filed Weights   06/02/21 1030 06/02/21 1353 06/04/21 1212  Weight: 62.1 kg 61.5 kg 61.5 kg    Exam:  GEN: NAD SKIN: Warm and dry EYES: No pallor or icterus ENT: MMM CV: RRR PULM: CTA B ABD: soft, ND, NT, +BS CNS: AAO x 3, non focal EXT: No edema or tenderness.  Left forearm fistula with palpable thrill and audible bruit                Data Reviewed:  I have personally reviewed following labs and imaging studies:  Labs: Labs show the following:   Basic Metabolic Panel: Recent Labs  Lab 05/30/21 1754 05/31/21 0416 05/31/21 0416 06/01/21 0316 06/01/21 1147 06/02/21 0041 06/03/21 0502  NA  --  135  --  136 135 135 134*  K  --  3.8   < > 4.3 4.4 4.5 3.9  CL  --  100  --  99 97* 98 97*  CO2  --  21*  --  '24 23 22 23  '$ GLUCOSE  --  244*  --  130* 157* 157* 99  BUN  --  64*  --  28* 35* 56* 46*  CREATININE  --  8.88*  --  5.47* 6.53* 7.61* 6.26*  CALCIUM  --  7.7*  --  8.1* 8.0* 8.3* 9.0  MG 2.3  --   --   --   --  2.2  --   PHOS 4.2 4.3  --  4.5  --   --   --    < > = values in this interval not displayed.   GFR Estimated Creatinine Clearance: 9.1 mL/min (A) (by C-G formula based on SCr of 6.26 mg/dL (H)). Liver Function Tests: Recent Labs  Lab 05/31/21 0416 06/01/21 0316 06/01/21 1147 06/02/21 0041  AST  --   --  88* 74*  ALT  --   --  52* 63*  ALKPHOS  --   --  70 90  BILITOT  --   --  0.9 0.8  PROT  --   --  6.5 6.7  ALBUMIN 2.7* 2.8* 2.7* 2.6*   No results for input(s): LIPASE, AMYLASE in the last 168 hours. No results for input(s): AMMONIA in the last 168 hours. Coagulation profile Recent Labs  Lab 06/01/21 1147  INR 1.3*    CBC: Recent Labs  Lab 05/31/21 0416 06/01/21 0314 06/01/21 1147 06/02/21 0041 06/03/21 0502  WBC 7.9 12.9* 13.0* 13.3* 12.8*  NEUTROABS 5.6  --  11.1* 10.4* 9.7*  HGB 10.8* 11.3* 11.0* 11.9* 10.6*  HCT 32.0* 33.7* 33.2* 34.6* 31.3*  MCV 91.7 92.6  93.0 92.5 91.0  PLT 134* 145* 150 157 177   Cardiac Enzymes: No results for input(s): CKTOTAL, CKMB, CKMBINDEX, TROPONINI in the last 168 hours. BNP (last 3 results) No results for input(s): PROBNP in the last 8760 hours. CBG: Recent Labs  Lab 06/05/21 1214 06/05/21 1628 06/05/21 2116 06/06/21 0618 06/06/21 1131  GLUCAP 365* 194* 223* 194* 331*   D-Dimer: No results for input(s): DDIMER in the last 72 hours. Hgb A1c: No results for input(s): HGBA1C in the last 72 hours.  Lipid Profile: No results for input(s): CHOL, HDL, LDLCALC, TRIG, CHOLHDL, LDLDIRECT in the last 72 hours.  Thyroid function studies: No results for input(s): TSH, T4TOTAL, T3FREE, THYROIDAB in the last 72 hours.  Invalid input(s): FREET3 Anemia work up: No results for input(s): VITAMINB12, FOLATE, FERRITIN, TIBC, IRON, RETICCTPCT in the last 72 hours. Sepsis Labs: Recent Labs  Lab 06/01/21 0314 06/01/21 1129 06/01/21 1147 06/01/21 1402 06/02/21 0041 06/03/21 0502  PROCALCITON  --   --   --   --  5.92 6.13  WBC 12.9*  --  13.0*  --  13.3* 12.8*  LATICACIDVEN  --  1.0  --  1.0  --   --     Microbiology Recent Results (from the past 240 hour(s))  Resp Panel by RT-PCR (Flu A&B, Covid) Nasopharyngeal Swab     Status:  None   Collection Time: 05/28/21  1:09 PM   Specimen: Nasopharyngeal Swab; Nasopharyngeal(NP) swabs in vial transport medium  Result Value Ref Range Status   SARS Coronavirus 2 by RT PCR NEGATIVE NEGATIVE Final    Comment: (NOTE) SARS-CoV-2 target nucleic acids are NOT DETECTED.  The SARS-CoV-2 RNA is generally detectable in upper respiratory specimens during the acute phase of infection. The lowest concentration of SARS-CoV-2 viral copies this assay can detect is 138 copies/mL. A negative result does not preclude SARS-Cov-2 infection and should not be used as the sole basis for treatment or other patient management decisions. A negative result may occur with  improper specimen  collection/handling, submission of specimen other than nasopharyngeal swab, presence of viral mutation(s) within the areas targeted by this assay, and inadequate number of viral copies(<138 copies/mL). A negative result must be combined with clinical observations, patient history, and epidemiological information. The expected result is Negative.  Fact Sheet for Patients:  EntrepreneurPulse.com.au  Fact Sheet for Healthcare Providers:  IncredibleEmployment.be  This test is no t yet approved or cleared by the [place] FDA and  has been authorized for detection and/or diagnosis of SARS-CoV-2 by FDA under an Emergency Use Authorization (EUA). This EUA will remain  in effect (meaning this test can be used) for the duration of the COVID-19 declaration under Section 564(b)(1) of the Act, 21 U.S.C.section 360bbb-3(b)(1), unless the authorization is terminated  or revoked sooner.       Influenza A by PCR NEGATIVE NEGATIVE Final   Influenza B by PCR NEGATIVE NEGATIVE Final    Comment: (NOTE) The Xpert Xpress SARS-CoV-2/FLU/RSV plus assay is intended as an aid in the diagnosis of influenza from Nasopharyngeal swab specimens and should not be used as a sole basis for treatment. Nasal washings and aspirates are unacceptable for Xpert Xpress SARS-CoV-2/FLU/RSV testing.  Fact Sheet for Patients: EntrepreneurPulse.com.au  Fact Sheet for Healthcare Providers: IncredibleEmployment.be  This test is not yet approved or cleared by the [place] FDA and has been authorized for detection and/or diagnosis of SARS-CoV-2 by FDA under an Emergency Use Authorization (EUA). This EUA will remain in effect (meaning this test can be used) for the duration of the COVID-19 declaration under Section 564(b)(1) of the Act, 21 U.S.C. section 360bbb-3(b)(1), unless the authorization is terminated or revoked.  Performed at [facility], [place] [address] [place], [place] [address]   MRSA Next Gen by PCR, Nasal     Status: Abnormal   Collection Time: 05/28/21  4:55 PM   Specimen: Nasal Mucosa; Nasal Swab  Result Value Ref Range Status   MRSA by PCR Next Gen DETECTED (A) NOT DETECTED Final    Comment: RESULT CALLED TO, READ BACK BY AND VERIFIED WITH: B Tora Kindred RN 1921 05/28/21 A BROWNING (NOTE) The GeneXpert MRSA Assay (FDA approved for NASAL specimens only), is one component of a comprehensive MRSA colonization surveillance program. It is not intended to diagnose MRSA infection nor to guide or monitor treatment for MRSA infections. Test performance is not FDA approved in patients less than 50 years old. Performed at [facility], [place] [address] [place], [place] [address]   Culture, blood (x 2)     Status: Abnormal   Collection Time: 06/01/21 11:29 AM   Specimen: BLOOD RIGHT ARM  Result Value Ref Range Status   Specimen Description BLOOD RIGHT ARM  Final   Special Requests   Final    BOTTLES DRAWN AEROBIC AND ANAEROBIC Blood Culture  adequate volume   Culture  Setup Time   Final    GRAM POSITIVE COCCI IN CLUSTERS AEROBIC BOTTLE ONLY CRITICAL RESULT CALLED TO, READ BACK BY AND VERIFIED WITH: pharmd Seward Meth 06/02/2021 '@1056'$  by Blima Rich Performed at [facility], [place] [address], [place], [place] [address]    Culture METHICILLIN RESISTANT STAPHYLOCOCCUS AUREUS (A)  Final   Report Status 06/04/2021 FINAL  Final   Organism ID, Bacteria METHICILLIN RESISTANT STAPHYLOCOCCUS AUREUS  Final      Susceptibility   Methicillin resistant staphylococcus aureus - MIC*    CIPROFLOXACIN >=8 RESISTANT Resistant     ERYTHROMYCIN >=8 RESISTANT Resistant     GENTAMICIN <=0.5 SENSITIVE Sensitive     OXACILLIN >=4 RESISTANT Resistant     TETRACYCLINE <=1 SENSITIVE Sensitive     VANCOMYCIN <=0.5 SENSITIVE Sensitive     TRIMETH/SULFA <=10 SENSITIVE Sensitive     CLINDAMYCIN <=0.25 SENSITIVE Sensitive      RIFAMPIN <=0.5 SENSITIVE Sensitive     Inducible Clindamycin NEGATIVE Sensitive     * METHICILLIN RESISTANT STAPHYLOCOCCUS AUREUS  Blood Culture ID Panel (Reflexed)     Status: Abnormal   Collection Time: 06/01/21 11:29 AM  Result Value Ref Range Status   Enterococcus faecalis NOT DETECTED NOT DETECTED Final   Enterococcus Faecium NOT DETECTED NOT DETECTED Final   Listeria monocytogenes NOT DETECTED NOT DETECTED Final   Staphylococcus species DETECTED (A) NOT DETECTED Final    Comment: CRITICAL RESULT CALLED TO, READ BACK BY AND VERIFIED WITH: pharmd Seward Meth 06/02/2021 '@1056'$  by Blima Rich    Staphylococcus aureus (BCID) DETECTED (A) NOT DETECTED Final    Comment: Methicillin (oxacillin)-resistant Staphylococcus aureus (MRSA). MRSA is predictably resistant to beta-lactam antibiotics (except ceftaroline). Preferred therapy is vancomycin unless clinically contraindicated. Patient requires contact precautions if  hospitalized. CRITICAL RESULT CALLED TO, READ BACK BY AND VERIFIED WITH: pharmd Seward Meth 06/02/2021 '@1056'$  by Blima Rich    Staphylococcus epidermidis NOT DETECTED NOT DETECTED Final   Staphylococcus lugdunensis NOT DETECTED NOT DETECTED Final   Streptococcus species NOT DETECTED NOT DETECTED Final   Streptococcus agalactiae NOT DETECTED NOT DETECTED Final   Streptococcus pneumoniae NOT DETECTED NOT DETECTED Final   Streptococcus pyogenes NOT DETECTED NOT DETECTED Final   A.calcoaceticus-baumannii NOT DETECTED NOT DETECTED Final   Bacteroides fragilis NOT DETECTED NOT DETECTED Final   Enterobacterales NOT DETECTED NOT DETECTED Final   Enterobacter cloacae complex NOT DETECTED NOT DETECTED Final   Escherichia coli NOT DETECTED NOT DETECTED Final   Klebsiella aerogenes NOT DETECTED NOT DETECTED Final   Klebsiella oxytoca NOT DETECTED NOT DETECTED Final   Klebsiella pneumoniae NOT DETECTED NOT DETECTED Final   Proteus species NOT DETECTED NOT DETECTED Final   Salmonella species NOT DETECTED NOT  DETECTED Final   Serratia marcescens NOT DETECTED NOT DETECTED Final   Haemophilus influenzae NOT DETECTED NOT DETECTED Final   Neisseria meningitidis NOT DETECTED NOT DETECTED Final   Pseudomonas aeruginosa NOT DETECTED NOT DETECTED Final   Stenotrophomonas maltophilia NOT DETECTED NOT DETECTED Final   Candida albicans NOT DETECTED NOT DETECTED Final   Candida auris NOT DETECTED NOT DETECTED Final   Candida glabrata NOT DETECTED NOT DETECTED Final   Candida krusei NOT DETECTED NOT DETECTED Final   Candida parapsilosis NOT DETECTED NOT DETECTED Final   Candida tropicalis NOT DETECTED NOT DETECTED Final   Cryptococcus neoformans/gattii NOT DETECTED NOT DETECTED Final   Meth resistant mecA/C and MREJ DETECTED (A) NOT DETECTED Final    Comment: CRITICAL RESULT CALLED TO, READ BACK BY  AND VERIFIED WITH: pharmd Seward Meth 06/02/2021 '@1056'$  by Blima Rich Performed at [facility], [place] [address] [place], [place] [address]   Culture, blood (x 2)     Status: None (Preliminary result)   Collection Time: 06/01/21 11:47 AM   Specimen: BLOOD  Result Value Ref Range Status   Specimen Description BLOOD BLOOD RIGHT HAND  Final   Special Requests   Final    BOTTLES DRAWN AEROBIC AND ANAEROBIC Blood Culture results may not be optimal due to an inadequate volume of blood received in culture bottles   Culture   Final    NO GROWTH 4 DAYS Performed at [facility], [place] [address] [place], [place] [address]    Report Status PENDING  Incomplete  Culture, blood (Routine X 2) w Reflex to ID Panel     Status: None (Preliminary result)   Collection Time: 06/03/21  5:03 AM   Specimen: BLOOD RIGHT ARM  Result Value Ref Range Status   Specimen Description BLOOD RIGHT ARM  Final   Special Requests   Final    BOTTLES DRAWN AEROBIC AND ANAEROBIC Blood Culture adequate volume   Culture   Final    NO GROWTH 2 DAYS Performed at [facility], [place] [address] [place], [place] [address]    Report Status  PENDING  Incomplete  Culture, blood (Routine X 2) w Reflex to ID Panel     Status: None (Preliminary result)   Collection Time: 06/03/21  5:15 AM   Specimen: BLOOD RIGHT HAND  Result Value Ref Range Status   Specimen Description BLOOD RIGHT HAND  Final   Special Requests   Final    BOTTLES DRAWN AEROBIC AND ANAEROBIC Blood Culture adequate volume   Culture   Final    NO GROWTH 2 DAYS Performed at [facility], [place] [address], [place],  [address]    Report Status PENDING  Incomplete    Procedures and diagnostic studies:  ECHO TEE  Result Date: 06-22-2021    TRANSESOPHOGEAL ECHO REPORT   Patient Name:   BENGIE CORMICAN Date of Exam: 06-22-2021 Medical Rec #:  [id number]        Height:       70.0 in Accession #:    [id number]       Weight:       135.6 lb Date of Birth:  1946-05-26         BSA:          1.769 m Patient Age:    50 years         BP:           110/54 mmHg Patient Gender: M                HR:           89 bpm. Exam Location:  Inpatient Procedure: Cardiac Doppler, Color Doppler and Transesophageal Echo Indications:     Bacteremia  History:         Patient has prior history of Echocardiogram examinations, most                  recent 06/01/2021. Risk Factors:Former Smoker, Diabetes and                  Hypertension. CKD.  Sonographer:     Clayton Lefort RDCS (AE) Referring Phys:  [id number] HAO MENG Diagnosing Phys: Skeet Latch MD PROCEDURE: After discussion of the risks and benefits  of a TEE, an informed consent was obtained from the patient. The transesophogeal probe was passed without difficulty through the esophogus of the patient. Local oropharyngeal anesthetic was provided with Cetacaine. Sedation performed by different physician. The patient was monitored while under deep sedation. Anesthestetic sedation was provided intravenously by Anesthesiology: 241.'65mg'$  of Propofol, '60mg'$  of Lidocaine. Image quality was adequate. The patient's vital signs; including heart rate, blood  pressure, and oxygen saturation; remained stable throughout the procedure. The patient developed no complications during the procedure. IMPRESSIONS  1. Left ventricular ejection fraction, by estimation, is 60 to 65%. The left ventricle has normal function. The left ventricle has no regional wall motion abnormalities. There is severe concentric left ventricular hypertrophy.  2. Right ventricular systolic function is normal. The right ventricular size is normal.  3. No left atrial/left atrial appendage thrombus was detected.  4. The mitral valve is normal in structure. Trivial mitral valve regurgitation. No evidence of mitral stenosis.  5. The aortic valve is tricuspid. Aortic valve regurgitation is not visualized. No aortic stenosis is present.  6. The inferior vena cava is normal in size with greater than 50% respiratory variability, suggesting right atrial pressure of 3 mmHg. Conclusion(s)/Recommendation(s): Normal biventricular function without evidence of hemodynamically significant valvular heart disease. FINDINGS  Left Ventricle: Left ventricular ejection fraction, by estimation, is 60 to 65%. The left ventricle has normal function. The left ventricle has no regional wall motion abnormalities. The left ventricular internal cavity size was normal in size. There is  severe concentric left ventricular hypertrophy. Right Ventricle: The right ventricular size is normal. No increase in right ventricular wall thickness. Right ventricular systolic function is normal. Left Atrium: Left atrial size was normal in size. No left atrial/left atrial appendage thrombus was detected. Right Atrium: Right atrial size was normal in size. Pericardium: There is no evidence of pericardial effusion. Mitral Valve: The mitral valve is normal in structure. Trivial mitral valve regurgitation. No evidence of mitral valve stenosis. MV peak gradient, 3.1 mmHg. The mean mitral valve gradient is 2.0 mmHg. Tricuspid Valve: The tricuspid valve  is normal in structure. Tricuspid valve regurgitation is trivial. No evidence of tricuspid stenosis. Aortic Valve: The aortic valve is tricuspid. Aortic valve regurgitation is not visualized. No aortic stenosis is present. Aortic valve mean gradient measures 4.0 mmHg. Aortic valve peak gradient measures 7.3 mmHg. Pulmonic Valve: The pulmonic valve was normal in structure. Pulmonic valve regurgitation is trivial. No evidence of pulmonic stenosis. Aorta: The aortic root is normal in size and structure. Venous: The inferior vena cava is normal in size with greater than 50% respiratory variability, suggesting right atrial pressure of 3 mmHg. IAS/Shunts: No atrial level shunt detected by color flow Doppler.  AORTIC VALVE AV Vmax:      135.00 cm/s AV Vmean:     87.800 cm/s AV VTI:       0.184 m AV Peak Grad: 7.3 mmHg AV Mean Grad: 4.0 mmHg MITRAL VALVE            TRICUSPID VALVE MV Peak grad: 3.1 mmHg  TR Peak grad:   10.7 mmHg MV Mean grad: 2.0 mmHg  TR Vmax:        163.87 cm/s MV Vmax:      0.89 m/s MV Vmean:     61.5 cm/s Skeet Latch MD Electronically signed by Skeet Latch MD Signature Date/Time: 06/04/2021/3:49:26 PM    Final                LOS: 9  days   Salah Nakamura  [facility]   Pager on [email or web address]. If 7PM-7AM, please contact night-coverage at [email or web address]     06/06/2021, 11:39 AM

## 2021-06-07 ENCOUNTER — Encounter (HOSPITAL_COMMUNITY): Payer: Self-pay | Admitting: Cardiovascular Disease

## 2021-06-07 LAB — GLUCOSE, CAPILLARY
Glucose-Capillary: 96 mg/dL (ref 70–99)
Glucose-Capillary: 252 mg/dL — ABNORMAL HIGH (ref 70–99)
Glucose-Capillary: 135 mg/dL — ABNORMAL HIGH (ref 70–99)
Glucose-Capillary: 210 mg/dL — ABNORMAL HIGH (ref 70–99)

## 2021-06-07 NOTE — Plan of Care (Signed)

## 2021-06-07 NOTE — Progress Notes (Signed)
[facility] Progress Note  Subjective: pt seen in room  Vitals:   06/06/21 1931 06/07/21 0129 06/07/21 0731 06/07/21 1135  BP: (!) 156/82 (!) 148/73 (!) 164/80 (!) 169/82  Pulse: 95 92 90 96  Resp: '17 17 20 18  '$ Temp: 98.4 F (36.9 C) 98.8 F (37.1 C) 98.4 F (36.9 C) 98.3 F (36.8 C)  TempSrc: Oral Oral Oral Oral  SpO2: 98% 93% 96% 98%  Weight:      Height:        Exam:  Alert nad  No jvd  Chest cta bilat  Cor RRR 2/6 sem   Abd soft ntnd   Ext no edema   L RC AVF+bruit    OP HD: [facility] M[facility]      400/ 1.5  70.5kg   2/2.5   L RC AVF     Assessment/ Plan: Acute encephalopathy - w/ uncont diabetes (BS > 600), HTN emergency and new seizures.  Getting IV insulin and BP meds. Neuro consulted -On Keppra.  MRI 05/30/21 with punctate areas of infarct- appear embolic vs watershed per neuro note.  Repeat MRI negative 06/01/21.     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 ID on board, TEE without endocarditis 06/04/21. ESRD - on HD MWF. HD today.  HD access: has AVF which was last intervened upon 06/2020 showing widely patent AVF w/ collaterals that may be interfering with cannulation.  AVF here functioning okay so far. Likely will need repeat fistulogram, can be accomplished as OP. Acute hypoxic RF- extubated, on RA DKA - sp insulin gtt, back on SQ insulin now.  HTN: on 3 BP lowering meds with soft BP--> off ACEi, on hydralazine 25 mg TID and amlodipine 5 mg daily, cardizem added back at 180 mg daily  Volume- euvolemic on exam, well under dry wt  Dispo: pending      Rob Shadiyah Wernli 06/07/2021, 2:29 PM   Recent Labs  Lab 06/01/21 0316 06/01/21 1147 06/02/21 0041 06/03/21 0502  K 4.3   < > 4.5 3.9  BUN 28*   < > 56* 46*  CREATININE 5.47*   < > 7.61* 6.26*  CALCIUM 8.1*   < > 8.3* 9.0  PHOS 4.5  --   --   --   HGB  --    < > 11.9* 10.6*   < > = values in  this interval not displayed.   Inpatient medications:  amLODipine  5 mg Oral Daily   aspirin  81 mg Oral Daily   atorvastatin  40 mg Oral QHS   cholecalciferol  1,000 Units Oral Daily   diltiazem  180 mg Oral Daily   feeding supplement (NEPRO CARB STEADY)  237 mL Oral BID BM   heparin injection (subcutaneous)  5,000 Units Subcutaneous Q8H   hydrALAZINE  25 mg Oral Q8H   insulin aspart  0-6 Units Subcutaneous TID WC   insulin glargine-yfgn  22 Units Subcutaneous Daily   latanoprost  1 drop Both Eyes QHS   multivitamin  1 tablet Oral QHS    sodium chloride Stopped (05/31/21 1252)   vancomycin 750 mg (06/02/21 1303)   acetaminophen, docusate sodium, heparin sodium (porcine), labetalol, lidocaine, lidocaine (PF), oxyCODONE

## 2021-06-07 NOTE — Plan of Care (Signed)
  Problem: Education: Goal: Knowledge of General Education information will improve Description: Including pain rating scale, medication(s)/side effects and non-pharmacologic comfort measures Outcome: Progressing   Problem: Health Behavior/Discharge Planning: Goal: Ability to manage health-related needs will improve Outcome: Progressing   Problem: Clinical Measurements: Goal: Ability to maintain clinical measurements within normal limits will improve Outcome: Progressing Goal: Will remain free from infection Outcome: Progressing Goal: Diagnostic test results will improve Outcome: Progressing Goal: Respiratory complications will improve Outcome: Progressing Goal: Cardiovascular complication will be avoided Outcome: Progressing   Problem: Elimination: Goal: Will not experience complications related to bowel motility Outcome: Progressing Goal: Will not experience complications related to urinary retention Outcome: Progressing   Problem: Coping: Goal: Level of anxiety will decrease Outcome: Progressing   Problem: Pain Managment: Goal: General experience of comfort will improve Outcome: Progressing   Problem: Safety: Goal: Ability to remain free from injury will improve Outcome: Progressing

## 2021-06-07 NOTE — Progress Notes (Signed)
Physical Therapy Treatment Patient Details Name: Jonathan Kane MRN: [id number] DOB: [birth year] Today's Date: 06/07/2021    History of Present Illness 75 y.o. male presenting to ED 8/11 after being found on the floor by a neighbor. CBG >600. En route to ED patient developed R gaze preference and L-sided weakness. Code stroke called. Patient developed seizure and required intubation for airway protection. SBP 229/127 in ED. Patient admitted with acute metabolic encephalopathy with hyperglycemia, hypertensive emergency and new onset seizure with status epilepticus. 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 Extubated 8/15 for HD. Overnight EEG 8/13-8/14 with new onset status epilepticus likely secondary to severe hyperglycemia. PMHx significant for ESRD on HD MWF, DMII, BPH with obstruction, Hep C, and HTN.    PT Comments    Pt progressing towards physical therapy goals. Was able to perform transfers and ambulation with gross min guard assist with occasional min assist for balance support and safety. At end of session, pt wanting to get personal items out of the closet, and therapist guarded pt as he completed tasks around the room. Occasional hands on support provided for balance, especially with sudden posterior lean at the sink to gargle mouthwash. Noted CIR signed off, stating pt does not have adequate caregiver support. Updated recommendations to reflect this. Feel SNF would be a safer option as pt still with decreased cognition and safety awareness, and feel pt is at a higher risk for falls with unsupervised mobility. Will continue to follow and progress as able per POC.    Follow Up Recommendations  SNF     Equipment Recommendations  Rolling walker with 5" wheels    Recommendations for Other Services       Precautions / Restrictions Precautions Precautions: Fall Precaution Comments: L inattention Restrictions Weight Bearing Restrictions: No    Mobility  Bed  Mobility               General bed mobility comments: Pt was received sitting EOB with NT present.    Transfers Overall transfer level: Needs assistance Equipment used: None Transfers: Sit to/from Stand Sit to Stand: Min guard         General transfer comment: Pt appears mildly unsteady however no assist required to gain/maintain standing balance.  Ambulation/Gait Ambulation/Gait assistance: Min assist;Min guard Gait Distance (Feet): 150 Feet Assistive device: None;1 person hand held assist Gait Pattern/deviations: Shuffle;Step-through pattern;Trunk flexed Gait velocity: decreased Gait velocity interpretation: 1.31 - 2.62 ft/sec, indicative of limited community ambulator General Gait Details: VC's for improved posture. Hands on guarding for safety grossly with unsteadiness noted. With the addition of HHA pt appeared steadier, however he reports no change.   Stairs             Wheelchair Mobility    Modified Rankin (Stroke Patients Only) Modified Rankin (Stroke Patients Only) Pre-Morbid Rankin Score: No symptoms Modified Rankin: Moderately severe disability     Balance Overall balance assessment: Needs assistance Sitting-balance support: Feet supported Sitting balance-Leahy Scale: Fair Sitting balance - Comments: no LOB noted this date and no pushing.  Able to achieve and maintain midline sitting without assist Postural control: Posterior lean;Left lateral lean Standing balance support: Single extremity supported Standing balance-Leahy Scale: Poor Standing balance comment: reliant on UE support and external assist. Posterior lean with mobility                            Cognition Arousal/Alertness: Awake/alert Behavior During Therapy: [facility] for  tasks assessed/performed Overall Cognitive Status: Impaired/Different from baseline Area of Impairment: Following commands;Problem solving;Safety/judgement                 Orientation Level:  Disoriented to;Situation Current Attention Level: Sustained Memory: Decreased short-term memory Following Commands: Follows one step commands with increased time Safety/Judgement: Decreased awareness of deficits;Decreased awareness of safety Awareness: Emergent Problem Solving: Requires verbal cues;Slow processing        Exercises      General Comments        Pertinent Vitals/Pain Pain Assessment: No/denies pain Faces Pain Scale: Hurts whole lot Pain Intervention(s): Monitored during session    Home Living Family/patient expects to be discharged to:: Private residence Living Arrangements: Alone Available Help at Discharge: Family;Available PRN/intermittently Type of Home: House Home Access: Stairs to enter Entrance Stairs-Rails: None Home Layout: One level Home Equipment: Environmental consultant - 2 wheels;Cane - single point;Grab bars - tub/shower;Grab bars - toilet;Shower seat - built in      Prior Function Level of Independence: Independent      Comments: Patient is a questionable historian. Reports I with ADLs/IADLs; reports driving; confirms home set-up from med chart obtained at time of last admission in 2021.   PT Goals (current goals can now be found in the care plan section) Acute Rehab PT Goals Patient Stated Goal: Move better and drive PT Goal Formulation: With patient Time For Goal Achievement: 06/16/21 Potential to Achieve Goals: Good Progress towards PT goals: Progressing toward goals    Frequency    Min 3X/week      PT Plan Discharge plan needs to be updated;Frequency needs to be updated    Co-evaluation              AM-PAC PT "6 Clicks" Mobility   Outcome Measure  Help needed turning from your back to your side while in a flat bed without using bedrails?: A Little Help needed moving from lying on your back to sitting on the side of a flat bed without using bedrails?: A Little Help needed moving to and from a bed to a chair (including a wheelchair)?:  A Little Help needed standing up from a chair using your arms (e.g., wheelchair or bedside chair)?: A Little Help needed to walk in hospital room?: A Little Help needed climbing 3-5 steps with a railing? : A Lot 6 Click Score: 17    End of Session Equipment Utilized During Treatment: Gait belt Activity Tolerance: Patient tolerated treatment well Patient left: in bed;with call bell/phone within reach;Other (comment) (Sitting EOB awaiting NT to return with a drink) Nurse Communication: Mobility status PT Visit Diagnosis: Unsteadiness on feet (R26.81);Muscle weakness (generalized) (M62.81)     Time: [id number] PT Time Calculation (min) (ACUTE ONLY): 19 min  Charges:  $Gait Training: 8-22 mins                     Rolinda Roan, PT, DPT Acute Rehabilitation Services Pager: [phone] Office: [place] 06/07/2021, 4:10 PM

## 2021-06-07 NOTE — Progress Notes (Signed)
   Inpatient Rehabilitation Admissions Coordinator   Patient not a candidate for CIR for has no caregiver supports. SW is assisting with SNF.  Danne Baxter, RN, MSN Rehab Admissions Coordinator [phone] 06/07/2021 2:46 PM

## 2021-06-07 NOTE — Progress Notes (Signed)
Inpatient Diabetes Program Recommendations  AACE/ADA: New Consensus Statement on Inpatient Glycemic Control (2015)  Target Ranges:  Prepandial:   less than 140 mg/dL      Peak postprandial:   less than 180 mg/dL (1-2 hours)      Critically ill patients:  140 - 180 mg/dL   Lab Results  Component Value Date   GLUCAP 252 (H) 06/07/2021   HGBA1C 11.5 (H) 06/01/2021    Review of Glycemic Control Results for SORRELL, APOSTLE (MRN [id number]) as of 06/07/2021 13:20  Ref. Range 06/06/2021 20:09 06/07/2021 06:14 06/07/2021 12:10  Glucose-Capillary Latest Ref Range: 70 - 99 mg/dL 273 (H) 96 252 (H)   Diabetes history: Type 2 DM Outpatient Diabetes medications: Tresiba 32 units QD, Tradjenta 5 mg QD, Glipizide 10 mg QD, Novolog 0-7 units TID Current orders for Inpatient glycemic control: Semglee 22 units QD, Novolog 0-6 units TID  Inpatient Diabetes Program Recommendations:    If post prandials continue to exceed inpatient goals of 180 mg/dL, consider adding Novolog 2 units TID (assuming patient is consuming >50% of meals).   Thanks, Bronson Curb, MSN, RNC-OB Diabetes Coordinator [phone] (8a-5p)

## 2021-06-07 NOTE — Progress Notes (Signed)
Progress Note    KWAMANE RESPICIO  [id number] DOB: [birth year]  DOA: 05/28/2021 PCP: Cipriano Mile, NP      Brief Narrative:    Medical records reviewed and are as summarized below:  CHARITY SCHILL is a 75 y.o. male with medical history significant for type II DM, ESRD on hemodialysis, hypertension, substance use disorder, alcohol use disorder, hepatitis C infection, HSV infection, BPH.  He was brought to the hospital because of unresponsiveness and was found to have severe hyperglycemia and hypertensive emergency.  He developed new onset seizures with status epilepticus.  He was intubated and placed on mechanical ventilation.   Neurologist was consulted to assist with management.  He was started on Keppra.  Keppra was later discontinued since seizure was provoked  Work-up also revealed MRSA sepsis secondary to MRSA bacteremia.  He was treated with IV antibiotics.  There was no evidence of vegetation or cardiac thrombus on TEE.  ID was consulted to assist with management.  He has uncontrolled diabetes and he had episodes of hypoglycemia and hyperglycemia in the hospital.  Hemoglobin A1c is 11.5.  Insulin therapy was adjusted accordingly.  He was evaluated by PT and OT and further rehabilitation was recommended.    Assessment/Plan:   Principal Problem:   MRSA bacteremia Active Problems:   DKA (diabetic ketoacidosis) ([place])   Status epilepticus ([place])   Malnutrition of moderate degree   Nutrition Problem: Moderate Malnutrition Etiology: chronic illness (ESRD on HD, DM)  Signs/Symptoms: mild fat depletion, moderate fat depletion, mild muscle depletion, moderate muscle depletion   Body mass index is 19.45 kg/m.   Acute metabolic encephalopathy, possible subacute stroke in both cerebral hemispheres: Continue aspirin and Lipitor.  Outpatient follow-up for 30-day event monitor.  Headache: Improved  Hypertension with recent hypertensive emergency: Continue  antihypertensives.  2D echo showed EF estimated at 60 to 65%, severe LVH, grade 1 diastolic dysfunction  New seizures with status epilepticus: Keppra has been discontinued by the neurologist since seizure was provoked. Video EEG did not show any seizures or definitive epileptiform discharges.  There were changes suggestive of cortical dysfunction in the right hemisphere.  MRSA sepsis secondary to MRSA bacteremia:  1 out of 4 culture bottles positive for MRSA.  No growth on repeat blood culture. Dialysis permacath was removed from the right upper chest on 06/03/2021. No evidence of atrial thrombus or valvular vegetations on TEE. Continue IV vancomycin to be given with hemodialysis through 07/15/2021 to complete a 6-week course of treatment.    ESRD: Follow-up with nephrologist for hemodialysis.  Brittle IDDM with hyperglycemia and hypoglycemia: Hemoglobin A1c is 11.5.  Continue insulin glargine and NovoLog.  Monitor glucose levels closely.    Acute hypoxic respiratory failure: S/p extubation on 05/31/2021.  He is tolerating room air.  Case discussed with Laveda Abbe, social worker.  She is working on placement to SNF.  Diet Order             Diet renal/carb modified with fluid restriction Diet-HS Snack? Nothing; Fluid restriction: 1200 mL Fluid; Room service appropriate? Yes; Fluid consistency: Thin  Diet effective now                      Consultants: Neurologist Nephrologist Intensivist  Procedures: Video EEG TEE on 06/04/2021    Medications:    amLODipine  5 mg Oral Daily   aspirin  81 mg Oral Daily   atorvastatin  40 mg Oral QHS   cholecalciferol  1,000 Units Oral Daily   diltiazem  180 mg Oral Daily   feeding supplement (NEPRO CARB STEADY)  237 mL Oral BID BM   heparin injection (subcutaneous)  5,000 Units Subcutaneous Q8H   hydrALAZINE  25 mg Oral Q8H   insulin aspart  0-6 Units Subcutaneous TID WC   insulin glargine-yfgn  22 Units Subcutaneous Daily    latanoprost  1 drop Both Eyes QHS   multivitamin  1 tablet Oral QHS   Continuous Infusions:  sodium chloride Stopped (05/31/21 1252)   vancomycin 750 mg (06/02/21 1303)     Anti-infectives (From admission, onward)    Start     Dose/Rate Route Frequency Ordered Stop   06/04/21 2100  vancomycin (VANCOCIN) IVPB 750 mg/150 ml premix        750 mg 150 mL/hr over 60 Minutes Intravenous  Once 06/04/21 2007 06/04/21 2228   06/02/21 1200  vancomycin (VANCOCIN) IVPB 750 mg/150 ml premix        750 mg 150 mL/hr over 60 Minutes Intravenous Every M-W-F (Hemodialysis) 06/01/21 1144     06/01/21 1230  piperacillin-tazobactam (ZOSYN) IVPB 2.25 g  Status:  Discontinued        2.25 g 100 mL/hr over 30 Minutes Intravenous Every 8 hours 06/01/21 1134 06/02/21 1250   06/01/21 1200  metroNIDAZOLE (FLAGYL) IVPB 500 mg  Status:  Discontinued        500 mg 100 mL/hr over 60 Minutes Intravenous Every 8 hours 06/01/21 1103 06/01/21 1125   06/01/21 1200  vancomycin (VANCOREADY) IVPB 1500 mg/300 mL        1,500 mg 150 mL/hr over 120 Minutes Intravenous  Once 06/01/21 1103 06/01/21 1549   06/01/21 1200  piperacillin-tazobactam (ZOSYN) IVPB 2.25 g  Status:  Discontinued        2.25 g 100 mL/hr over 30 Minutes Intravenous Every 8 hours 06/01/21 1125 06/01/21 1134              Family Communication/Anticipated D/C date and plan/Code Status   DVT prophylaxis: heparin injection 5,000 Units Start: 05/28/21 2200     Code Status: Full Code  Family Communication: None Disposition Plan:    Status is: Inpatient  Remains inpatient appropriate because:IV treatments appropriate due to intensity of illness or inability to take PO and Inpatient level of care appropriate due to severity of illness  Dispo: The patient is from: Home              Anticipated d/c is to: [facility]              Patient currently is medically stable to d/c.   Difficult to place patient No           Subjective:   Interval  events noted.  Headache is better.  No dizziness, chest pain or shortness of breath.  Objective:    Vitals:   06/06/21 1931 06/07/21 0129 06/07/21 0731 06/07/21 1135  BP: (!) 156/82 (!) 148/73 (!) 164/80 (!) 169/82  Pulse: 95 92 90 96  Resp: '17 17 20 18  '$ Temp: 98.4 F (36.9 C) 98.8 F (37.1 C) 98.4 F (36.9 C) 98.3 F (36.8 C)  TempSrc: Oral Oral Oral Oral  SpO2: 98% 93% 96% 98%  Weight:      Height:       No data found.   Intake/Output Summary (Last 24 hours) at 06/07/2021 1210 Last data filed at 06/06/2021 1800 Gross per 24 hour  Intake 500 ml  Output --  Net  500 ml   Filed Weights   06/02/21 1030 06/02/21 1353 06/04/21 1212  Weight: 62.1 kg 61.5 kg 61.5 kg    Exam:  GEN: NAD SKIN: No rash EYES: EOMI, no pallor or icterus ENT: MMM CV: RRR PULM: CTA B ABD: soft, ND, NT, +BS CNS: AAO x 3, non focal EXT: No edema or tenderness                  Data Reviewed:   I have personally reviewed following labs and imaging studies:  Labs: Labs show the following:   Basic Metabolic Panel: Recent Labs  Lab 06/01/21 0316 06/01/21 1147 06/02/21 0041 06/03/21 0502  NA 136 135 135 134*  K 4.3 4.4 4.5 3.9  CL 99 97* 98 97*  CO2 '24 23 22 23  '$ GLUCOSE 130* 157* 157* 99  BUN 28* 35* 56* 46*  CREATININE 5.47* 6.53* 7.61* 6.26*  CALCIUM 8.1* 8.0* 8.3* 9.0  MG  --   --  2.2  --   PHOS 4.5  --   --   --    GFR Estimated Creatinine Clearance: 9.1 mL/min (A) (by C-G formula based on SCr of 6.26 mg/dL (H)). Liver Function Tests: Recent Labs  Lab 06/01/21 0316 06/01/21 1147 06/02/21 0041  AST  --  88* 74*  ALT  --  52* 63*  ALKPHOS  --  70 90  BILITOT  --  0.9 0.8  PROT  --  6.5 6.7  ALBUMIN 2.8* 2.7* 2.6*   No results for input(s): LIPASE, AMYLASE in the last 168 hours. No results for input(s): AMMONIA in the last 168 hours. Coagulation profile Recent Labs  Lab 06/01/21 1147  INR 1.3*    CBC: Recent Labs  Lab 06/01/21 0314  06/01/21 1147 06/02/21 0041 06/03/21 0502  WBC 12.9* 13.0* 13.3* 12.8*  NEUTROABS  --  11.1* 10.4* 9.7*  HGB 11.3* 11.0* 11.9* 10.6*  HCT 33.7* 33.2* 34.6* 31.3*  MCV 92.6 93.0 92.5 91.0  PLT 145* 150 157 177   Cardiac Enzymes: No results for input(s): CKTOTAL, CKMB, CKMBINDEX, TROPONINI in the last 168 hours. BNP (last 3 results) No results for input(s): PROBNP in the last 8760 hours. CBG: Recent Labs  Lab 06/06/21 0618 06/06/21 1131 06/06/21 1609 06/06/21 2009 06/07/21 0614  GLUCAP 194* 331* 240* 273* 96   D-Dimer: No results for input(s): DDIMER in the last 72 hours. Hgb A1c: No results for input(s): HGBA1C in the last 72 hours.  Lipid Profile: No results for input(s): CHOL, HDL, LDLCALC, TRIG, CHOLHDL, LDLDIRECT in the last 72 hours.  Thyroid function studies: No results for input(s): TSH, T4TOTAL, T3FREE, THYROIDAB in the last 72 hours.  Invalid input(s): FREET3 Anemia work up: No results for input(s): VITAMINB12, FOLATE, FERRITIN, TIBC, IRON, RETICCTPCT in the last 72 hours. Sepsis Labs: Recent Labs  Lab 06/01/21 0314 06/01/21 1129 06/01/21 1147 06/01/21 1402 06/02/21 0041 06/03/21 0502  PROCALCITON  --   --   --   --  5.92 6.13  WBC 12.9*  --  13.0*  --  13.3* 12.8*  LATICACIDVEN  --  1.0  --  1.0  --   --     Microbiology Recent Results (from the past 240 hour(s))  Resp Panel by RT-PCR (Flu A&B, Covid) Nasopharyngeal Swab     Status: None   Collection Time: 05/28/21  1:09 PM   Specimen: Nasopharyngeal Swab; Nasopharyngeal(NP) swabs in vial transport medium  Result Value Ref Range Status   SARS Coronavirus  2 by RT PCR NEGATIVE NEGATIVE Final    Comment: (NOTE) SARS-CoV-2 target nucleic acids are NOT DETECTED.  The SARS-CoV-2 RNA is generally detectable in upper respiratory specimens during the acute phase of infection. The lowest concentration of SARS-CoV-2 viral copies this assay can detect is 138 copies/mL. A negative result does not preclude  SARS-Cov-2 infection and should not be used as the sole basis for treatment or other patient management decisions. A negative result may occur with  improper specimen collection/handling, submission of specimen other than nasopharyngeal swab, presence of viral mutation(s) within the areas targeted by this assay, and inadequate number of viral copies(<138 copies/mL). A negative result must be combined with clinical observations, patient history, and epidemiological information. The expected result is Negative.  Fact Sheet for Patients:  EntrepreneurPulse.com.au  Fact Sheet for Healthcare Providers:  IncredibleEmployment.be  This test is no t yet approved or cleared by the [place] FDA and  has been authorized for detection and/or diagnosis of SARS-CoV-2 by FDA under an Emergency Use Authorization (EUA). This EUA will remain  in effect (meaning this test can be used) for the duration of the COVID-19 declaration under Section 564(b)(1) of the Act, 21 U.S.C.section 360bbb-3(b)(1), unless the authorization is terminated  or revoked sooner.       Influenza A by PCR NEGATIVE NEGATIVE Final   Influenza B by PCR NEGATIVE NEGATIVE Final    Comment: (NOTE) The Xpert Xpress SARS-CoV-2/FLU/RSV plus assay is intended as an aid in the diagnosis of influenza from Nasopharyngeal swab specimens and should not be used as a sole basis for treatment. Nasal washings and aspirates are unacceptable for Xpert Xpress SARS-CoV-2/FLU/RSV testing.  Fact Sheet for Patients: EntrepreneurPulse.com.au  Fact Sheet for Healthcare Providers: IncredibleEmployment.be  This test is not yet approved or cleared by the [place] FDA and has been authorized for detection and/or diagnosis of SARS-CoV-2 by FDA under an Emergency Use Authorization (EUA). This EUA will remain in effect (meaning this test can be used) for the duration of  the COVID-19 declaration under Section 564(b)(1) of the Act, 21 U.S.C. section 360bbb-3(b)(1), unless the authorization is terminated or revoked.  Performed at [facility], [place] [address], [place], [place] [address]   MRSA Next Gen by PCR, Nasal     Status: Abnormal   Collection Time: 05/28/21  4:55 PM   Specimen: Nasal Mucosa; Nasal Swab  Result Value Ref Range Status   MRSA by PCR Next Gen DETECTED (A) NOT DETECTED Final    Comment: RESULT CALLED TO, READ BACK BY AND VERIFIED WITH: B Tora Kindred RN 1921 05/28/21 A BROWNING (NOTE) The GeneXpert MRSA Assay (FDA approved for NASAL specimens only), is one component of a comprehensive MRSA colonization surveillance program. It is not intended to diagnose MRSA infection nor to guide or monitor treatment for MRSA infections. Test performance is not FDA approved in patients less than 79 years old. Performed at [facility], [place] [address], [place], [place] [address]   Culture, blood (x 2)     Status: Abnormal   Collection Time: 06/01/21 11:29 AM   Specimen: BLOOD RIGHT ARM  Result Value Ref Range Status   Specimen Description BLOOD RIGHT ARM  Final   Special Requests   Final    BOTTLES DRAWN AEROBIC AND ANAEROBIC Blood Culture adequate volume   Culture  Setup Time   Final    GRAM POSITIVE COCCI IN CLUSTERS AEROBIC BOTTLE ONLY CRITICAL RESULT CALLED TO, READ BACK BY AND  VERIFIED WITH: pharmd Seward Meth 06/02/2021 '@1056'$  by Blima Rich Performed at [facility], [place] [address], [place], [place] [address]    Culture METHICILLIN RESISTANT STAPHYLOCOCCUS AUREUS (A)  Final   Report Status 06/04/2021 FINAL  Final   Organism ID, Bacteria METHICILLIN RESISTANT STAPHYLOCOCCUS AUREUS  Final      Susceptibility   Methicillin resistant staphylococcus aureus - MIC*    CIPROFLOXACIN >=8 RESISTANT Resistant     ERYTHROMYCIN >=8 RESISTANT Resistant     GENTAMICIN <=0.5 SENSITIVE Sensitive     OXACILLIN >=4 RESISTANT Resistant      TETRACYCLINE <=1 SENSITIVE Sensitive     VANCOMYCIN <=0.5 SENSITIVE Sensitive     TRIMETH/SULFA <=10 SENSITIVE Sensitive     CLINDAMYCIN <=0.25 SENSITIVE Sensitive     RIFAMPIN <=0.5 SENSITIVE Sensitive     Inducible Clindamycin NEGATIVE Sensitive     * METHICILLIN RESISTANT STAPHYLOCOCCUS AUREUS  Blood Culture ID Panel (Reflexed)     Status: Abnormal   Collection Time: 06/01/21 11:29 AM  Result Value Ref Range Status   Enterococcus faecalis NOT DETECTED NOT DETECTED Final   Enterococcus Faecium NOT DETECTED NOT DETECTED Final   Listeria monocytogenes NOT DETECTED NOT DETECTED Final   Staphylococcus species DETECTED (A) NOT DETECTED Final    Comment: CRITICAL RESULT CALLED TO, READ BACK BY AND VERIFIED WITH: pharmd Seward Meth 06/02/2021 '@1056'$  by Blima Rich    Staphylococcus aureus (BCID) DETECTED (A) NOT DETECTED Final    Comment: Methicillin (oxacillin)-resistant Staphylococcus aureus (MRSA). MRSA is predictably resistant to beta-lactam antibiotics (except ceftaroline). Preferred therapy is vancomycin unless clinically contraindicated. Patient requires contact precautions if  hospitalized. CRITICAL RESULT CALLED TO, READ BACK BY AND VERIFIED WITH: pharmd Seward Meth 06/02/2021 '@1056'$  by Blima Rich    Staphylococcus epidermidis NOT DETECTED NOT DETECTED Final   Staphylococcus lugdunensis NOT DETECTED NOT DETECTED Final   Streptococcus species NOT DETECTED NOT DETECTED Final   Streptococcus agalactiae NOT DETECTED NOT DETECTED Final   Streptococcus pneumoniae NOT DETECTED NOT DETECTED Final   Streptococcus pyogenes NOT DETECTED NOT DETECTED Final   A.calcoaceticus-baumannii NOT DETECTED NOT DETECTED Final   Bacteroides fragilis NOT DETECTED NOT DETECTED Final   Enterobacterales NOT DETECTED NOT DETECTED Final   Enterobacter cloacae complex NOT DETECTED NOT DETECTED Final   Escherichia coli NOT DETECTED NOT DETECTED Final   Klebsiella aerogenes NOT DETECTED NOT DETECTED Final   Klebsiella oxytoca NOT DETECTED  NOT DETECTED Final   Klebsiella pneumoniae NOT DETECTED NOT DETECTED Final   Proteus species NOT DETECTED NOT DETECTED Final   Salmonella species NOT DETECTED NOT DETECTED Final   Serratia marcescens NOT DETECTED NOT DETECTED Final   Haemophilus influenzae NOT DETECTED NOT DETECTED Final   Neisseria meningitidis NOT DETECTED NOT DETECTED Final   Pseudomonas aeruginosa NOT DETECTED NOT DETECTED Final   Stenotrophomonas maltophilia NOT DETECTED NOT DETECTED Final   Candida albicans NOT DETECTED NOT DETECTED Final   Candida auris NOT DETECTED NOT DETECTED Final   Candida glabrata NOT DETECTED NOT DETECTED Final   Candida krusei NOT DETECTED NOT DETECTED Final   Candida parapsilosis NOT DETECTED NOT DETECTED Final   Candida tropicalis NOT DETECTED NOT DETECTED Final   Cryptococcus neoformans/gattii NOT DETECTED NOT DETECTED Final   Meth resistant mecA/C and MREJ DETECTED (A) NOT DETECTED Final    Comment: CRITICAL RESULT CALLED TO, READ BACK BY AND VERIFIED WITH: pharmd Seward Meth 06/02/2021 '@1056'$  by Blima Rich Performed at [facility], [address] [place],  [address]   Culture, blood (x 2)  Status: None   Collection Time: 06/01/21 11:47 AM   Specimen: BLOOD  Result Value Ref Range Status   Specimen Description BLOOD BLOOD RIGHT HAND  Final   Special Requests   Final    BOTTLES DRAWN AEROBIC AND ANAEROBIC Blood Culture results may not be optimal due to an inadequate volume of blood received in culture bottles   Culture   Final    NO GROWTH 5 DAYS Performed at [facility], [place] [address] [place], [place] [address]    Report Status 06/06/2021 FINAL  Final  Culture, blood (Routine X 2) w Reflex to ID Panel     Status: None (Preliminary result)   Collection Time: 06/03/21  5:03 AM   Specimen: BLOOD RIGHT ARM  Result Value Ref Range Status   Specimen Description BLOOD RIGHT ARM  Final   Special Requests   Final    BOTTLES DRAWN AEROBIC AND ANAEROBIC Blood Culture  adequate volume   Culture   Final    NO GROWTH 4 DAYS Performed at [facility], [place] [address], [place], [place] [address]    Report Status PENDING  Incomplete  Culture, blood (Routine X 2) w Reflex to ID Panel     Status: None (Preliminary result)   Collection Time: 06/03/21  5:15 AM   Specimen: BLOOD RIGHT HAND  Result Value Ref Range Status   Specimen Description BLOOD RIGHT HAND  Final   Special Requests   Final    BOTTLES DRAWN AEROBIC AND ANAEROBIC Blood Culture adequate volume   Culture   Final    NO GROWTH 4 DAYS Performed at [facility], [place] [address] [place], [place] [address]    Report Status PENDING  Incomplete    Procedures and diagnostic studies:  No results found.             LOS: 10 days   Natasia Sanko  [facility]   Pager on [email or web address]. If 7PM-7AM, please contact night-coverage at [email or web address]     06/07/2021, 12:10 PM

## 2021-06-07 NOTE — TOC Progression Note (Signed)
Transition of Care [facility]) - Progression Note    Patient Details  Name: Jonathan Kane MRN: [id number] Date of Birth: [birth year]  Transition of Care [facility]) CM/SW [place], [place] Phone Number: 06/07/2021, 12:53 PM  Clinical Narrative:   CSW following for discharge to SNF. CSW completed SCAT application and met with patient to review and sign. CSW sent in application to [place] for review and approval. CSW confirmed bed availability at [facility], and will start insurance authorization after PT update today. CSW to follow.    Expected Discharge Plan: [place] Barriers to Discharge: Continued Medical Work up, Ship broker  Expected Discharge Plan and Services Expected Discharge Plan: [place] Choice: [place] arrangements for the past 2 months: Single Family Home                                       Social Determinants of Health (SDOH) Interventions    Readmission Risk Interventions No flowsheet data found.

## 2021-06-07 NOTE — Plan of Care (Signed)
  Problem: Coping: Goal: Level of anxiety will decrease Outcome: Progressing   Problem: Pain Managment: Goal: General experience of comfort will improve Outcome: Progressing   Problem: Safety: Goal: Ability to remain free from injury will improve Outcome: Progressing   Problem: Skin Integrity: Goal: Risk for impaired skin integrity will decrease Outcome: Progressing   

## 2021-06-08 LAB — CULTURE, BLOOD (ROUTINE X 2)
Culture: NO GROWTH
Culture: NO GROWTH
Special Requests: ADEQUATE
Special Requests: ADEQUATE

## 2021-06-08 LAB — RENAL FUNCTION PANEL
Albumin: 2.5 g/dL — ABNORMAL LOW (ref 3.5–5.0)
Anion gap: 17 — ABNORMAL HIGH (ref 5–15)
BUN: 111 mg/dL — ABNORMAL HIGH (ref 8–23)
CO2: 19 mmol/L — ABNORMAL LOW (ref 22–32)
Calcium: 8.6 mg/dL — ABNORMAL LOW (ref 8.9–10.3)
Chloride: 98 mmol/L (ref 98–111)
Creatinine, Ser: 11.75 mg/dL — ABNORMAL HIGH (ref 0.61–1.24)
GFR, Estimated: 4 mL/min — ABNORMAL LOW (ref >60)
Glucose, Bld: 153 mg/dL — ABNORMAL HIGH (ref 70–99)
Phosphorus: 6.2 mg/dL — ABNORMAL HIGH (ref 2.5–4.6)
Potassium: 4.1 mmol/L (ref 3.5–5.1)
Sodium: 134 mmol/L — ABNORMAL LOW (ref 135–145)

## 2021-06-08 LAB — CBC
HCT: 26.9 % — ABNORMAL LOW (ref 39.0–52.0)
Hemoglobin: 9.1 g/dL — ABNORMAL LOW (ref 13.0–17.0)
MCH: 31.2 pg (ref 26.0–34.0)
MCHC: 33.8 g/dL (ref 30.0–36.0)
MCV: 92.1 fL (ref 80.0–100.0)
Platelets: 361 10*3/uL (ref 150–400)
RBC: 2.92 MIL/uL — ABNORMAL LOW (ref 4.22–5.81)
RDW: 14.3 % (ref 11.5–15.5)
WBC: 10.6 10*3/uL — ABNORMAL HIGH (ref 4.0–10.5)
nRBC: 0 % (ref 0.0–0.2)

## 2021-06-08 LAB — GLUCOSE, CAPILLARY
Glucose-Capillary: 103 mg/dL — ABNORMAL HIGH (ref 70–99)
Glucose-Capillary: 248 mg/dL — ABNORMAL HIGH (ref 70–99)
Glucose-Capillary: 308 mg/dL — ABNORMAL HIGH (ref 70–99)
Glucose-Capillary: 207 mg/dL — ABNORMAL HIGH (ref 70–99)

## 2021-06-08 LAB — RESP PANEL BY RT-PCR (FLU A&B, COVID) ARPGX2
Influenza A by PCR: NEGATIVE
Influenza B by PCR: NEGATIVE
SARS Coronavirus 2 by RT PCR: NEGATIVE

## 2021-06-08 MED ORDER — HYDRALAZINE HCL 25 MG PO TABS: 25.0000 mg | ORAL_TABLET | Freq: Three times a day (TID) | ORAL | Status: DC

## 2021-06-08 MED ORDER — ACETAMINOPHEN 325 MG PO TABS
ORAL_TABLET | ORAL | Status: AC
  Administered 2021-06-08: 650 mg via ORAL
  Filled 2021-06-08: qty 2

## 2021-06-08 MED ORDER — VANCOMYCIN HCL IN DEXTROSE 750-5 MG/150ML-% IV SOLN: 750.0000 mg | INTRAVENOUS | Status: AC

## 2021-06-08 MED ORDER — TRESIBA FLEXTOUCH 100 UNIT/ML ~~LOC~~ SOPN: 22.0000 [IU] | PEN_INJECTOR | Freq: Every day | SUBCUTANEOUS | Status: AC

## 2021-06-08 MED ORDER — ACETAMINOPHEN 325 MG PO TABS: 650.0000 mg | ORAL_TABLET | Freq: Four times a day (QID) | ORAL | Status: AC | PRN

## 2021-06-08 NOTE — Discharge Summary (Signed)
Physician Discharge Summary  Jonathan Kane [id number] DOB: August 15, 1946 DOA: 05/28/2021  PCP: Jonathan Mile, NP  Admit date: 05/28/2021 Discharge date: 06/08/2021  Discharge disposition: SNF   Recommendations for Outpatient Follow-Up:   Continue outpatient hemodialysis as scheduled on Mondays, Wednesdays and Fridays. Follow-up with cardiologist for outpatient event monitor.   Discharge Diagnosis:   Principal Problem:   MRSA bacteremia Active Problems:   DKA (diabetic ketoacidosis) ([place])   Status epilepticus ([place])   Malnutrition of moderate degree    Discharge Condition: Stable.  Diet recommendation:  Diet Order             Diet - low sodium heart healthy           Diet renal/carb modified with fluid restriction Diet-HS Snack? Nothing; Fluid restriction: 1200 mL Fluid; Room service appropriate? Yes; Fluid consistency: Thin  Diet effective now                     Code Status: Full Code     Hospital Course:   Mr. Jonathan Kane is a 75 y.o. male with medical history significant for type II DM, ESRD on hemodialysis, hypertension, substance use disorder, alcohol use disorder, hepatitis C infection, HSV infection, BPH.  He was brought to the hospital because of unresponsiveness and was found to have severe hyperglycemia and hypertensive emergency.  He developed new onset seizures with status epilepticus.  He was intubated and placed on mechanical ventilation.    Neurologist was consulted to assist with management.  He was started on Keppra.  Keppra was later discontinued since seizure was provoked   Work-up also revealed MRSA sepsis secondary to MRSA bacteremia.  He was treated with IV antibiotics.  There was no evidence of vegetation or cardiac thrombus on TEE.  Right IJ tunneled hemodialysis catheter was removed on 06/03/2021.  ID was consulted to assist with management and he was recommended outpatient discharge on IV vancomycin to be given at hemodialysis on  Mondays, Wednesdays and Fridays through 07/15/2021.Marland Kitchen   He has uncontrolled diabetes and he had episodes of hypoglycemia and hyperglycemia in the hospital.  Hemoglobin A1c is 11.5.  Insulin therapy was adjusted accordingly.   He was evaluated by PT and OT who recommended further rehabilitation at a skilled nursing facility.   Medical Consultants:   Nephrologist Infectious disease Neurologist Intensivist   Discharge Exam:    Vitals:   06/08/21 0435 06/08/21 0440 06/08/21 0742 06/08/21 1146  BP: (!) 147/74 (!) 154/79 (!) 153/81 (!) 150/75  Pulse: 87 84 93 97  Resp: '16 16 20 18  '$ Temp:  98.9 F (37.2 C) 98.4 F (36.9 C) 98.4 F (36.9 C)  TempSrc:  Oral Oral Oral  SpO2: 98% 98%  97%  Weight:  61.1 kg    Height:         GEN: NAD SKIN: No rash EYES: No pallor or icterus ENT: MMM CV: RRR PULM: CTA B ABD: soft, ND, NT, +BS CNS: AAO x 3, non focal EXT: No edema or tenderness      The results of significant diagnostics from this hospitalization (including imaging, microbiology, ancillary and laboratory) are listed below for reference.     Procedures and Diagnostic Studies:   DG CHEST PORT 1 VIEW  Result Date: 05/28/2021 CLINICAL DATA:  Central line placement. EXAM: PORTABLE CHEST 1 VIEW COMPARISON:  Radiograph earlier today. FINDINGS: There is a new left internal jugular central venous catheter with tip overlying the mid distal SVC. No  pneumothorax. Endotracheal tube remains in place. There is a new enteric tube with tip stomach, the side port is in the region of the gastroesophageal junction. Previous right-sided dialysis catheter is unchanged. Heart is normal in size. Stable mediastinal contours. Improving left basilar atelectasis. No new airspace disease. No pleural effusion. IMPRESSION: 1. New left internal jugular central venous catheter with tip overlying the mid distal SVC. No pneumothorax. 2. New enteric tube with tip in the stomach, side port is in the region of the  gastroesophageal junction. Recommend advancement of at least 3 cm for optimal placement. 3. Other support apparatus unchanged. 4. Improving left basilar atelectasis. Electronically Signed   By: Keith Rake M.D.   On: 05/28/2021 17:29   DG Chest Port 1 View  Result Date: 05/28/2021 CLINICAL DATA:  Intubation, post code EXAM: PORTABLE CHEST 1 VIEW COMPARISON:  Portable exam 1342 hours compared to 12/17/2020 FINDINGS: Tip of endotracheal tube projects 2.2 cm above carina. RIGHT jugular central venous catheter tip projects over RIGHT atrium. Normal heart size, mediastinal contours, and pulmonary vascularity. LEFT basilar atelectasis. Lungs otherwise clear. No infiltrate, pleural effusion, or pneumothorax. No acute osseous findings. IMPRESSION: LEFT basilar atelectasis. Electronically Signed   By: Lavonia Dana M.D.   On: 05/28/2021 14:15   DG Abd Portable 1V  Result Date: 05/29/2021 CLINICAL DATA:  OG tube. EXAM: PORTABLE ABDOMEN - 1 VIEW COMPARISON:  None. FINDINGS: Tip and side port of the enteric tube are below the diaphragm in the stomach. Nonobstructive bowel gas pattern in the upper abdomen. Dialysis catheter tip at the atrial caval junction and central line tip in the SVC or partially included IMPRESSION: Tip and side port of the enteric tube below the diaphragm in the stomach. Electronically Signed   By: Keith Rake M.D.   On: 05/29/2021 20:51   EEG adult  Result Date: 05/28/2021 Lora Havens, MD     05/28/2021  1:20 PM Patient Name: Jonathan Kane MRN: [id number] Epilepsy Attending: Lora Havens Referring Physician/Provider: Anibal Henderson, NP Date: 05/28/2021 Duration: 22.59 mins Patient history:  74 y.o. male who was found down in the bathroom at his dialysis clinic, confused who had new onset status epilepticus in the setting of severe hyperglycemia en route to the ED. EEG to evaluate for seizure Level of alertness: Comatose AEDs during EEG study: LEV, PHT, propofol, ativan  Technical aspects: This EEG study was done with scalp electrodes positioned according to the 10-20 International system of electrode placement. Electrical activity was acquired at a sampling rate of '500Hz'$  and reviewed with a high frequency filter of '70Hz'$  and a low frequency filter of '1Hz'$ . EEG data were recorded continuously and digitally stored. Description: EEG showed continuous generalized and lateralized right hemisphere 3 to 6 Hz theta-delta slowing admixed with 15 to 18 Hz beta activity.  There is also sharply contoured 2 to 3 Hz delta slowing in bilateral, left > right frontal region.  Physiologic photic driving was not seen during photic stimulation.  Hyperventilation was not performed.   ABNORMALITY -Continuous slow, generalized and lateralized right hemisphere -Continuous rhythmic, left > right frontal region IMPRESSION: This study showed sharply contoured rhythmic slowing in bilateral left m>right frontal region.  This EEG pattern is on the ictal-interictal continuum with low potential for seizures. There is also cortical dysfunction in right hemisphere likely secondary underlying structural abnormality, postictal state.  There is also moderate to severe diffuse encephalopathy, nonspecific etiology but likely related to sedation.  No seizures or definite epileptiform discharges  were seen throughout the recording. Priyanka [name]   Overnight EEG with video  Result Date: 05/29/2021 Lora Havens, MD     05/30/2021  8:43 AM Patient Name: MARQUAVIUS VANVOOREN MRN: [id number] Epilepsy Attending: Lora Havens Referring Physician/Provider: Anibal Henderson, NP Duration: 05/28/2021 1311 to  05/29/2021 1300  Patient history:  75 y.o. male who was found down in the bathroom at his dialysis clinic, confused who had new onset status epilepticus in the setting of severe hyperglycemia en route to the ED. EEG to evaluate for seizure  Level of alertness: Comatose  AEDs during EEG study:  propofol, LEV  Technical  aspects: This EEG study was done with scalp electrodes positioned according to the 10-20 International system of electrode placement. Electrical activity was acquired at a sampling rate of '500Hz'$  and reviewed with a high frequency filter of '70Hz'$  and a low frequency filter of '1Hz'$ . EEG data were recorded continuously and digitally stored.  Description: EEG initially showed continuous generalized and lateralized right hemisphere 3 to 6 Hz theta-delta slowing admixed with 15 to 18 Hz beta activity.  There was also sharply contoured 2 to 3 Hz delta slowing in bilateral, left > right frontal region. On 05/28/2021 after around 1500, propofol was started and thereafter EEG showed burst suppression pattern with generalized 2-'3hz'$  delta slowing admixed with 15-'18hz'$  beta activity lasting 2-3 seconds alternating with generalized eeg suppression lasting 3-4 seconds. On 05/29/2021 after around 9am, eeg gradually transitioned to continuous generalized polymorphic sharply contoured 3-'6hz'$  theta-delta slowing. Parts of study between 05/29/2021 1115 to 1230 were difficult to review due to significant electrode artifact  ABNORMALITY -Continuous slow, generalized and lateralized right hemisphere  IMPRESSION: This study is suggestive of cortical dysfunction in right hemisphere likely secondary underlying structural abnormality, postictal state.  There is also moderate to severe diffuse encephalopathy, nonspecific etiology but likely related to sedation.  No seizures or definite epileptiform discharges were seen throughout the recording.  Lora Havens   CT HEAD CODE STROKE WO CONTRAST  Result Date: 05/28/2021 CLINICAL DATA:  Code stroke.  Neuro deficit, acute, stroke suspected EXAM: CT HEAD WITHOUT CONTRAST TECHNIQUE: Contiguous axial images were obtained from the base of the skull through the vertex without intravenous contrast. COMPARISON:  05/22/2021 FINDINGS: There is significant motion artifact. Brain: No acute intracranial  hemorrhage or mass effect. No definite new loss of gray-white differentiation. Areas of encephalomalacia are again identified in bilateral anterior inferior frontal and left temporal lobes. Additional patchy and confluent areas hypoattenuation in the supratentorial white matter probably reflects stable chronic microvascular ischemic changes. Prominence of the ventricles and sulci reflects stable parenchymal volume loss. No extra-axial collection. Vascular: Hyperdense vessel. Skull: Unremarkable. Sinuses/Orbits: No acute abnormality. Other: None ASPECTS ([place] Stroke Program Early CT Score) - Ganglionic level infarction (caudate, lentiform nuclei, internal capsule, insula, M1-M3 cortex): 7 - Supraganglionic infarction (M4-M6 cortex): 3 Total score (0-10 with 10 being normal): 10 IMPRESSION: Significantly motion degraded. There is no acute intracranial hemorrhage or definite evidence of acute infarction. ASPECT score is 10. These results were communicated to Dr. Rory Percy at 12:16 pm on 05/28/2021 by text page via the [facility] messaging system. Electronically Signed   By: Macy Mis M.D.   On: 05/28/2021 12:19     Labs:   Basic Metabolic Panel: Recent Labs  Lab 06/02/21 0041 06/03/21 0502 06/08/21 0154  NA 135 134* 134*  K 4.5 3.9 4.1  CL 98 97* 98  CO2 22 23 19*  GLUCOSE 157* 99 153*  BUN 56* 46* 111*  CREATININE 7.61* 6.26* 11.75*  CALCIUM 8.3* 9.0 8.6*  MG 2.2  --   --   PHOS  --   --  6.2*   GFR Estimated Creatinine Clearance: 4.8 mL/min (A) (by C-G formula based on SCr of 11.75 mg/dL (H)). Liver Function Tests: Recent Labs  Lab 06/02/21 0041 06/08/21 0154  AST 74*  --   ALT 63*  --   ALKPHOS 90  --   BILITOT 0.8  --   PROT 6.7  --   ALBUMIN 2.6* 2.5*   No results for input(s): LIPASE, AMYLASE in the last 168 hours. No results for input(s): AMMONIA in the last 168 hours. Coagulation profile No results for input(s): INR, PROTIME in the last 168 hours.  CBC: Recent Labs  Lab  06/02/21 0041 06/03/21 0502 06/08/21 0154  WBC 13.3* 12.8* 10.6*  NEUTROABS 10.4* 9.7*  --   HGB 11.9* 10.6* 9.1*  HCT 34.6* 31.3* 26.9*  MCV 92.5 91.0 92.1  PLT 157 177 361   Cardiac Enzymes: No results for input(s): CKTOTAL, CKMB, CKMBINDEX, TROPONINI in the last 168 hours. BNP: Invalid input(s): POCBNP CBG: Recent Labs  Lab 06/07/21 1210 06/07/21 1606 06/07/21 2131 06/08/21 0610 06/08/21 1149  GLUCAP 252* 135* 210* 103* 248*   D-Dimer No results for input(s): DDIMER in the last 72 hours. Hgb A1c No results for input(s): HGBA1C in the last 72 hours. Lipid Profile No results for input(s): CHOL, HDL, LDLCALC, TRIG, CHOLHDL, LDLDIRECT in the last 72 hours. Thyroid function studies No results for input(s): TSH, T4TOTAL, T3FREE, THYROIDAB in the last 72 hours.  Invalid input(s): FREET3 Anemia work up No results for input(s): VITAMINB12, FOLATE, FERRITIN, TIBC, IRON, RETICCTPCT in the last 72 hours. Microbiology Recent Results (from the past 240 hour(s))  Culture, blood (x 2)     Status: Abnormal   Collection Time: 06/01/21 11:29 AM   Specimen: BLOOD RIGHT ARM  Result Value Ref Range Status   Specimen Description BLOOD RIGHT ARM  Final   Special Requests   Final    BOTTLES DRAWN AEROBIC AND ANAEROBIC Blood Culture adequate volume   Culture  Setup Time   Final    GRAM POSITIVE COCCI IN CLUSTERS AEROBIC BOTTLE ONLY CRITICAL RESULT CALLED TO, READ BACK BY AND VERIFIED WITH: pharmd Seward Meth 06/02/2021 '@1056'$  by Blima Rich Performed at [facility], [place] [address] [place], [place] [address]    Culture METHICILLIN RESISTANT STAPHYLOCOCCUS AUREUS (A)  Final   Report Status 06/04/2021 FINAL  Final   Organism ID, Bacteria METHICILLIN RESISTANT STAPHYLOCOCCUS AUREUS  Final      Susceptibility   Methicillin resistant staphylococcus aureus - MIC*    CIPROFLOXACIN >=8 RESISTANT Resistant     ERYTHROMYCIN >=8 RESISTANT Resistant     GENTAMICIN <=0.5 SENSITIVE Sensitive      OXACILLIN >=4 RESISTANT Resistant     TETRACYCLINE <=1 SENSITIVE Sensitive     VANCOMYCIN <=0.5 SENSITIVE Sensitive     TRIMETH/SULFA <=10 SENSITIVE Sensitive     CLINDAMYCIN <=0.25 SENSITIVE Sensitive     RIFAMPIN <=0.5 SENSITIVE Sensitive     Inducible Clindamycin NEGATIVE Sensitive     * METHICILLIN RESISTANT STAPHYLOCOCCUS AUREUS  Blood Culture ID Panel (Reflexed)     Status: Abnormal   Collection Time: 06/01/21 11:29 AM  Result Value Ref Range Status   Enterococcus faecalis NOT DETECTED NOT DETECTED Final   Enterococcus Faecium NOT DETECTED NOT DETECTED Final   Listeria monocytogenes NOT DETECTED NOT DETECTED Final  Staphylococcus species DETECTED (A) NOT DETECTED Final    Comment: CRITICAL RESULT CALLED TO, READ BACK BY AND VERIFIED WITH: pharmd Seward Meth 06/02/2021 '@1056'$  by Blima Rich    Staphylococcus aureus (BCID) DETECTED (A) NOT DETECTED Final    Comment: Methicillin (oxacillin)-resistant Staphylococcus aureus (MRSA). MRSA is predictably resistant to beta-lactam antibiotics (except ceftaroline). Preferred therapy is vancomycin unless clinically contraindicated. Patient requires contact precautions if  hospitalized. CRITICAL RESULT CALLED TO, READ BACK BY AND VERIFIED WITH: pharmd Seward Meth 06/02/2021 '@1056'$  by Blima Rich    Staphylococcus epidermidis NOT DETECTED NOT DETECTED Final   Staphylococcus lugdunensis NOT DETECTED NOT DETECTED Final   Streptococcus species NOT DETECTED NOT DETECTED Final   Streptococcus agalactiae NOT DETECTED NOT DETECTED Final   Streptococcus pneumoniae NOT DETECTED NOT DETECTED Final   Streptococcus pyogenes NOT DETECTED NOT DETECTED Final   A.calcoaceticus-baumannii NOT DETECTED NOT DETECTED Final   Bacteroides fragilis NOT DETECTED NOT DETECTED Final   Enterobacterales NOT DETECTED NOT DETECTED Final   Enterobacter cloacae complex NOT DETECTED NOT DETECTED Final   Escherichia coli NOT DETECTED NOT DETECTED Final   Klebsiella aerogenes NOT DETECTED NOT DETECTED  Final   Klebsiella oxytoca NOT DETECTED NOT DETECTED Final   Klebsiella pneumoniae NOT DETECTED NOT DETECTED Final   Proteus species NOT DETECTED NOT DETECTED Final   Salmonella species NOT DETECTED NOT DETECTED Final   Serratia marcescens NOT DETECTED NOT DETECTED Final   Haemophilus influenzae NOT DETECTED NOT DETECTED Final   Neisseria meningitidis NOT DETECTED NOT DETECTED Final   Pseudomonas aeruginosa NOT DETECTED NOT DETECTED Final   Stenotrophomonas maltophilia NOT DETECTED NOT DETECTED Final   Candida albicans NOT DETECTED NOT DETECTED Final   Candida auris NOT DETECTED NOT DETECTED Final   Candida glabrata NOT DETECTED NOT DETECTED Final   Candida krusei NOT DETECTED NOT DETECTED Final   Candida parapsilosis NOT DETECTED NOT DETECTED Final   Candida tropicalis NOT DETECTED NOT DETECTED Final   Cryptococcus neoformans/gattii NOT DETECTED NOT DETECTED Final   Meth resistant mecA/C and MREJ DETECTED (A) NOT DETECTED Final    Comment: CRITICAL RESULT CALLED TO, READ BACK BY AND VERIFIED WITH: pharmd Seward Meth 06/02/2021 '@1056'$  by Blima Rich Performed at [facility], [address] [place], [place] [address]   Culture, blood (x 2)     Status: None   Collection Time: 06/01/21 11:47 AM   Specimen: BLOOD  Result Value Ref Range Status   Specimen Description BLOOD BLOOD RIGHT HAND  Final   Special Requests   Final    BOTTLES DRAWN AEROBIC AND ANAEROBIC Blood Culture results may not be optimal due to an inadequate volume of blood received in culture bottles   Culture   Final    NO GROWTH 5 DAYS Performed at [facility], [place] [address], [place], [place] [address]    Report Status 06/06/2021 FINAL  Final  Culture, blood (Routine X 2) w Reflex to ID Panel     Status: None   Collection Time: 06/03/21  5:03 AM   Specimen: BLOOD RIGHT ARM  Result Value Ref Range Status   Specimen Description BLOOD RIGHT ARM  Final   Special Requests   Final    BOTTLES DRAWN AEROBIC AND ANAEROBIC  Blood Culture adequate volume   Culture   Final    NO GROWTH 5 DAYS Performed at [facility], [place] [address], [place], [place] [address]    Report Status 06/08/2021 FINAL  Final  Culture, blood (Routine X 2) w Reflex  to ID Panel     Status: None   Collection Time: 06/03/21  5:15 AM   Specimen: BLOOD RIGHT HAND  Result Value Ref Range Status   Specimen Description BLOOD RIGHT HAND  Final   Special Requests   Final    BOTTLES DRAWN AEROBIC AND ANAEROBIC Blood Culture adequate volume   Culture   Final    NO GROWTH 5 DAYS Performed at [facility], [address] [place], [place] [address]    Report Status 06/08/2021 FINAL  Final     Discharge Instructions:   Discharge Instructions     Diet - low sodium heart healthy   Complete by: As directed    Increase activity slowly   Complete by: As directed    No wound care   Complete by: As directed       Allergies as of 06/08/2021       Reactions   Dulaglutide    TRULICITY:  GI Upset intolerance        Medication List     STOP taking these medications    ciprofloxacin 500 MG tablet Commonly known as: CIPRO   cyclobenzaprine 10 MG tablet Commonly known as: FLEXERIL   diclofenac Sodium 1 % Gel Commonly known as: VOLTAREN   glipiZIDE 10 MG tablet Commonly known as: GLUCOTROL   insulin aspart 100 UNIT/ML FlexPen Commonly known as: NOVOLOG   lidocaine-prilocaine cream Commonly known as: EMLA   lisinopril 5 MG tablet Commonly known as: ZESTRIL   Naftifine HCl 2 % Gel       TAKE these medications    Accu-Chek Aviva Plus test strip Generic drug: glucose blood 1 each by Other route as needed.   Accu-Chek Softclix Lancets lancets CHECK BLOOD SUGAR TWO TO THREE TIMES DAILY   acetaminophen 325 MG tablet Commonly known as: TYLENOL Take 2 tablets (650 mg total) by mouth every 6 (six) hours as needed for fever, mild pain or headache.   amLODipine 10 MG tablet Commonly known as: NORVASC Take  10 mg by mouth daily.   aspirin EC 81 MG tablet Take 81 mg by mouth daily.   atorvastatin 40 MG tablet Commonly known as: LIPITOR Take 40 mg by mouth at bedtime.   B-D SINGLE USE SWABS REGULAR Pads USE TO CHECK BLOOD SUGAR AS DIRECTED   calcitRIOL 0.25 MCG capsule Commonly known as: ROCALTROL Take 0.25 mcg by mouth daily.   cholecalciferol 25 MCG (1000 UNIT) tablet Commonly known as: VITAMIN D3 Take 1,000 Units by mouth daily.   diltiazem 180 MG 24 hr capsule Commonly known as: TIAZAC Take 180 mg by mouth daily.   GlucoCom Blood Glucose Monitor Devi Use as directed.  Dx Code: E11.621   hydrALAZINE 25 MG tablet Commonly known as: APRESOLINE Take 1 tablet (25 mg total) by mouth every 8 (eight) hours. What changed: when to take this   Insulin Pen Needle 31G X 8 MM Misc Use as directed per sliding scale.  Dx Code: E11.621   latanoprost 0.005 % ophthalmic solution Commonly known as: XALATAN Place 1 drop into both eyes at bedtime.   sevelamer carbonate 800 MG tablet Commonly known as: RENVELA Take 800-2,400 mg by mouth See admin instructions. Take 3 tablets (2400 mg) by mouth with meals and 1 tablet (800 mg) with snacks   sildenafil 20 MG tablet Commonly known as: REVATIO Take 40-60 mg by mouth daily as needed (ED).   sodium bicarbonate 650 MG tablet Take 1 tablet (650 mg total)  by mouth 2 (two) times daily. What changed: when to take this   Tradjenta 5 MG Tabs tablet Generic drug: linagliptin Take 5 mg by mouth daily.   Tyler Aas FlexTouch 100 UNIT/ML FlexTouch Pen Generic drug: insulin degludec Inject 22 Units into the skin daily. What changed: how much to take   Vancomycin 750-5 MG/150ML-% Soln Commonly known as: VANCOCIN Inject 150 mLs (750 mg total) into the vein every Monday, Wednesday, and Friday with hemodialysis. Start taking on: June 09, 2021          Time coordinating discharge: 34 minutes  Signed:  Jennye Boroughs  [facility] 06/08/2021, 4:00 PM   Pager on [email or web address]. If 7PM-7AM, please contact night-coverage at [email or web address]

## 2021-06-08 NOTE — Progress Notes (Signed)
Inpatient Diabetes Program Recommendations  AACE/ADA: New Consensus Statement on Inpatient Glycemic Control (2015)  Target Ranges:  Prepandial:   less than 140 mg/dL      Peak postprandial:   less than 180 mg/dL (1-2 hours)      Critically ill patients:  140 - 180 mg/dL   Lab Results  Component Value Date   GLUCAP 248 (H) 06/08/2021   HGBA1C 11.5 (H) 06/01/2021    Review of Glycemic Control Results for TASHEEM, CONDRON (MRN [id number]) as of 06/08/2021 13:21  Ref. Range 06/07/2021 16:06 06/07/2021 21:31 06/08/2021 06:10 06/08/2021 11:49  Glucose-Capillary Latest Ref Range: 70 - 99 mg/dL 135 (H) 210 (H) 103 (H) 248 (H)    Diabetes history: Type 2 DM Outpatient Diabetes medications: Tresiba 32 units QD, Tradjenta 5 mg QD, Glipizide 10 mg QD, Novolog 0-7 units TID Current orders for Inpatient glycemic control: Semglee 22 units QD, Novolog 0-6 units TID   Inpatient Diabetes Program Recommendations:     If post prandials continue to exceed inpatient goals of 180 mg/dL, consider adding Novolog 2 units TID (assuming patient is consuming >50% of meals).   Thanks, Bronson Curb, MSN, RNC-OB Diabetes Coordinator [phone] (8a-5p)

## 2021-06-08 NOTE — Progress Notes (Signed)
Called to give report to Mylo, no answer, will inform night RN

## 2021-06-08 NOTE — Plan of Care (Signed)
Went to HD at [facility] and continues to be in HD at 23. Pt unhappy about going to HD so late but it was explained they had a lot of clients to do and still had some to do. No c/o pain this shift while he was up on the unit.

## 2021-06-08 NOTE — Progress Notes (Signed)
Returned from HD per bed in stable condition. Pt back in rm. Voiced no complaints

## 2021-06-08 NOTE — Progress Notes (Signed)
Progress Note    Jonathan Kane  [id number] DOB: 07/08/1946  DOA: 05/28/2021 PCP: Cipriano Mile, NP      Brief Narrative:    Medical records reviewed and are as summarized below:  Jonathan Kane is a 75 y.o. male with medical history significant for type II DM, ESRD on hemodialysis, hypertension, substance use disorder, alcohol use disorder, hepatitis C infection, HSV infection, BPH.  He was brought to the hospital because of unresponsiveness and was found to have severe hyperglycemia and hypertensive emergency.  He developed new onset seizures with status epilepticus.  He was intubated and placed on mechanical ventilation.   Neurologist was consulted to assist with management.  He was started on Keppra.  Keppra was later discontinued since seizure was provoked  Work-up also revealed MRSA sepsis secondary to MRSA bacteremia.  He was treated with IV antibiotics.  There was no evidence of vegetation or cardiac thrombus on TEE.  ID was consulted to assist with management.  He has uncontrolled diabetes and he had episodes of hypoglycemia and hyperglycemia in the hospital.  Hemoglobin A1c is 11.5.  Insulin therapy was adjusted accordingly.  He was evaluated by PT and OT and further rehabilitation was recommended.    Assessment/Plan:   Principal Problem:   MRSA bacteremia Active Problems:   DKA (diabetic ketoacidosis) ([place])   Status epilepticus ([place])   Malnutrition of moderate degree   Nutrition Problem: Moderate Malnutrition Etiology: chronic illness (ESRD on HD, DM)  Signs/Symptoms: mild fat depletion, moderate fat depletion, mild muscle depletion, moderate muscle depletion   Body mass index is 19.33 kg/m.   Acute metabolic encephalopathy, possible subacute stroke in both cerebral hemispheres: Continue aspirin and Lipitor.  Outpatient follow-up for 30-day event monitor.  Headache: Improved  Hypertension with recent hypertensive emergency: Continue  antihypertensives.  2D echo showed EF estimated at 60 to 65%, severe LVH, grade 1 diastolic dysfunction  New seizures with status epilepticus: Keppra has been discontinued by the neurologist since seizure was provoked. Video EEG did not show any seizures or definitive epileptiform discharges.  There were changes suggestive of cortical dysfunction in the right hemisphere.  MRSA sepsis secondary to MRSA bacteremia:  1 out of 4 culture bottles positive for MRSA.  No growth on repeat blood culture. Dialysis permacath was removed from the right upper chest on 06/03/2021. No evidence of atrial thrombus or valvular vegetations on TEE. Continue IV vancomycin to be given with hemodialysis through 07/15/2021 to complete a 6-week course of treatment.    ESRD: Follow-up with nephrologist for hemodialysis.  Brittle IDDM with hyperglycemia and hypoglycemia: Hemoglobin A1c is 11.5.  Continue insulin glargine and NovoLog.  Monitor glucose levels closely.    Acute hypoxic respiratory failure: S/p extubation on 05/31/2021.  He is tolerating room air.  Awaiting placement to SNF.  Case discussed with Benjamine Mola, Education officer, museum.   Diet Order             Diet renal/carb modified with fluid restriction Diet-HS Snack? Nothing; Fluid restriction: 1200 mL Fluid; Room service appropriate? Yes; Fluid consistency: Thin  Diet effective now                      Consultants: Neurologist Nephrologist Intensivist  Procedures: Video EEG TEE on 06/04/2021    Medications:    amLODipine  5 mg Oral Daily   aspirin  81 mg Oral Daily   atorvastatin  40 mg Oral QHS   cholecalciferol  1,000 Units Oral  Daily   diltiazem  180 mg Oral Daily   feeding supplement (NEPRO CARB STEADY)  237 mL Oral BID BM   heparin injection (subcutaneous)  5,000 Units Subcutaneous Q8H   hydrALAZINE  25 mg Oral Q8H   insulin aspart  0-6 Units Subcutaneous TID WC   insulin glargine-yfgn  22 Units Subcutaneous Daily   latanoprost  1  drop Both Eyes QHS   multivitamin  1 tablet Oral QHS   Continuous Infusions:  sodium chloride Stopped (05/31/21 1252)   vancomycin Stopped (06/08/21 0537)     Anti-infectives (From admission, onward)    Start     Dose/Rate Route Frequency Ordered Stop   06/04/21 2100  vancomycin (VANCOCIN) IVPB 750 mg/150 ml premix        750 mg 150 mL/hr over 60 Minutes Intravenous  Once 06/04/21 2007 06/04/21 2228   06/02/21 1200  vancomycin (VANCOCIN) IVPB 750 mg/150 ml premix        750 mg 150 mL/hr over 60 Minutes Intravenous Every M-W-F (Hemodialysis) 06/01/21 1144     06/01/21 1230  piperacillin-tazobactam (ZOSYN) IVPB 2.25 g  Status:  Discontinued        2.25 g 100 mL/hr over 30 Minutes Intravenous Every 8 hours 06/01/21 1134 06/02/21 1250   06/01/21 1200  metroNIDAZOLE (FLAGYL) IVPB 500 mg  Status:  Discontinued        500 mg 100 mL/hr over 60 Minutes Intravenous Every 8 hours 06/01/21 1103 06/01/21 1125   06/01/21 1200  vancomycin (VANCOREADY) IVPB 1500 mg/300 mL        1,500 mg 150 mL/hr over 120 Minutes Intravenous  Once 06/01/21 1103 06/01/21 1549   06/01/21 1200  piperacillin-tazobactam (ZOSYN) IVPB 2.25 g  Status:  Discontinued        2.25 g 100 mL/hr over 30 Minutes Intravenous Every 8 hours 06/01/21 1125 06/01/21 1134              Family Communication/Anticipated D/C date and plan/Code Status   DVT prophylaxis: heparin injection 5,000 Units Start: 05/28/21 2200     Code Status: Full Code  Family Communication: None Disposition Plan:    Status is: Inpatient  Remains inpatient appropriate because:IV treatments appropriate due to intensity of illness or inability to take PO and Inpatient level of care appropriate due to severity of illness  Dispo: The patient is from: Home              Anticipated d/c is to: SNF              Patient currently is medically stable to d/c.   Difficult to place patient No           Subjective:   Interval events noted.   He has no complaints.  He asked about discharge plans.  Objective:    Vitals:   06/08/21 0435 06/08/21 0440 06/08/21 0742 06/08/21 1146  BP: (!) 147/74 (!) 154/79 (!) 153/81 (!) 150/75  Pulse: 87 84 93 97  Resp: '16 16 20 18  '$ Temp:  98.9 F (37.2 C) 98.4 F (36.9 C) 98.4 F (36.9 C)  TempSrc:  Oral Oral Oral  SpO2: 98% 98%  97%  Weight:  61.1 kg    Height:       No data found.   Intake/Output Summary (Last 24 hours) at 06/08/2021 1216 Last data filed at 06/08/2021 0440 Gross per 24 hour  Intake --  Output 900 ml  Net -900 ml   [employer]  06/04/21 1212 06/08/21 0100 06/08/21 0440  Weight: 61.5 kg 62 kg 61.1 kg    Exam:  GEN: NAD SKIN: No rash EYES: No pallor or icterus ENT: MMM CV: RRR PULM: CTA B ABD: soft, ND, NT, +BS CNS: AAO x 3, non focal EXT: No edema or tenderness          Data Reviewed:   I have personally reviewed following labs and imaging studies:  Labs: Labs show the following:   Basic Metabolic Panel: Recent Labs  Lab 06/02/21 0041 06/03/21 0502 06/08/21 0154  NA 135 134* 134*  K 4.5 3.9 4.1  CL 98 97* 98  CO2 22 23 19*  GLUCOSE 157* 99 153*  BUN 56* 46* 111*  CREATININE 7.61* 6.26* 11.75*  CALCIUM 8.3* 9.0 8.6*  MG 2.2  --   --   PHOS  --   --  6.2*   GFR Estimated Creatinine Clearance: 4.8 mL/min (A) (by C-G formula based on SCr of 11.75 mg/dL (H)). Liver Function Tests: Recent Labs  Lab 06/02/21 0041 06/08/21 0154  AST 74*  --   ALT 63*  --   ALKPHOS 90  --   BILITOT 0.8  --   PROT 6.7  --   ALBUMIN 2.6* 2.5*   No results for input(s): LIPASE, AMYLASE in the last 168 hours. No results for input(s): AMMONIA in the last 168 hours. Coagulation profile No results for input(s): INR, PROTIME in the last 168 hours.   CBC: Recent Labs  Lab 06/02/21 0041 06/03/21 0502 06/08/21 0154  WBC 13.3* 12.8* 10.6*  NEUTROABS 10.4* 9.7*  --   HGB 11.9* 10.6* 9.1*  HCT 34.6* 31.3* 26.9*  MCV 92.5 91.0 92.1  PLT  157 177 361   Cardiac Enzymes: No results for input(s): CKTOTAL, CKMB, CKMBINDEX, TROPONINI in the last 168 hours. BNP (last 3 results) No results for input(s): PROBNP in the last 8760 hours. CBG: Recent Labs  Lab 06/07/21 1210 06/07/21 1606 06/07/21 2131 06/08/21 0610 06/08/21 1149  GLUCAP 252* 135* 210* 103* 248*   D-Dimer: No results for input(s): DDIMER in the last 72 hours. Hgb A1c: No results for input(s): HGBA1C in the last 72 hours.  Lipid Profile: No results for input(s): CHOL, HDL, LDLCALC, TRIG, CHOLHDL, LDLDIRECT in the last 72 hours.  Thyroid function studies: No results for input(s): TSH, T4TOTAL, T3FREE, THYROIDAB in the last 72 hours.  Invalid input(s): FREET3 Anemia work up: No results for input(s): VITAMINB12, FOLATE, FERRITIN, TIBC, IRON, RETICCTPCT in the last 72 hours. Sepsis Labs: Recent Labs  Lab 06/01/21 1402 06/02/21 0041 06/03/21 0502 06/08/21 0154  PROCALCITON  --  5.92 6.13  --   WBC  --  13.3* 12.8* 10.6*  LATICACIDVEN 1.0  --   --   --     Microbiology Recent Results (from the past 240 hour(s))  Culture, blood (x 2)     Status: Abnormal   Collection Time: 06/01/21 11:29 AM   Specimen: BLOOD RIGHT ARM  Result Value Ref Range Status   Specimen Description BLOOD RIGHT ARM  Final   Special Requests   Final    BOTTLES DRAWN AEROBIC AND ANAEROBIC Blood Culture adequate volume   Culture  Setup Time   Final    GRAM POSITIVE COCCI IN CLUSTERS AEROBIC BOTTLE ONLY CRITICAL RESULT CALLED TO, READ BACK BY AND VERIFIED WITH: pharmd Seward Meth 06/02/2021 '@1056'$  by Blima Rich Performed at [facility], [place] [address], [place], [place] [address]    Culture  METHICILLIN RESISTANT STAPHYLOCOCCUS AUREUS (A)  Final   Report Status 06/04/2021 FINAL  Final   Organism ID, Bacteria METHICILLIN RESISTANT STAPHYLOCOCCUS AUREUS  Final      Susceptibility   Methicillin resistant staphylococcus aureus - MIC*    CIPROFLOXACIN >=8 RESISTANT Resistant      ERYTHROMYCIN >=8 RESISTANT Resistant     GENTAMICIN <=0.5 SENSITIVE Sensitive     OXACILLIN >=4 RESISTANT Resistant     TETRACYCLINE <=1 SENSITIVE Sensitive     VANCOMYCIN <=0.5 SENSITIVE Sensitive     TRIMETH/SULFA <=10 SENSITIVE Sensitive     CLINDAMYCIN <=0.25 SENSITIVE Sensitive     RIFAMPIN <=0.5 SENSITIVE Sensitive     Inducible Clindamycin NEGATIVE Sensitive     * METHICILLIN RESISTANT STAPHYLOCOCCUS AUREUS  Blood Culture ID Panel (Reflexed)     Status: Abnormal   Collection Time: 06/01/21 11:29 AM  Result Value Ref Range Status   Enterococcus faecalis NOT DETECTED NOT DETECTED Final   Enterococcus Faecium NOT DETECTED NOT DETECTED Final   Listeria monocytogenes NOT DETECTED NOT DETECTED Final   Staphylococcus species DETECTED (A) NOT DETECTED Final    Comment: CRITICAL RESULT CALLED TO, READ BACK BY AND VERIFIED WITH: pharmd Seward Meth 06/02/2021 '@1056'$  by JW    Staphylococcus aureus (BCID) DETECTED (A) NOT DETECTED Final    Comment: Methicillin (oxacillin)-resistant Staphylococcus aureus (MRSA). MRSA is predictably resistant to beta-lactam antibiotics (except ceftaroline). Preferred therapy is vancomycin unless clinically contraindicated. Patient requires contact precautions if  hospitalized. CRITICAL RESULT CALLED TO, READ BACK BY AND VERIFIED WITH: pharmd Seward Meth 06/02/2021 '@1056'$  by Blima Rich    Staphylococcus epidermidis NOT DETECTED NOT DETECTED Final   Staphylococcus lugdunensis NOT DETECTED NOT DETECTED Final   Streptococcus species NOT DETECTED NOT DETECTED Final   Streptococcus agalactiae NOT DETECTED NOT DETECTED Final   Streptococcus pneumoniae NOT DETECTED NOT DETECTED Final   Streptococcus pyogenes NOT DETECTED NOT DETECTED Final   A.calcoaceticus-baumannii NOT DETECTED NOT DETECTED Final   Bacteroides fragilis NOT DETECTED NOT DETECTED Final   Enterobacterales NOT DETECTED NOT DETECTED Final   Enterobacter cloacae complex NOT DETECTED NOT DETECTED Final   Escherichia coli  NOT DETECTED NOT DETECTED Final   Klebsiella aerogenes NOT DETECTED NOT DETECTED Final   Klebsiella oxytoca NOT DETECTED NOT DETECTED Final   Klebsiella pneumoniae NOT DETECTED NOT DETECTED Final   Proteus species NOT DETECTED NOT DETECTED Final   Salmonella species NOT DETECTED NOT DETECTED Final   Serratia marcescens NOT DETECTED NOT DETECTED Final   Haemophilus influenzae NOT DETECTED NOT DETECTED Final   Neisseria meningitidis NOT DETECTED NOT DETECTED Final   Pseudomonas aeruginosa NOT DETECTED NOT DETECTED Final   Stenotrophomonas maltophilia NOT DETECTED NOT DETECTED Final   Candida albicans NOT DETECTED NOT DETECTED Final   Candida auris NOT DETECTED NOT DETECTED Final   Candida glabrata NOT DETECTED NOT DETECTED Final   Candida krusei NOT DETECTED NOT DETECTED Final   Candida parapsilosis NOT DETECTED NOT DETECTED Final   Candida tropicalis NOT DETECTED NOT DETECTED Final   Cryptococcus neoformans/gattii NOT DETECTED NOT DETECTED Final   Meth resistant mecA/C and MREJ DETECTED (A) NOT DETECTED Final    Comment: CRITICAL RESULT CALLED TO, READ BACK BY AND VERIFIED WITH: pharmd Seward Meth 06/02/2021 '@1056'$  by Blima Rich Performed at [facility], [address], [place], [place] [address]   Culture, blood (x 2)     Status: None   Collection Time: 06/01/21 11:47 AM   Specimen: BLOOD  Result Value Ref Range Status   Specimen Description  BLOOD BLOOD RIGHT HAND  Final   Special Requests   Final    BOTTLES DRAWN AEROBIC AND ANAEROBIC Blood Culture results may not be optimal due to an inadequate volume of blood received in culture bottles   Culture   Final    NO GROWTH 5 DAYS Performed at [facility], [place] [address] [place], [place] [address]    Report Status 06/06/2021 FINAL  Final  Culture, blood (Routine X 2) w Reflex to ID Panel     Status: None   Collection Time: 06/03/21  5:03 AM   Specimen: BLOOD RIGHT ARM  Result Value Ref Range Status   Specimen Description BLOOD  RIGHT ARM  Final   Special Requests   Final    BOTTLES DRAWN AEROBIC AND ANAEROBIC Blood Culture adequate volume   Culture   Final    NO GROWTH 5 DAYS Performed at [facility], [place] [address], [place], [place] [address]    Report Status 06/08/2021 FINAL  Final  Culture, blood (Routine X 2) w Reflex to ID Panel     Status: None   Collection Time: 06/03/21  5:15 AM   Specimen: BLOOD RIGHT HAND  Result Value Ref Range Status   Specimen Description BLOOD RIGHT HAND  Final   Special Requests   Final    BOTTLES DRAWN AEROBIC AND ANAEROBIC Blood Culture adequate volume   Culture   Final    NO GROWTH 5 DAYS Performed at [facility], [place] [address], [place], [place] [address]    Report Status 06/08/2021 FINAL  Final    Procedures and diagnostic studies:  No results found.             LOS: 11 days   [place] Copywriter, advertising on [email or web address]. If 7PM-7AM, please contact night-coverage at [email or web address]     06/08/2021, 12:16 PM

## 2021-06-08 NOTE — Progress Notes (Addendum)
Nutrition Follow-up  DOCUMENTATION CODES:  Non-severe (moderate) malnutrition in context of chronic illness  INTERVENTION:  -Continue Renal MVI daily -Continue Nepro Shake po BID, each supplement provides 425 kcal and 19 grams protein  -Continue Magic cup TID with meals, each supplement provides 290 kcal and 9 grams of protein  -Continue feeding assistance with meals  NUTRITION DIAGNOSIS:  Moderate Malnutrition related to chronic illness (ESRD on HD, DM) as evidenced by mild fat depletion, moderate fat depletion, mild muscle depletion, moderate muscle depletion. -- Ongoing  GOAL:  Patient will meet greater than or equal to 90% of their needs -- Progressing   MONITOR:  PO intake, Supplement acceptance, Diet advancement, Labs, Weight trends, Skin, I & O's  REASON FOR ASSESSMENT:  Consult Enteral/tube feeding initiation and management  ASSESSMENT:  75 y.o. male with PMHx of ESRD on hemodialysis, DM2, and HTN who was found down with AMS. Pt presents in DKA and abnormal posturing of the whole body concerning for generalized tonic-clonic seizure. Initially given Ativan and Keppra for seizure control and intubated for airway protection.  8/15- extubated 8/16- s/p BSE- advanced to dysphagia 2 diet with thin liquids 8/18- removal of R IJ tunneled HD catheter; diet advanced to regular texture 8/19- s/p TEE with no evidence of vegetation or cardiac thrombus  Pt awaiting SNF placement. Per RN, pt doing well with meals and supplements. Continue current nutrition plan of care.   PO Intake: 50-100% x last 7 recorded meals (84% average meal intake)   No UOP documented x24 hours I/O: -178m since admit  Last HD 8/23, net UF 9011mPre-HD weight 62 kg Post-HD weight 61.1 kg Pt is below EDW of 70.5 kg, will need new EDW if weights are accurate.   Medications: Scheduled Meds:  amLODipine  5 mg Oral Daily   aspirin  81 mg Oral Daily   atorvastatin  40 mg Oral QHS   cholecalciferol  1,000  Units Oral Daily   diltiazem  180 mg Oral Daily   feeding supplement (NEPRO CARB STEADY)  237 mL Oral BID BM   heparin injection (subcutaneous)  5,000 Units Subcutaneous Q8H   hydrALAZINE  25 mg Oral Q8H   insulin aspart  0-6 Units Subcutaneous TID WC   insulin glargine-yfgn  22 Units Subcutaneous Daily   latanoprost  1 drop Both Eyes QHS   multivitamin  1 tablet Oral QHS  Continuous Infusions:  sodium chloride Stopped (05/31/21 1252)   vancomycin Stopped (06/08/21 0537)   Labs: Recent Labs  Lab 06/02/21 0041 06/03/21 0502 06/08/21 0154  NA 135 134* 134*  K 4.5 3.9 4.1  CL 98 97* 98  CO2 22 23 19*  BUN 56* 46* 111*  CREATININE 7.61* 6.26* 11.75*  CALCIUM 8.3* 9.0 8.6*  MG 2.2  --   --   PHOS  --   --  6.2*  GLUCOSE 157* 99 153*  CBGs 210-103-248   Diet Order:   Diet Order             Diet renal/carb modified with fluid restriction Diet-HS Snack? Nothing; Fluid restriction: 1200 mL Fluid; Room service appropriate? Yes; Fluid consistency: Thin  Diet effective now                  EDUCATION NEEDS:  No education needs have been identified at this time  Skin:  Skin Assessment: Reviewed RN Assessment  Last BM:  8/20  Height: Ht Readings from Last 1 Encounters:  06/04/21 '5\' 10"'$  (1.778 m)  Weight:  Wt Readings from Last 1 Encounters:  06/08/21 61.1 kg   Ideal Body Weight:  75.45 kg  BMI:  Body mass index is 19.33 kg/m.  Estimated Nutritional Needs:  Kcal:  2150-2350 Protein:  105-120 grams Fluid:  1000 ml + UOP    Larkin Ina, MS, RD, LDN (she/her/hers) RD pager number and weekend/on-call pager number located in [place].

## 2021-06-08 NOTE — Progress Notes (Signed)
84: HD nurse called and stated she was sending for transporters to come and get pt and bring him down for HD. Pt informed and unhappy about the time. It was explained to pt that there was so many they couldn't get them all done before midnight.  5: Notified pharmacy of need for pt's IV Vancomycin and please send it to HD so they can give it. Pharmacy to send it to them.  0100: HD nurse notified the Pharmacy was sending the Vancomycin down to them.   0105: Left unit per bed for HD. Pt left unit in stable condition

## 2021-06-08 NOTE — Progress Notes (Signed)
Pharmacy Antibiotic Note  Jonathan Kane is a 75 y.o. male admitted on 05/28/2021 found to have MRSA bacteremia secondary to line infection. TEE on 8/19 was negative for endocarditis, but due to unclear MRI brain findings, cannot definitively rule out septic CNS emboli. Per ID recommendation, will treat empirically for endocarditis for 6 weeks. Pharmacy has been consulted for vancomycin dosing.  PTA patient is on HD MWF for ESRD. The patient is s/p RIJ TDC removal on 8/18 with IR for a line holiday. Due to scheduling issues, HD sessions continue to be delayed, last HD session rescheduled for 8/23 @ 0100. Will attempt new vancomycin level prior to next HD session.  Plan: - Continue vancomycin IV 750 mg every MWF (HD) - Obtain vancomycin level prior to next HD session, anticipated 8/24 vs 8/26 - Plan therapy through 07/15/21 - Follow HD schedule  Height: '5\' 10"'$  (177.8 cm) Weight: 61.1 kg (134 lb 11.2 oz) IBW/kg (Calculated) : 73  Temp (24hrs), Avg:98.7 F (37.1 C), Min:98.3 F (36.8 C), Max:99.2 F (37.3 C)  Recent Labs  Lab 06/01/21 1129 06/01/21 1147 06/01/21 1402 06/02/21 0041 06/03/21 0502 06/08/21 0154  WBC  --  13.0*  --  13.3* 12.8* 10.6*  CREATININE  --  6.53*  --  7.61* 6.26* 11.75*  LATICACIDVEN 1.0  --  1.0  --   --   --      Estimated Creatinine Clearance: 4.8 mL/min (A) (by C-G formula based on SCr of 11.75 mg/dL (H)).    Allergies  Allergen Reactions   Dulaglutide     TRULICITY:  GI Upset intolerance    Antimicrobials this admission: 8/16 piperacillin/tazobactam > 8/17 8/16 vancomycin >> (9/29)  Microbiology results: 8/12 COVID/Flu: negative 8/12 MRSA: positive 8/16 BCx: MRSA in 1/4 bottles 8/18 BCx: negative   Thank you for allowing pharmacy to be a part of this patient's care.  Ardyth Harps, PharmD Clinical Pharmacist

## 2021-06-08 NOTE — Progress Notes (Signed)
Occupational Therapy Treatment Patient Details Name: Jonathan Kane MRN: [id number] DOB: September 27, 1946 Today's Date: 06/08/2021    History of present illness 75 y.o. male presenting to ED 8/11 after being found on the floor by a neighbor. CBG >600. En route to ED patient developed R gaze preference and L-sided weakness. Code stroke called. Patient developed seizure and required intubation for airway protection. SBP 229/127 in ED. Patient admitted with acute metabolic encephalopathy with hyperglycemia, hypertensive emergency and new onset seizure with status epilepticus. 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 Extubated 8/15 for HD. Overnight EEG 8/13-8/14 with new onset status epilepticus likely secondary to severe hyperglycemia. PMHx significant for ESRD on HD MWF, DMII, BPH with obstruction, Hep C, and HTN.   OT comments  Pt received in bed upon OT arrival, agreeable to OT session. Pt with flat affect this date, reports feeling frustrated and wanting to go home. Due to cognitive limitations, instability, decreased activity tolerance, decreased safety awareness pt requires minA for functional mobility at spc level, minA for LB dressing and cues for safety. Pt had 1x moderate loss of balance during ambulation and required assistance from therapist to correct. Pt will continue to benefit from skilled OT services to maximize safety and independence with ADL/IADL and functional mobility. Will continue to follow acutely and progress as tolerated.    Follow Up Recommendations  SNF    Equipment Recommendations  Other (comment) (Defer to next level of care.)    Recommendations for Other Services Rehab consult    Precautions / Restrictions Precautions Precautions: Fall Precaution Comments: L inattention Restrictions Weight Bearing Restrictions: No       Mobility Bed Mobility Overal bed mobility: Needs Assistance Bed Mobility: Supine to Sit     Supine to sit: Supervision      General bed mobility comments: supervision for safety, no assistance provided    Transfers Overall transfer level: Needs assistance Equipment used: None Transfers: Sit to/from Stand Sit to Stand: Min assist         General transfer comment: minA for stability with initial stand    Balance Overall balance assessment: Needs assistance Sitting-balance support: Feet supported Sitting balance-Leahy Scale: Good     Standing balance support: Single extremity supported Standing balance-Leahy Scale: Poor Standing balance comment: reliant on UE support and external assist. 1x mod loss of balance, reliant on therapist for stabilization                           ADL either performed or assessed with clinical judgement   ADL Overall ADL's : Needs assistance/impaired     Grooming: Wash/dry hands;Min guard;Wash/dry face Grooming Details (indicate cue type and reason): standing at sink level, assist for safety             Lower Body Dressing: Minimal assistance;Sit to/from stand Lower Body Dressing Details (indicate cue type and reason): assist to fully don shoes Toilet Transfer: Minimal assistance;Ambulation Toilet Transfer Details (indicate cue type and reason): minA for ambulation at spc level, pt with 1x loss of balance Toileting- Clothing Manipulation and Hygiene: Minimal assistance;Sit to/from stand Toileting - Clothing Manipulation Details (indicate cue type and reason): for stability and safety     Functional mobility during ADLs: Minimal assistance;Cane General ADL Comments: Pt had 1x moderate loss of balance, noted weakness in Lside, instability and need for increased assistance.     Vision   Vision Assessment?: No apparent visual deficits   Perception  Praxis      Cognition Arousal/Alertness: Awake/alert Behavior During Therapy: Flat affect Overall Cognitive Status: Impaired/Different from baseline Area of Impairment: Following  commands;Problem solving;Safety/judgement                       Following Commands: Follows one step commands with increased time Safety/Judgement: Decreased awareness of deficits;Decreased awareness of safety Awareness: Emergent Problem Solving: Requires verbal cues;Slow processing General Comments: pt following 2 step commands with increased time. Pt appears to have improved cognition, continues to demonstrate decreased awareness of safety and decreased awareness of need for assistance. Pt with 1x moderate loss of balance during mobility, he did not appear to be aware of significance. Pt not very talkative this date, continue to stated "Im ready to go home".        Exercises     Shoulder Instructions       General Comments vss    Pertinent Vitals/ Pain       Pain Assessment: No/denies pain Pain Intervention(s): Monitored during session  Home Living                                          Prior Functioning/Environment              Frequency  Min 2X/week        Progress Toward Goals  OT Goals(current goals can now be found in the care plan section)  Progress towards OT goals: Progressing toward goals  Acute Rehab OT Goals Patient Stated Goal: Move better and drive OT Goal Formulation: With patient Time For Goal Achievement: 06/15/21 Potential to Achieve Goals: Good ADL Goals Pt Will Perform Eating: Independently;sitting Pt Will Perform Grooming: with supervision;standing Pt Will Perform Upper Body Dressing: with supervision;sitting Pt Will Perform Lower Body Dressing: sit to/from stand;with supervision Pt Will Transfer to Toilet: with supervision;ambulating Pt Will Perform Toileting - Clothing Manipulation and hygiene: with supervision;sit to/from stand Pt/caregiver will Perform Home Exercise Program: Increased ROM;Increased strength;Left upper extremity;With Supervision Additional ADL Goal #1: Patient will attend to 2 grooming  tasks seated EOB without cues indicating increased attention to task.  Plan Discharge plan needs to be updated    Co-evaluation                 AM-PAC OT "6 Clicks" Daily Activity     Outcome Measure   Help from another person eating meals?: A Little Help from another person taking care of personal grooming?: A Little Help from another person toileting, which includes using toliet, bedpan, or urinal?: A Lot Help from another person bathing (including washing, rinsing, drying)?: A Lot Help from another person to put on and taking off regular upper body clothing?: A Little Help from another person to put on and taking off regular lower body clothing?: A Lot 6 Click Score: 15    End of Session Equipment Utilized During Treatment: Gait belt;Other (comment) (spc)  OT Visit Diagnosis: Unsteadiness on feet (R26.81);Muscle weakness (generalized) (M62.81);Hemiplegia and hemiparesis;Pain Hemiplegia - Right/Left: Left Hemiplegia - dominant/non-dominant: Non-Dominant Hemiplegia - caused by: Cerebral infarction Pain - Right/Left:  (bilateral) Pain - part of body:  (headache)   Activity Tolerance Patient tolerated treatment well   Patient Left with call bell/phone within reach;in bed;with bed alarm set   Nurse Communication Mobility status        Time: [id number] OT Time Calculation (min):  13 min  Charges: OT General Charges $OT Visit: 1 Visit OT Treatments $Self Care/Home Management : 8-22 mins  Helene Kelp OTR/L Acute Rehabilitation Services Office: [place] 06/08/2021, 2:35 PM

## 2021-06-08 NOTE — Progress Notes (Signed)
[facility] Progress Note  Subjective: pt seen in room, frustrated about not being able to go home  Vitals:   06/08/21 0435 06/08/21 0440 06/08/21 0742 06/08/21 1146  BP: (!) 147/74 (!) 154/79 (!) 153/81 (!) 150/75  Pulse: 87 84 93 97  Resp: '16 16 20 18  '$ Temp:  98.9 F (37.2 C) 98.4 F (36.9 C) 98.4 F (36.9 C)  TempSrc:  Oral Oral Oral  SpO2: 98% 98%  97%  Weight:  61.1 kg    Height:        Exam:  Alert nad  No jvd  Chest cta bilat  Cor RRR 2/6 sem   Abd soft ntnd   Ext no edema   L RC AVF+bruit    OP HD: [place] MWF     time ?   400/ 1.5  70.5kg   2/2.5   L RC AVF     Assessment/ Plan: Acute encephalopathy - w/ uncont diabetes (BS > 600), HTN emergency and new seizures.  SP IV insulin. Neuro consulted -On Keppra.  MRI 05/30/21 with probable watershed per neuro note.  Repeat MRI negative 06/01/21. MS appears at baseline.  Acute embolic cerebral infarctions: 30 day monitor recommended per neuro.  MRSA bacteremia: on vanc (end date 07/15/21) s/p RIJ TDC removal 8/18 with IR. ID on board, TEE without endocarditis 06/04/21. ESRD - on HD MWF. HD tomorrow.  HD access: has AVF which was last intervened upon 06/2020 showing widely patent AVF w/ collaterals that may be interfering with cannulation.  AVF here functioning okay so far. Likely will need repeat fistulogram, can be accomplished as OP. Acute hypoxic RF- extubated, on RA DKA - sp insulin gtt, back on SQ insulin now.  HTN: on 3 BP lowering meds with soft BP--> off ACEi, on hydralazine 25 mg TID and amlodipine 5 mg daily, cardizem added back at 180 mg daily  Volume- euvolemic on exam, well under dry wt  Dispo: pending      Rob Yarnell Arvidson 06/08/2021, 11:54 AM   Recent Labs  Lab 06/03/21 0502 06/08/21 0154  K 3.9 4.1  BUN 46* 111*  CREATININE 6.26* 11.75*  CALCIUM 9.0 8.6*  PHOS  --  6.2*  HGB 10.6* 9.1*    Inpatient medications:  amLODipine  5 mg Oral Daily   aspirin  81 mg Oral Daily    atorvastatin  40 mg Oral QHS   cholecalciferol  1,000 Units Oral Daily   diltiazem  180 mg Oral Daily   feeding supplement (NEPRO CARB STEADY)  237 mL Oral BID BM   heparin injection (subcutaneous)  5,000 Units Subcutaneous Q8H   hydrALAZINE  25 mg Oral Q8H   insulin aspart  0-6 Units Subcutaneous TID WC   insulin glargine-yfgn  22 Units Subcutaneous Daily   latanoprost  1 drop Both Eyes QHS   multivitamin  1 tablet Oral QHS    sodium chloride Stopped (05/31/21 1252)   vancomycin Stopped (06/08/21 0537)   acetaminophen, docusate sodium, heparin sodium (porcine), labetalol, lidocaine, lidocaine (PF), oxyCODONE

## 2021-06-08 NOTE — Plan of Care (Signed)

## 2021-06-09 ENCOUNTER — Encounter: Payer: Self-pay | Admitting: *Deleted

## 2021-06-09 ENCOUNTER — Other Ambulatory Visit: Payer: Self-pay | Admitting: Student

## 2021-06-09 DIAGNOSIS — I639 Cerebral infarction, unspecified: Secondary | ICD-10-CM

## 2021-06-09 NOTE — Progress Notes (Signed)
Ordered outpatient 30-day Event Monitor for further evaluation of stroke at the request of Dr. Mal Misty ([facility]). He has not been seen by our group since 2018. Therefore, New Patient visit with Dr. Gasper Sells will also be arranged to discuss monitor results.  Darreld Mclean, PA-C 06/09/2021 2:54 PM

## 2021-06-09 NOTE — Progress Notes (Signed)
Pt left unit per EMS for transfer to [facility]. Pt left unit in stable condition

## 2021-06-09 NOTE — Progress Notes (Signed)
Report given to [facility] at [facility]

## 2021-06-09 NOTE — Progress Notes (Signed)
Patient ID: Jonathan Kane, male   DOB: 1946-05-03, 75 y.o.   MRN: [id number] Patient enrolled for [employer] to ship a 30 day cardiac event monitor to : [facility] in care of [facility] [address] [place], [place]  [address] Attn: Marina Goodell [phone]  Letter with instructions mailed to same address.

## 2021-06-29 ENCOUNTER — Ambulatory Visit: Payer: Medicare Other | Admitting: Endocrinology

## 2021-07-23 ENCOUNTER — Ambulatory Visit
Admission: RE | Admit: 2021-07-23 | Discharge: 2021-07-23 | Disposition: A | Discharge status: Home / Self Care | Payer: Medicare Other | Source: Ambulatory Visit | Attending: Student | Admitting: Student

## 2021-07-23 ENCOUNTER — Other Ambulatory Visit: Payer: Self-pay

## 2021-07-23 ENCOUNTER — Other Ambulatory Visit: Payer: Self-pay | Admitting: Student

## 2021-07-23 DIAGNOSIS — R29898 Other symptoms and signs involving the musculoskeletal system: Secondary | ICD-10-CM

## 2021-07-23 IMAGING — CR DG HIP (WITH OR WITHOUT PELVIS) 2-3V*R*
3 series · 3 of 3 positions shown · non-contrast
Comparison: None.

CLINICAL DATA: Right leg weakness

EXAM:
DG HIP (WITH OR WITHOUT PELVIS) 2-3V RIGHT

[w knee ap right]
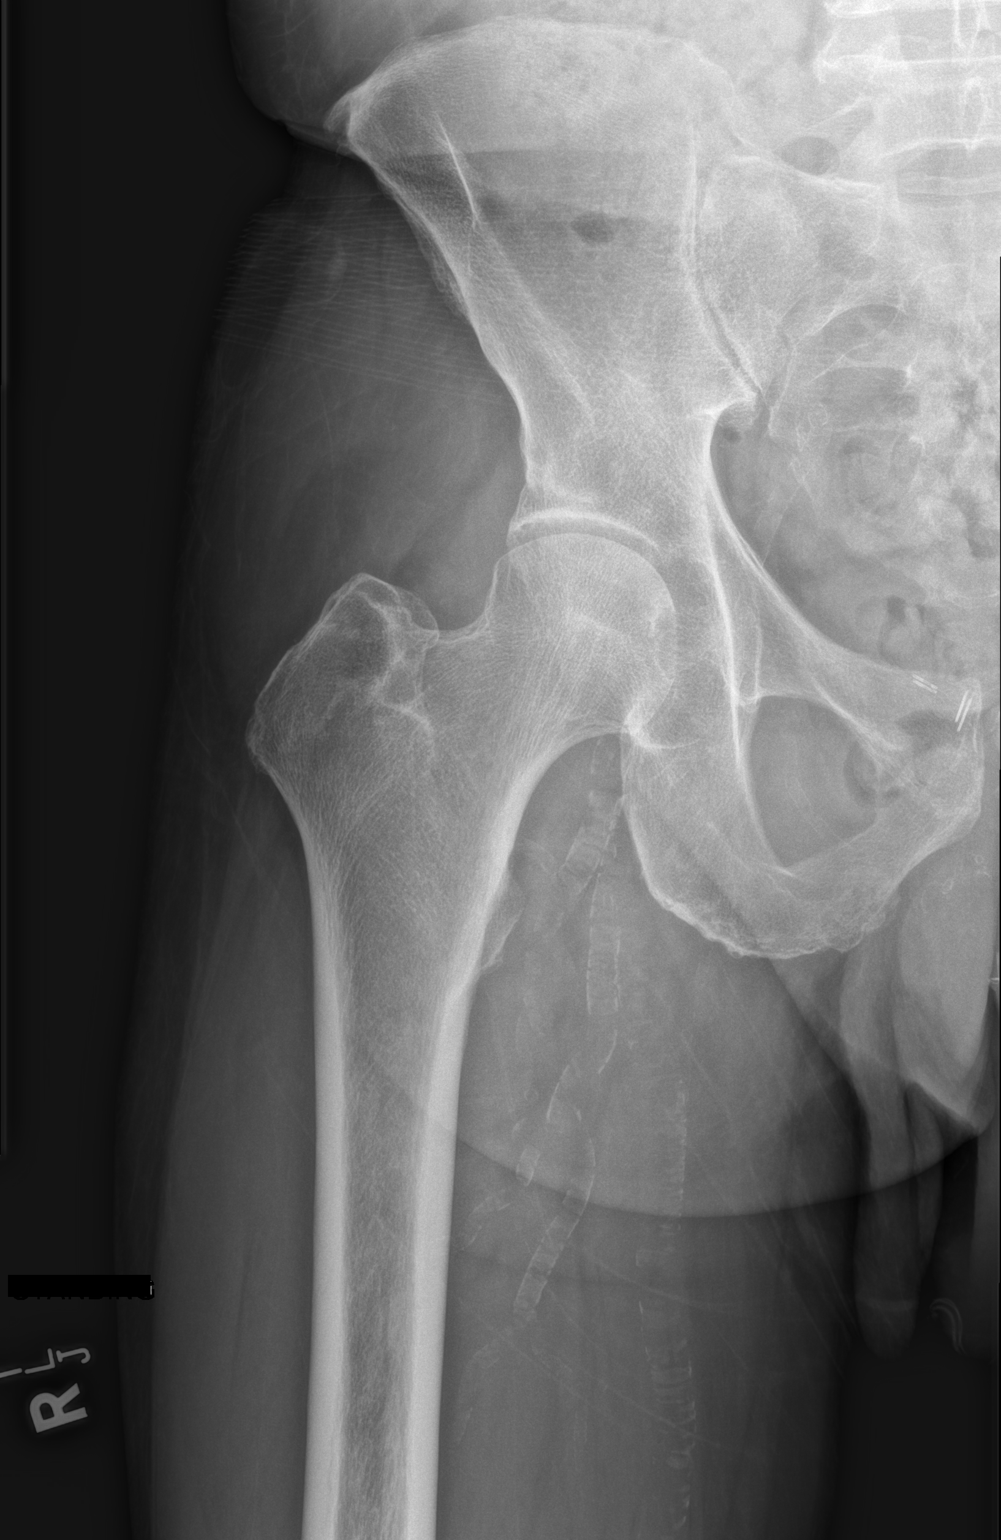

[w hip frog right]
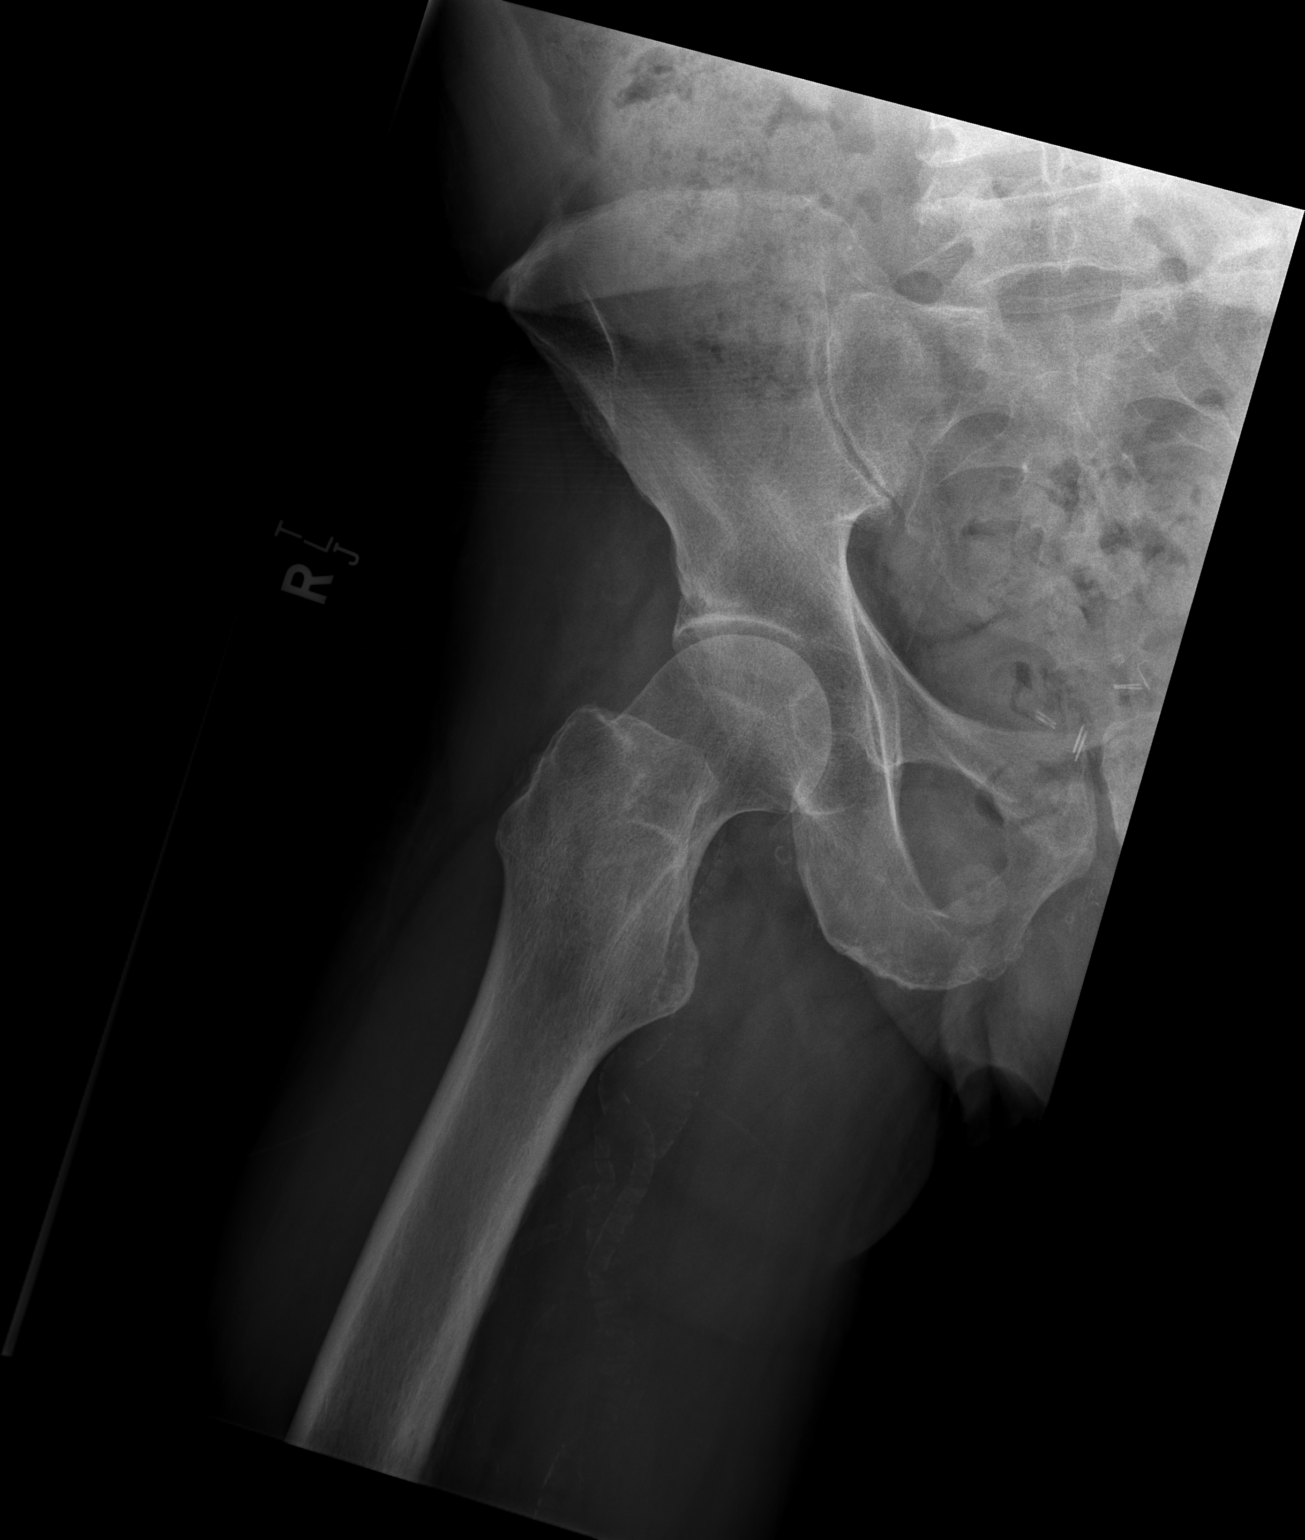

[w pelvis upright]
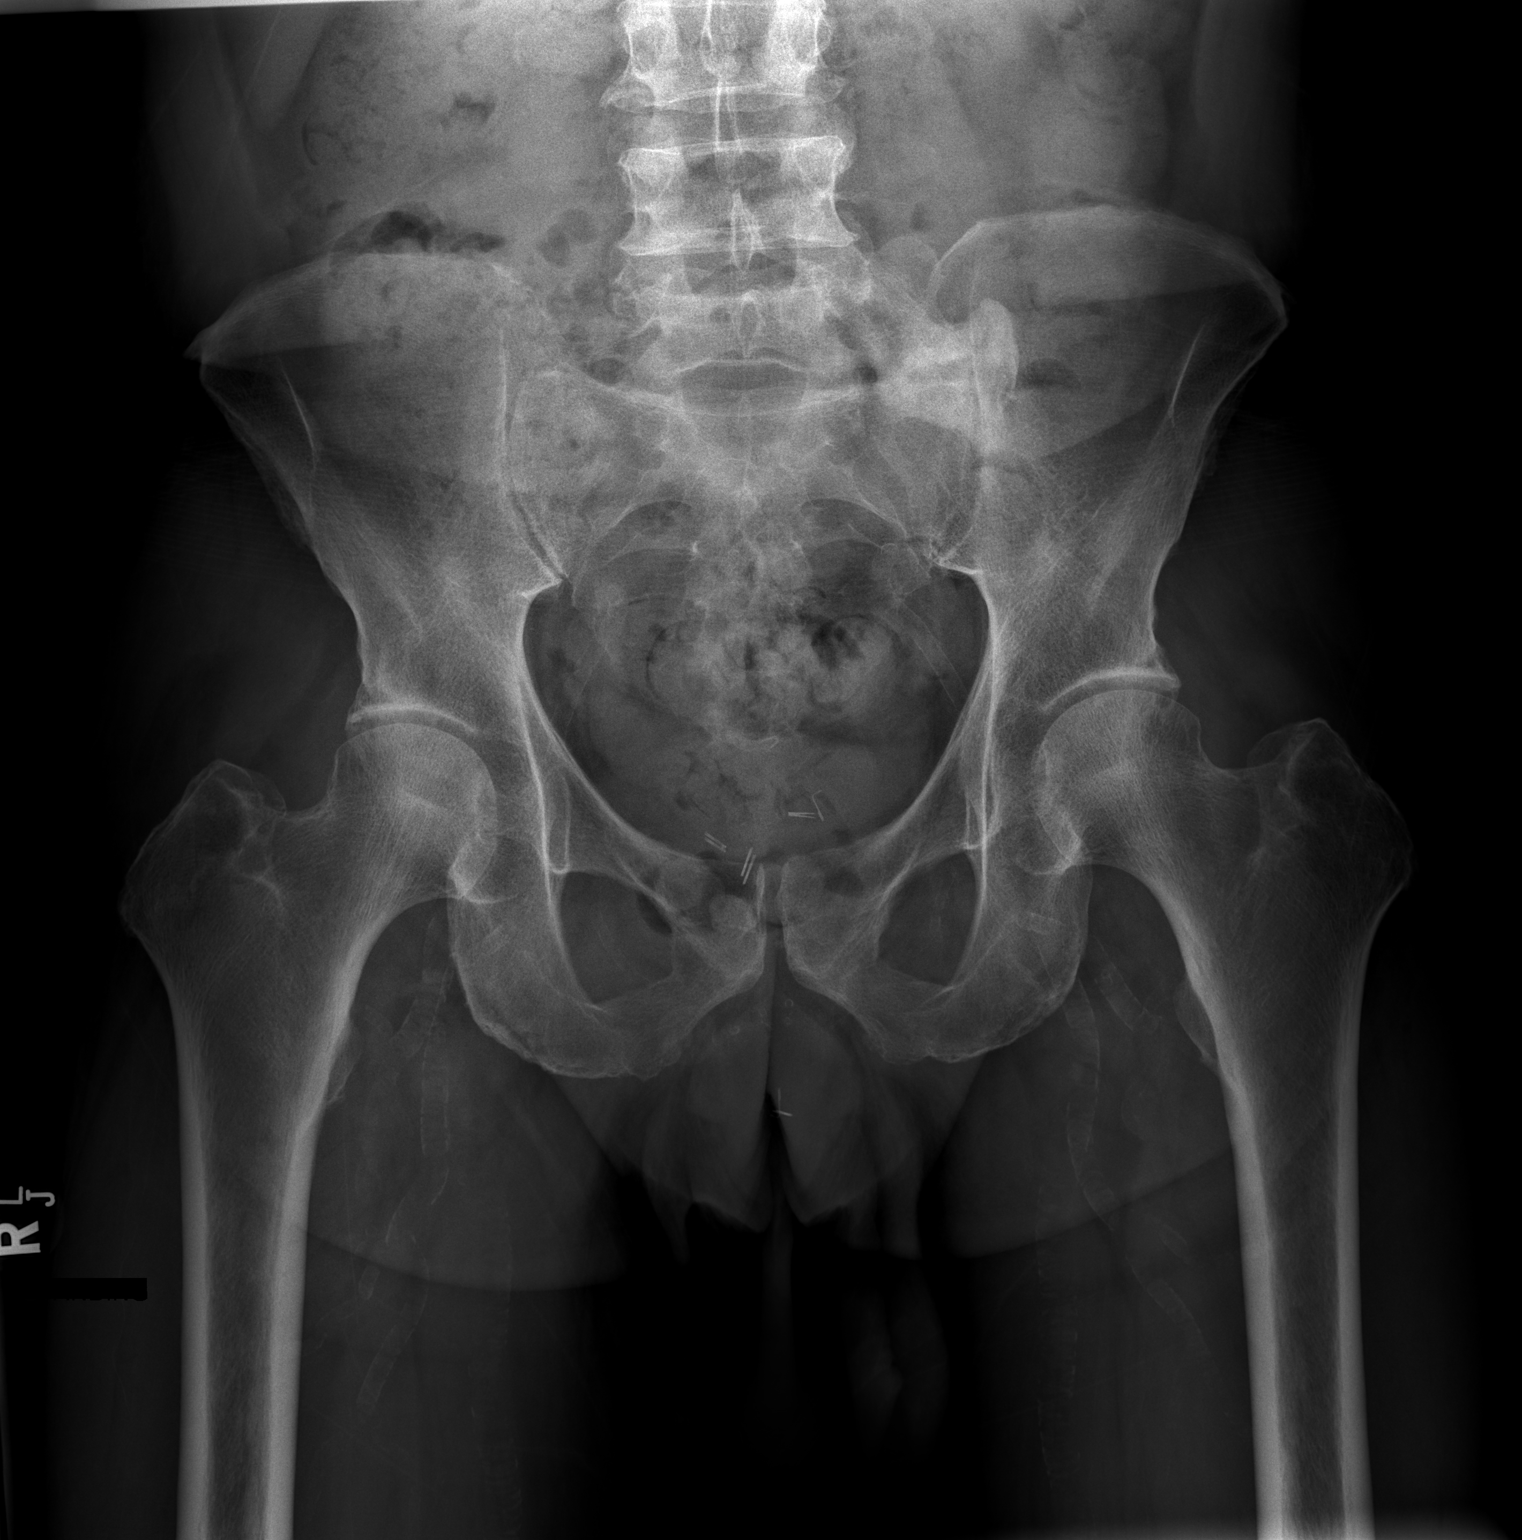

[3 of 3 positions shown; findings below may reference images not displayed]

FINDINGS: Normal alignment. No fracture or dislocation. Hip joint spaces are
preserved. Vascular calcifications are noted within the pelvis and
medial thighs bilaterally. Surgical clips are seen overlying the
scrotum and within the expected location of the prostate gland
IMPRESSION: No acute fracture or dislocation.

## 2021-07-23 IMAGING — CR DG KNEE 3 VIEWS*R*
3 series · 3 of 3 positions shown · non-contrast
Comparison: None.

CLINICAL DATA: Remote fall, right leg weakness

EXAM:
RIGHT KNEE - 3 VIEW

[w knee ap right]
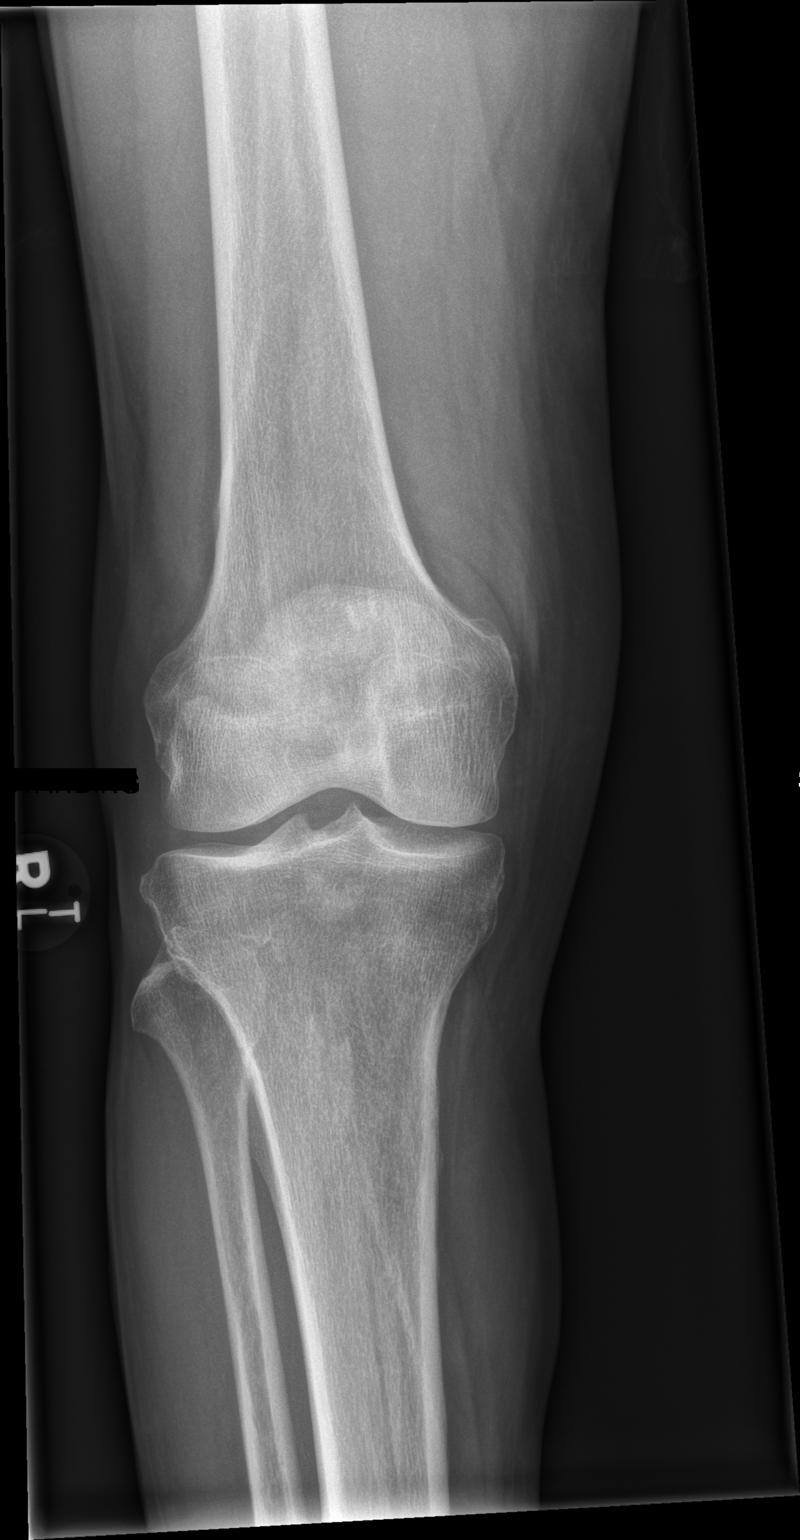

[w knee lat right]
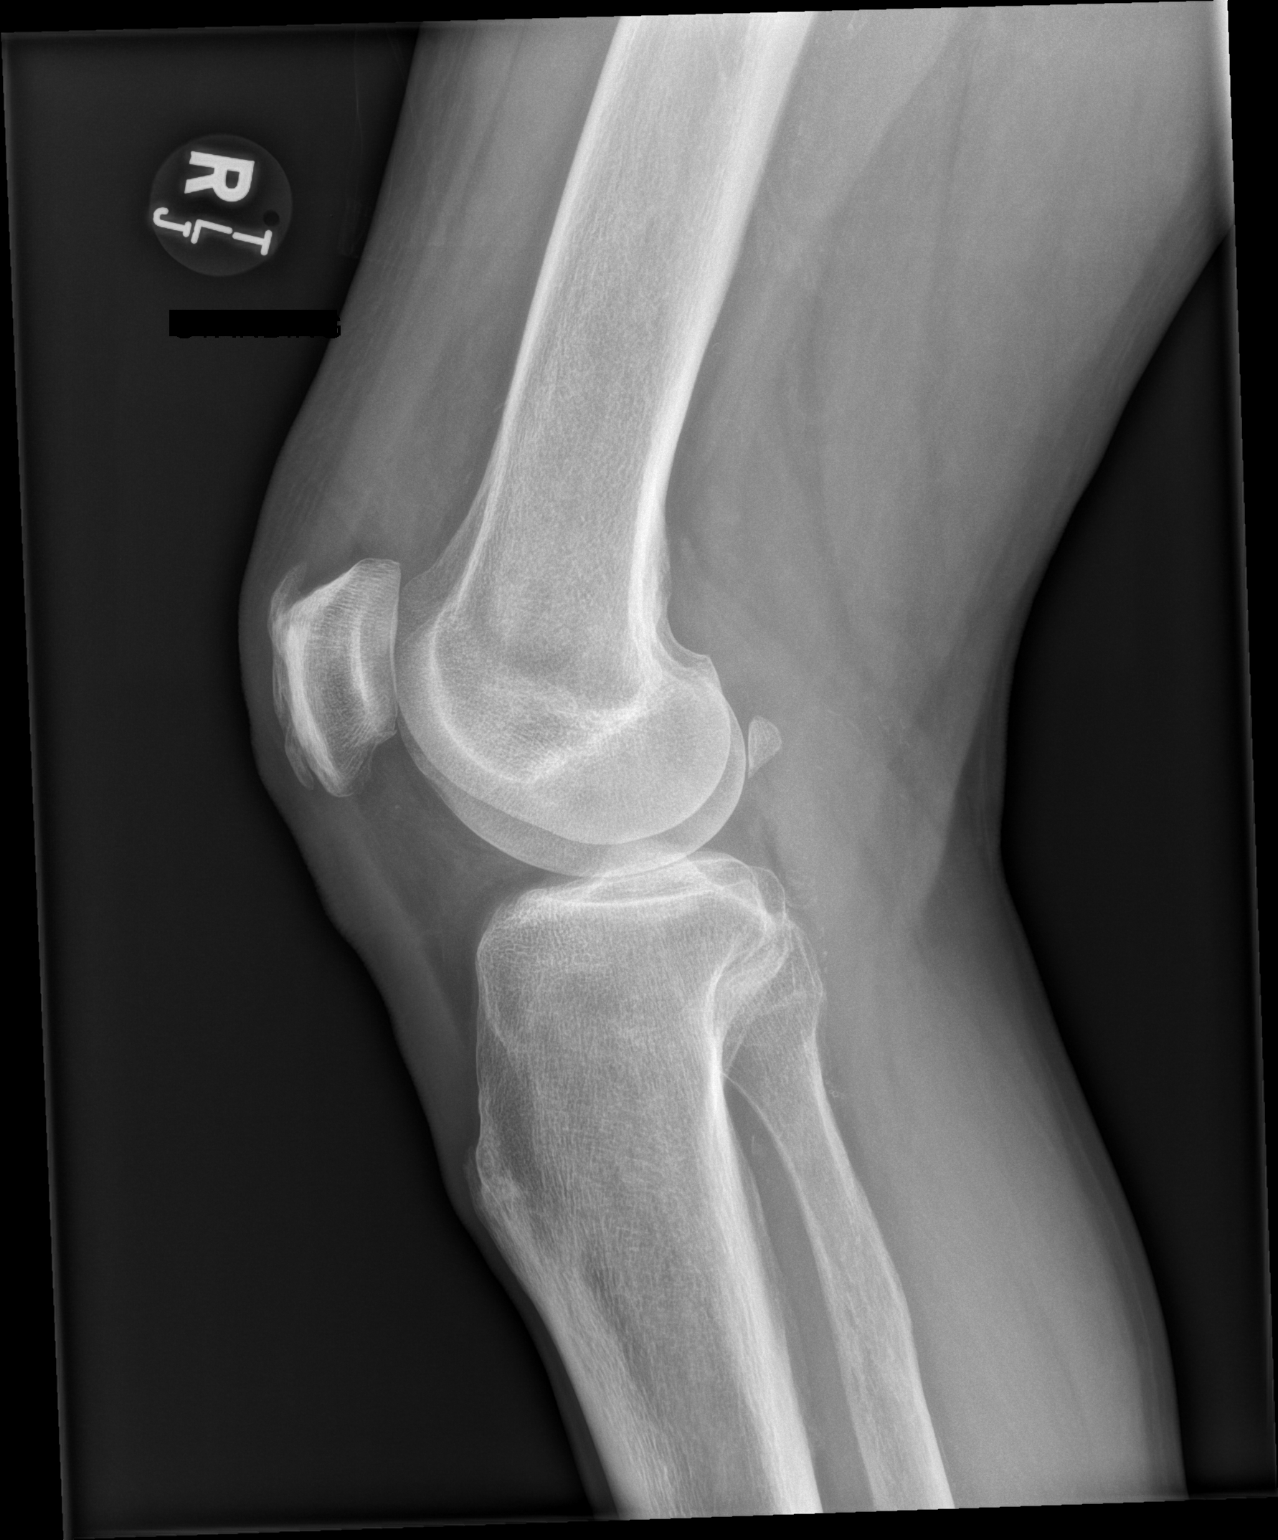

[x knee sunrise right]
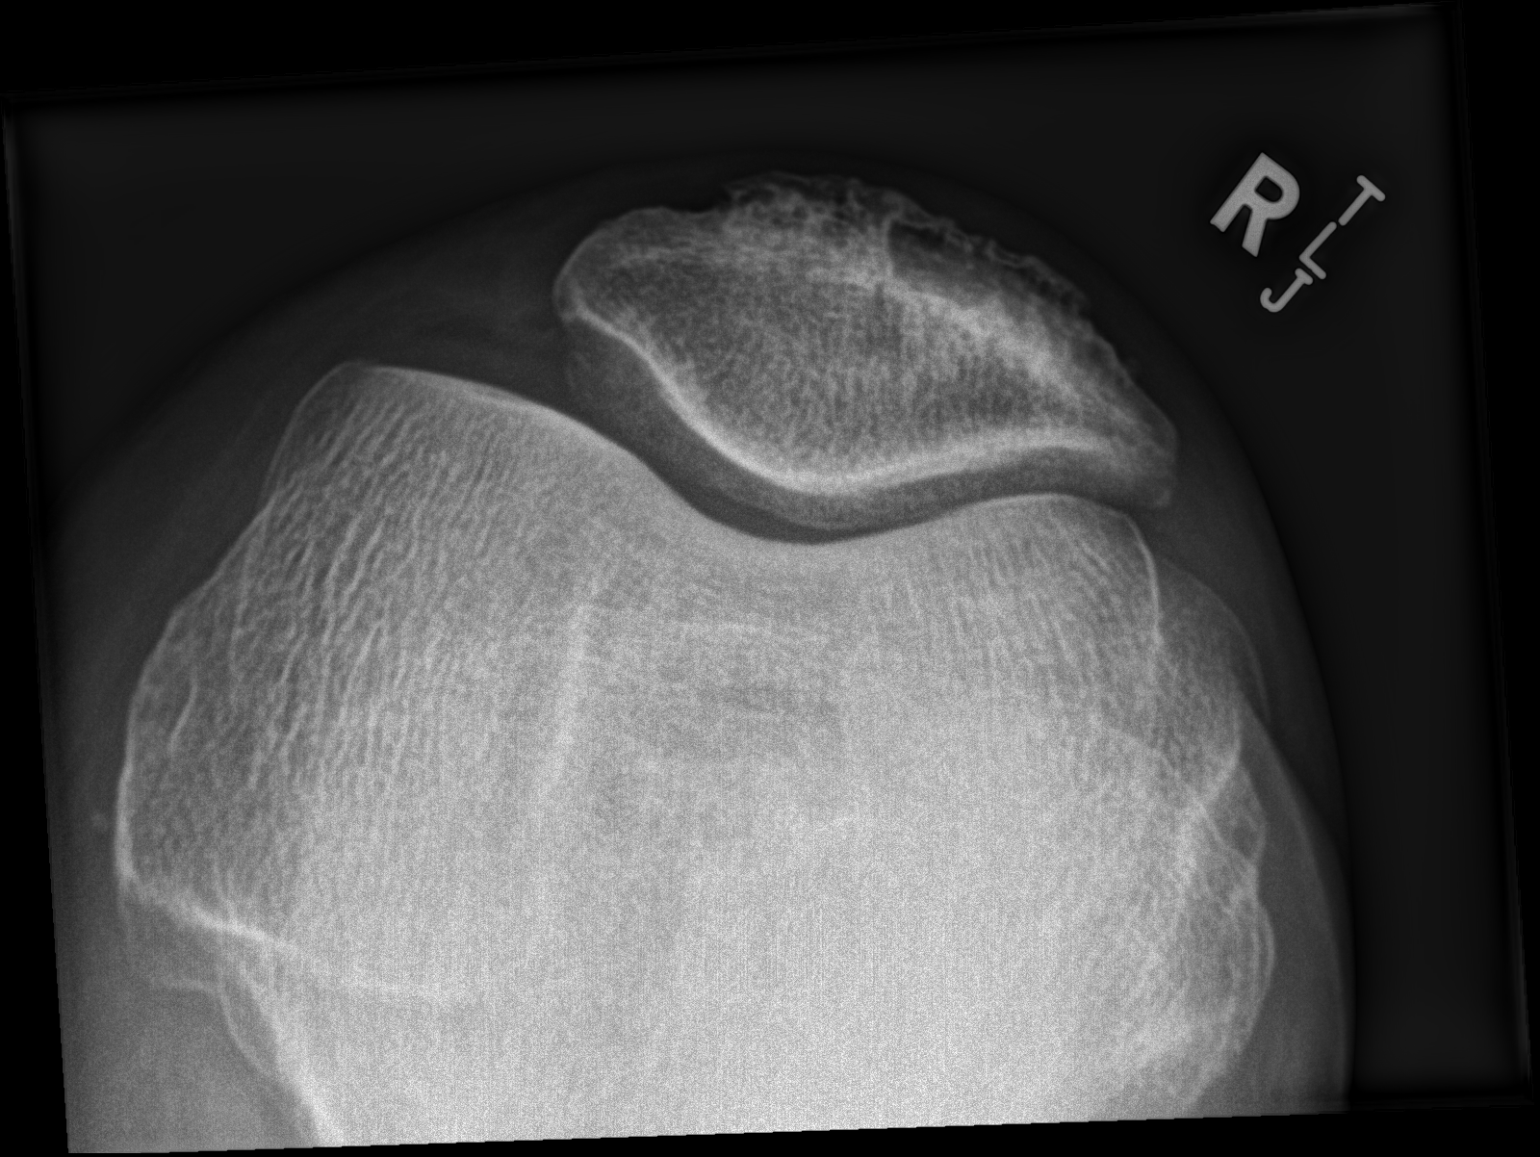

[3 of 3 positions shown; findings below may reference images not displayed]

FINDINGS: Normal alignment. No acute fracture or dislocation. No effusion.
Mild degenerative the soft the involving the insertion of the
quadriceps and patellar tendons upon the patella. Soft tissues are
otherwise unremarkable.
IMPRESSION: No acute fracture or dislocation.

## 2021-08-04 ENCOUNTER — Encounter (HOSPITAL_COMMUNITY): Payer: Self-pay

## 2021-08-04 ENCOUNTER — Other Ambulatory Visit: Payer: Self-pay

## 2021-08-04 ENCOUNTER — Emergency Department (HOSPITAL_COMMUNITY)
Admission: EM | Admit: 2021-08-04 | Discharge: 2021-08-04 | Disposition: A | Discharge status: Home / Self Care | Payer: Medicare Other | Attending: Emergency Medicine | Admitting: Emergency Medicine

## 2021-08-04 DIAGNOSIS — Z992 Dependence on renal dialysis: Secondary | ICD-10-CM | POA: Diagnosis not present

## 2021-08-04 DIAGNOSIS — I120 Hypertensive chronic kidney disease with stage 5 chronic kidney disease or end stage renal disease: Secondary | ICD-10-CM | POA: Diagnosis not present

## 2021-08-04 DIAGNOSIS — I10 Essential (primary) hypertension: Secondary | ICD-10-CM

## 2021-08-04 DIAGNOSIS — N186 End stage renal disease: Secondary | ICD-10-CM | POA: Diagnosis not present

## 2021-08-04 DIAGNOSIS — Z87891 Personal history of nicotine dependence: Secondary | ICD-10-CM | POA: Insufficient documentation

## 2021-08-04 DIAGNOSIS — Z794 Long term (current) use of insulin: Secondary | ICD-10-CM | POA: Diagnosis not present

## 2021-08-04 DIAGNOSIS — R030 Elevated blood-pressure reading, without diagnosis of hypertension: Secondary | ICD-10-CM

## 2021-08-04 DIAGNOSIS — E1165 Type 2 diabetes mellitus with hyperglycemia: Secondary | ICD-10-CM | POA: Insufficient documentation

## 2021-08-04 DIAGNOSIS — R252 Cramp and spasm: Secondary | ICD-10-CM | POA: Diagnosis not present

## 2021-08-04 DIAGNOSIS — M62838 Other muscle spasm: Secondary | ICD-10-CM

## 2021-08-04 DIAGNOSIS — R739 Hyperglycemia, unspecified: Secondary | ICD-10-CM

## 2021-08-04 LAB — COMPREHENSIVE METABOLIC PANEL
ALT: 15 U/L (ref 0–44)
AST: 18 U/L (ref 15–41)
Albumin: 3.9 g/dL (ref 3.5–5.0)
Alkaline Phosphatase: 76 U/L (ref 38–126)
Anion gap: 17 — ABNORMAL HIGH (ref 5–15)
BUN: 61 mg/dL — ABNORMAL HIGH (ref 8–23)
CO2: 22 mmol/L (ref 22–32)
Calcium: 9.2 mg/dL (ref 8.9–10.3)
Chloride: 92 mmol/L — ABNORMAL LOW (ref 98–111)
Creatinine, Ser: 9.5 mg/dL — ABNORMAL HIGH (ref 0.61–1.24)
GFR, Estimated: 5 mL/min — ABNORMAL LOW (ref >60)
Glucose, Bld: 582 mg/dL (ref 70–99)
Potassium: 4.2 mmol/L (ref 3.5–5.1)
Sodium: 131 mmol/L — ABNORMAL LOW (ref 135–145)
Total Bilirubin: 0.6 mg/dL (ref 0.3–1.2)
Total Protein: 8.3 g/dL — ABNORMAL HIGH (ref 6.5–8.1)

## 2021-08-04 LAB — CBC WITH DIFFERENTIAL/PLATELET
Abs Immature Granulocytes: 0.01 10*3/uL (ref 0.00–0.07)
Basophils Absolute: 0.1 10*3/uL (ref 0.0–0.1)
Basophils Relative: 1 %
Eosinophils Absolute: 0.3 10*3/uL (ref 0.0–0.5)
Eosinophils Relative: 5 %
HCT: 33.7 % — ABNORMAL LOW (ref 39.0–52.0)
Hemoglobin: 11.5 g/dL — ABNORMAL LOW (ref 13.0–17.0)
Immature Granulocytes: 0 %
Lymphocytes Relative: 15 %
Lymphs Abs: 0.8 10*3/uL (ref 0.7–4.0)
MCH: 31.1 pg (ref 26.0–34.0)
MCHC: 34.1 g/dL (ref 30.0–36.0)
MCV: 91.1 fL (ref 80.0–100.0)
Monocytes Absolute: 0.5 10*3/uL (ref 0.1–1.0)
Monocytes Relative: 10 %
Neutro Abs: 3.6 10*3/uL (ref 1.7–7.7)
Neutrophils Relative %: 69 %
Platelets: 170 10*3/uL (ref 150–400)
RBC: 3.7 MIL/uL — ABNORMAL LOW (ref 4.22–5.81)
RDW: 14.4 % (ref 11.5–15.5)
WBC: 5.2 10*3/uL (ref 4.0–10.5)
nRBC: 0 % (ref 0.0–0.2)

## 2021-08-04 LAB — CBG MONITORING, ED
Glucose-Capillary: 506 mg/dL (ref 70–99)
Glucose-Capillary: 476 mg/dL — ABNORMAL HIGH (ref 70–99)

## 2021-08-04 LAB — MAGNESIUM: Magnesium: 2.4 mg/dL (ref 1.7–2.4)

## 2021-08-04 MED ORDER — METHOCARBAMOL 500 MG PO TABS
500.0000 mg | ORAL_TABLET | Freq: Two times a day (BID) | ORAL | 0 refills | Status: DC
Start: 2021-08-04 — End: 2021-08-12

## 2021-08-04 MED ORDER — INSULIN ASPART 100 UNIT/ML IJ SOLN
7.0000 [IU] | Freq: Once | INTRAMUSCULAR | Status: AC
  Administered 2021-08-04: 7 [IU] via SUBCUTANEOUS

## 2021-08-04 MED ORDER — HYDRALAZINE HCL 20 MG/ML IJ SOLN
10.0000 mg | Freq: Once | INTRAMUSCULAR | Status: AC
  Administered 2021-08-04: 10 mg via INTRAVENOUS
  Filled 2021-08-04: qty 1

## 2021-08-04 MED ORDER — LORAZEPAM 1 MG PO TABS
1.0000 mg | ORAL_TABLET | Freq: Once | ORAL | Status: AC
  Administered 2021-08-04: 1 mg via ORAL
  Filled 2021-08-04: qty 1

## 2021-08-04 NOTE — ED Provider Notes (Signed)
[facility] EMERGENCY DEPARTMENT Provider Note   CSN: [id number] Arrival date & time: 08/04/21  1447     History Chief Complaint  Patient presents with   Hyperglycemia    Jonathan Kane is a 75 y.o. male.  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  He states that today his symptoms have been persistent he states that the spasms are uncomfortable when they occur but he has no pain when he is not having a spasm.  He denies any injury to his leg.  Denies any back pain no bowel or bladder incontinence. No falls or trauma.  Does not seem to have any aggravating or mitigating factors.  Has tried medications prior to arrival.  States that he uses his [place] as prescribed.       Past Medical History:  Diagnosis Date   Diabetes mellitus without complication ([place])    End stage kidney disease ([place])    Erectile dysfunction    Hypertension    Vitamin D deficiency     Patient Active Problem List   Diagnosis Date Noted   MRSA bacteremia 06/02/2021   Malnutrition of moderate degree 06/01/2021   DKA (diabetic ketoacidosis) ([place]) 05/28/2021   Status epilepticus ([place])    Memory loss 04/13/2021   Cerebrovascular accident (CVA) ([place]) 01/12/2021   DM type 2 with diabetic peripheral neuropathy ([place]) 01/12/2021   Normal anion gap metabolic acidosis [id number]   Secondary hyperparathyroidism of renal origin ([place]) [id number]   Acute metabolic encephalopathy [id number]   Acute on chronic renal failure ([place]) 11/07/2019   CKD (chronic kidney disease) stage 5, GFR less than 15 ml/min (HCC) 09/02/2019   CKD (chronic kidney disease) stage 4, GFR 15-29 ml/min (HCC) 08/01/2019   Paronychia of fifth toe of left foot 05/29/2019    Microalbuminuria due to type 2 diabetes mellitus ([place]) 02/14/2019   Posterior vitreous detachment of both eyes 01/17/2019   Presbyopia of both eyes 01/17/2019   Pseudophakia of both eyes 01/17/2019   Abnormal finding on diagnostic imaging of right testicle 09/14/2018   Epididymitis, left 09/14/2018   Swelling of left testicle 08/23/2018   Primary open angle glaucoma (POAG) of both eyes, mild stage 01/02/2018   Benign hypertension with chronic kidney disease, stage V ([place]) 11/30/2017   Proteinuria 11/30/2017   Mouth burn, initial encounter 11/10/2017   Vitamin D deficiency, unspecified 11/10/2017   Erectile dysfunction associated with type 2 diabetes mellitus ([place]) 08/29/2017   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 ([place]) 08/16/2017   Hypoglycemia 08/16/2017   PCO (posterior capsular opacification), left 03/03/2017   Hypermetropia of both eyes 03/03/2017   Cocaine dependence in remission ([place]) 07/26/2016   Herpes simplex infection 07/26/2016   CKD stage 4 due to type 2 diabetes mellitus ([place]) 07/07/2016   Noncompliance with diabetes treatment 07/07/2016   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 03/21/2016   Combined arterial insufficiency and corporo-venous occlusive erectile dysfunction 03/04/2016   Allergic rhinitis with postnasal drip 02/24/2016   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   Type 2 diabetes mellitus ([place]) 02/23/2016   Essential hypertension 02/23/2016   Musculoskeletal chest pain 02/23/2016   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[place]) 12/09/2015   Polyneuropathy, unspecified  11/12/2013   Constipation 03/23/2013   Hallucinations 03/23/2013   Polyneuropathy in diabetes ([place]) 03/23/2013   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[place]) 08/30/2011   Diabetes mellitus type II, controlled ([place]) 08/30/2011   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    Past Surgical History:  Procedure Laterality  Date   COLONOSCOPY     IR DIALY SHUNT INTRO NEEDLE/INTRACATH INITIAL W/IMG LEFT Left 06/25/2020   IR REMOVAL TUN CV CATH W/O FL  06/03/2021   keloid removed     TEE WITHOUT CARDIOVERSION N/A 06/04/2021   Procedure: TRANSESOPHAGEAL ECHOCARDIOGRAM (TEE);  Surgeon: Skeet Latch, MD;  Location: [facility] ENDOSCOPY;  Service: Cardiovascular;  Laterality: N/A;       Family History  Problem Relation Age of Onset   Ovarian cancer Mother    Other Father        unsure of medical history    Social History   Tobacco Use   Smoking status: Former   Smokeless tobacco: Never  Vaping Use   Vaping Use: Never used  Substance Use Topics   Alcohol use: Yes    Comment: very litte - occasional beer   Drug use: No    Home Medications Prior to Admission medications   Medication Sig Start Date End Date Taking? Authorizing Provider  methocarbamol (ROBAXIN) 500 MG tablet Take 1 tablet (500 mg total) by mouth 2 (two) times daily. 08/04/21  Yes Subrena Devereux S, PA  ACCU-CHEK AVIVA PLUS test strip 1 each by Other route as needed.  04/06/17   [provider]  ACCU-CHEK SOFTCLIX LANCETS lancets CHECK BLOOD SUGAR TWO TO THREE TIMES DAILY 04/05/17   [provider]  acetaminophen (TYLENOL) 325 MG tablet Take 2 tablets (650 mg total) by mouth every 6 (six) hours as needed for fever, mild pain or headache. 06/08/21   Jennye Boroughs, MD  Alcohol Swabs (B-D SINGLE USE SWABS REGULAR) PADS USE TO CHECK BLOOD SUGAR AS DIRECTED 01/26/17   [provider]  amLODipine (NORVASC) 10 MG tablet Take 10 mg by mouth daily. 05/20/20   [provider]  aspirin EC 81 MG tablet Take 81 mg by mouth daily.    [provider]  atorvastatin (LIPITOR) 40 MG tablet Take 40 mg by mouth at bedtime. 05/20/20   [provider]  Blood Glucose Monitoring Suppl (GLUCOCOM BLOOD GLUCOSE MONITOR) DEVI Use as directed.  Dx Code: E11.621 10/26/16   [provider]  calcitRIOL (ROCALTROL) 0.25 MCG  capsule Take 0.25 mcg by mouth daily. Patient not taking: Reported on 06/01/2021    [provider]  cholecalciferol (VITAMIN D3) 25 MCG (1000 UNIT) tablet Take 1,000 Units by mouth daily.    [provider]  diltiazem (TIAZAC) 180 MG 24 hr capsule Take 180 mg by mouth daily. 08/04/20   [provider]  hydrALAZINE (APRESOLINE) 25 MG tablet Take 1 tablet (25 mg total) by mouth every 8 (eight) hours. 06/08/21   Jennye Boroughs, MD  Insulin Pen Needle 31G X 8 MM MISC Use as directed per sliding scale.  Dx Code: E11.621 02/03/16   [provider]  latanoprost (XALATAN) 0.005 % ophthalmic solution Place 1 drop into both eyes at bedtime. 05/24/19   [provider]  sevelamer carbonate (RENVELA) 800 MG tablet Take 800-2,400 mg by mouth See admin instructions. Take 3 tablets (2400 mg) by mouth with meals and 1 tablet (800 mg) with snacks Patient not taking: Reported on 06/01/2021 07/17/20   [provider]  sildenafil (REVATIO) 20 MG tablet Take 40-60  mg by mouth daily as needed (ED).  10/10/19   [provider]  sodium bicarbonate 650 MG tablet Take 1 tablet (650 mg total) by mouth 2 (two) times daily. Patient taking differently: Take 650 mg by mouth daily. 11/08/19   Santos-Sanchez, Merlene Morse, MD  TRADJENTA 5 MG TABS tablet Take 5 mg by mouth daily. 06/16/20   [provider]  TRESIBA FLEXTOUCH 100 UNIT/ML FlexTouch Pen Inject 22 Units into the skin daily. 06/08/21   Jennye Boroughs, MD    Allergies    Dulaglutide  Review of Systems   Review of Systems  Constitutional:  Negative for chills and fever.  HENT:  Negative for congestion.   Eyes:  Negative for pain.  Respiratory:  Negative for cough and shortness of breath.   Cardiovascular:  Negative for chest pain and leg swelling.  Gastrointestinal:  Negative for abdominal pain and vomiting.  Genitourinary:  Negative for dysuria.  Musculoskeletal:  Negative for myalgias.       Right thigh  spasm  Skin:  Negative for rash.  Neurological:  Negative for dizziness and headaches.   Physical Exam Updated Vital Signs BP (!) 209/115   Pulse 76   Resp 14   Ht '5\' 10"'$  (1.778 m)   Wt 63.5 kg   SpO2 99%   BMI 20.09 kg/m   Physical Exam Vitals and nursing note reviewed.  Constitutional:      General: He is not in acute distress.    Comments: Pleasant well-appearing 75 year old.  In no acute distress.  Sitting comfortably in bed.  Able answer questions appropriately follow commands. No increased work of breathing. Speaking in full sentences.   HENT:     Head: Normocephalic and atraumatic.     Nose: Nose normal.  Eyes:     General: No scleral icterus. Cardiovascular:     Rate and Rhythm: Normal rate and regular rhythm.     Pulses: Normal pulses.     Heart sounds: Normal heart sounds.  Pulmonary:     Effort: Pulmonary effort is normal. No respiratory distress.     Breath sounds: Normal breath sounds. No wheezing.  Abdominal:     Palpations: Abdomen is soft.     Tenderness: There is no abdominal tenderness. There is no guarding or rebound.  Musculoskeletal:     Cervical back: Normal range of motion.     Right lower leg: No edema.     Left lower leg: No edema.     Comments: 5/5 strength in bilateral lower extremity specifically at the hip flexion extension knee flexion extension and ankle flexion extension  Skin:    General: Skin is warm and dry.     Capillary Refill: Capillary refill takes less than 2 seconds.  Neurological:     Mental Status: He is alert. Mental status is at baseline.  Psychiatric:        Mood and Affect: Mood normal.        Behavior: Behavior normal.    ED Results / Procedures / Treatments   Labs (all labs ordered are listed, but only abnormal results are displayed) Labs Reviewed  CBC WITH DIFFERENTIAL/PLATELET - Abnormal; Notable for the following components:      Result Value   RBC 3.70 (*)    Hemoglobin 11.5 (*)    HCT 33.7 (*)    All  other components within normal limits  COMPREHENSIVE METABOLIC PANEL - Abnormal; Notable for the following components:   Sodium 131 (*)    Chloride  92 (*)    Glucose, Bld 582 (*)    BUN 61 (*)    Creatinine, Ser 9.50 (*)    Total Protein 8.3 (*)    GFR, Estimated 5 (*)    Anion gap 17 (*)    All other components within normal limits  CBG MONITORING, ED - Abnormal; Notable for the following components:   Glucose-Capillary 506 (*)    All other components within normal limits  CBG MONITORING, ED - Abnormal; Notable for the following components:   Glucose-Capillary 476 (*)    All other components within normal limits  MAGNESIUM    EKG EKG Interpretation  Date/Time:  Wednesday August 04 2021 15:05:24 EDT Ventricular Rate:  84 PR Interval:  152 QRS Duration: 89 QT Interval:  409 QTC Calculation: 484 R Axis:   62 Text Interpretation: Sinus rhythm Atrial premature complex Left atrial enlargement Anterior infarct, old NSR, similar to previous Confirmed by Lavenia Atlas [phone]) on 08/04/2021 3:12:55 PM  Radiology No results found.  Procedures Procedures   Medications Ordered in ED Medications  LORazepam (ATIVAN) tablet 1 mg (1 mg Oral Given 08/04/21 1542)  insulin aspart (novoLOG) injection 7 Units (7 Units Subcutaneous Given 08/04/21 1733)    ED Course  I have reviewed the triage vital signs and the nursing notes.  Pertinent labs & imaging results that were available during my care of the patient were reviewed by me and considered in my medical decision making (see chart for details).    MDM Rules/Calculators/A&P                          Patient here for crampy pain in right thigh that is a spasm-like pain.  Skipped dialysis to come to the ER.  Denies any chest pain shortness of breath lightheadedness dizziness physical exam is relatively unremarkable has no muscular tenderness and has good distal pulses in bilateral feet with dorsalis pedal pulses that are 3+ and  symmetric has good movement of lower extremities does occasionally seem to have spasms.  Electrolytes unremarkable hyperglycemia 582 improved 476 with subcu insulin likely will continue to downtrend and patient will go home and use his long-acting insulin that he is due for BUN and creatinine elevated consistent with patient needing dialysis.  CBC with chronic anemia no leukocytosis.  Magnesium within normal limits.  EKG with no acute changes   I discussed this case with my attending physician who cosigned this note including patient's presenting symptoms, physical exam, and planned diagnostics and interventions. Attending physician stated agreement with plan or made changes to plan which were implemented.   Patient hypertensive.  Given 1 dose of hydralazine here in the ER which improved blood pressure to 0000000 systolic.  He is due for hydralazine at home.  I recommend he take another dose when he goes home as prescribed follow-up with his primary care to recheck his blood pressure and his endocrinologist to check his blood sugar and antihyperglycemic regimen.  Patient states he feels improved after 1 dose of Ativan will discharge home with Robaxin for muscle spasms.  Recommended close follow-up with dialysis center.  Return precautions given.  Final Clinical Impression(s) / ED Diagnoses Final diagnoses:  Hyperglycemia  Elevated blood pressure reading  Hypertension, unspecified type  Muscle spasm    Rx / DC Orders ED Discharge Orders          Ordered    methocarbamol (ROBAXIN) 500 MG tablet  2 times daily  08/04/21 1855             Tedd Sias, PA 08/04/21 2124    Lorelle Gibbs, DO 08/04/21 2333

## 2021-08-04 NOTE — Discharge Instructions (Addendum)
Please take your blood pressure medications as prescribed blood pressure is quite high here in the ER today.  Your work-up today was quite reassuring.  Please follow-up with your dialysis center either tomorrow or Friday for your scheduled dialysis.  I have prescribed you a short course of a muscle relaxer called Robaxin please follow-up with your primary care doctor to talk more in depth about the muscle spasms use you have been experiencing  You also need to follow-up with your endocrinologist to continue to titrate your hyperglycemia regimen.  Please take your long-acting insulin week at home.  You may always return to the ER for any new or concerning symptoms as we discussed.

## 2021-08-04 NOTE — ED Triage Notes (Signed)
[facility] reports pt coming from home with c/o muscle spasms in right thigh area. Pt is dialysis pt and missed it today due to the spasms in his leg. Denies any chest pain or sob.

## 2021-08-07 ENCOUNTER — Emergency Department (HOSPITAL_BASED_OUTPATIENT_CLINIC_OR_DEPARTMENT_OTHER): Payer: Medicare Other

## 2021-08-07 ENCOUNTER — Other Ambulatory Visit: Payer: Self-pay

## 2021-08-07 ENCOUNTER — Encounter (HOSPITAL_BASED_OUTPATIENT_CLINIC_OR_DEPARTMENT_OTHER): Payer: Self-pay | Admitting: *Deleted

## 2021-08-07 ENCOUNTER — Inpatient Hospital Stay (HOSPITAL_BASED_OUTPATIENT_CLINIC_OR_DEPARTMENT_OTHER)
Admission: EM | Admit: 2021-08-07 | Discharge: 2021-08-12 | DRG: 100 | Disposition: A | Discharge status: Home Health | Payer: Medicare Other | Attending: Internal Medicine | Admitting: Internal Medicine

## 2021-08-07 DIAGNOSIS — E1165 Type 2 diabetes mellitus with hyperglycemia: Secondary | ICD-10-CM | POA: Diagnosis present

## 2021-08-07 DIAGNOSIS — Z794 Long term (current) use of insulin: Secondary | ICD-10-CM

## 2021-08-07 DIAGNOSIS — Z7982 Long term (current) use of aspirin: Secondary | ICD-10-CM

## 2021-08-07 DIAGNOSIS — Z87891 Personal history of nicotine dependence: Secondary | ICD-10-CM

## 2021-08-07 DIAGNOSIS — D631 Anemia in chronic kidney disease: Secondary | ICD-10-CM | POA: Diagnosis present

## 2021-08-07 DIAGNOSIS — E1142 Type 2 diabetes mellitus with diabetic polyneuropathy: Secondary | ICD-10-CM | POA: Diagnosis present

## 2021-08-07 DIAGNOSIS — R251 Tremor, unspecified: Secondary | ICD-10-CM

## 2021-08-07 DIAGNOSIS — G40201 Localization-related (focal) (partial) symptomatic epilepsy and epileptic syndromes with complex partial seizures, not intractable, with status epilepticus: Secondary | ICD-10-CM | POA: Diagnosis not present

## 2021-08-07 DIAGNOSIS — Z79899 Other long term (current) drug therapy: Secondary | ICD-10-CM

## 2021-08-07 DIAGNOSIS — N400 Enlarged prostate without lower urinary tract symptoms: Secondary | ICD-10-CM | POA: Diagnosis present

## 2021-08-07 DIAGNOSIS — I120 Hypertensive chronic kidney disease with stage 5 chronic kidney disease or end stage renal disease: Secondary | ICD-10-CM | POA: Diagnosis present

## 2021-08-07 DIAGNOSIS — Z8673 Personal history of transient ischemic attack (TIA), and cerebral infarction without residual deficits: Secondary | ICD-10-CM

## 2021-08-07 DIAGNOSIS — I160 Hypertensive urgency: Secondary | ICD-10-CM | POA: Diagnosis present

## 2021-08-07 DIAGNOSIS — Z20822 Contact with and (suspected) exposure to covid-19: Secondary | ICD-10-CM | POA: Diagnosis present

## 2021-08-07 DIAGNOSIS — I169 Hypertensive crisis, unspecified: Secondary | ICD-10-CM | POA: Diagnosis present

## 2021-08-07 DIAGNOSIS — R569 Unspecified convulsions: Secondary | ICD-10-CM

## 2021-08-07 DIAGNOSIS — N2581 Secondary hyperparathyroidism of renal origin: Secondary | ICD-10-CM | POA: Diagnosis present

## 2021-08-07 DIAGNOSIS — E1122 Type 2 diabetes mellitus with diabetic chronic kidney disease: Secondary | ICD-10-CM | POA: Diagnosis present

## 2021-08-07 DIAGNOSIS — G9389 Other specified disorders of brain: Secondary | ICD-10-CM | POA: Diagnosis present

## 2021-08-07 DIAGNOSIS — I10 Essential (primary) hypertension: Secondary | ICD-10-CM

## 2021-08-07 DIAGNOSIS — N186 End stage renal disease: Secondary | ICD-10-CM | POA: Diagnosis present

## 2021-08-07 DIAGNOSIS — E785 Hyperlipidemia, unspecified: Secondary | ICD-10-CM | POA: Diagnosis present

## 2021-08-07 DIAGNOSIS — Z992 Dependence on renal dialysis: Secondary | ICD-10-CM

## 2021-08-07 DIAGNOSIS — Z888 Allergy status to other drugs, medicaments and biological substances status: Secondary | ICD-10-CM

## 2021-08-07 DIAGNOSIS — E871 Hypo-osmolality and hyponatremia: Secondary | ICD-10-CM | POA: Diagnosis present

## 2021-08-07 DIAGNOSIS — R739 Hyperglycemia, unspecified: Secondary | ICD-10-CM

## 2021-08-07 DIAGNOSIS — Z87898 Personal history of other specified conditions: Secondary | ICD-10-CM

## 2021-08-07 DIAGNOSIS — Z7984 Long term (current) use of oral hypoglycemic drugs: Secondary | ICD-10-CM

## 2021-08-07 LAB — CBC WITH DIFFERENTIAL/PLATELET
Abs Immature Granulocytes: 0.02 10*3/uL (ref 0.00–0.07)
Basophils Absolute: 0.1 10*3/uL (ref 0.0–0.1)
Basophils Relative: 1 %
Eosinophils Absolute: 0.2 10*3/uL (ref 0.0–0.5)
Eosinophils Relative: 4 %
HCT: 35 % — ABNORMAL LOW (ref 39.0–52.0)
Hemoglobin: 12.2 g/dL — ABNORMAL LOW (ref 13.0–17.0)
Immature Granulocytes: 0 %
Lymphocytes Relative: 23 %
Lymphs Abs: 1.2 10*3/uL (ref 0.7–4.0)
MCH: 31.6 pg (ref 26.0–34.0)
MCHC: 34.9 g/dL (ref 30.0–36.0)
MCV: 90.7 fL (ref 80.0–100.0)
Monocytes Absolute: 0.5 10*3/uL (ref 0.1–1.0)
Monocytes Relative: 11 %
Neutro Abs: 3.1 10*3/uL (ref 1.7–7.7)
Neutrophils Relative %: 61 %
Platelets: 158 10*3/uL (ref 150–400)
RBC: 3.86 MIL/uL — ABNORMAL LOW (ref 4.22–5.81)
RDW: 14.7 % (ref 11.5–15.5)
WBC: 5.1 10*3/uL (ref 4.0–10.5)
nRBC: 0 % (ref 0.0–0.2)

## 2021-08-07 LAB — COMPREHENSIVE METABOLIC PANEL
ALT: 16 U/L (ref 0–44)
AST: 18 U/L (ref 15–41)
Albumin: 4.6 g/dL (ref 3.5–5.0)
Alkaline Phosphatase: 70 U/L (ref 38–126)
Anion gap: 16 — ABNORMAL HIGH (ref 5–15)
BUN: 62 mg/dL — ABNORMAL HIGH (ref 8–23)
CO2: 23 mmol/L (ref 22–32)
Calcium: 9.3 mg/dL (ref 8.9–10.3)
Chloride: 95 mmol/L — ABNORMAL LOW (ref 98–111)
Creatinine, Ser: 9.04 mg/dL — ABNORMAL HIGH (ref 0.61–1.24)
GFR, Estimated: 6 mL/min — ABNORMAL LOW (ref >60)
Glucose, Bld: 373 mg/dL — ABNORMAL HIGH (ref 70–99)
Potassium: 4.1 mmol/L (ref 3.5–5.1)
Sodium: 134 mmol/L — ABNORMAL LOW (ref 135–145)
Total Bilirubin: 0.5 mg/dL (ref 0.3–1.2)
Total Protein: 9.1 g/dL — ABNORMAL HIGH (ref 6.5–8.1)

## 2021-08-07 LAB — RESP PANEL BY RT-PCR (FLU A&B, COVID) ARPGX2
Influenza A by PCR: NEGATIVE
Influenza B by PCR: NEGATIVE
SARS Coronavirus 2 by RT PCR: NEGATIVE

## 2021-08-07 LAB — CBG MONITORING, ED
Glucose-Capillary: 348 mg/dL — ABNORMAL HIGH (ref 70–99)
Glucose-Capillary: 317 mg/dL — ABNORMAL HIGH (ref 70–99)

## 2021-08-07 IMAGING — DX DG CHEST 1V PORT
1 series · 1 of 1 positions shown · non-contrast
Comparison: [DATE]

CLINICAL DATA: Right leg twitching.

EXAM:
PORTABLE CHEST 1 VIEW

[chest ap]
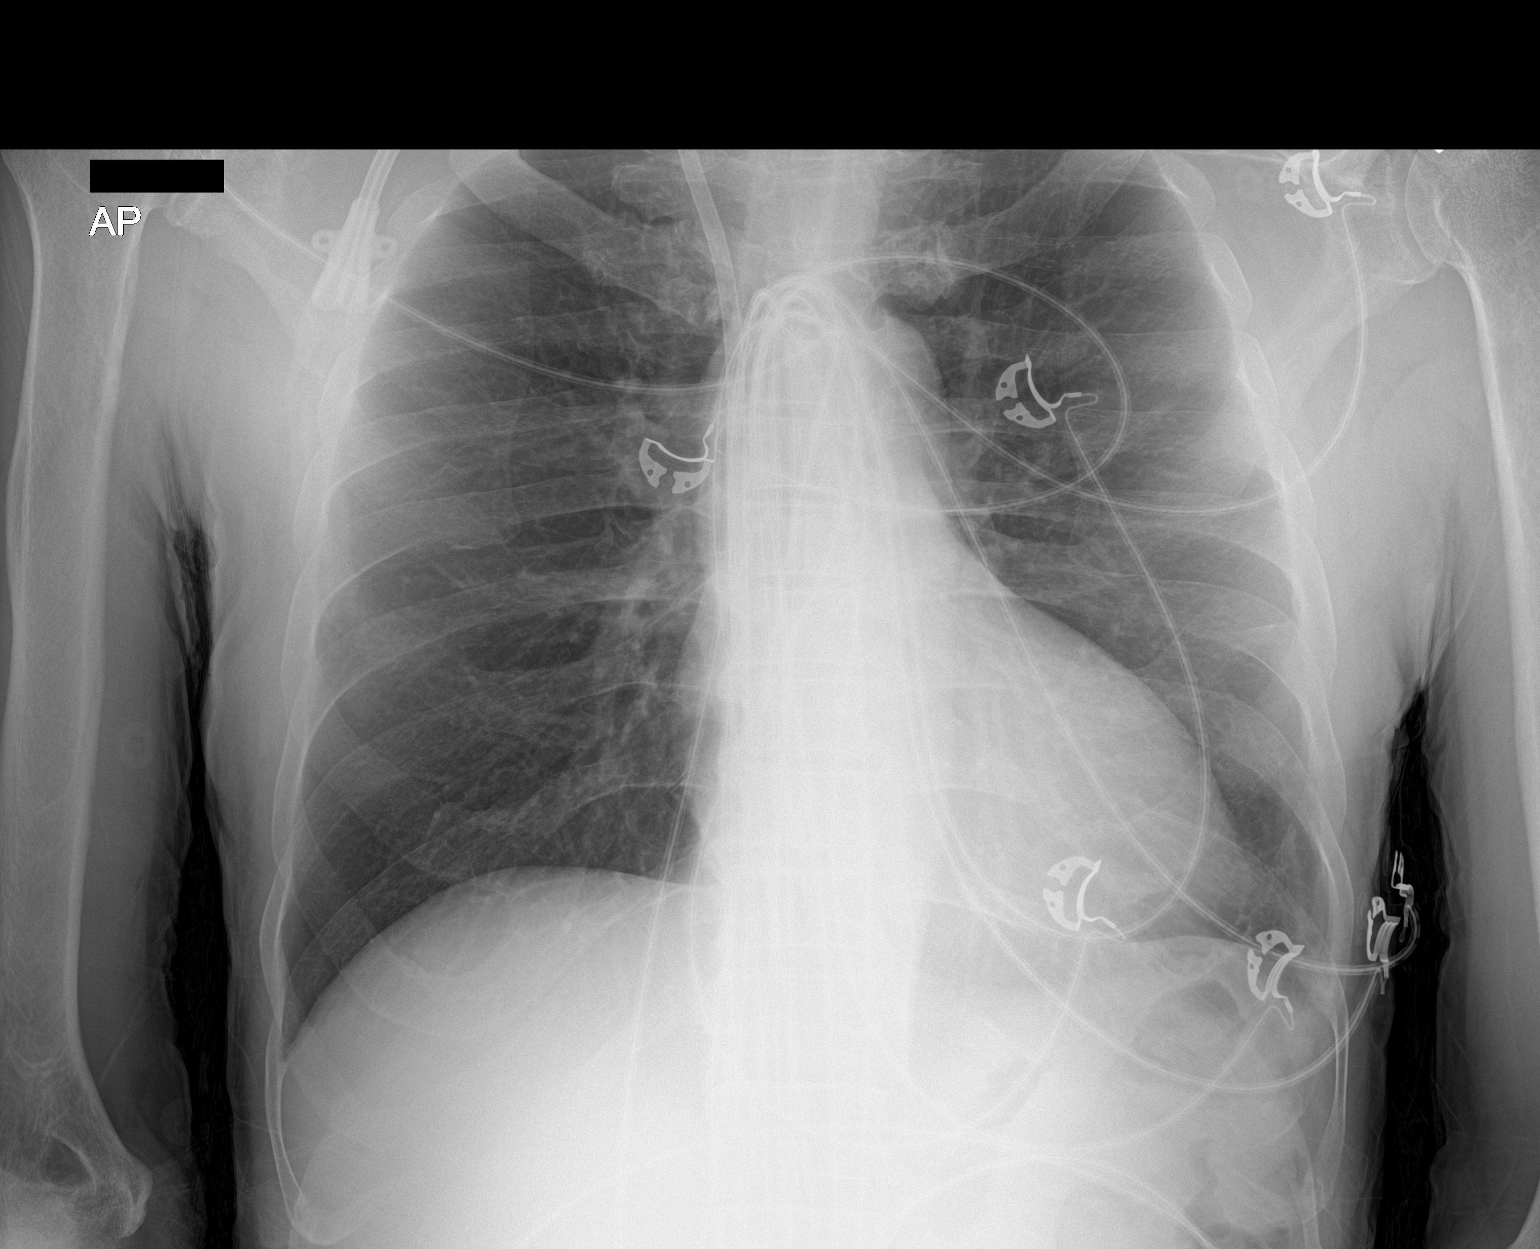

[1 of 1 positions shown; findings below may reference images not displayed]

FINDINGS: There is stable right-sided venous catheter positioning. Multiple
overlying radiopaque cardiac lead wires are seen. The heart size and
mediastinal contours are within normal limits. Both lungs are clear.
The visualized skeletal structures are unremarkable.
IMPRESSION: No active disease.

## 2021-08-07 IMAGING — CT CT HEAD W/O CM
3 series · 15 of 47 positions shown, 18 images · non-contrast
Comparison: CT head [DATE]

CLINICAL DATA: Right leg twitching for 3-4 days.

EXAM:
CT HEAD WITHOUT CONTRAST
TECHNIQUE: Contiguous axial images were obtained from the base of the skull
through the vertex without intravenous contrast.

[Series 2: head wo · axial · 0.46mm/px · z∈[-536,-410]mm · 9 of 31 slices shown, 12 images]
[im 3/31  brain]
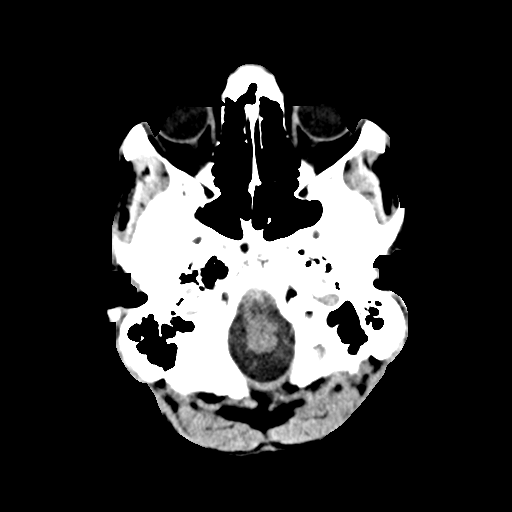
[im 3/31  bone]
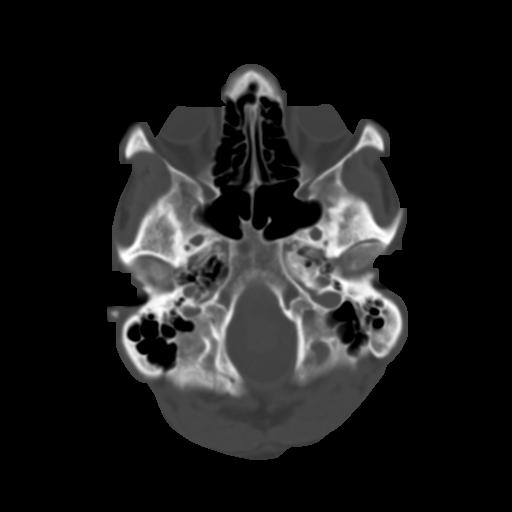
[im 6/31  brain]
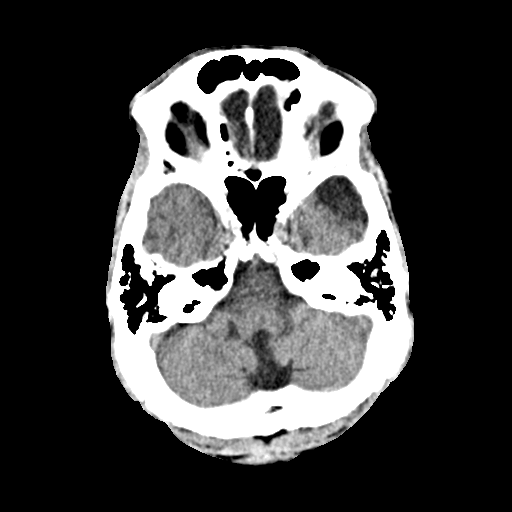
[im 9/31  brain]
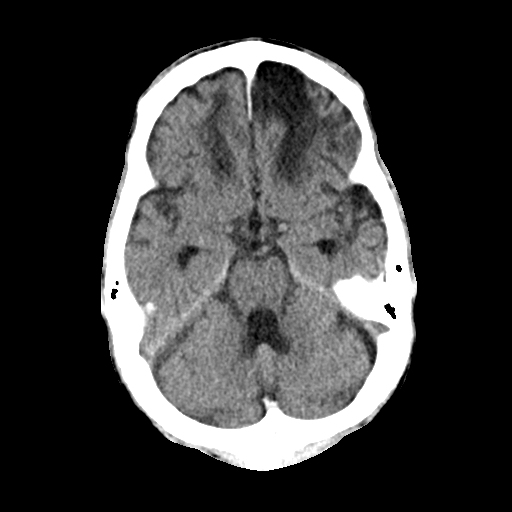
[im 12/31  brain]
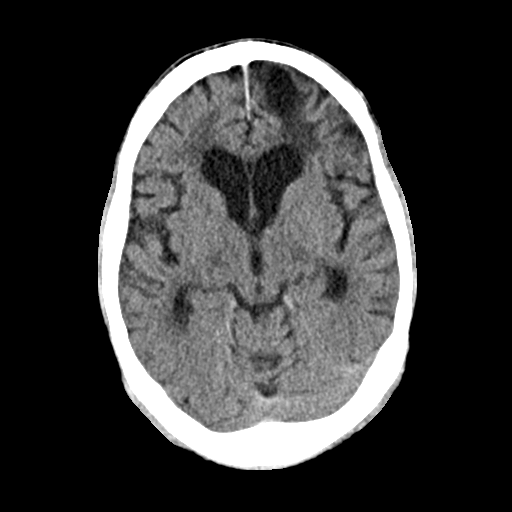
[im 16/31  brain]
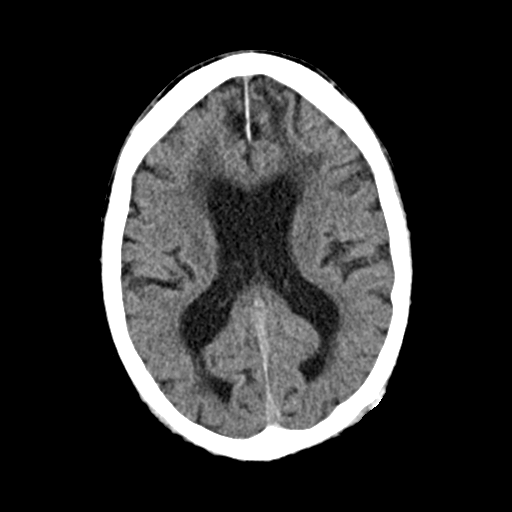
[im 16/31  bone]
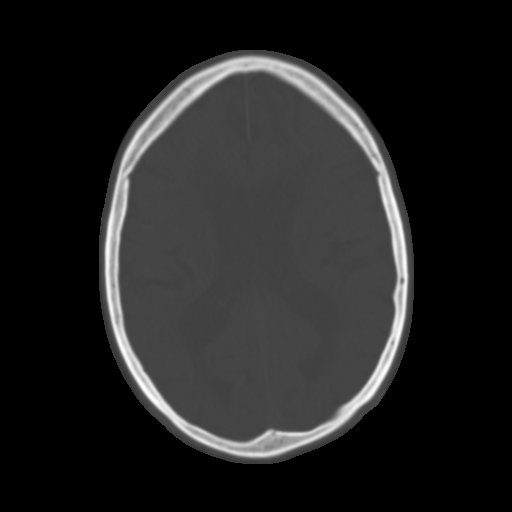
[im 19/31  brain]
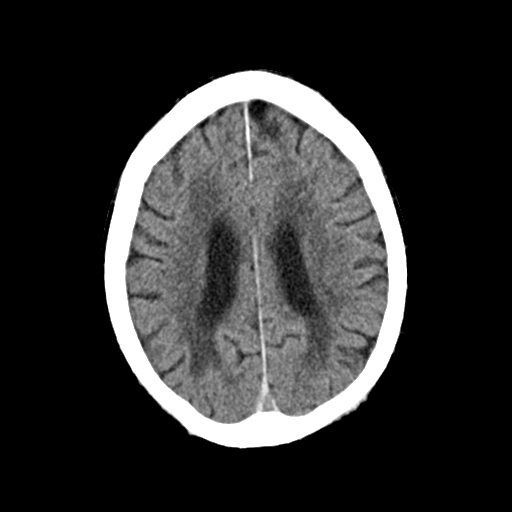
[im 22/31  brain]
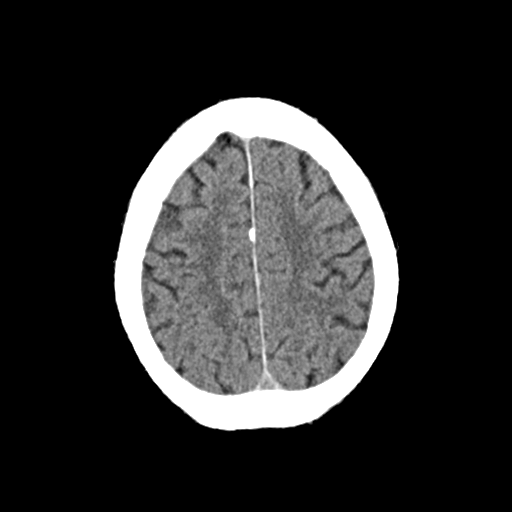
[im 25/31  brain]
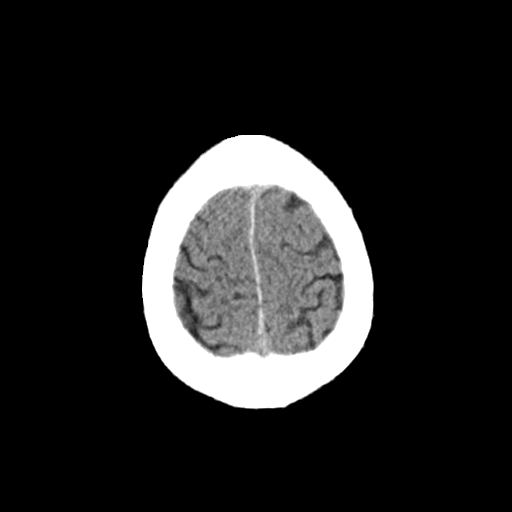
[im 28/31  brain]
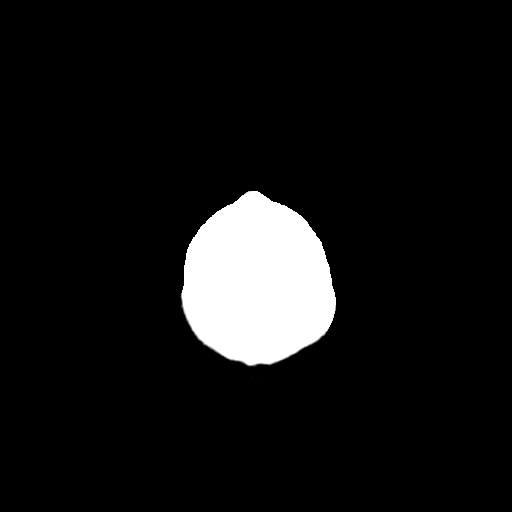
[im 28/31  bone]
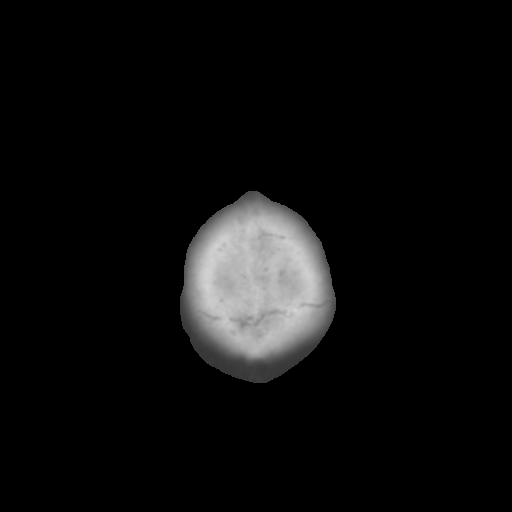

[Series 4: coronal soft · coronal · 0.31mm/px · 3 of 69 slices shown]
[im 23/69  brain]
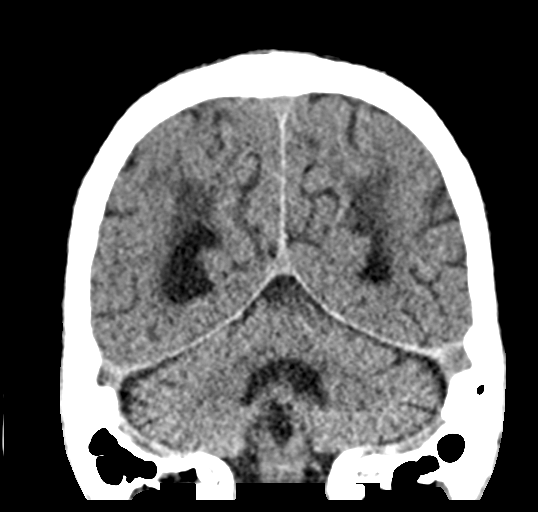
[im 31/69  brain]
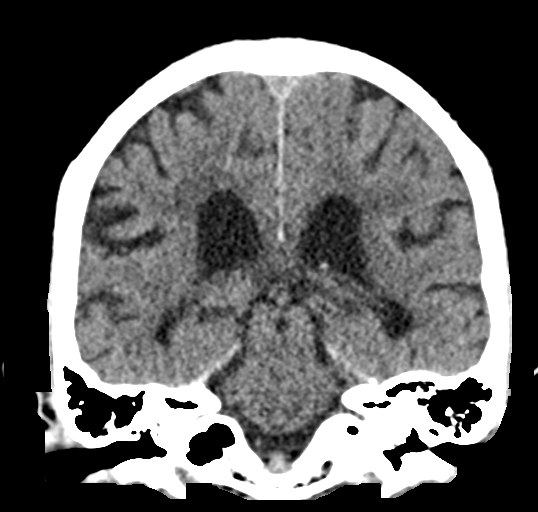
[im 38/69  brain]
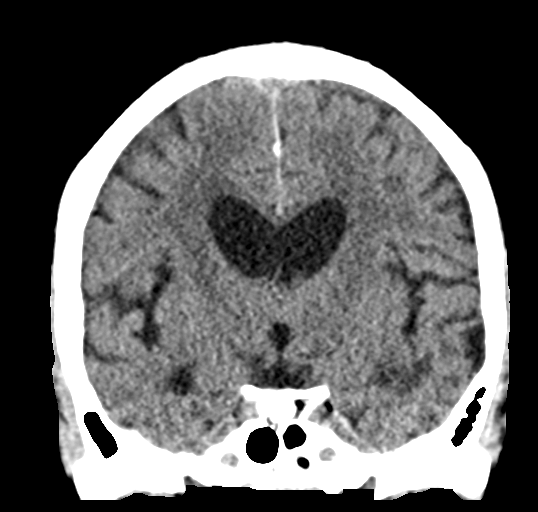

[Series 5: sag soft · sagittal · 0.29mm/px · 3 of 54 slices shown]
[im 18/54  brain]
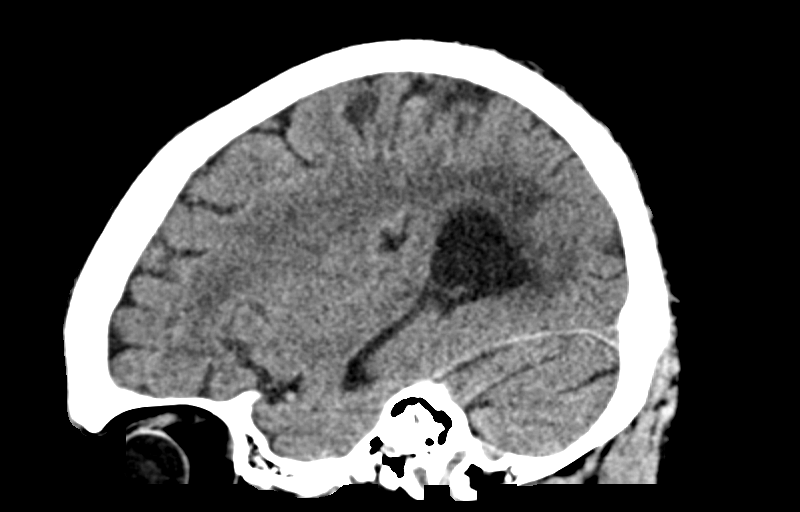
[im 27/54  brain]
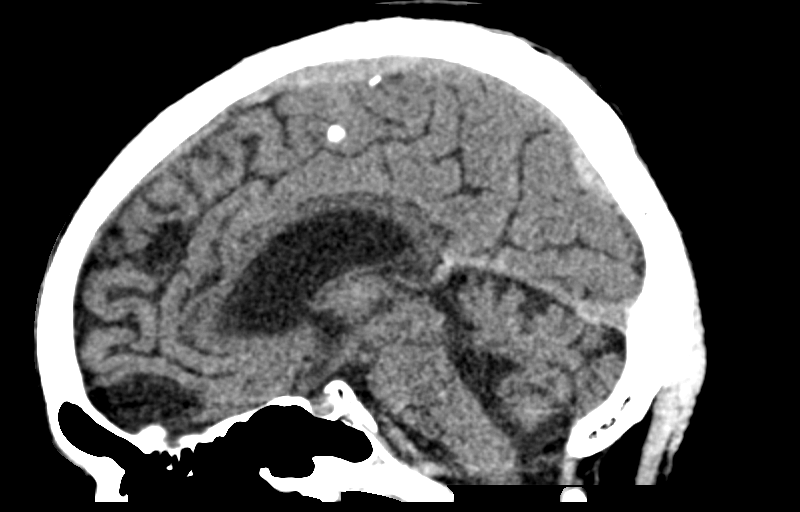
[im 36/54  brain]
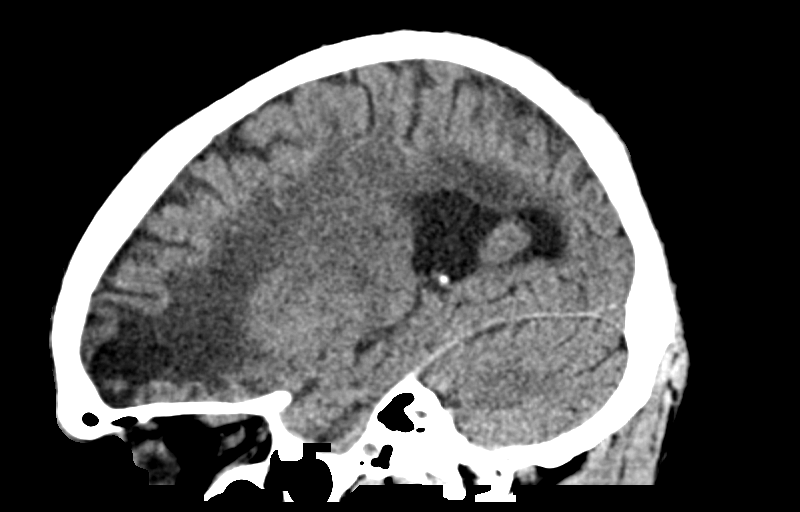

[15 of 47 positions shown; findings below may reference images not displayed]

FINDINGS: Brain: No evidence of acute infarction, hemorrhage, hydrocephalus,
extra-axial collection or mass lesion/mass effect. Unchanged
encephalomalacia in the frontal lobes and left temporal lobe.
Hypodensities in the periventricular and subcortical white matter,
consistent with chronic small vessel ischemic changes. Generalized
parenchymal volume loss with associated enlargement of the lateral
ventricles.

Vascular: No hyperdense vessel or unexpected calcification.

Skull: Normal. Negative for fracture or focal lesion.

Sinuses/Orbits: No acute finding.

Other: None.
IMPRESSION: No acute intracranial abnormality. Stable bifrontal and left
temporal chronic infarcts.

## 2021-08-07 MED ORDER — LABETALOL HCL 5 MG/ML IV SOLN
10.0000 mg | Freq: Once | INTRAVENOUS | Status: AC
  Administered 2021-08-07: 10 mg via INTRAVENOUS
  Filled 2021-08-07: qty 4

## 2021-08-07 MED ORDER — LEVETIRACETAM IN NACL 1000 MG/100ML IV SOLN
1000.0000 mg | Freq: Once | INTRAVENOUS | Status: AC
  Administered 2021-08-07: 1000 mg via INTRAVENOUS
  Filled 2021-08-07: qty 100

## 2021-08-07 NOTE — ED Notes (Signed)
Report called to Charge nurse at [facility] on 6E

## 2021-08-07 NOTE — ED Triage Notes (Signed)
Presents to ED with complaints of rt leg "twitching" for the past 3-4days. Denies any pain. States was seen at [facility] ED and rec medication, but states no relief.

## 2021-08-07 NOTE — ED Provider Notes (Signed)
[facility] EMERGENCY DEPARTMENT Provider Note   CSN: [id number] Arrival date & time: 08/07/21  1529     History Chief Complaint  Patient presents with   Leg Pain    Jonathan Kane is a 75 y.o. male.  HPI     75 year old male with a history of type 2 diabetes, ESRD on dialysis Monday Wednesday Friday, hypertension, substance use disorder, alcohol use disorder, hepatitis C infection, HSV infection, BPH, admission in August for severe hyperglycemia, hypertensive emergency, new onset seizures with status epilepticus requiring intubation and mechanical ventilation and found to have MRSA sepsis secondary to MRSA bacteremia, who presents with concern for right lower extremity shaking.  He had been seen in the emergency department a few days ago for the same, and was treated for hyperglycemia and hypertension on started on a muscle relaxant.  He reports that since Wednesday, he has had episodes of right leg shaking.  They last about 1 minute at a time and resolved.  He reports they are happening about 15 times per day.  Describes uncontrollable shaking in the right lower extremity.  Denies any shaking of his upper extremities or loss of consciousness.  Denies any other acute medical changes other than what have been changed while he has been in the hospital.  Denies any other new numbness, weakness, facial droop, change in vision, headaches, trauma, chest pain or abdominal pain.  He has not had nausea, vomiting, diarrhea.   Past Medical History:  Diagnosis Date   Diabetes mellitus without complication ([place])    End stage kidney disease ([place])    Erectile dysfunction    Hypertension    Vitamin D deficiency     Patient Active Problem List   Diagnosis Date Noted   Seizure ([place]) 08/08/2021   Hypertensive urgency 08/08/2021   Uncontrolled type 2 diabetes mellitus with hyperglycemia ([place]) 08/08/2021   ESRD on hemodialysis ([place]) 08/08/2021   Hypertensive crisis 08/07/2021   MRSA  bacteremia 06/02/2021   Malnutrition of moderate degree 06/01/2021   DKA (diabetic ketoacidosis) ([place]) 05/28/2021   Status epilepticus ([place])    Memory loss 04/13/2021   Cerebrovascular accident (CVA) ([place]) 01/12/2021   DM type 2 with diabetic peripheral neuropathy ([place]) 01/12/2021   Normal anion gap metabolic acidosis 48/54/6270   Secondary hyperparathyroidism of renal origin ([place]) 35/00/9381   Acute metabolic encephalopathy 82/99/3716   Acute on chronic renal failure ([place]) 11/07/2019   CKD (chronic kidney disease) stage 5, GFR less than 15 ml/min (HCC) 09/02/2019   CKD (chronic kidney disease) stage 4, GFR 15-29 ml/min (HCC) 08/01/2019   Paronychia of fifth toe of left foot 05/29/2019   Microalbuminuria due to type 2 diabetes mellitus ([place]) 02/14/2019   Posterior vitreous detachment of both eyes 01/17/2019   Presbyopia of both eyes 01/17/2019   Pseudophakia of both eyes 01/17/2019   Abnormal finding on diagnostic imaging of right testicle 09/14/2018   Epididymitis, left 09/14/2018   Swelling of left testicle 08/23/2018   Primary open angle glaucoma (POAG) of both eyes, mild stage 01/02/2018   Benign hypertension with chronic kidney disease, stage V ([place]) 11/30/2017   Proteinuria 11/30/2017   Mouth burn, initial encounter 11/10/2017   Vitamin D deficiency, unspecified 11/10/2017   Erectile dysfunction associated with type 2 diabetes mellitus ([place]) 08/29/2017   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 ([place]) 08/16/2017   Hypoglycemia 08/16/2017   PCO (posterior capsular opacification), left 03/03/2017   Hypermetropia of both eyes 03/03/2017   Cocaine dependence in  remission ([place]) 07/26/2016   Herpes simplex infection 07/26/2016   CKD stage 4 due to type 2 diabetes mellitus ([place]) 07/07/2016   Noncompliance with diabetes treatment 07/07/2016   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 03/21/2016   Combined arterial insufficiency and corporo-venous occlusive erectile dysfunction  03/04/2016   Allergic rhinitis with postnasal drip 02/24/2016   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   Type 2 diabetes mellitus ([place]) 02/23/2016   Essential hypertension 02/23/2016   Musculoskeletal chest pain 02/23/2016   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[place]) 12/09/2015   Polyneuropathy, unspecified 11/12/2013   Constipation 03/23/2013   Hallucinations 03/23/2013   Polyneuropathy in diabetes ([place]) 03/23/2013   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[place]) 08/30/2011   Diabetes mellitus type II, controlled ([place]) 08/30/2011   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    Past Surgical History:  Procedure Laterality Date   COLONOSCOPY     IR DIALY SHUNT INTRO NEEDLE/INTRACATH INITIAL W/IMG LEFT Left 06/25/2020   IR REMOVAL TUN CV CATH W/O FL  06/03/2021   keloid removed     TEE WITHOUT CARDIOVERSION N/A 06/04/2021   Procedure: TRANSESOPHAGEAL ECHOCARDIOGRAM (TEE);  Surgeon: Skeet Latch, MD;  Location: [facility] ENDOSCOPY;  Service: Cardiovascular;  Laterality: N/A;       Family History  Problem Relation Age of Onset   Ovarian cancer Mother    Other Father        unsure of medical history    Social History   Tobacco Use   Smoking status: Former   Smokeless tobacco: Never  Vaping Use   Vaping Use: Never used  Substance Use Topics   Alcohol use: Yes    Comment: very litte - occasional beer   Drug use: No    Home Medications Prior to Admission medications   Medication Sig Start Date End Date Taking? Authorizing Provider  ACCU-CHEK AVIVA PLUS test strip 1 each by Other route as needed.  04/06/17   [provider]  ACCU-CHEK SOFTCLIX LANCETS lancets CHECK BLOOD SUGAR TWO TO THREE TIMES DAILY 04/05/17   [provider]  acetaminophen (TYLENOL) 325 MG tablet Take 2 tablets (650 mg total) by mouth every 6 (six) hours as needed for fever, mild pain or headache. 06/08/21   Jennye Boroughs, MD   Alcohol Swabs (B-D SINGLE USE SWABS REGULAR) PADS USE TO CHECK BLOOD SUGAR AS DIRECTED 01/26/17   [provider]  amLODipine (NORVASC) 10 MG tablet Take 10 mg by mouth daily. 05/20/20   [provider]  aspirin EC 81 MG tablet Take 81 mg by mouth daily.    [provider]  atorvastatin (LIPITOR) 40 MG tablet Take 40 mg by mouth at bedtime. 05/20/20   [provider]  Blood Glucose Monitoring Suppl (GLUCOCOM BLOOD GLUCOSE MONITOR) DEVI Use as directed.  Dx Code: E11.621 10/26/16   [provider]  calcitRIOL (ROCALTROL) 0.25 MCG capsule Take 0.25 mcg by mouth daily. Patient not taking: Reported on 06/01/2021    [provider]  cholecalciferol (VITAMIN D3) 25 MCG (1000 UNIT) tablet Take 1,000 Units by mouth daily.    [provider]  diltiazem (TIAZAC) 180 MG 24 hr capsule Take 180 mg by mouth daily. 08/04/20   [provider]  hydrALAZINE (APRESOLINE) 25 MG tablet Take 1 tablet (25 mg total) by mouth every 8 (eight) hours. 06/08/21   Jennye Boroughs, MD  Insulin Pen Needle 31G X 8 MM MISC Use as directed per sliding scale.  Dx Code: J19.147  02/03/16   [provider]  latanoprost (XALATAN) 0.005 % ophthalmic solution Place 1 drop into both eyes at bedtime. 05/24/19   [provider]  methocarbamol (ROBAXIN) 500 MG tablet Take 1 tablet (500 mg total) by mouth 2 (two) times daily. 08/04/21   Tedd Sias, PA  sevelamer carbonate (RENVELA) 800 MG tablet Take 800-2,400 mg by mouth See admin instructions. Take 3 tablets (2400 mg) by mouth with meals and 1 tablet (800 mg) with snacks Patient not taking: Reported on 06/01/2021 07/17/20   [provider]  sildenafil (REVATIO) 20 MG tablet Take 40-60 mg by mouth daily as needed (ED).  10/10/19   [provider]  sodium bicarbonate 650 MG tablet Take 1 tablet (650 mg total) by mouth 2 (two) times daily. Patient taking differently: Take 650 mg by mouth daily.  11/08/19   Santos-Sanchez, Merlene Morse, MD  TRADJENTA 5 MG TABS tablet Take 5 mg by mouth daily. 06/16/20   [provider]  TRESIBA FLEXTOUCH 100 UNIT/ML FlexTouch Pen Inject 22 Units into the skin daily. 06/08/21   Jennye Boroughs, MD    Allergies    Dulaglutide  Review of Systems   Review of Systems  Constitutional:  Negative for fever.  HENT:  Negative for sore throat.   Eyes:  Negative for visual disturbance.  Respiratory:  Negative for shortness of breath.   Cardiovascular:  Negative for chest pain.  Gastrointestinal:  Negative for abdominal pain, diarrhea, nausea and vomiting.  Genitourinary:  Negative for difficulty urinating.  Musculoskeletal:  Negative for back pain and neck stiffness.  Skin:  Negative for rash.  Neurological:  Positive for seizures (possible, right leg shaking). Negative for syncope and headaches.   Physical Exam Updated Vital Signs BP (!) 155/92   Pulse 88   Temp 98.5 F (36.9 C) (Oral)   Resp 16   Ht 5\' 10"  (1.778 m)   Wt 65.1 kg   SpO2 99%   BMI 20.59 kg/m   Physical Exam Vitals and nursing note reviewed.  Constitutional:      General: He is not in acute distress.    Appearance: He is well-developed. He is not diaphoretic.  HENT:     Head: Normocephalic and atraumatic.  Eyes:     General: No visual field deficit.    Conjunctiva/sclera: Conjunctivae normal.  Cardiovascular:     Rate and Rhythm: Normal rate and regular rhythm.     Heart sounds: Normal heart sounds. No murmur heard.   No friction rub. No gallop.  Pulmonary:     Effort: Pulmonary effort is normal. No respiratory distress.     Breath sounds: Normal breath sounds. No wheezing or rales.  Abdominal:     General: There is no distension.     Palpations: Abdomen is soft.     Tenderness: There is no abdominal tenderness. There is no guarding.  Musculoskeletal:     Cervical back: Normal range of motion.  Skin:    General: Skin is warm and dry.  Neurological:     Mental  Status: He is alert and oriented to person, place, and time.     GCS: GCS eye subscore is 4. GCS verbal subscore is 5. GCS motor subscore is 6.     Cranial Nerves: No cranial nerve deficit, dysarthria or facial asymmetry.     Sensory: Sensation is intact. No sensory deficit.     Motor: No weakness (had episode of right leg shaking during exam) or tremor.  Coordination: Coordination normal.    ED Results / Procedures / Treatments   Labs (all labs ordered are listed, but only abnormal results are displayed) Labs Reviewed  CBC WITH DIFFERENTIAL/PLATELET - Abnormal; Notable for the following components:      Result Value   RBC 3.86 (*)    Hemoglobin 12.2 (*)    HCT 35.0 (*)    All other components within normal limits  COMPREHENSIVE METABOLIC PANEL - Abnormal; Notable for the following components:   Sodium 134 (*)    Chloride 95 (*)    Glucose, Bld 373 (*)    BUN 62 (*)    Creatinine, Ser 9.04 (*)    Total Protein 9.1 (*)    GFR, Estimated 6 (*)    Anion gap 16 (*)    All other components within normal limits  HEMOGLOBIN A1C - Abnormal; Notable for the following components:   Hgb A1c MFr Bld 10.6 (*)    All other components within normal limits  BASIC METABOLIC PANEL - Abnormal; Notable for the following components:   Sodium 134 (*)    CO2 21 (*)    Glucose, Bld 369 (*)    BUN 59 (*)    Creatinine, Ser 9.78 (*)    GFR, Estimated 5 (*)    All other components within normal limits  GLUCOSE, CAPILLARY - Abnormal; Notable for the following components:   Glucose-Capillary 424 (*)    All other components within normal limits  CBG MONITORING, ED - Abnormal; Notable for the following components:   Glucose-Capillary 348 (*)    All other components within normal limits  CBG MONITORING, ED - Abnormal; Notable for the following components:   Glucose-Capillary 317 (*)    All other components within normal limits  RESP PANEL BY RT-PCR (FLU A&B, COVID) ARPGX2  URINALYSIS, ROUTINE W  REFLEX MICROSCOPIC    EKG EKG Interpretation  Date/Time:  Saturday August 07 2021 18:40:25 EDT Ventricular Rate:  88 PR Interval:  160 QRS Duration: 94 QT Interval:  391 QTC Calculation: 474 R Axis:   80 Text Interpretation: Sinus rhythm Probable left atrial enlargement Low voltage, extremity leads No sig change from earlier tracing Confirmed by Octaviano Glow [phone]) on 08/08/2021 9:08:46 AM  Radiology CT Head Wo Contrast  Result Date: 08/07/2021 CLINICAL DATA:  Right leg twitching for 3-4 days. EXAM: CT HEAD WITHOUT CONTRAST TECHNIQUE: Contiguous axial images were obtained from the base of the skull through the vertex without intravenous contrast. COMPARISON:  CT head 05/28/2021 FINDINGS: Brain: No evidence of acute infarction, hemorrhage, hydrocephalus, extra-axial collection or mass lesion/mass effect. Unchanged encephalomalacia in the frontal lobes and left temporal lobe. Hypodensities in the periventricular and subcortical white matter, consistent with chronic small vessel ischemic changes. Generalized parenchymal volume loss with associated enlargement of the lateral ventricles. Vascular: No hyperdense vessel or unexpected calcification. Skull: Normal. Negative for fracture or focal lesion. Sinuses/Orbits: No acute finding. Other: None. IMPRESSION: No acute intracranial abnormality. 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 Electronically Signed   By: Ileana Roup M.D.   On: 08/07/2021 19:24   MR BRAIN WO CONTRAST  Result Date: 08/08/2021 CLINICAL DATA:  75 year old male with neurologic deficit. EXAM: MRI HEAD WITHOUT CONTRAST TECHNIQUE: Multiplanar, multiecho pulse sequences of the brain and surrounding structures were obtained without intravenous contrast. COMPARISON:  Head CT 08/07/2021.  Brain MRI 06/02/2021 and earlier. FINDINGS: Brain: Fairly extensive chronic bilateral anterior frontal lobe and left temporal lobe encephalomalacia redemonstrated. Mild ex vacuo  ventricular enlargement.  Superimposed chronic confluent bilateral white matter T2 and FLAIR hyperintensity. There is a small area of possible mildly restricted white matter diffusion in the right frontal lobe on series 5, image 97, versus T2 shine through. No other restricted diffusion. No other new signal abnormality. No midline shift, mass effect, evidence of mass lesion, ventriculomegaly, extra-axial collection or acute intracranial hemorrhage. Cervicomedullary junction and pituitary are within normal limits. Little to no chronic cerebral blood products. Deep gray matter nuclei, brainstem and cerebellum remain within normal limits for age. Vascular: Major intracranial vascular flow voids are stable. Skull and upper cervical spine: Stable. Visualized bone marrow signal is within normal limits. Sinuses/Orbits: Stable, negative. Other: Visible internal auditory structures appear normal. Mastoids remain clear. Stable visible scalp and face. IMPRESSION: 1. Questionable small acute to subacute white matter infarct in the right frontal lobe, versus T2-shine-through in the setting of advanced underlying chronic white matter disease. No associated hemorrhage or mass effect. 2. Otherwise continued stable MRI appearance of the brain. 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 Electronically Signed   By: Genevie Ann M.D.   On: 08/08/2021 06:54   DG Chest Portable 1 View  Result Date: 08/07/2021 CLINICAL DATA:  Right leg twitching. EXAM: PORTABLE CHEST 1 VIEW COMPARISON:  June 01, 2021 FINDINGS: There is stable right-sided venous catheter positioning. Multiple overlying radiopaque cardiac lead wires are seen. The heart size and mediastinal contours are within normal limits. Both lungs are clear. The visualized skeletal structures are unremarkable. IMPRESSION: No active disease. Electronically Signed   By: Virgina Norfolk M.D.   On: 08/07/2021 19:23    Procedures Procedures   Medications  Ordered in ED Medications  aspirin EC tablet 81 mg (81 mg Oral Given 08/08/21 0911)  amLODipine (NORVASC) tablet 10 mg (10 mg Oral Given 08/08/21 0910)  atorvastatin (LIPITOR) tablet 40 mg (has no administration in time range)  diltiazem (CARDIZEM CD) 24 hr capsule 180 mg (180 mg Oral Given 08/08/21 0915)  hydrALAZINE (APRESOLINE) tablet 25 mg (25 mg Oral Given 08/08/21 0437)  linagliptin (TRADJENTA) tablet 5 mg (5 mg Oral Given 08/08/21 0911)  insulin glargine-yfgn (SEMGLEE) injection 22 Units (22 Units Subcutaneous Given 08/08/21 0919)  sevelamer carbonate (RENVELA) tablet 2,400 mg (2,400 mg Oral Given 08/08/21 0912)  insulin aspart (novoLOG) injection 0-6 Units (6 Units Subcutaneous Given 08/08/21 0909)  insulin aspart (novoLOG) injection 0-5 Units (has no administration in time range)  heparin injection 5,000 Units (has no administration in time range)  sodium chloride flush (NS) 0.9 % injection 3 mL (3 mLs Intravenous Given 08/08/21 0440)  sodium chloride flush (NS) 0.9 % injection 3 mL (3 mLs Intravenous Given 08/08/21 0933)  0.9 %  sodium chloride infusion (has no administration in time range)  acetaminophen (TYLENOL) tablet 650 mg (has no administration in time range)    Or  acetaminophen (TYLENOL) suppository 650 mg (has no administration in time range)  sevelamer carbonate (RENVELA) tablet 800 mg (has no administration in time range)  senna (SENOKOT) tablet 8.6 mg (has no administration in time range)  levETIRAcetam (KEPPRA) IVPB 1000 mg/100 mL premix (has no administration in time range)  labetalol (NORMODYNE) injection 10 mg (10 mg Intravenous Given 08/07/21 2026)  levETIRAcetam (KEPPRA) IVPB 1000 mg/100 mL premix (0 mg Intravenous Stopped 08/07/21 2047)  insulin aspart (novoLOG) injection 5 Units (5 Units Subcutaneous Given 08/08/21 0438)    ED Course  I have reviewed the triage vital signs and the nursing notes.  Pertinent labs & imaging results that  were available during  my care of the patient were reviewed by me and considered in my medical decision making (see chart for details).    MDM Rules/Calculators/A&P                           75 year old male with a history of type 2 diabetes, ESRD on dialysis Monday Wednesday Friday, hypertension, substance use disorder, alcohol use disorder, hepatitis C infection, HSV infection, BPH, admission in August for severe hyperglycemia, hypertensive emergency, new onset seizures with status epilepticus requiring intubation and mechanical ventilation and found to have MRSA sepsis secondary to MRSA bacteremia, who presents with concern for right lower extremity shaking.    Given history, episodes of right local extremity shaking concerning for focal seizures. Has had significant hyperglycemia, hypertension.  No other signs to suggest hypertensive encephalopathy. No signs of DKA.  Unclear if these focal shaking events may be in setting of hypertension and hyperglycemia. Given labetalol. Consulted Dr. Theda Sers of Neurology. Will admit to the hospitalist with Neurology consult.     Final Clinical Impression(s) / ED Diagnoses Final diagnoses:  Hyperglycemia  Essential hypertension  Shaking  History of seizure    Rx / DC Orders ED Discharge Orders     None        Gareth Morgan, MD 08/08/21 1047

## 2021-08-08 ENCOUNTER — Inpatient Hospital Stay (HOSPITAL_COMMUNITY): Payer: Medicare Other

## 2021-08-08 ENCOUNTER — Encounter (HOSPITAL_COMMUNITY): Payer: Self-pay | Admitting: Family Medicine

## 2021-08-08 ENCOUNTER — Observation Stay (HOSPITAL_COMMUNITY): Payer: Medicare Other

## 2021-08-08 DIAGNOSIS — R569 Unspecified convulsions: Secondary | ICD-10-CM | POA: Diagnosis not present

## 2021-08-08 DIAGNOSIS — I160 Hypertensive urgency: Secondary | ICD-10-CM | POA: Diagnosis present

## 2021-08-08 DIAGNOSIS — E1142 Type 2 diabetes mellitus with diabetic polyneuropathy: Secondary | ICD-10-CM | POA: Diagnosis present

## 2021-08-08 DIAGNOSIS — E785 Hyperlipidemia, unspecified: Secondary | ICD-10-CM | POA: Diagnosis present

## 2021-08-08 DIAGNOSIS — G40201 Localization-related (focal) (partial) symptomatic epilepsy and epileptic syndromes with complex partial seizures, not intractable, with status epilepticus: Secondary | ICD-10-CM | POA: Diagnosis present

## 2021-08-08 DIAGNOSIS — Z8673 Personal history of transient ischemic attack (TIA), and cerebral infarction without residual deficits: Secondary | ICD-10-CM | POA: Diagnosis not present

## 2021-08-08 DIAGNOSIS — Z7984 Long term (current) use of oral hypoglycemic drugs: Secondary | ICD-10-CM | POA: Diagnosis not present

## 2021-08-08 DIAGNOSIS — N2581 Secondary hyperparathyroidism of renal origin: Secondary | ICD-10-CM | POA: Diagnosis present

## 2021-08-08 DIAGNOSIS — Z992 Dependence on renal dialysis: Secondary | ICD-10-CM | POA: Diagnosis not present

## 2021-08-08 DIAGNOSIS — G9389 Other specified disorders of brain: Secondary | ICD-10-CM | POA: Diagnosis present

## 2021-08-08 DIAGNOSIS — Z87898 Personal history of other specified conditions: Secondary | ICD-10-CM | POA: Diagnosis not present

## 2021-08-08 DIAGNOSIS — N186 End stage renal disease: Secondary | ICD-10-CM

## 2021-08-08 DIAGNOSIS — Z7982 Long term (current) use of aspirin: Secondary | ICD-10-CM | POA: Diagnosis not present

## 2021-08-08 DIAGNOSIS — N400 Enlarged prostate without lower urinary tract symptoms: Secondary | ICD-10-CM | POA: Diagnosis present

## 2021-08-08 DIAGNOSIS — E1122 Type 2 diabetes mellitus with diabetic chronic kidney disease: Secondary | ICD-10-CM | POA: Diagnosis present

## 2021-08-08 DIAGNOSIS — Z888 Allergy status to other drugs, medicaments and biological substances status: Secondary | ICD-10-CM | POA: Diagnosis not present

## 2021-08-08 DIAGNOSIS — E1165 Type 2 diabetes mellitus with hyperglycemia: Secondary | ICD-10-CM

## 2021-08-08 DIAGNOSIS — Z20822 Contact with and (suspected) exposure to covid-19: Secondary | ICD-10-CM | POA: Diagnosis present

## 2021-08-08 DIAGNOSIS — E871 Hypo-osmolality and hyponatremia: Secondary | ICD-10-CM | POA: Diagnosis present

## 2021-08-08 DIAGNOSIS — Z79899 Other long term (current) drug therapy: Secondary | ICD-10-CM | POA: Diagnosis not present

## 2021-08-08 DIAGNOSIS — Z794 Long term (current) use of insulin: Secondary | ICD-10-CM | POA: Diagnosis not present

## 2021-08-08 DIAGNOSIS — I120 Hypertensive chronic kidney disease with stage 5 chronic kidney disease or end stage renal disease: Secondary | ICD-10-CM | POA: Diagnosis present

## 2021-08-08 DIAGNOSIS — Z87891 Personal history of nicotine dependence: Secondary | ICD-10-CM | POA: Diagnosis not present

## 2021-08-08 DIAGNOSIS — D631 Anemia in chronic kidney disease: Secondary | ICD-10-CM | POA: Diagnosis present

## 2021-08-08 LAB — BASIC METABOLIC PANEL
Anion gap: 15 (ref 5–15)
BUN: 59 mg/dL — ABNORMAL HIGH (ref 8–23)
CO2: 21 mmol/L — ABNORMAL LOW (ref 22–32)
Calcium: 9.1 mg/dL (ref 8.9–10.3)
Chloride: 98 mmol/L (ref 98–111)
Creatinine, Ser: 9.78 mg/dL — ABNORMAL HIGH (ref 0.61–1.24)
GFR, Estimated: 5 mL/min — ABNORMAL LOW (ref >60)
Glucose, Bld: 369 mg/dL — ABNORMAL HIGH (ref 70–99)
Potassium: 4 mmol/L (ref 3.5–5.1)
Sodium: 134 mmol/L — ABNORMAL LOW (ref 135–145)

## 2021-08-08 LAB — URINALYSIS, ROUTINE W REFLEX MICROSCOPIC
Bacteria, UA: NONE SEEN
Bilirubin Urine: NEGATIVE
Glucose, UA: >=500 mg/dL — AB
Ketones, ur: NEGATIVE mg/dL
Nitrite: NEGATIVE
Protein, ur: 100 mg/dL — AB
Specific Gravity, Urine: 1.011 (ref 1.005–1.030)
pH: 7 (ref 5.0–8.0)

## 2021-08-08 LAB — HEMOGLOBIN A1C
Hgb A1c MFr Bld: 10.6 % — ABNORMAL HIGH (ref 4.8–5.6)
Mean Plasma Glucose: 257.52 mg/dL

## 2021-08-08 LAB — GLUCOSE, CAPILLARY
Glucose-Capillary: 424 mg/dL — ABNORMAL HIGH (ref 70–99)
Glucose-Capillary: 278 mg/dL — ABNORMAL HIGH (ref 70–99)
Glucose-Capillary: 199 mg/dL — ABNORMAL HIGH (ref 70–99)
Glucose-Capillary: 297 mg/dL — ABNORMAL HIGH (ref 70–99)

## 2021-08-08 IMAGING — MR MR HEAD W/O CM
13 of 14 series · 44 of 48 positions shown · non-contrast
Comparison: Head CT [DATE].  Brain MRI [DATE] and earlier.

CLINICAL DATA: 75-year-old male with neurologic deficit.

EXAM:
MRI HEAD WITHOUT CONTRAST
TECHNIQUE: Multiplanar, multiecho pulse sequences of the brain and surrounding
structures were obtained without intravenous contrast.

[Series 5: DWI · axial · 3.0mm · 0.88mm/px · z∈[-143,+11]mm · 7 of 110 slices shown (1 of 4)]
[im 1/110]
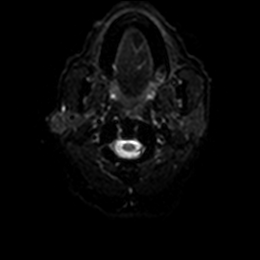
[im 19/110]
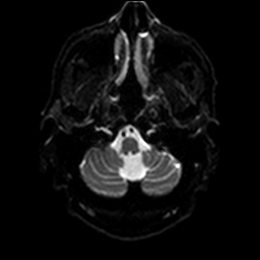
[im 37/110]
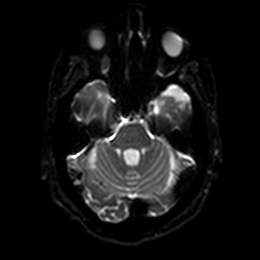
[im 55/110]
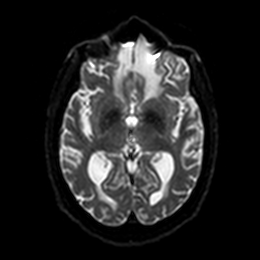
[im 73/110]
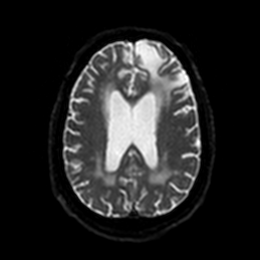
[im 91/110]
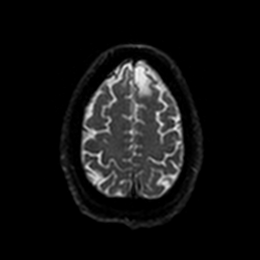
[im 110/110]
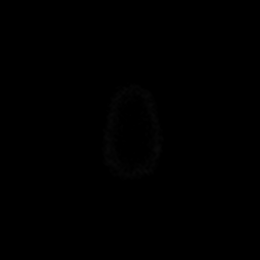

[Series 6: DWI · axial · 3.0mm · 0.88mm/px · z∈[-143,+11]mm · 4 of 55 slices shown (2 of 4)]
[im 1/55]
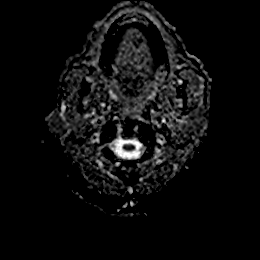
[im 19/55]
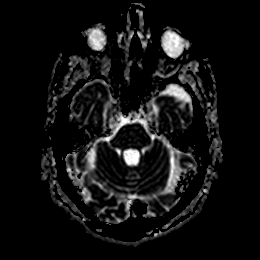
[im 37/55]
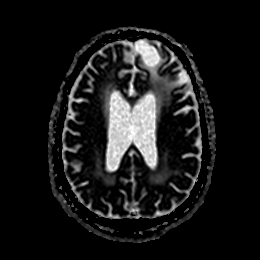
[im 55/55]
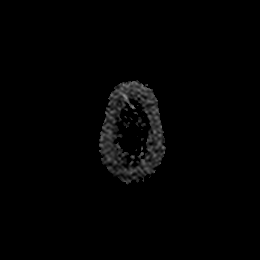

[Series 7: DWI · coronal · 4.0mm · 0.88mm/px · 5 of 72 slices shown (3 of 4)]
[im 1/72]
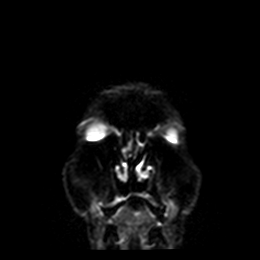
[im 18/72]
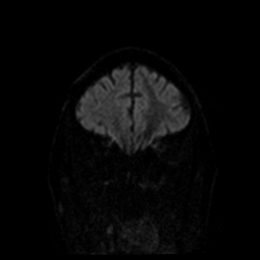
[im 36/72]
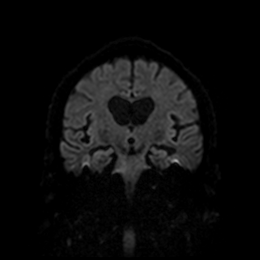
[im 54/72]
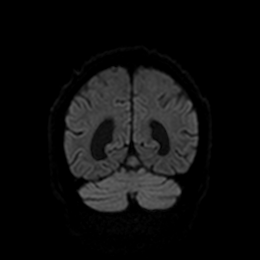
[im 72/72]
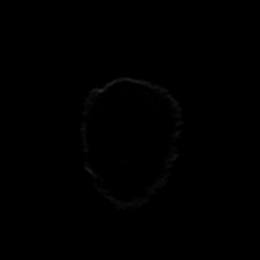

[Series 8: DWI · coronal · 4.0mm · 0.88mm/px · 2 of 36 slices shown (4 of 4)]
[im 1/36]
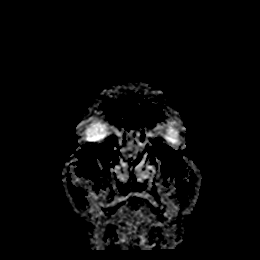
[im 36/36]
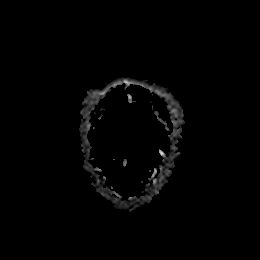

[Series 9: T1 · sagittal · 5.0mm · 0.75mm/px · 2 of 25 slices shown]
[im 1/25]
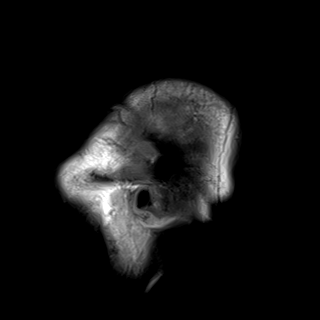
[im 25/25]
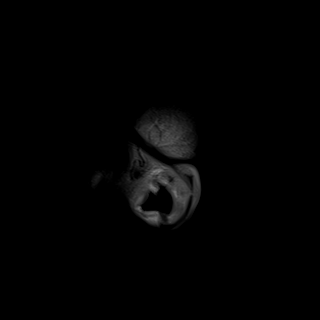

[Series 10: T2 · axial · 5.0mm · 0.72mm/px · z∈[-142,+12]mm · 2 of 28 slices shown (1 of 2)]
[im 1/28]
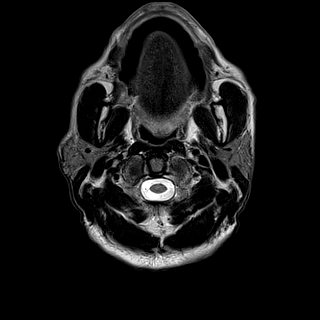
[im 28/28]
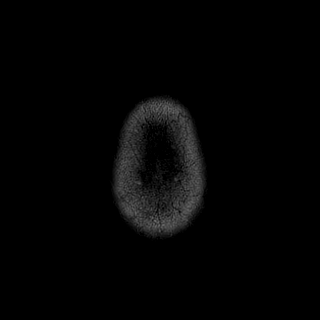

[Series 11: FLAIR · axial · 5.0mm · 0.45mm/px · z∈[-142,+12]mm · 2 of 28 slices shown (1 of 2)]
[im 1/28]
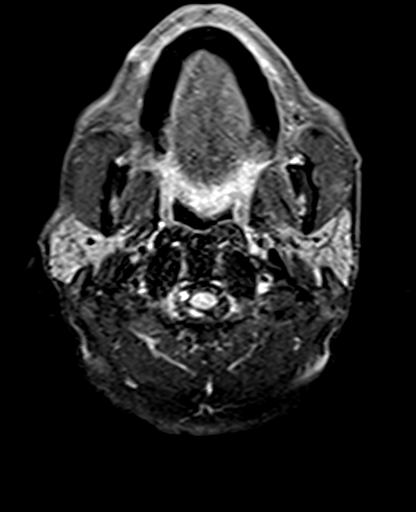
[im 28/28]
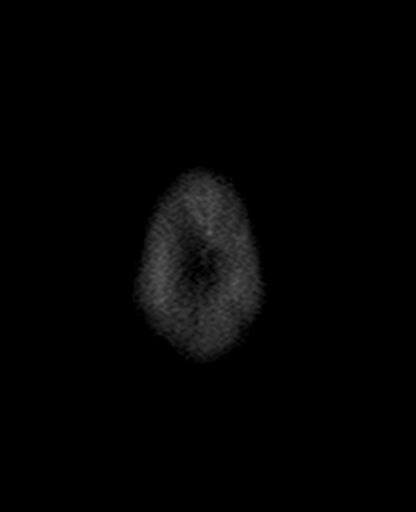

[Series 12: mag_images · axial · 3.0mm · 0.90mm/px · z∈[-155,+13]mm · 4 of 60 slices shown]
[im 1/60]
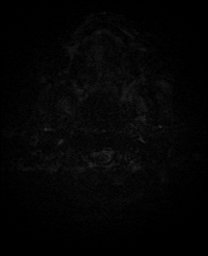
[im 20/60]
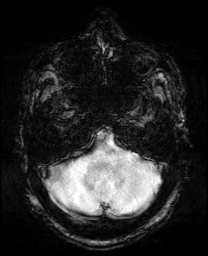
[im 40/60]
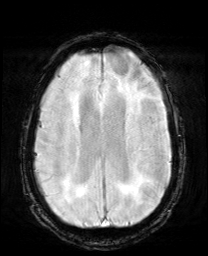
[im 60/60]
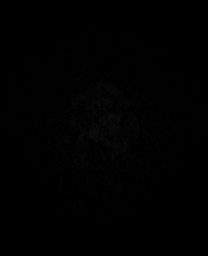

[Series 13: pha_images · axial · 3.0mm · 0.90mm/px · z∈[-155,+5]mm · 4 of 57 slices shown]
[im 1/57]
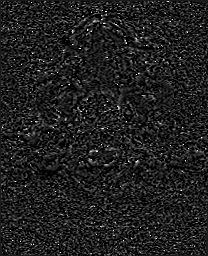
[im 19/57]
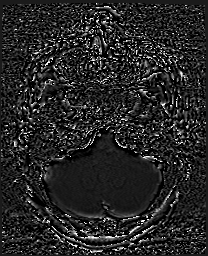
[im 38/57]
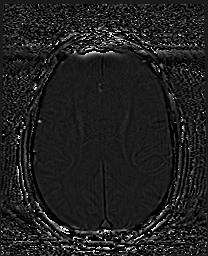
[im 57/57]
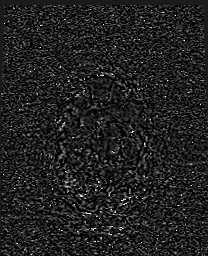

[Series 14: swi_images · axial · 3.0mm · 0.90mm/px · z∈[-155,+13]mm · 4 of 60 slices shown]
[im 1/60]
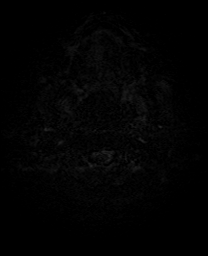
[im 20/60]
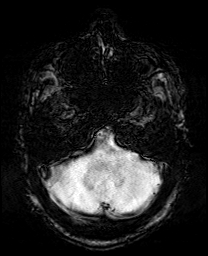
[im 40/60]
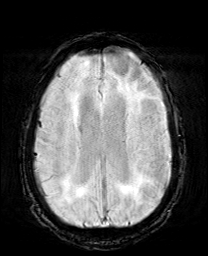
[im 60/60]
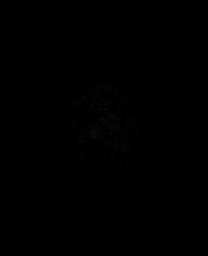

[Series 15: mip_images(sw) · axial · 24.0mm · 0.90mm/px · z∈[-145,+3]mm · 4 of 53 slices shown]
[im 1/53]
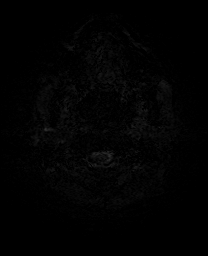
[im 18/53]
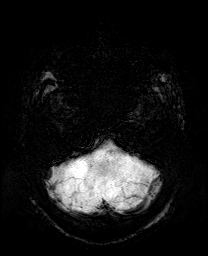
[im 35/53]
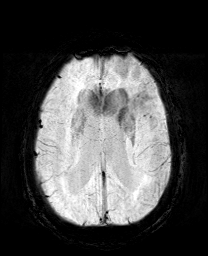
[im 53/53]
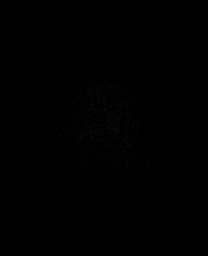

[Series 16: FLAIR · axial · 5.0mm · 0.90mm/px · z∈[-133,+3]mm · 2 of 25 slices shown (2 of 2)]
[im 1/25]
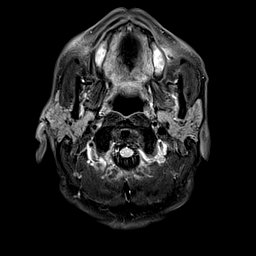
[im 25/25]
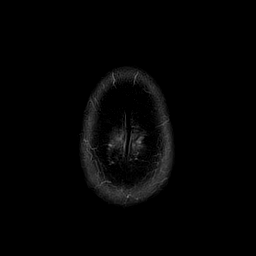

[Series 18: T2 · coronal · 5.0mm · 0.34mm/px · 2 of 32 slices shown (2 of 2)]
[im 1/32]
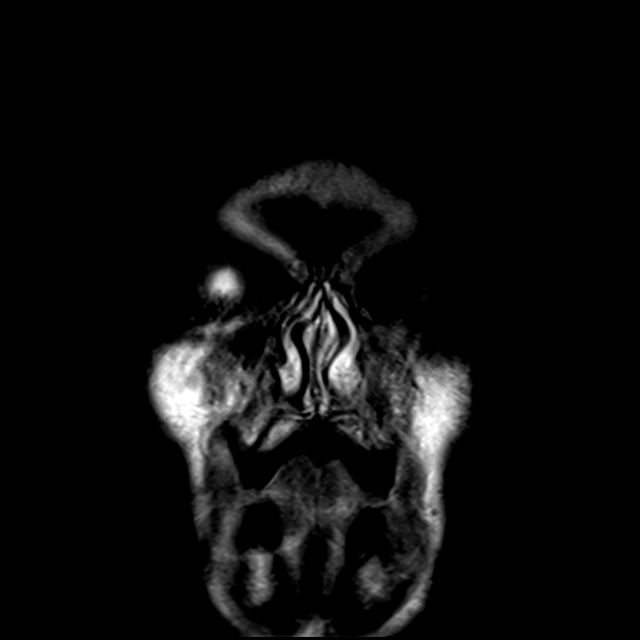
[im 32/32]
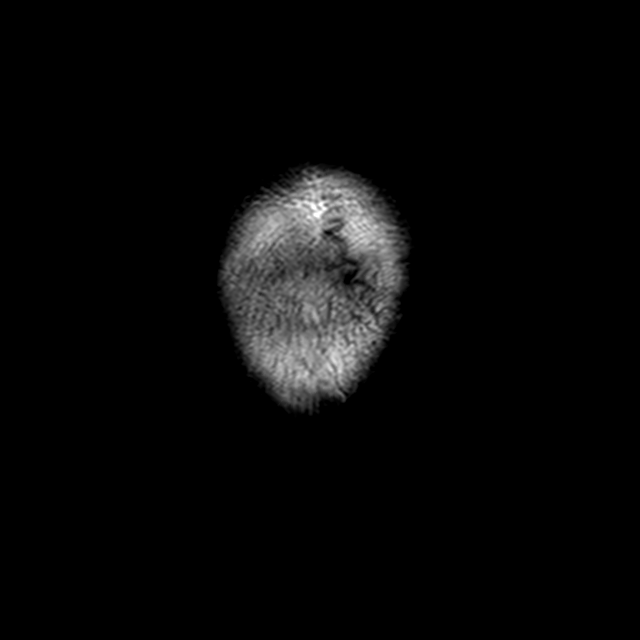

[44 of 48 positions shown; findings below may reference images not displayed]

FINDINGS: Brain: Fairly extensive chronic bilateral anterior frontal lobe and
left temporal lobe encephalomalacia redemonstrated. Mild ex vacuo
ventricular enlargement.

Superimposed chronic confluent bilateral white matter T2 and FLAIR
hyperintensity. There is a small area of possible mildly restricted
white matter diffusion in the right frontal lobe on series 5, image
97, versus T2 shine through. No other restricted diffusion.

No other new signal abnormality. No midline shift, mass effect,
evidence of mass lesion, ventriculomegaly, extra-axial collection or
acute intracranial hemorrhage. Cervicomedullary junction and
pituitary are within normal limits. Little to no chronic cerebral
blood products. Deep gray matter nuclei, brainstem and cerebellum
remain within normal limits for age.

Vascular: Major intracranial vascular flow voids are stable.

Skull and upper cervical spine: Stable. Visualized bone marrow
signal is within normal limits.

Sinuses/Orbits: Stable, negative.

Other: Visible internal auditory structures appear normal. Mastoids
remain clear. Stable visible scalp and face.
IMPRESSION: 1. Questionable small acute to subacute white matter infarct in the
right frontal lobe, versus T2-shine-through in the setting of
advanced underlying chronic white matter disease. No associated
hemorrhage or mass effect.

2. Otherwise continued stable MRI appearance of the brain. Chronic
bilateral frontal and left temporal lobe encephalomalacia.

## 2021-08-08 MED ORDER — INSULIN ASPART 100 UNIT/ML IJ SOLN
5.0000 [IU] | Freq: Once | INTRAMUSCULAR | Status: AC
  Administered 2021-08-08: 5 [IU] via SUBCUTANEOUS

## 2021-08-08 MED ORDER — HEPARIN SODIUM (PORCINE) 5000 UNIT/ML IJ SOLN
5000.0000 [IU] | Freq: Three times a day (TID) | INTRAMUSCULAR | Status: DC
  Administered 2021-08-08 – 2021-08-12 (×11): 5000 [IU] via SUBCUTANEOUS
  Filled 2021-08-08 (×12): qty 1

## 2021-08-08 MED ORDER — CHLORHEXIDINE GLUCONATE CLOTH 2 % EX PADS
6.0000 | MEDICATED_PAD | Freq: Every day | CUTANEOUS | Status: DC
  Administered 2021-08-08 – 2021-08-12 (×5): 6 via TOPICAL

## 2021-08-08 MED ORDER — LINAGLIPTIN 5 MG PO TABS
5.0000 mg | ORAL_TABLET | Freq: Every day | ORAL | Status: DC
  Administered 2021-08-08 – 2021-08-12 (×5): 5 mg via ORAL
  Filled 2021-08-08 (×5): qty 1

## 2021-08-08 MED ORDER — ATORVASTATIN CALCIUM 40 MG PO TABS
40.0000 mg | ORAL_TABLET | Freq: Every day | ORAL | Status: DC
  Administered 2021-08-08 – 2021-08-11 (×4): 40 mg via ORAL
  Filled 2021-08-08 (×4): qty 1

## 2021-08-08 MED ORDER — SODIUM CHLORIDE 0.9% FLUSH
3.0000 mL | INTRAVENOUS | Status: DC | PRN
  Administered 2021-08-08: 3 mL via INTRAVENOUS

## 2021-08-08 MED ORDER — INSULIN GLARGINE-YFGN 100 UNIT/ML ~~LOC~~ SOLN
22.0000 [IU] | Freq: Every day | SUBCUTANEOUS | Status: DC
  Administered 2021-08-08 – 2021-08-10 (×3): 22 [IU] via SUBCUTANEOUS
  Filled 2021-08-08 (×3): qty 0.22

## 2021-08-08 MED ORDER — LEVETIRACETAM IN NACL 1000 MG/100ML IV SOLN
1000.0000 mg | Freq: Two times a day (BID) | INTRAVENOUS | Status: DC
  Administered 2021-08-08: 1000 mg via INTRAVENOUS
  Filled 2021-08-08: qty 100

## 2021-08-08 MED ORDER — INSULIN ASPART 100 UNIT/ML IJ SOLN
0.0000 [IU] | Freq: Every day | INTRAMUSCULAR | Status: DC
  Administered 2021-08-08: 3 [IU] via SUBCUTANEOUS
  Administered 2021-08-09 – 2021-08-11 (×2): 2 [IU] via SUBCUTANEOUS

## 2021-08-08 MED ORDER — SODIUM CHLORIDE 0.9 % IV SOLN
250.0000 mL | INTRAVENOUS | Status: DC | PRN
  Administered 2021-08-08 – 2021-08-10 (×3): 250 mL via INTRAVENOUS

## 2021-08-08 MED ORDER — ACETAMINOPHEN 650 MG RE SUPP: 650.0000 mg | Freq: Four times a day (QID) | RECTAL | Status: DC | PRN

## 2021-08-08 MED ORDER — SENNA 8.6 MG PO TABS: 1.0000 | ORAL_TABLET | Freq: Every day | ORAL | Status: DC | PRN

## 2021-08-08 MED ORDER — LEVETIRACETAM IN NACL 1500 MG/100ML IV SOLN
1500.0000 mg | Freq: Two times a day (BID) | INTRAVENOUS | Status: DC
  Administered 2021-08-08 – 2021-08-10 (×5): 1500 mg via INTRAVENOUS
  Filled 2021-08-08 (×7): qty 100

## 2021-08-08 MED ORDER — INSULIN ASPART 100 UNIT/ML IJ SOLN
0.0000 [IU] | Freq: Three times a day (TID) | INTRAMUSCULAR | Status: DC
  Administered 2021-08-08: 1 [IU] via SUBCUTANEOUS
  Administered 2021-08-08: 3 [IU] via SUBCUTANEOUS
  Administered 2021-08-08: 6 [IU] via SUBCUTANEOUS
  Administered 2021-08-09: 2 [IU] via SUBCUTANEOUS
  Administered 2021-08-10: 1 [IU] via SUBCUTANEOUS
  Administered 2021-08-10: 3 [IU] via SUBCUTANEOUS
  Administered 2021-08-12 (×2): 1 [IU] via SUBCUTANEOUS

## 2021-08-08 MED ORDER — DILTIAZEM HCL ER COATED BEADS 180 MG PO CP24
180.0000 mg | ORAL_CAPSULE | Freq: Every day | ORAL | Status: DC
  Administered 2021-08-08 – 2021-08-12 (×4): 180 mg via ORAL
  Filled 2021-08-08 (×5): qty 1

## 2021-08-08 MED ORDER — SODIUM CHLORIDE 0.9% FLUSH
3.0000 mL | Freq: Two times a day (BID) | INTRAVENOUS | Status: DC
  Administered 2021-08-08 – 2021-08-12 (×9): 3 mL via INTRAVENOUS

## 2021-08-08 MED ORDER — SEVELAMER CARBONATE 800 MG PO TABS
2400.0000 mg | ORAL_TABLET | Freq: Three times a day (TID) | ORAL | Status: DC
  Administered 2021-08-08 – 2021-08-12 (×11): 2400 mg via ORAL
  Filled 2021-08-08 (×11): qty 3

## 2021-08-08 MED ORDER — AMLODIPINE BESYLATE 10 MG PO TABS
10.0000 mg | ORAL_TABLET | Freq: Every day | ORAL | Status: DC
  Administered 2021-08-08 – 2021-08-12 (×4): 10 mg via ORAL
  Filled 2021-08-08 (×5): qty 1

## 2021-08-08 MED ORDER — LEVETIRACETAM IN NACL 500 MG/100ML IV SOLN
500.0000 mg | Freq: Once | INTRAVENOUS | Status: AC
  Administered 2021-08-08: 500 mg via INTRAVENOUS
  Filled 2021-08-08: qty 100

## 2021-08-08 MED ORDER — SEVELAMER CARBONATE 800 MG PO TABS: 800.0000 mg | ORAL_TABLET | ORAL | Status: DC | PRN

## 2021-08-08 MED ORDER — HYDRALAZINE HCL 25 MG PO TABS
25.0000 mg | ORAL_TABLET | Freq: Three times a day (TID) | ORAL | Status: DC
  Administered 2021-08-08 – 2021-08-12 (×11): 25 mg via ORAL
  Filled 2021-08-08 (×13): qty 1

## 2021-08-08 MED ORDER — CHLORHEXIDINE GLUCONATE CLOTH 2 % EX PADS
6.0000 | MEDICATED_PAD | Freq: Every day | CUTANEOUS | Status: DC
  Administered 2021-08-09 – 2021-08-12 (×2): 6 via TOPICAL

## 2021-08-08 MED ORDER — LEVETIRACETAM IN NACL 500 MG/100ML IV SOLN
500.0000 mg | INTRAVENOUS | Status: DC
  Filled 2021-08-08: qty 100

## 2021-08-08 MED ORDER — ASPIRIN EC 81 MG PO TBEC
81.0000 mg | DELAYED_RELEASE_TABLET | Freq: Every day | ORAL | Status: DC
  Administered 2021-08-08 – 2021-08-12 (×5): 81 mg via ORAL
  Filled 2021-08-08 (×5): qty 1

## 2021-08-08 MED ORDER — ACETAMINOPHEN 325 MG PO TABS: 650.0000 mg | ORAL_TABLET | Freq: Four times a day (QID) | ORAL | Status: DC | PRN

## 2021-08-08 NOTE — Consult Note (Signed)
Renal Service Consult Note [facility]  Jonathan Kane 08/08/2021 Sol Blazing, MD Requesting Physician: Dr. Pietro Cassis  Reason for Consult: ESRD pt w/ seizure-like episodes HPI: The patient is a 75 y.o. year-old w/ hx of seizure d/o, ESRD on HD, DM2, HTN, HL who presented to ED on 10/22 w/ c/o twitching movements of the R leg, not able to control.  Some mental fogginess as well. In ED BP's were high about 170/105 , Na 134 , glu 373.  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 Pt was admitted, neurology consulted. We are asked to see for ESRD.    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   Seen by neurology.    ROS - denies CP, no joint pain, no HA, no blurry vision, no rash, no diarrhea, no nausea/ vomiting   Past Medical History  Past Medical History:  Diagnosis Date   Diabetes mellitus without complication ([place])    End stage kidney disease ([place])    Erectile dysfunction    Hypertension    Vitamin D deficiency    Past Surgical History  Past Surgical History:  Procedure Laterality Date   COLONOSCOPY     IR DIALY SHUNT INTRO NEEDLE/INTRACATH INITIAL W/IMG LEFT Left 06/25/2020   IR REMOVAL TUN CV CATH W/O FL  06/03/2021   keloid removed     TEE WITHOUT CARDIOVERSION N/A 06/04/2021   Procedure: TRANSESOPHAGEAL ECHOCARDIOGRAM (TEE);  Surgeon: Skeet Latch, MD;  Location: [facility] ENDOSCOPY;  Service: Cardiovascular;  Laterality: N/A;   Family History  Family History  Problem Relation Age of Onset   Ovarian cancer Mother    Other Father        unsure of medical history   Social History  reports that he has quit smoking. He has never used smokeless tobacco. He reports current alcohol use. He reports that he does not use drugs. Allergies  Allergies  Allergen Reactions   Dulaglutide     TRULICITY:  GI Upset intolerance   Home medications Prior to Admission  medications   Medication Sig Start Date End Date Taking? Authorizing Provider  ACCU-CHEK AVIVA PLUS test strip 1 each by Other route as needed.  04/06/17   [provider]  ACCU-CHEK SOFTCLIX LANCETS lancets CHECK BLOOD SUGAR TWO TO THREE TIMES DAILY 04/05/17   [provider]  acetaminophen (TYLENOL) 325 MG tablet Take 2 tablets (650 mg total) by mouth every 6 (six) hours as needed for fever, mild pain or headache. 06/08/21   Jennye Boroughs, MD  Alcohol Swabs (B-D SINGLE USE SWABS REGULAR) PADS USE TO CHECK BLOOD SUGAR AS DIRECTED 01/26/17   [provider]  amLODipine (NORVASC) 10 MG tablet Take 10 mg by mouth daily. 05/20/20   [provider]  aspirin EC 81 MG tablet Take 81 mg by mouth daily.    [provider]  atorvastatin (LIPITOR) 40 MG tablet Take 40 mg by mouth at bedtime. 05/20/20   [provider]  Blood Glucose Monitoring Suppl (GLUCOCOM BLOOD GLUCOSE MONITOR) DEVI Use as directed.  Dx Code: E11.621 10/26/16   [provider]  calcitRIOL (ROCALTROL) 0.25 MCG capsule Take 0.25 mcg by mouth daily. Patient not taking: Reported on 06/01/2021    [provider]  cholecalciferol (VITAMIN D3) 25 MCG (1000 UNIT) tablet Take 1,000 Units by mouth daily.    [provider]  diltiazem (TIAZAC) 180 MG 24  hr capsule Take 180 mg by mouth daily. 08/04/20   [provider]  hydrALAZINE (APRESOLINE) 25 MG tablet Take 1 tablet (25 mg total) by mouth every 8 (eight) hours. 06/08/21   Jennye Boroughs, MD  Insulin Pen Needle 31G X 8 MM MISC Use as directed per sliding scale.  Dx Code: E11.621 02/03/16   [provider]  latanoprost (XALATAN) 0.005 % ophthalmic solution Place 1 drop into both eyes at bedtime. 05/24/19   [provider]  methocarbamol (ROBAXIN) 500 MG tablet Take 1 tablet (500 mg total) by mouth 2 (two) times daily. 08/04/21   Tedd Sias, PA  sevelamer carbonate (RENVELA) 800 MG tablet Take  800-2,400 mg by mouth See admin instructions. Take 3 tablets (2400 mg) by mouth with meals and 1 tablet (800 mg) with snacks Patient not taking: Reported on 06/01/2021 07/17/20   [provider]  sildenafil (REVATIO) 20 MG tablet Take 40-60 mg by mouth daily as needed (ED).  10/10/19   [provider]  sodium bicarbonate 650 MG tablet Take 1 tablet (650 mg total) by mouth 2 (two) times daily. Patient taking differently: Take 650 mg by mouth daily. 11/08/19   Santos-Sanchez, Merlene Morse, MD  TRADJENTA 5 MG TABS tablet Take 5 mg by mouth daily. 06/16/20   [provider]  TRESIBA FLEXTOUCH 100 UNIT/ML FlexTouch Pen Inject 22 Units into the skin daily. 06/08/21   Jennye Boroughs, MD     Vitals:   08/08/21 0444 08/08/21 0714 08/08/21 0915 08/08/21 1145  BP: (!) 145/97 (!) 149/85 (!) 155/92 121/79  Pulse: 87 88    Resp: 18 16  16   Temp: 98.5 F (36.9 C) 98.5 F (36.9 C)  98.2 F (36.8 C)  TempSrc: Oral Oral  Oral  SpO2: 99% 99%    Weight:      Height:       Exam Gen alert, no distress No rash, cyanosis or gangrene Sclera anicteric, throat clear  No jvd or bruits Chest clear bilat to bases, no rales/ wheezing RRR no MRG Abd soft ntnd no mass or ascites +bs GU normal MS no joint effusions or deformity Ext no LE or UE edema, no wounds or ulcers Neuro is alert, Ox 3 , nf, no jerking or tremors or asterixis  RIJ TDC intact     Home meds include - norvasc 10, lipitor, diltiazem 180 qd, hydralazine 25 tid, insulin sq, renvela 3 ac, sod bicarb 1 bid, tradjenta , prn's/ vit/ supp    [facility] HD: [facility] M[facility]  3h 48min   400/1.5   65.5kg  2/2.5 bath  Heparin 2500    - venofer 50 weekly  - mircera 75 ug q2, last 10/21   Assessment/ Plan: HTN'sive urgency - BP's better today.  R/o seizures - R leg twitching at home, neurology consulting ESRD - on HD MWF. HD tomorrow on schedule.  HTN/ volume - at dry wt, no vol excess on exam. BP's down w/ medications. UF 1-1.5 L w/ HD  tomorrow, possibly will need to lower dry wt.  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

## 2021-08-08 NOTE — Procedures (Signed)
Patient Name: Jonathan Kane  MRN: [id number]  Epilepsy Attending: Lora Havens  Referring Physician/Provider: Dr Terrilee Croak Date: 08/08/2021 Duration: 23.04 mins  Patient history: 75 y.o. male with PMHx DM2 (nonadherent), ESRD on HD M/W/F, HTN (uncontrolled) with seizure-like activity in the right lower extremity without loss of awareness. EEG to evaluate for seizure.  Level of alertness: Awake  AEDs during EEG study:  LEV  Technical aspects: This EEG study was done with scalp electrodes positioned according to the 10-20 International system of electrode placement. Electrical activity was acquired at a sampling rate of 500Hz  and reviewed with a high frequency filter of 70Hz  and a low frequency filter of 1Hz . EEG data were recorded continuously and digitally stored.   Description: The posterior dominant rhythm consists of 9 Hz activity of moderate voltage (25-35 uV) seen predominantly in posterior head regions, symmetric and reactive to eye opening and eye closing.  Hyperventilation and photic stimulation were not performed.     IMPRESSION: This study is within normal limits. No seizures or epileptiform discharges were seen throughout the recording.  Juda Lajeunesse [name]

## 2021-08-08 NOTE — Progress Notes (Signed)
EEG complete - results pending 

## 2021-08-08 NOTE — Consult Note (Signed)
Neurology Consult H&P  Jonathan Kane MR# [id number] 08/08/2021   CC: leg twitching -suspicion for focal aware seizure  History is obtained from: patient and chart.  HPI: Jonathan Kane is a 75 y.o. male PMHx as reviewed below, DM2 (nonadherent), ESRD on HD M/W/F, HTN (uncontrolled) transferred from OSH after presenting with complaints of right leg twitching for the past 3 to 4 days.  He characterizes these events as uncontrollable and involuntarily occurring "tremors/movement" which last 1 to 2 minutes then completely resolve.  During this episodes he feels as if his thinking is cloudy or foggy and he has a difficult time answering questions however without complete loss of awareness.  He describes these events as uncontrollable trembling starting in his right distal lower extremity which evolves proximally into his right lower quadrant.  He denies associated pain and trialed muscle relaxant without relief.  He does live with 1 family member who can no longer care for him and object of transfer is to evaluate for possible seizure and engage social services to evaluate home situation.  ROS: A complete ROS was performed and is negative except as noted in the HPI.  Past Medical History:  Diagnosis Date   Diabetes mellitus without complication ([place])    End stage kidney disease ([facility])    Erectile dysfunction    Hypertension    Vitamin D deficiency      Family History  Problem Relation Age of Onset   Ovarian cancer Mother    Other Father        unsure of medical history    Social History:  reports that he has quit smoking. He has never used smokeless tobacco. He reports current alcohol use. He reports that he does not use drugs.   Prior to Admission medications   Medication Sig Start Date End Date Taking? Authorizing Provider  ACCU-CHEK AVIVA PLUS test strip 1 each by Other route as needed.  04/06/17   [provider]  ACCU-CHEK SOFTCLIX LANCETS lancets CHECK BLOOD  SUGAR TWO TO THREE TIMES DAILY 04/05/17   [provider]  acetaminophen (TYLENOL) 325 MG tablet Take 2 tablets (650 mg total) by mouth every 6 (six) hours as needed for fever, mild pain or headache. 06/08/21   Jennye Boroughs, MD  Alcohol Swabs (B-D SINGLE USE SWABS REGULAR) PADS USE TO CHECK BLOOD SUGAR AS DIRECTED 01/26/17   [provider]  amLODipine (NORVASC) 10 MG tablet Take 10 mg by mouth daily. 05/20/20   [provider]  aspirin EC 81 MG tablet Take 81 mg by mouth daily.    [provider]  atorvastatin (LIPITOR) 40 MG tablet Take 40 mg by mouth at bedtime. 05/20/20   [provider]  Blood Glucose Monitoring Suppl (GLUCOCOM BLOOD GLUCOSE MONITOR) DEVI Use as directed.  Dx Code: E11.621 10/26/16   [provider]  calcitRIOL (ROCALTROL) 0.25 MCG capsule Take 0.25 mcg by mouth daily. Patient not taking: Reported on 06/01/2021    [provider]  cholecalciferol (VITAMIN D3) 25 MCG (1000 UNIT) tablet Take 1,000 Units by mouth daily.    [provider]  diltiazem (TIAZAC) 180 MG 24 hr capsule Take 180 mg by mouth daily. 08/04/20   [provider]  hydrALAZINE (APRESOLINE) 25 MG tablet Take 1 tablet (25 mg total) by mouth every 8 (eight) hours. 06/08/21   Jennye Boroughs, MD  Insulin Pen Needle 31G X 8 MM MISC Use as directed per sliding scale.  Dx Code: Y07.371 02/03/16  [provider]  latanoprost (XALATAN) 0.005 % ophthalmic solution Place 1 drop into both eyes at bedtime. 05/24/19   [provider]  methocarbamol (ROBAXIN) 500 MG tablet Take 1 tablet (500 mg total) by mouth 2 (two) times daily. 08/04/21   Tedd Sias, PA  sevelamer carbonate (RENVELA) 800 MG tablet Take 800-2,400 mg by mouth See admin instructions. Take 3 tablets (2400 mg) by mouth with meals and 1 tablet (800 mg) with snacks Patient not taking: Reported on 06/01/2021 07/17/20   [provider]  sildenafil (REVATIO) 20 MG  tablet Take 40-60 mg by mouth daily as needed (ED).  10/10/19   [provider]  sodium bicarbonate 650 MG tablet Take 1 tablet (650 mg total) by mouth 2 (two) times daily. Patient taking differently: Take 650 mg by mouth daily. 11/08/19   Santos-Sanchez, Merlene Morse, MD  TRADJENTA 5 MG TABS tablet Take 5 mg by mouth daily. 06/16/20   [provider]  TRESIBA FLEXTOUCH 100 UNIT/ML FlexTouch Pen Inject 22 Units into the skin daily. 06/08/21   Jennye Boroughs, MD    Exam: Current vital signs: BP (!) 163/125 (BP Location: Right Arm)   Pulse 77   Temp 98.3 F (36.8 C) (Oral)   Resp 18   Ht 5\' 10"  (1.778 m)   Wt 65.1 kg   SpO2 98%   BMI 20.59 kg/m   Physical Exam  Constitutional: Appears well-developed and well-nourished.  Psych: Affect appropriate to situation Eyes: No scleral injection HENT: No OP obstruction. Head: Normocephalic.  Cardiovascular: Normal rate and regular rhythm.  Respiratory: Effort normal, symmetric excursions bilaterally, no audible wheezing. GI: Soft.  No distension. There is no tenderness.  Skin: WDI  Neuro: Mental Status: Patient is awake, alert, oriented to person, place, month, year, and situation. Patient is able to give a clear and coherent history. Speech  fluent, intact comprehension and repetition. No signs of aphasia or neglect. Visual Fields are full. Pupils are equal, round, and reactive to light. EOMI without ptosis or diploplia.  Facial sensation is symmetric to temperature Facial movement is symmetric.  Hearing is intact to voice. Uvula midline and palate elevates symmetrically. Shoulder shrug is symmetric. Tongue is midline without atrophy or fasciculations.  Tone is normal. Bulk is normal. 5/5 strength was present in all four extremities. Sensation is symmetric to light touch and temperature in the arms and legs. Deep Tendon Reflexes: 2+ and symmetric in the biceps and patellae. Toes are downgoing bilaterally. FNF and HKS are  intact bilaterally. Gait - Deferred  I have reviewed labs in epic and the pertinent results are: Na 134 Glu 369  I have reviewed the images obtained: NCT head showed encephalomalacia in bilateral frontal lobes, left temporal lobe and scattered hypodensities in the periventricular and subcortical white matter.  Assessment: Jonathan Kane is a 75 y.o. male PMHx as noted above, DM2 (nonadherent), ESRD on HD M/W/F, HTN (uncontrolled) with seizure-like activity in the right lower extremity without loss of awareness.  Description of these events as starting distally in the right lower extremity with spread approximately may be seizure.  CT head showed areas of encephalomalacia in bilateral frontal lobes and left temporal lobe which may be concordant with right extremity seizure.  He will need further imaging and EEG to evaluate for focal aware seizure.  At this point there is not enough objective evidence to start seizure medication however will strongly consider starting levetiracetam.   He does live with 1 family member who can  no longer care for him and object of transfer is to also engage social services to evaluate home situation.  Plan: -MRI brain without contrast. -Routine EEG -Consult to social services/case worker. -Neurology will continue to follow  Electronically signed by:  Lynnae Sandhoff, MD Page: [phone] 08/08/2021, 3:21 AM

## 2021-08-08 NOTE — H&P (Signed)
History and Physical    Jonathan Kane HBZ:[id number] DOB: 10-22-1945 DOA: 08/07/2021  PCP: Jonathan Mile, NP   Patient coming from:   Home  Chief Complaint: Leg tremors  HPI: Jonathan Kane is a 75 y.o. male with medical history significant for DMT2, Seizures, ESRD on HD, M,W,F, HTN who presents for evaluation of intermittent episodes of right leg tremors and movement that he cannot control.  He reports that he has had the symptoms for the last 3 to 4 days had progressively gotten worse.  He states each episode lasts approximately 1 to 2 minutes and then resolves.  While the episodes occurring he states he has "foggy thinking" and is difficult to answer questions at those times.  If his leg is not having tremors he is able to think and speak clearly and normally he states.  He has not had any head injury or trauma.  He denies any episodes of syncope.  He denies any chest pain, palpitations, shortness of breath.  He denies any numbness in his extremities.  States he has not had any visual change or drooping face.  He reports he has a history of smoking but he quit a few years ago.  He states he occasionally drinks alcohol but none in the last 5 to 6 months.  He denies illicit drug use.  States he has been compliant with hemodialysis regimen.  States he has been compliant with his medications.  ED Course:  Jonathan Kane was found to have elevated BP in the ER 200/120 and was given labetolol with reduction of BP to 160/90-100 range.  He continues to have intermittent episodes of his right leg having tremors and shaking movement that he cannot control.  Labs reveal WBC 5100 hemoglobin 12.2 hematocrit 35 platelets 158,000, sodium 134 potassium 4.1 chloride 95 bicarb 23 creatinine 9.04 BUN 62 glucose 373 alkaline phosphatase 70 AST 18 ALT 16 bilirubin 0.5.  The scan of the head did not show any acute intracranial injury.  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  Hospitalist service was asked  to evaluate and provide further work-up.  Neurology has been consulted and will see patient  Review of Systems:  General: Denies fever, chills, weight loss, night sweats.  Denies dizziness.  Denies change in appetite HENT: Denies head trauma, headache, denies change in hearing, tinnitus.  Denies nasal bleeding.  Denies sore throat.  Denies difficulty swallowing Eyes: Denies blurry vision, pain in eye, drainage.  Denies discoloration of eyes. Neck: Denies pain.  Denies swelling.  Denies pain with movement. Cardiovascular: Denies chest pain, palpitations.  Denies edema.  Denies orthopnea Respiratory: Denies shortness of breath, cough.  Denies wheezing.  Denies sputum production Gastrointestinal: Denies abdominal pain, swelling.  Denies nausea, vomiting, diarrhea.  Denies melena.  Denies hematemesis. Musculoskeletal: Denies limitation of movement.  Denies deformity or swelling.  Denies pain.  Denies arthralgias or myalgias. Genitourinary: Denies pelvic pain.  Denies urinary frequency or hesitancy.  Denies dysuria.  Skin: Denies rash.  Denies petechiae, purpura, ecchymosis. Neurological: Denies syncope. Denies  paresthesia.  Denies slurred speech, drooping face.  Denies visual change. Psychiatric: Denies depression, anxiety.  Denies hallucinations.  Past Medical History:  Diagnosis Date   Diabetes mellitus without complication ([place])    End stage kidney disease ([place])    Erectile dysfunction    Hypertension    Vitamin D deficiency     Past Surgical History:  Procedure Laterality Date   COLONOSCOPY     IR DIALY  SHUNT INTRO NEEDLE/INTRACATH INITIAL W/IMG LEFT Left 06/25/2020   IR REMOVAL TUN CV CATH W/O FL  06/03/2021   keloid removed     TEE WITHOUT CARDIOVERSION N/A 06/04/2021   Procedure: TRANSESOPHAGEAL ECHOCARDIOGRAM (TEE);  Surgeon: Skeet Latch, MD;  Location: [place];  Service: Cardiovascular;  Laterality: N/A;    Social History  reports that he has quit smoking. He has  never used smokeless tobacco. He reports current alcohol use. He reports that he does not use drugs.  Allergies  Allergen Reactions   Dulaglutide     TRULICITY:  GI Upset intolerance    Family History  Problem Relation Age of Onset   Ovarian cancer Mother    Other Father        unsure of medical history     Prior to Admission medications   Medication Sig Start Date End Date Taking? Authorizing Provider  ACCU-CHEK AVIVA PLUS test strip 1 each by Other route as needed.  04/06/17   [provider]  ACCU-CHEK SOFTCLIX LANCETS lancets CHECK BLOOD SUGAR TWO TO THREE TIMES DAILY 04/05/17   [provider]  acetaminophen (TYLENOL) 325 MG tablet Take 2 tablets (650 mg total) by mouth every 6 (six) hours as needed for fever, mild pain or headache. 06/08/21   Jennye Boroughs, MD  Alcohol Swabs (B-D SINGLE USE SWABS REGULAR) PADS USE TO CHECK BLOOD SUGAR AS DIRECTED 01/26/17   [provider]  amLODipine (NORVASC) 10 MG tablet Take 10 mg by mouth daily. 05/20/20   [provider]  aspirin EC 81 MG tablet Take 81 mg by mouth daily.    [provider]  atorvastatin (LIPITOR) 40 MG tablet Take 40 mg by mouth at bedtime. 05/20/20   [provider]  Blood Glucose Monitoring Suppl (GLUCOCOM BLOOD GLUCOSE MONITOR) DEVI Use as directed.  Dx Code: E11.621 10/26/16   [provider]  calcitRIOL (ROCALTROL) 0.25 MCG capsule Take 0.25 mcg by mouth daily. Patient not taking: Reported on 06/01/2021    [provider]  cholecalciferol (VITAMIN D3) 25 MCG (1000 UNIT) tablet Take 1,000 Units by mouth daily.    [provider]  diltiazem (TIAZAC) 180 MG 24 hr capsule Take 180 mg by mouth daily. 08/04/20   [provider]  hydrALAZINE (APRESOLINE) 25 MG tablet Take 1 tablet (25 mg total) by mouth every 8 (eight) hours. 06/08/21   Jennye Boroughs, MD  Insulin Pen Needle 31G X 8 MM MISC Use as directed per sliding scale.  Dx Code: E11.621  02/03/16   [provider]  latanoprost (XALATAN) 0.005 % ophthalmic solution Place 1 drop into both eyes at bedtime. 05/24/19   [provider]  methocarbamol (ROBAXIN) 500 MG tablet Take 1 tablet (500 mg total) by mouth 2 (two) times daily. 08/04/21   Tedd Sias, PA  sevelamer carbonate (RENVELA) 800 MG tablet Take 800-2,400 mg by mouth See admin instructions. Take 3 tablets (2400 mg) by mouth with meals and 1 tablet (800 mg) with snacks Patient not taking: Reported on 06/01/2021 07/17/20   [provider]  sildenafil (REVATIO) 20 MG tablet Take 40-60 mg by mouth daily as needed (ED).  10/10/19   [provider]  sodium bicarbonate 650 MG tablet Take 1 tablet (650 mg total) by mouth 2 (two) times daily. Patient taking differently: Take 650 mg by mouth daily. 11/08/19   Santos-Sanchez, Merlene Morse, MD  TRADJENTA 5 MG TABS tablet Take 5 mg by mouth daily. 06/16/20   [provider]  TRESIBA FLEXTOUCH 100 UNIT/ML FlexTouch Pen Inject 22 Units into the skin daily. 06/08/21   Jennye Boroughs, MD    Physical Exam: Vitals:   08/07/21 2245 08/07/21 2314 08/07/21 2330 08/08/21 0100  BP: (!) 169/104 (!) 176/99 (!) 159/98 (!) 163/125  Pulse: 81 81 80 77  Resp: 12 12 12 18   Temp:    98.3 F (36.8 C)  TempSrc:    Oral  SpO2: 98% 98% 98% 98%  Weight:    65.1 kg  Height:    5\' 10"  (1.778 m)    Constitutional: NAD, calm, comfortable Vitals:   08/07/21 2245 08/07/21 2314 08/07/21 2330 08/08/21 0100  BP: (!) 169/104 (!) 176/99 (!) 159/98 (!) 163/125  Pulse: 81 81 80 77  Resp: 12 12 12 18   Temp:    98.3 F (36.8 C)  TempSrc:    Oral  SpO2: 98% 98% 98% 98%  Weight:    65.1 kg  Height:    5\' 10"  (1.778 m)   General: WDWN, Alert and oriented x3.  Eyes: EOMI, PERRL, conjunctivae normal.  Sclera nonicteric HENT:  /AT, external ears normal.  Nares patent without epistasis.  Mucous membranes are moist. Posterior pharynx clear Neck: Soft, normal range of  motion, supple, no masses, no thyromegaly.  Trachea midline Respiratory: clear to auscultation bilaterally, no wheezing, no crackles. Normal respiratory effort. No accessory muscle use.  Cardiovascular: Regular rate and rhythm, no murmurs / rubs / gallops. No extremity edema. 2+ pedal pulses.  Abdomen: Soft, no tenderness, nondistended, no rebound or guarding.  No masses palpated. Bowel sounds normoactive Musculoskeletal: FROM. no cyanosis. No joint deformity upper and lower extremities. Normal muscle tone.  Skin: Warm, dry, intact no rashes, lesions, ulcers. No induration Neurologic: CN 2-12 grossly intact. Normal speech. Sensation intact to touch. Strength 5/5 in all extremities. Has intermittent tremor of right leg.  Psychiatric: Normal judgment and insight.  Normal mood.    Labs on Admission: I have personally reviewed following labs and imaging studies  CBC: Recent Labs  Lab 08/04/21 1528 08/07/21 1846  WBC 5.2 5.1  NEUTROABS 3.6 3.1  HGB 11.5* 12.2*  HCT 33.7* 35.0*  MCV 91.1 90.7  PLT 170 725    Basic Metabolic Panel: Recent Labs  Lab 08/04/21 1528 08/07/21 1846  NA 131* 134*  K 4.2 4.1  CL 92* 95*  CO2 22 23  GLUCOSE 582* 373*  BUN 61* 62*  CREATININE 9.50* 9.04*  CALCIUM 9.2 9.3  MG 2.4  --     GFR: Estimated Creatinine Clearance: 6.7 mL/min (A) (by C-G formula based on SCr of 9.04 mg/dL (H)).  Liver Function Tests: Recent Labs  Lab 08/04/21 1528 08/07/21 1846  AST 18 18  ALT 15 16  ALKPHOS 76 70  BILITOT 0.6 0.5  PROT 8.3* 9.1*  ALBUMIN 3.9 4.6    Urine analysis:    Component Value Date/Time   COLORURINE YELLOW 06/01/2021 1400   APPEARANCEUR HAZY (A) 06/01/2021 1400   LABSPEC 1.011 06/01/2021 1400   PHURINE 7.0 06/01/2021 1400   GLUCOSEU 150 (A) 06/01/2021 1400   HGBUR SMALL (A) 06/01/2021 1400   BILIRUBINUR NEGATIVE 06/01/2021 1400   KETONESUR 5 (A) 06/01/2021 1400   PROTEINUR 100 (A) 06/01/2021 1400   NITRITE NEGATIVE 06/01/2021 1400    LEUKOCYTESUR SMALL (A) 06/01/2021 1400    Radiological Exams on Admission: CT Head Wo Contrast  Result Date: 08/07/2021 CLINICAL DATA:  Right leg twitching for 3-4 days. EXAM: CT HEAD WITHOUT  CONTRAST TECHNIQUE: Contiguous axial images were obtained from the base of the skull through the vertex without intravenous contrast. COMPARISON:  CT head 05/28/2021 FINDINGS: Brain: No evidence of acute infarction, hemorrhage, hydrocephalus, extra-axial collection or mass lesion/mass effect. Unchanged encephalomalacia in the frontal lobes and left temporal lobe. Hypodensities in the periventricular and subcortical white matter, consistent with chronic small vessel ischemic changes. Generalized parenchymal volume loss with associated enlargement of the lateral ventricles. Vascular: No hyperdense vessel or unexpected calcification. Skull: Normal. Negative for fracture or focal lesion. Sinuses/Orbits: No acute finding. Other: None. IMPRESSION: No acute intracranial abnormality. 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 Electronically Signed   By: Ileana Roup M.D.   On: 08/07/2021 19:24   DG Chest Portable 1 View  Result Date: 08/07/2021 CLINICAL DATA:  Right leg twitching. EXAM: PORTABLE CHEST 1 VIEW COMPARISON:  June 01, 2021 FINDINGS: There is stable right-sided venous catheter positioning. Multiple overlying radiopaque cardiac lead wires are seen. The heart size and mediastinal contours are within normal limits. Both lungs are clear. The visualized skeletal structures are unremarkable. IMPRESSION: No active disease. Electronically Signed   By: Virgina Norfolk M.D.   On: 08/07/2021 19:23    EKG: Independently reviewed.  EKG shows normal sinus rhythm with no acute ST elevation or depression.  LVH by voltage criteria.  QTc 474  Assessment/Plan Principal Problem:   Hypertensive urgency Jonathan Kane placed in observation. He was given labetolol in the Er and BP has improved. Continue to monitor.  Will not lower BP further until CVA ruled out with MRI.   Active Problems:   Seizure  Has potential seizure with repeat episodes of leg tremors and movement and pt has confusion when is occurring.  EEG ordered.  Neurology consulted.     Uncontrolled type 2 diabetes mellitus with hyperglycemia  Continue with tradjenda and tresiba. Novolog provided as needed for glycemic control. Check HgbA1c level.     ESRD on hemodialysis  Has HD on Monday, Wednesday, Friday and will need to consult nephrology for HD Monday if not discharged tomorrow.   DVT prophylaxis: Heparin for DVT prophylaxis  Code Status:   Full Code  Family Communication:  Diagnosis and plan discussed with patient.  Verbalized understanding agrees with plan.  Further recommendation follow as clinical indicated Disposition Plan:   Patient is from:  Home  Anticipated DC to:  Home  Anticipated DC date:  Anticipate 2 midnight or less stay in the hospital  Consults called:  Neurology, Dr. Theda Sers  Admission status:  Observation   Yevonne Aline Kaycee Mcgaugh MD [facility]  How to contact the [facility] Attending or Consulting provider [place] or covering provider during after hours [place], for this patient?   Check the care team in [facility] and look for a) attending/consulting [facility] provider listed and b) the [facility] team listed Log into [email or web address] and use [place] universal password to access. If you do not have the password, please contact the hospital operator. Locate the [facility] provider you are looking for under [facility] and page to a number that you can be directly reached. If you still have difficulty reaching the provider, please page the [facility] (Director on Call) for the Hospitalists listed on amion for assistance.  08/08/2021, 2:18 AM

## 2021-08-08 NOTE — Evaluation (Signed)
Physical Therapy Evaluation Patient Details Name: Jonathan Kane MRN: [id number] DOB: Jul 06, 1946 Today's Date: 08/08/2021  History of Present Illness  Pt adm 10/22 with intermittent RLE tremors and involuntary movements. 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 Neuro consult with focal seizures.  PMH - ESRD on HD MWF, DMII,, Hep C, seizure and HTN.  Clinical Impression  Pt presents to PT with mobility primarily limited by intermittent episodes of involuntary RLE tremors/movments. During these episodes pt needs assist to safely maintain standing until it has passed. Four episode of this lasting 30-60 seconds during my 20 minutes in the room. If these are controlled expect pt could return home. If they aren't may need some form of placement.        Recommendations for follow up therapy are one component of a multi-disciplinary discharge planning process, led by the attending physician.  Recommendations may be updated based on patient status, additional functional criteria and insurance authorization.  Follow Up Recommendations Home health PT (if RLE involuntary movements controlled)    Equipment Recommendations  None recommended by PT    Recommendations for Other Services       Precautions / Restrictions Precautions Precautions: Fall;Other (comment) Precaution Comments: focal seizures involving RLE      Mobility  Bed Mobility Overal bed mobility: Modified Independent             General bed mobility comments: Incr time    Transfers Overall transfer level: Needs assistance Equipment used: Rolling walker (2 wheeled) Transfers: Sit to/from Stand Sit to Stand: Min guard         General transfer comment: Assist for safety  Ambulation/Gait Ambulation/Gait assistance: Min assist Gait Distance (Feet): 100 Feet Assistive device: Rolling walker (2 wheeled) Gait Pattern/deviations: Step-through pattern;Decreased stance time - right Gait  velocity: decr Gait velocity interpretation: <1.31 ft/sec, indicative of household ambulator General Gait Details: Pt amb with step through gait pattern until he begins having painful tremors/involuntary movements (pulling into flexion) of RLE. At that time has to stop and weight bear on BUE's and LLE until it passes after 30-60 seconds.  Stairs            Wheelchair Mobility    Modified Rankin (Stroke Patients Only)       Balance Overall balance assessment: Needs assistance Sitting-balance support: No upper extremity supported;Feet supported Sitting balance-Leahy Scale: Good     Standing balance support: Bilateral upper extremity supported Standing balance-Leahy Scale: Poor Standing balance comment: walker and min guard for static standing                             Pertinent Vitals/Pain Pain Assessment: Faces Faces Pain Scale: Hurts even more Pain Location: RLE with involuntary movements/tremors Pain Descriptors / Indicators: Spasm;Grimacing;Guarding Pain Intervention(s): Limited activity within patient's tolerance;Monitored during session    Home Living Family/patient expects to be discharged to:: Private residence Living Arrangements: Alone Available Help at Discharge: Family;Available PRN/intermittently Type of Home: House Home Access: Stairs to enter Entrance Stairs-Rails: None Entrance Stairs-Number of Steps: 1 Home Layout: One level Home Equipment: Walker - 2 wheels;Cane - single point;Grab bars - tub/shower;Grab bars - toilet;Shower seat - built in      Prior Function Level of Independence: Independent with assistive device(s)         Comments: Pt has been using rolling walker lately. Drives himself to/from HD     Hand Dominance   Dominant Hand:  Right    Extremity/Trunk Assessment   Upper Extremity Assessment Upper Extremity Assessment: Overall WFL for tasks assessed    Lower Extremity Assessment Lower Extremity Assessment: RLE  deficits/detail RLE Deficits / Details: Multiple times during session pt with tremors and involuntary drawing up of RLE       Communication   Communication: No difficulties  Cognition Arousal/Alertness: Awake/alert Behavior During Therapy: WFL for tasks assessed/performed Overall Cognitive Status: Within Functional Limits for tasks assessed                                        General Comments General comments (skin integrity, edema, etc.): VSS on RA    Exercises     Assessment/Plan    PT Assessment Patient needs continued PT services  PT Problem List Decreased strength;Decreased balance;Decreased mobility;Pain       PT Treatment Interventions DME instruction;Functional mobility training;Balance training;Patient/family education;Therapeutic activities;Gait training;Therapeutic exercise;Stair training    PT Goals (Current goals can be found in the Care Plan section)  Acute Rehab PT Goals Patient Stated Goal: go home PT Goal Formulation: With patient Time For Goal Achievement: 08/22/21 Potential to Achieve Goals: Good    Frequency Min 3X/week   Barriers to discharge Decreased caregiver support Lives alone    Co-evaluation               AM-PAC PT "6 Clicks" Mobility  Outcome Measure Help needed turning from your back to your side while in a flat bed without using bedrails?: None Help needed moving from lying on your back to sitting on the side of a flat bed without using bedrails?: None Help needed moving to and from a bed to a chair (including a wheelchair)?: A Little Help needed standing up from a chair using your arms (e.g., wheelchair or bedside chair)?: A Little Help needed to walk in hospital room?: A Little Help needed climbing 3-5 steps with a railing? : A Little 6 Click Score: 20    End of Session   Activity Tolerance: Patient tolerated treatment well Patient left: in chair;with chair alarm set;with call bell/phone within  reach Nurse Communication: Mobility status PT Visit Diagnosis: Other abnormalities of gait and mobility (R26.89);Other symptoms and signs involving the nervous system (R29.898)    Time: 1856-3149 PT Time Calculation (min) (ACUTE ONLY): 20 min   Charges:   PT Evaluation $PT Eval Moderate Complexity: [place] Pager [phone] Office [place] 08/08/2021, 4:44 PM

## 2021-08-08 NOTE — Progress Notes (Signed)
PROGRESS NOTE  BENNETT Kane  DOB: 01-03-1946  PCP: Jonathan Mile, NP MVE:[id number]  DOA: 08/07/2021  LOS: 0 days  Hospital Day: 2   Chief Complaint  Patient presents with   Leg Pain    Brief narrative: Jonathan Kane is a 76 y.o. male with PMH significant for ESRD HD MWF DM2, uncontrolled HTN, HLD and no history of seizure disorder who lives at home alone and has been lately requiring a walker to ambulate. Patient presented to the ED 10/22 with complaint of intermittent episodes of right leg tremors and involuntary uncontrollable movements.  Episodes last about 1 to 2 minutes, symptoms ongoing and progressive for the last 3 to 4 days movement.  Also during these episodes he feels foggy sensation and has difficult time answering questions without complete loss of awareness.  In the ED, patient had blood pressure elevated up to 200s. 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 Admitted to hospitalist service Neurology consulted. 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   Subjective: Patient was seen and examined this morning.  Pleasant elderly African-American male.  Lying on bed.  Not in distress.  No new symptoms. Chart reviewed Blood pressure overnight gradually improving down to 140s this morning. Blood glucose level remain elevated to over 400 this morning.  Assessment/Plan: Complex partial seizure -Episodes of involuntary short lasting, frequent right leg tremors with foggy sensation could be complex partial seizure. -Neurology consulted -MRI brain showed a potential focus of a small acute or subacute infarct. -EEG pending -Defer to neurology for AED initiation if needed  Hypertensive urgency Uncontrolled hypertension -Blood pressure average over 200s  in the ED. -Gradually improving this morning down to 140s -Home meds include Cardizem 180 mg daily, amlodipine 10 mg daily, hydralazine 25 mg 3 times daily, -Continue the same.  Continue to monitor  ESRD HD MWF -We will involve nephrology. -Continue Renvela and sodium bicarb.    Uncontrolled type 2 diabetes mellitus with hyperglycemia -A1c 10.6 on 10/23.   -Blood sugar level elevated to over 400 this morning. -Home meds include Tresiba 22 units daily, and Tradjenta 5 mg daily. -Currently on Semglee 22 units and Tradjenta 5 mg daily Recent Labs  Lab 08/04/21 1458 08/04/21 1830 08/07/21 1844 08/07/21 2030 08/08/21 0834  GLUCAP 506* 476* 348* 317* 424*   Hyperlipidemia -Continue aspirin 81 mg daily, Lipitor 40 mg daily   Mobility: Lives alone, uses a walker.  Pending PT eval. Code Status:   Code Status: Full Code  Nutritional status: Body mass index is 20.59 kg/m.     Diet:  Diet Order             Diet heart healthy/carb modified Room service appropriate? Yes; Fluid consistency: Thin  Diet effective now                  DVT prophylaxis:  heparin injection 5,000 Units Start: 08/08/21 1400   Antimicrobials: None Fluid: None Consultants: Neurology Family Communication: None at bedside  Status is: Observation  Remains inpatient appropriate because: Pending EEG  Dispo: The patient is from: Home              Anticipated d/c is to: Hopefully home in 1 to 2 days              Patient  currently is not medically stable to d/c.   Difficult to place patient No     Infusions:   sodium chloride     levETIRAcetam     levETIRAcetam      Scheduled Meds:  amLODipine  10 mg Oral Daily   aspirin EC  81 mg Oral Daily   atorvastatin  40 mg Oral QHS   diltiazem  180 mg Oral Daily   heparin  5,000 Units Subcutaneous Q8H   hydrALAZINE  25 mg Oral Q8H   insulin aspart  0-5 Units Subcutaneous QHS   insulin aspart  0-6 Units Subcutaneous TID WC   insulin glargine-yfgn   22 Units Subcutaneous Daily   linagliptin  5 mg Oral Daily   sevelamer carbonate  2,400 mg Oral TID WC   sodium chloride flush  3 mL Intravenous Q12H    Antimicrobials: Anti-infectives (From admission, onward)    None       PRN meds: sodium chloride, acetaminophen **OR** acetaminophen, senna, sevelamer carbonate, sodium chloride flush   Objective: Vitals:   08/08/21 0714 08/08/21 0915  BP: (!) 149/85 (!) 155/92  Pulse: 88   Resp: 16   Temp: 98.5 F (36.9 C)   SpO2: 99%     Intake/Output Summary (Last 24 hours) at 08/08/2021 1140 Last data filed at 08/08/2021 0400 Gross per 24 hour  Intake 400 ml  Output --  Net 400 ml   Filed Weights   08/07/21 1624 08/08/21 0100  Weight: 63.5 kg 65.1 kg   Weight change:  Body mass index is 20.59 kg/m.   Physical Exam: General exam: Pleasant, elderly African-American male.  Not in physical distress Skin: No rashes, lesions or ulcers. HEENT: Atraumatic, normocephalic, no obvious bleeding Lungs: Clear to auscultation bilaterally CVS: Regular rate and rhythm, no murmur GI/Abd soft, nontender, nondistended, bowel sound present CNS: Alert, awake, oriented x3 Psychiatry: Depressed look Extremities: No pedal edema, no calf tenderness  Data Review: I have personally reviewed the laboratory data and studies available.  Recent Labs  Lab 08/04/21 1528 08/07/21 1846  WBC 5.2 5.1  NEUTROABS 3.6 3.1  HGB 11.5* 12.2*  HCT 33.7* 35.0*  MCV 91.1 90.7  PLT 170 158   Recent Labs  Lab 08/04/21 1528 08/07/21 1846 08/08/21 0225  NA 131* 134* 134*  K 4.2 4.1 4.0  CL 92* 95* 98  CO2 22 23 21*  GLUCOSE 582* 373* 369*  BUN 61* 62* 59*  CREATININE 9.50* 9.04* 9.78*  CALCIUM 9.2 9.3 9.1  MG 2.4  --   --     F/u labs ordered Unresulted Labs (From admission, onward)     Start     Ordered   08/09/21 0500  CBC with Differential/Platelet  Daily,   R      08/08/21 1140   08/09/21 2811  Basic metabolic panel  Daily,   R       08/08/21 1140   08/08/21 0858  Urinalysis, Routine w reflex microscopic Urine, Clean Catch  ONCE - STAT,   STAT        08/08/21 0857            Signed, Terrilee Croak, MD [facility] 08/08/2021

## 2021-08-08 NOTE — Progress Notes (Signed)
Patient hooked up to LTM EEG. [facility] notified. Nurse was in room when I left.

## 2021-08-08 NOTE — Progress Notes (Addendum)
Neurology Progress Note   S:// Patient is awake and alert, sitting in bed. No family present. He states that he has had right leg twitching for about a week, and it comes and goes lasting for a few minutes He did have transient focal twitching of the right leg during exam today. MRI scan of the brain is negative for definite acute stroke.  Alert faint area of diffusion hyper intensity in the right frontal white matter is likely due to T2 shine through rather than an acute infarct. O:// Current vital signs: BP 121/79 (BP Location: Right Arm)   Pulse 88   Temp 98.2 F (36.8 C) (Oral)   Resp 16   Ht 5\' 10"  (1.778 m)   Wt 65.1 kg   SpO2 99%   BMI 20.59 kg/m  Vital signs in last 24 hours: Temp:  [97.9 F (36.6 C)-98.5 F (36.9 C)] 98.2 F (36.8 C) (10/23 1145) Pulse Rate:  [77-98] 88 (10/23 0714) Resp:  [12-24] 16 (10/23 1145) BP: (121-200)/(79-125) 121/79 (10/23 1145) SpO2:  [98 %-100 %] 99 % (10/23 0714) Weight:  [63.5 kg-65.1 kg] 65.1 kg (10/23 0100)  GENERAL: Awake, alert in NAD HEENT: - Normocephalic and atraumatic, dry mm LUNGS - Clear to auscultation bilaterally with no wheezes CV - S1S2 RRR, no m/r/g, equal pulses bilaterally. ABDOMEN - Soft, nontender, nondistended with normoactive BS Ext: warm, well perfused, intact peripheral pulses, no edema PSYCH normal mood and affect   NEURO:  Mental Status: AA&Ox3  Language: speech is clear.  Naming, repetition, fluency, and comprehension intact. Cranial Nerves: PERRL 66mm/brisk. EOMI, visual fields full, no facial asymmetry, facial sensation intact, hearing intact, tongue/uvula/soft palate midline, normal sternocleidomastoid and trapezius muscle strength. No evidence of tongue atrophy or fibrillations Motor: 5/5 in all 4 extremities  Tone: is normal and bulk is normal Sensation- Intact to light touch bilaterally Coordination: FTN intact bilaterally, no ataxia in BLE. Gait- deferred  Medications  Current  Facility-Administered Medications:    0.9 %  sodium chloride infusion, 250 mL, Intravenous, PRN, Chotiner, Yevonne Aline, MD   acetaminophen (TYLENOL) tablet 650 mg, 650 mg, Oral, Q6H PRN **OR** acetaminophen (TYLENOL) suppository 650 mg, 650 mg, Rectal, Q6H PRN, Chotiner, Yevonne Aline, MD   amLODipine (NORVASC) tablet 10 mg, 10 mg, Oral, Daily, Chotiner, Yevonne Aline, MD, 10 mg at 08/08/21 9702   aspirin EC tablet 81 mg, 81 mg, Oral, Daily, Chotiner, Yevonne Aline, MD, 81 mg at 08/08/21 0911   atorvastatin (LIPITOR) tablet 40 mg, 40 mg, Oral, QHS, Chotiner, Yevonne Aline, MD   Chlorhexidine Gluconate Cloth 2 % PADS 6 each, 6 each, Topical, Daily, Dahal, Binaya, MD   [START ON 08/09/2021] Chlorhexidine Gluconate Cloth 2 % PADS 6 each, 6 each, Topical, Q0600, Roney Jaffe, MD   diltiazem (CARDIZEM CD) 24 hr capsule 180 mg, 180 mg, Oral, Daily, Chotiner, Yevonne Aline, MD, 180 mg at 08/08/21 0915   heparin injection 5,000 Units, 5,000 Units, Subcutaneous, Q8H, Chotiner, Yevonne Aline, MD   hydrALAZINE (APRESOLINE) tablet 25 mg, 25 mg, Oral, Q8H, Chotiner, Yevonne Aline, MD, 25 mg at 08/08/21 0437   insulin aspart (novoLOG) injection 0-5 Units, 0-5 Units, Subcutaneous, QHS, Chotiner, Yevonne Aline, MD   insulin aspart (novoLOG) injection 0-6 Units, 0-6 Units, Subcutaneous, TID WC, Chotiner, Yevonne Aline, MD, 3 Units at 08/08/21 1214   insulin glargine-yfgn (SEMGLEE) injection 22 Units, 22 Units, Subcutaneous, Daily, Chotiner, Yevonne Aline, MD, 22 Units at 08/08/21 0919   levETIRAcetam (KEPPRA) IVPB 1500 mg/ 100 mL premix, 1,500 mg,  Intravenous, Q12H, Beulah Gandy A, NP   linagliptin (TRADJENTA) tablet 5 mg, 5 mg, Oral, Daily, Chotiner, Yevonne Aline, MD, 5 mg at 08/08/21 1610   senna (SENOKOT) tablet 8.6 mg, 1 tablet, Oral, Daily PRN, Opyd, Ilene Qua, MD   sevelamer carbonate (RENVELA) tablet 2,400 mg, 2,400 mg, Oral, TID WC, Chotiner, Yevonne Aline, MD, 2,400 mg at 08/08/21 1411   sevelamer carbonate (RENVELA) tablet 800 mg, 800 mg, Oral, PRN,  Chotiner, Yevonne Aline, MD   sodium chloride flush (NS) 0.9 % injection 3 mL, 3 mL, Intravenous, Q12H, Chotiner, Yevonne Aline, MD, 3 mL at 08/08/21 1219   sodium chloride flush (NS) 0.9 % injection 3 mL, 3 mL, Intravenous, PRN, Chotiner, Yevonne Aline, MD, 3 mL at 08/08/21 0933 Labs CBC    Component Value Date/Time   WBC 5.1 08/07/2021 1846   RBC 3.86 (L) 08/07/2021 1846   HGB 12.2 (L) 08/07/2021 1846   HCT 35.0 (L) 08/07/2021 1846   PLT 158 08/07/2021 1846   MCV 90.7 08/07/2021 1846   MCH 31.6 08/07/2021 1846   MCHC 34.9 08/07/2021 1846   RDW 14.7 08/07/2021 1846   LYMPHSABS 1.2 08/07/2021 1846   MONOABS 0.5 08/07/2021 1846   EOSABS 0.2 08/07/2021 1846   BASOSABS 0.1 08/07/2021 1846    CMP     Component Value Date/Time   NA 134 (L) 08/08/2021 0225   K 4.0 08/08/2021 0225   CL 98 08/08/2021 0225   CO2 21 (L) 08/08/2021 0225   GLUCOSE 369 (H) 08/08/2021 0225   BUN 59 (H) 08/08/2021 0225   CREATININE 9.78 (H) 08/08/2021 0225   CALCIUM 9.1 08/08/2021 0225   PROT 9.1 (H) 08/07/2021 1846   ALBUMIN 4.6 08/07/2021 1846   AST 18 08/07/2021 1846   ALT 16 08/07/2021 1846   ALKPHOS 70 08/07/2021 1846   BILITOT 0.5 08/07/2021 1846   GFRNONAA 5 (L) 08/08/2021 0225   GFRAA 9 (L) 11/08/2019 0411    glycosylated hemoglobin  Lipid Panel     Component Value Date/Time   CHOL 89 06/01/2021 0316   TRIG 103 06/01/2021 0317   HDL 32 (L) 06/01/2021 0316   CHOLHDL 2.8 06/01/2021 0316   VLDL 20 06/01/2021 0316   LDLCALC 37 06/01/2021 0316     Imaging I have reviewed images in epic and the results pertinent to this consultation are:  CT-scan of the brain: No acute intracranial abnormality. 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 MRI examination of the brain: Questionable small acute to subacute white matter infarct in the right frontal lobe, versus T2-shine-through in the setting of advanced underlying chronic white matter disease  Assessment:   Jonathan Kane is a 75 y.o.  male PMHx as reviewed below, DM2 (nonadherent), ESRD on HD M/W/F, HTN (uncontrolled) transferred from OSH after presenting with complaints of right leg twitching for the past week. He received keppra 1000mg  in ED.  Recent admission in August 2022 for DKA in which he had provoked seizures due to hyperglycemia and keppra was d/c'd.  Now admitted for focal seizures so will continue Keppra @ 1500mg  BID  MRI scan of the brain is negative for definite acute infarct the patient clearly had focal seizures even during her presence. Recommendations: Continue Keppra 1500mg  BID Check LTM EEG  Rule out infectious process: Check CXR, UA Control BG  Beulah Gandy, NP   STROKE MD NOTE : I have personally obtained history,examined this patient, reviewed notes, independently viewed imaging studies, participated in medical decision making and plan  of care.ROS completed by me personally and pertinent positives fully documented  I have made any additions or clarifications directly to the above note. Agree with note above.  Patient presented with several day history of focal seizures involving the right leg.  MRI is negative for definite acute stroke.  Recommend continue Keppra 1500 mg twice daily for seizures and check long-term EEG monitoring overnight for ongoing nonconvulsive status in that case may need to be more aggressive with seizure medications.  We will ask neuro hospitalist team to follow henceforth during this hospitalization.  No further stroke related work-up is necessary.  Stroke team will sign off.  Kindly call for questions.  Greater than 50% time during this 35-minute visit was spent on counseling and coordination of care about seizures and answering questions.  Discussed with Dr. Luna Kitchens, MD Medical Director [facility] Pager: [phone] 08/08/2021 4:56 PM

## 2021-08-09 ENCOUNTER — Ambulatory Visit: Payer: Self-pay | Admitting: Neurology

## 2021-08-09 DIAGNOSIS — I160 Hypertensive urgency: Secondary | ICD-10-CM | POA: Diagnosis not present

## 2021-08-09 DIAGNOSIS — R569 Unspecified convulsions: Secondary | ICD-10-CM | POA: Diagnosis not present

## 2021-08-09 LAB — CBC WITH DIFFERENTIAL/PLATELET
Abs Immature Granulocytes: 0.01 10*3/uL (ref 0.00–0.07)
Basophils Absolute: 0.1 10*3/uL (ref 0.0–0.1)
Basophils Relative: 1 %
Eosinophils Absolute: 0.3 10*3/uL (ref 0.0–0.5)
Eosinophils Relative: 7 %
HCT: 31.7 % — ABNORMAL LOW (ref 39.0–52.0)
Hemoglobin: 10.6 g/dL — ABNORMAL LOW (ref 13.0–17.0)
Immature Granulocytes: 0 %
Lymphocytes Relative: 25 %
Lymphs Abs: 1.2 10*3/uL (ref 0.7–4.0)
MCH: 30.6 pg (ref 26.0–34.0)
MCHC: 33.4 g/dL (ref 30.0–36.0)
MCV: 91.6 fL (ref 80.0–100.0)
Monocytes Absolute: 0.6 10*3/uL (ref 0.1–1.0)
Monocytes Relative: 13 %
Neutro Abs: 2.7 10*3/uL (ref 1.7–7.7)
Neutrophils Relative %: 54 %
Platelets: 161 10*3/uL (ref 150–400)
RBC: 3.46 MIL/uL — ABNORMAL LOW (ref 4.22–5.81)
RDW: 14.2 % (ref 11.5–15.5)
WBC: 4.9 10*3/uL (ref 4.0–10.5)
nRBC: 0 % (ref 0.0–0.2)

## 2021-08-09 LAB — GLUCOSE, CAPILLARY
Glucose-Capillary: 243 mg/dL — ABNORMAL HIGH (ref 70–99)
Glucose-Capillary: 327 mg/dL — ABNORMAL HIGH (ref 70–99)
Glucose-Capillary: 224 mg/dL — ABNORMAL HIGH (ref 70–99)

## 2021-08-09 LAB — BASIC METABOLIC PANEL
Anion gap: 14 (ref 5–15)
BUN: 71 mg/dL — ABNORMAL HIGH (ref 8–23)
CO2: 22 mmol/L (ref 22–32)
Calcium: 8.8 mg/dL — ABNORMAL LOW (ref 8.9–10.3)
Chloride: 98 mmol/L (ref 98–111)
Creatinine, Ser: 10.93 mg/dL — ABNORMAL HIGH (ref 0.61–1.24)
GFR, Estimated: 5 mL/min — ABNORMAL LOW (ref >60)
Glucose, Bld: 243 mg/dL — ABNORMAL HIGH (ref 70–99)
Potassium: 4.1 mmol/L (ref 3.5–5.1)
Sodium: 134 mmol/L — ABNORMAL LOW (ref 135–145)

## 2021-08-09 LAB — HEPATITIS B SURFACE ANTIBODY,QUALITATIVE: Hep B S Ab: REACTIVE — AB

## 2021-08-09 LAB — HEPATITIS B SURFACE ANTIGEN: Hepatitis B Surface Ag: NONREACTIVE

## 2021-08-09 MED ORDER — INSULIN ASPART 100 UNIT/ML IJ SOLN
3.0000 [IU] | Freq: Three times a day (TID) | INTRAMUSCULAR | Status: DC
  Administered 2021-08-10 – 2021-08-12 (×6): 3 [IU] via SUBCUTANEOUS

## 2021-08-09 MED ORDER — SODIUM CHLORIDE 0.9 % IV SOLN
1000.0000 mg | INTRAVENOUS | Status: AC
  Administered 2021-08-09: 1000 mg via INTRAVENOUS
  Filled 2021-08-09: qty 20

## 2021-08-09 MED ORDER — HEPARIN SODIUM (PORCINE) 1000 UNIT/ML DIALYSIS
2500.0000 [IU] | Freq: Once | INTRAMUSCULAR | Status: DC
  Filled 2021-08-09: qty 3

## 2021-08-09 NOTE — Progress Notes (Signed)
Subjective: Patient states he is continue to have right lower extremity stiffening and twitching, average once every hour, lasting few minutes each time. States it predominantly involves his right foot but goes all the way up to right side of his abdomen.  Denies any other concerns.  ROS: negative except above  Examination  Vital signs in last 24 hours: Temp:  [97.8 F (36.6 C)-98.4 F (36.9 C)] 98.4 F (36.9 C) (10/24 0606) Pulse Rate:  [79-80] 79 (10/24 0606) Resp:  [16-18] 18 (10/24 0606) BP: (94-160)/(60-87) 160/87 (10/24 0606) SpO2:  [100 %] 100 % (10/24 0606)  General: lying in bed, NAD CVS: pulse-normal rate and rhythm RS: breathing comfortably, CTAB Extremities: normal, warm  Neuro: MS: Alert, oriented, follows commands CN: pupils equal and reactive,  EOMI, face symmetric, tongue midline, normal sensation over face, Motor: 5/5 strength in all 4 extremities, did have an episode of right lower extremity plan that is predominantly right foot) stiffening which resolved in a few minutes while I was examining the patient. Reflexes: 2+ bilaterally over patella, biceps, plantars: flexor Coordination: normal Gait: not tested  Basic Metabolic Panel: Recent Labs  Lab 08/04/21 1528 08/07/21 1846 08/08/21 0225 08/09/21 0146  NA 131* 134* 134* 134*  K 4.2 4.1 4.0 4.1  CL 92* 95* 98 98  CO2 22 23 21* 22  GLUCOSE 582* 373* 369* 243*  BUN 61* 62* 59* 71*  CREATININE 9.50* 9.04* 9.78* 10.93*  CALCIUM 9.2 9.3 9.1 8.8*  MG 2.4  --   --   --     CBC: Recent Labs  Lab 08/04/21 1528 08/07/21 1846 08/09/21 0146  WBC 5.2 5.1 4.9  NEUTROABS 3.6 3.1 2.7  HGB 11.5* 12.2* 10.6*  HCT 33.7* 35.0* 31.7*  MCV 91.1 90.7 91.6  PLT 170 158 161     Coagulation Studies: No results for input(s): LABPROT, INR in the last 72 hours.  Imaging MRI brain without contrast 08/08/2021: Questionable small acute to subacute white matter infarct in the right frontal lobe, versus  T2-shine-through in the setting of advanced underlying chronic white matter disease. No associated hemorrhage or mass effect.   2. Otherwise continued stable MRI appearance of the brain. 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      ASSESSMENT AND PLAN: 75 year old male with bilateral frontal head left temporal encephalomalacia now with right lower extremity twitching and stiffening which is concerning for focal motor seizures.  Focal motor seizures, right lower extremity Hypertensive urgency Hyponatremia Diabetes End-stage renal disease Anemia - This study is within normal limits. No seizures or epileptiform discharges were seen throughout the recording. Two events were recorded as described above during which patient had right leg twitching without concomitant EEG change. However, focal motor seizures may not be seen on scalp EEG.  Therefore clinical correlation is recommended.  Recommendations -After review of patient symptoms and MRI, and within the patient is having focal motor seizures which are not currently controlled with Keppra.  Therefore will load with IV fosphenytoin 1000 mg once -Continue Keppra 1500 mg twice daily -If seizures persist, will load with phenobarbital -Continue LTM EEG to look for intermittent seizures -IV Ativan 2 mg clinical seizure-like activity lasting more than 15 minutes.  Please notify neurology if administered -Management of rest of comorbidities per primary team  I have spent a total of  40 minutes with the patient reviewing hospital notes,  test results, labs and examining the patient as well as establishing an assessment and plan that was discussed  personally with the patient.  > 50% of time was spent in direct patient care.    Zeb Comfort Epilepsy [facility] For questions after 5pm please refer to AMION to reach the Neurologist on call

## 2021-08-09 NOTE — Progress Notes (Signed)
LTM maint complete - no skin breakdown but has irritation at Fp1 Fp2  Patient states he started to take leads off because they were itchy.  Tech replaced several leads.

## 2021-08-09 NOTE — Progress Notes (Signed)
PROGRESS NOTE  Jonathan Kane  DOB: February 10, 1946  PCP: Cipriano Mile, NP TLX:[id number]  DOA: 08/07/2021  LOS: 1 day  Hospital Day: 3   Chief Complaint  Patient presents with   Leg Pain    Brief narrative: Jonathan Kane is a 75 y.o. male with PMH significant for ESRD HD MWF DM2, uncontrolled HTN, HLD and no history of seizure disorder who lives at home alone and has been lately requiring a walker to ambulate. Patient presented to the ED 10/22 with complaint of intermittent episodes of right leg tremors and involuntary uncontrollable movements.  Episodes last about 1 to 2 minutes, symptoms ongoing and progressive for the last 3 to 4 days movement.  Also during these episodes he feels foggy sensation and has difficult time answering questions without complete loss of awareness.  In the ED, patient had blood pressure elevated up to 200s. 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 Admitted to hospitalist service Neurology consulted. 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   Subjective: Patient was seen and examined this morning.   He was on EEG monitoring.  Patient states that he continues to have right lower extremity stiffening and twitching almost every hour.  Neurology following.    Assessment/Plan: Complex partial seizure -Episodes of involuntary short lasting, frequent right leg tremors with foggy sensation could be complex partial seizure. -Neurology consult appreciated.  Per neurology, patient has no clear evidence of stroke. -Ongoing EEG monitoring. -Patient is currently on Keppra 1500 mg twice daily.  Neurology plans for 1 dose of fosphenytoin 1000 mg.  If seizures persist, noted a plan to load phenobarbital.  Continue long-term EEG monitoring.  Hypertensive  urgency Uncontrolled hypertension -Blood pressure average over 200s in the ED. -Gradually improved to normal with resumption of home meds that include Cardizem 180 mg daily, amlodipine 10 mg daily, hydralazine 25 mg 3 times daily, -Continue to monitor  ESRD HD MWF -Nephrology following -Continue Renvela and sodium bicarb.    Uncontrolled type 2 diabetes mellitus with hyperglycemia -A1c 10.6 on 10/23.   -Blood sugar level elevated to over 400 this morning. -Home meds include Tresiba 22 units daily, and Tradjenta 5 mg daily. -Currently continued on the same dose of insulin with Semglee 22 units and Tradjenta 5 mg daily.  Blood sugar levels however is running elevated more than 200 consistently. -I will add short acting insulin premeal 3 units 3 times daily. Recent Labs  Lab 08/08/21 1145 08/08/21 1608 08/08/21 2233 08/09/21 0826 08/09/21 1158  GLUCAP 278* 199* 297* 243* 327*   Hyperlipidemia -Continue aspirin 81 mg daily, Lipitor 40 mg daily   Mobility: Lives alone, uses a walker.  PT eval obtained.  Home health PT recommended. Code Status:   Code Status: Full Code  Nutritional status: Body mass index is 20.59 kg/m.     Diet:  Diet Order             Diet heart healthy/carb modified Room service appropriate? Yes; Fluid consistency: Thin  Diet effective now                  DVT prophylaxis:  heparin injection 5,000 Units Start: 08/08/21 1400   Antimicrobials: None Fluid: None Consultants: Neurology Family Communication: None at bedside  Status is: Observation  Remains inpatient appropriate because: Ongoing  long-term EEG.  Dispo: The patient is from: Home              Anticipated d/c is to: Hopefully home in 1 to 2 days with home with PT              Patient currently is not medically stable to d/c.   Difficult to place patient No     Infusions:   sodium chloride 250 mL (08/08/21 2216)   levETIRAcetam 1,500 mg (08/09/21 1141)    Scheduled Meds:   amLODipine  10 mg Oral Daily   aspirin EC  81 mg Oral Daily   atorvastatin  40 mg Oral QHS   Chlorhexidine Gluconate Cloth  6 each Topical Daily   Chlorhexidine Gluconate Cloth  6 each Topical Q0600   diltiazem  180 mg Oral Daily   [START ON 08/10/2021] heparin  2,500 Units Dialysis Once in dialysis   heparin  5,000 Units Subcutaneous Q8H   hydrALAZINE  25 mg Oral Q8H   insulin aspart  0-5 Units Subcutaneous QHS   insulin aspart  0-6 Units Subcutaneous TID WC   insulin aspart  3 Units Subcutaneous TID WC   insulin glargine-yfgn  22 Units Subcutaneous Daily   linagliptin  5 mg Oral Daily   sevelamer carbonate  2,400 mg Oral TID WC   sodium chloride flush  3 mL Intravenous Q12H    Antimicrobials: Anti-infectives (From admission, onward)    None       PRN meds: sodium chloride, acetaminophen **OR** acetaminophen, senna, sevelamer carbonate, sodium chloride flush   Objective: Vitals:   08/08/21 2008 08/09/21 0606  BP: 122/72 (!) 160/87  Pulse: 80 79  Resp: 18 18  Temp: 97.8 F (36.6 C) 98.4 F (36.9 C)  SpO2: 100% 100%    Intake/Output Summary (Last 24 hours) at 08/09/2021 1321 Last data filed at 08/08/2021 2216 Gross per 24 hour  Intake 240 ml  Output --  Net 240 ml   Filed Weights   08/07/21 1624 08/08/21 0100  Weight: 63.5 kg 65.1 kg   Weight change:  Body mass index is 20.59 kg/m.   Physical Exam: General exam: Pleasant, elderly African-American male.  Not in physical distress Skin: No rashes, lesions or ulcers. HEENT: Atraumatic, normocephalic, no obvious bleeding Lungs: Clear to auscultation bilaterally CVS: Regular rate and rhythm, no murmur GI/Abd soft, nontender, nondistended, bowel sound present CNS: Alert, awake, oriented x3 Psychiatry: Depressed look Extremities: No pedal edema, no calf tenderness  Data Review: I have personally reviewed the laboratory data and studies available.  Recent Labs  Lab 08/04/21 1528 08/07/21 1846  08/09/21 0146  WBC 5.2 5.1 4.9  NEUTROABS 3.6 3.1 2.7  HGB 11.5* 12.2* 10.6*  HCT 33.7* 35.0* 31.7*  MCV 91.1 90.7 91.6  PLT 170 158 161   Recent Labs  Lab 08/04/21 1528 08/07/21 1846 08/08/21 0225 08/09/21 0146  NA 131* 134* 134* 134*  K 4.2 4.1 4.0 4.1  CL 92* 95* 98 98  CO2 22 23 21* 22  GLUCOSE 582* 373* 369* 243*  BUN 61* 62* 59* 71*  CREATININE 9.50* 9.04* 9.78* 10.93*  CALCIUM 9.2 9.3 9.1 8.8*  MG 2.4  --   --   --     F/u labs ordered Unresulted Labs (From admission, onward)     Start     Ordered   08/09/21 0821  Hepatitis B surface antibody,quantitative  (New Admission Hemo Labs (Hepatitis B))  Once,   R  08/09/21 0821   08/09/21 0500  CBC with Differential/Platelet  Daily,   R      08/08/21 1140   08/09/21 4445  Basic metabolic panel  Daily,   R      08/08/21 1140            Signed, Terrilee Croak, MD [facility] 08/09/2021

## 2021-08-09 NOTE — Procedures (Addendum)
Patient Name: Jonathan Kane  MRN: [id number]  Epilepsy Attending: Lora Havens  Referring Physician/Provider: Beulah Gandy, NP Duration: 08/08/2021 1809 to 08/09/2021 1330, 08/09/2021 2041- 08/10/2021 0400   Patient history: 75 y.o. male with PMHx DM2 (nonadherent), ESRD on HD M/W/F, HTN (uncontrolled) with seizure-like activity in the right lower extremity without loss of awareness. EEG to evaluate for seizure.   Level of alertness: Awake, asleep   AEDs during EEG study:  LEV   Technical aspects: This EEG study was done with scalp electrodes positioned according to the 10-20 International system of electrode placement. Electrical activity was acquired at a sampling rate of 500Hz  and reviewed with a high frequency filter of 70Hz  and a low frequency filter of 1Hz . EEG data were recorded continuously and digitally stored.    Description: The posterior dominant rhythm consists of 9 Hz activity of moderate voltage (25-35 uV) seen predominantly in posterior head regions, symmetric and reactive to eye opening and eye closing. Sleep was characterized by vertex waves, sleep spindles (12 to 14 Hz), maximal frontocentral region.  Hyperventilation and photic stimulation were not performed.   Event button was pressed on 08/08/2021 at 2245 and on 08/09/2021 at 0610, 0844 and 1023 for right leg twitching.  Concomitant EEG before, during and after the event did not show any EEG changes suggest seizure.  Clinical correlation recommended not immediately to suggest needed.  Patient went for hemodialysis and therefore study was unhooked from 1330 to 2041.   IMPRESSION: This study is within normal limits. No seizures or epileptiform discharges were seen throughout the recording.  Four events were recorded as described above during which patient had right leg twitching without concomitant EEG change. However, focal motor seizures may not be seen on scalp EEG.  Therefore clinical correlation is recommended.    [name] [name]

## 2021-08-09 NOTE — Progress Notes (Addendum)
[facility] Progress Note   Subjective:   Pt seen in room, seated in chair with EEG leads in place. Denies SOB, CP, palpitations, dizziness, abdominal pain and nausea.   Objective Vitals:   08/08/21 1145 08/08/21 1607 08/08/21 2008 08/09/21 0606  BP: 121/79 94/60 122/72 (!) 160/87  Pulse:   80 79  Resp: 16 17 18 18   Temp: 98.2 F (36.8 C) 98.2 F (36.8 C) 97.8 F (36.6 C) 98.4 F (36.9 C)  TempSrc: Oral Oral Oral Oral  SpO2:   100% 100%  Weight:      Height:       Physical Exam General: WDWN male, alert and in NAD Heart: RRR, no murmur Lungs: CTA bilaterally Abdomen: Soft, non-tender, non-distended, +BS Extremities: No edema b/l lower extremities Dialysis Access:  [facility]  Additional Objective Labs: Basic Metabolic Panel: Recent Labs  Lab 08/07/21 1846 08/08/21 0225 08/09/21 0146  NA 134* 134* 134*  K 4.1 4.0 4.1  CL 95* 98 98  CO2 23 21* 22  GLUCOSE 373* 369* 243*  BUN 62* 59* 71*  CREATININE 9.04* 9.78* 10.93*  CALCIUM 9.3 9.1 8.8*   Liver Function Tests: Recent Labs  Lab 08/04/21 1528 08/07/21 1846  AST 18 18  ALT 15 16  ALKPHOS 76 70  BILITOT 0.6 0.5  PROT 8.3* 9.1*  ALBUMIN 3.9 4.6   No results for input(s): LIPASE, AMYLASE in the last 168 hours. CBC: Recent Labs  Lab 08/04/21 1528 08/07/21 1846 08/09/21 0146  WBC 5.2 5.1 4.9  NEUTROABS 3.6 3.1 2.7  HGB 11.5* 12.2* 10.6*  HCT 33.7* 35.0* 31.7*  MCV 91.1 90.7 91.6  PLT 170 158 161   Blood Culture    Component Value Date/Time   SDES BLOOD RIGHT HAND 06/03/2021 0515   SPECREQUEST  06/03/2021 0515    BOTTLES DRAWN AEROBIC AND ANAEROBIC Blood Culture adequate volume   CULT  06/03/2021 0515    NO GROWTH 5 DAYS Performed at [facility], [place] [address], [place], [place] [address]    REPTSTATUS 06/08/2021 FINAL 06/03/2021 0515    Cardiac Enzymes: No results for input(s): CKTOTAL, CKMB, CKMBINDEX, TROPONINI in the last 168 hours. CBG: Recent Labs  Lab  08/08/21 0834 08/08/21 1145 08/08/21 1608 08/08/21 2233 08/09/21 0826  GLUCAP 424* 278* 199* 297* 243*   Iron Studies: No results for input(s): IRON, TIBC, TRANSFERRIN, FERRITIN in the last 72 hours. @lablastinr3 @ Studies/Results: CT Head Wo Contrast  Result Date: 08/07/2021 CLINICAL DATA:  Right leg twitching for 3-4 days. EXAM: CT HEAD WITHOUT CONTRAST TECHNIQUE: Contiguous axial images were obtained from the base of the skull through the vertex without intravenous contrast. COMPARISON:  CT head 05/28/2021 FINDINGS: Brain: No evidence of acute infarction, hemorrhage, hydrocephalus, extra-axial collection or mass lesion/mass effect. Unchanged encephalomalacia in the frontal lobes and left temporal lobe. Hypodensities in the periventricular and subcortical white matter, consistent with chronic small vessel ischemic changes. Generalized parenchymal volume loss with associated enlargement of the lateral ventricles. Vascular: No hyperdense vessel or unexpected calcification. Skull: Normal. Negative for fracture or focal lesion. Sinuses/Orbits: No acute finding. Other: None. IMPRESSION: No acute intracranial abnormality. 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 Electronically Signed   By: Ileana Roup M.D.   On: 08/07/2021 19:24   MR BRAIN WO CONTRAST  Result Date: 08/08/2021 CLINICAL DATA:  75 year old male with neurologic deficit. EXAM: MRI HEAD WITHOUT CONTRAST TECHNIQUE: Multiplanar, multiecho pulse sequences of the brain and surrounding structures were obtained without intravenous  contrast. COMPARISON:  Head CT 08/07/2021.  Brain MRI 06/02/2021 and earlier. FINDINGS: Brain: Fairly extensive chronic bilateral anterior frontal lobe and left temporal lobe encephalomalacia redemonstrated. Mild ex vacuo ventricular enlargement. Superimposed chronic confluent bilateral white matter T2 and FLAIR hyperintensity. There is a small area of possible mildly restricted white matter diffusion in the  right frontal lobe on series 5, image 97, versus T2 shine through. No other restricted diffusion. No other new signal abnormality. No midline shift, mass effect, evidence of mass lesion, ventriculomegaly, extra-axial collection or acute intracranial hemorrhage. Cervicomedullary junction and pituitary are within normal limits. Little to no chronic cerebral blood products. Deep gray matter nuclei, brainstem and cerebellum remain within normal limits for age. Vascular: Major intracranial vascular flow voids are stable. Skull and upper cervical spine: Stable. Visualized bone marrow signal is within normal limits. Sinuses/Orbits: Stable, negative. Other: Visible internal auditory structures appear normal. Mastoids remain clear. Stable visible scalp and face. IMPRESSION: 1. Questionable small acute to subacute white matter infarct in the right frontal lobe, versus T2-shine-through in the setting of advanced underlying chronic white matter disease. No associated hemorrhage or mass effect. 2. Otherwise continued stable MRI appearance of the brain. 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 Electronically Signed   By: Genevie Ann M.D.   On: 08/08/2021 06:54   DG Chest Portable 1 View  Result Date: 08/07/2021 CLINICAL DATA:  Right leg twitching. EXAM: PORTABLE CHEST 1 VIEW COMPARISON:  June 01, 2021 FINDINGS: There is stable right-sided venous catheter positioning. Multiple overlying radiopaque cardiac lead wires are seen. The heart size and mediastinal contours are within normal limits. Both lungs are clear. The visualized skeletal structures are unremarkable. IMPRESSION: No active disease. Electronically Signed   By: Virgina Norfolk M.D.   On: 08/07/2021 19:23   EEG adult  Result Date: 08/08/2021 Lora Havens, MD     08/08/2021  8:59 PM Patient Name: MICHELLE VANHISE MRN: [id number] Epilepsy Attending: Lora Havens Referring Physician/Provider: Dr Terrilee Croak Date: 08/08/2021  Duration: 23.04 mins Patient history: 75 y.o. male with PMHx DM2 (nonadherent), ESRD on HD M/W/F, HTN (uncontrolled) with seizure-like activity in the right lower extremity without loss of awareness. EEG to evaluate for seizure. Level of alertness: Awake AEDs during EEG study:  LEV Technical aspects: This EEG study was done with scalp electrodes positioned according to the 10-20 International system of electrode placement. Electrical activity was acquired at a sampling rate of 500Hz  and reviewed with a high frequency filter of 70Hz  and a low frequency filter of 1Hz . EEG data were recorded continuously and digitally stored. Description: The posterior dominant rhythm consists of 9 Hz activity of moderate voltage (25-35 uV) seen predominantly in posterior head regions, symmetric and reactive to eye opening and eye closing.  Hyperventilation and photic stimulation were not performed.   IMPRESSION: This study is within normal limits. No seizures or epileptiform discharges were seen throughout the recording. Priyanka [name]   Overnight EEG with video  Result Date: 08/09/2021 Lora Havens, MD     08/09/2021  8:40 AM Patient Name: WOOD NOVACEK MRN: [id number] Epilepsy Attending: Lora Havens Referring Physician/Provider: Beulah Gandy, NP Duration: 08/08/2021 1809 to 08/09/2021 0830  Patient history: 75 y.o. male with PMHx DM2 (nonadherent), ESRD on HD M/W/F, HTN (uncontrolled) with seizure-like activity in the right lower extremity without loss of awareness. EEG to evaluate for seizure.  Level of alertness: Awake, asleep  AEDs during EEG study:  LEV  Technical aspects:  This EEG study was done with scalp electrodes positioned according to the 10-20 International system of electrode placement. Electrical activity was acquired at a sampling rate of 500Hz  and reviewed with a high frequency filter of 70Hz  and a low frequency filter of 1Hz . EEG data were recorded continuously and digitally stored.  Description:  The posterior dominant rhythm consists of 9 Hz activity of moderate voltage (25-35 uV) seen predominantly in posterior head regions, symmetric and reactive to eye opening and eye closing. Sleep was characterized by vertex waves, sleep spindles (12 to 14 Hz), maximal frontocentral region.  Hyperventilation and photic stimulation were not performed. Event button was pressed on 08/08/2021 at 2245 and on 08/09/2021 at 0610 for right leg twitching.  Concomitant EEG before, during and after the event did not show any EEG changes suggest seizure.  Clinical correlation recommended not immediately to suggest needed.  IMPRESSION: This study is within normal limits. No seizures or epileptiform discharges were seen throughout the recording. Two events were recorded as described above during which patient had right leg twitching without concomitant EEG change. However, focal motor seizures may not be seen on scalp EEG.  Therefore clinical correlation is recommended.  Priyanka [name]   Medications:  sodium chloride 250 mL (08/08/21 2216)   fosPHENYtoin (CEREBYX) IV     levETIRAcetam 1,500 mg (08/08/21 2219)    amLODipine  10 mg Oral Daily   aspirin EC  81 mg Oral Daily   atorvastatin  40 mg Oral QHS   Chlorhexidine Gluconate Cloth  6 each Topical Daily   Chlorhexidine Gluconate Cloth  6 each Topical Q0600   diltiazem  180 mg Oral Daily   [START ON 08/10/2021] heparin  2,500 Units Dialysis Once in dialysis   heparin  5,000 Units Subcutaneous Q8H   hydrALAZINE  25 mg Oral Q8H   insulin aspart  0-5 Units Subcutaneous QHS   insulin aspart  0-6 Units Subcutaneous TID WC   insulin glargine-yfgn  22 Units Subcutaneous Daily   linagliptin  5 mg Oral Daily   sevelamer carbonate  2,400 mg Oral TID WC   sodium chloride flush  3 mL Intravenous Q12H    Dialysis Orders: [facility] M[facility]  3h 75min   400/1.5   65.5kg  2/2.5 bath  Heparin 2500    - venofer 50 weekly  - mircera 75 ug q2, last 10/21  Assessment/Plan: HTN'sive  urgency - BP's better today.  R/o seizures - R leg twitching at home, neurology consulting, EEG ongoing ESRD - on HD MWF. HD today per regular schedule.  HTN/ volume - at dry wt, no vol excess on exam. BP's down w/ medications. UF 1-1.5 L w/ HD today, possibly will need to lower dry wt.  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  Chart reviewed.  I agree with above. Blood pressure is much better.  Currently on EEG monitoring.  Plan for regular dialysis today.  Clinically looks stable.  Katheran James, MD [facility].

## 2021-08-10 DIAGNOSIS — R569 Unspecified convulsions: Secondary | ICD-10-CM | POA: Diagnosis not present

## 2021-08-10 DIAGNOSIS — I160 Hypertensive urgency: Secondary | ICD-10-CM | POA: Diagnosis not present

## 2021-08-10 LAB — BASIC METABOLIC PANEL
Anion gap: 8 (ref 5–15)
BUN: 23 mg/dL (ref 8–23)
CO2: 28 mmol/L (ref 22–32)
Calcium: 8.8 mg/dL — ABNORMAL LOW (ref 8.9–10.3)
Chloride: 98 mmol/L (ref 98–111)
Creatinine, Ser: 6.3 mg/dL — ABNORMAL HIGH (ref 0.61–1.24)
GFR, Estimated: 9 mL/min — ABNORMAL LOW (ref >60)
Glucose, Bld: 195 mg/dL — ABNORMAL HIGH (ref 70–99)
Potassium: 4.3 mmol/L (ref 3.5–5.1)
Sodium: 134 mmol/L — ABNORMAL LOW (ref 135–145)

## 2021-08-10 LAB — CBC WITH DIFFERENTIAL/PLATELET
Abs Immature Granulocytes: 0.01 10*3/uL (ref 0.00–0.07)
Basophils Absolute: 0.1 10*3/uL (ref 0.0–0.1)
Basophils Relative: 1 %
Eosinophils Absolute: 0.3 10*3/uL (ref 0.0–0.5)
Eosinophils Relative: 5 %
HCT: 34.2 % — ABNORMAL LOW (ref 39.0–52.0)
Hemoglobin: 11.5 g/dL — ABNORMAL LOW (ref 13.0–17.0)
Immature Granulocytes: 0 %
Lymphocytes Relative: 19 %
Lymphs Abs: 1.1 10*3/uL (ref 0.7–4.0)
MCH: 31.1 pg (ref 26.0–34.0)
MCHC: 33.6 g/dL (ref 30.0–36.0)
MCV: 92.4 fL (ref 80.0–100.0)
Monocytes Absolute: 0.7 10*3/uL (ref 0.1–1.0)
Monocytes Relative: 13 %
Neutro Abs: 3.4 10*3/uL (ref 1.7–7.7)
Neutrophils Relative %: 62 %
Platelets: 156 10*3/uL (ref 150–400)
RBC: 3.7 MIL/uL — ABNORMAL LOW (ref 4.22–5.81)
RDW: 14.2 % (ref 11.5–15.5)
WBC: 5.5 10*3/uL (ref 4.0–10.5)
nRBC: 0 % (ref 0.0–0.2)

## 2021-08-10 LAB — GLUCOSE, CAPILLARY
Glucose-Capillary: 165 mg/dL — ABNORMAL HIGH (ref 70–99)
Glucose-Capillary: 133 mg/dL — ABNORMAL HIGH (ref 70–99)
Glucose-Capillary: 80 mg/dL (ref 70–99)

## 2021-08-10 LAB — HEPATITIS B SURFACE ANTIBODY, QUANTITATIVE: Hep B S AB Quant (Post): 47.5 m[IU]/mL (ref >9.9)

## 2021-08-10 MED ORDER — PHENYTOIN 50 MG PO CHEW
50.0000 mg | CHEWABLE_TABLET | Freq: Three times a day (TID) | ORAL | Status: DC
  Administered 2021-08-10 (×2): 50 mg via ORAL
  Filled 2021-08-10 (×3): qty 1

## 2021-08-10 MED ORDER — CHLORHEXIDINE GLUCONATE CLOTH 2 % EX PADS
6.0000 | MEDICATED_PAD | Freq: Every day | CUTANEOUS | Status: DC
  Administered 2021-08-12: 6 via TOPICAL

## 2021-08-10 MED ORDER — INSULIN GLARGINE-YFGN 100 UNIT/ML ~~LOC~~ SOLN: 25.0000 [IU] | Freq: Every day | SUBCUTANEOUS | Status: DC

## 2021-08-10 MED ORDER — INSULIN GLARGINE-YFGN 100 UNIT/ML ~~LOC~~ SOLN
28.0000 [IU] | Freq: Every day | SUBCUTANEOUS | Status: DC
  Administered 2021-08-11 – 2021-08-12 (×2): 28 [IU] via SUBCUTANEOUS
  Filled 2021-08-10 (×3): qty 0.28

## 2021-08-10 NOTE — TOC Initial Note (Signed)
Transition of Care [facility]) - Initial/Assessment Note    Patient Details  Name: Jonathan Kane MRN: [id number] Date of Birth: 06/25/1946  Transition of Care [facility]) CM/SW Contact:    Bethena Roys, RN Phone Number: 08/10/2021, 1:34 PM  Clinical Narrative:  Risk for readmission assessment completed. Case Manager spoke with patient and he is agreeable to home health services with [facility]. Case Manager made the referral with [facility] and start of care to begin within 24-48 hours post transition home. Case Manager will continue to follow for additional needs.                 Expected Discharge Plan: [place] Barriers to Discharge: No Barriers Identified   Patient Goals and CMS Choice Patient states their goals for this hospitalization and ongoing recovery are:: to return home with home health services. CMS Medicare.gov Compare Post Acute Care list provided to:: Patient Choice offered to / list presented to : Patient  Expected Discharge Plan and Services Expected Discharge Plan: [place] In-house Referral: NA Discharge Planning Services: CM Consult Post Acute Care Choice: Home Health                     DME Agency: NA       HH Arranged: PT HH Agency: [facility] Date [facility] Agency Contacted: 08/09/21 Time HH Agency Contacted: 65 Representative spoke with at [place]: Levada Dy  Prior Living Arrangements/Services   Lives with:: Self Varney Biles Niece checks in on the patient.) Patient language and need for interpreter reviewed:: Yes        Need for Family Participation in Patient Care: Yes (Comment) Care giver support system in place?: Yes (comment)   Criminal Activity/Legal Involvement Pertinent to Current Situation/Hospitalization: No - Comment as needed  Activities of Daily Living Home Assistive Devices/Equipment: Walker (specify type) ADL Screening (condition at time of admission) Patient's cognitive  ability adequate to safely complete daily activities?: Yes Is the patient deaf or have difficulty hearing?: Yes Does the patient have difficulty seeing, even when wearing glasses/contacts?: Yes Does the patient have difficulty concentrating, remembering, or making decisions?: Yes Patient able to express need for assistance with ADLs?: Yes Does the patient have difficulty dressing or bathing?: No Independently performs ADLs?: Yes (appropriate for developmental age) Does the patient have difficulty walking or climbing stairs?: Yes Weakness of Legs: Right Weakness of Arms/Hands: None  Permission Sought/Granted Permission sought to share information with : Case Manager, Family Supports, Customer service manager Permission granted to share information with : Yes, Verbal Permission Granted     Permission granted to share info w AGENCY: [facility]        Emotional Assessment Appearance:: Appears stated age Attitude/Demeanor/Rapport: Engaged Affect (typically observed): Appropriate Orientation: : Oriented to  Time, Oriented to Place, Oriented to Self Alcohol / Substance Use: Not Applicable Psych Involvement: No (comment)  Admission diagnosis:  Hypertensive crisis [I16.9] Patient Active Problem List   Diagnosis Date Noted   Seizure ([place]) 08/08/2021   Hypertensive urgency 08/08/2021   Uncontrolled type 2 diabetes mellitus with hyperglycemia ([place]) 08/08/2021   ESRD on hemodialysis ([place]) 08/08/2021   Hypertensive crisis 08/07/2021   MRSA bacteremia 06/02/2021   Malnutrition of moderate degree 06/01/2021   DKA (diabetic ketoacidosis) ([place]) 05/28/2021   Status epilepticus ([place])    Memory loss 04/13/2021   Cerebrovascular accident (CVA) ([place]) 01/12/2021   DM type 2 with diabetic peripheral neuropathy (  [place]) 01/12/2021   Normal anion gap metabolic acidosis 62/22/9798   Secondary hyperparathyroidism of renal origin ([place]) 92/08/9416   Acute metabolic encephalopathy  40/81/4481   Acute on chronic renal failure ([place]) 11/07/2019   CKD (chronic kidney disease) stage 5, GFR less than 15 ml/min (HCC) 09/02/2019   CKD (chronic kidney disease) stage 4, GFR 15-29 ml/min (HCC) 08/01/2019   Paronychia of fifth toe of left foot 05/29/2019   Microalbuminuria due to type 2 diabetes mellitus ([place]) 02/14/2019   Posterior vitreous detachment of both eyes 01/17/2019   Presbyopia of both eyes 01/17/2019   Pseudophakia of both eyes 01/17/2019   Abnormal finding on diagnostic imaging of right testicle 09/14/2018   Epididymitis, left 09/14/2018   Swelling of left testicle 08/23/2018   Primary open angle glaucoma (POAG) of both eyes, mild stage 01/02/2018   Benign hypertension with chronic kidney disease, stage V ([place]) 11/30/2017   Proteinuria 11/30/2017   Mouth burn, initial encounter 11/10/2017   Vitamin D deficiency, unspecified 11/10/2017   Erectile dysfunction associated with type 2 diabetes mellitus ([place]) 08/29/2017   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 ([place]) 08/16/2017   Hypoglycemia 08/16/2017   PCO (posterior capsular opacification), left 03/03/2017   Hypermetropia of both eyes 03/03/2017   Cocaine dependence in remission ([place]) 07/26/2016   Herpes simplex infection 07/26/2016   CKD stage 4 due to type 2 diabetes mellitus ([place]) 07/07/2016   Noncompliance with diabetes treatment 07/07/2016   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 03/21/2016   Combined arterial insufficiency and corporo-venous occlusive erectile dysfunction 03/04/2016   Allergic rhinitis with postnasal drip 02/24/2016   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   Type 2 diabetes mellitus ([place]) 02/23/2016   Essential hypertension 02/23/2016   Musculoskeletal chest pain 02/23/2016   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[place]) 12/09/2015   Polyneuropathy, unspecified 11/12/2013   Constipation 03/23/2013    Hallucinations 03/23/2013   Polyneuropathy in diabetes ([place]) 03/23/2013   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[place]) 08/30/2011   Diabetes mellitus type II, controlled ([place]) 08/30/2011   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   PCP:  Cipriano Mile, NP Pharmacy:   [facility] [place], [place] - [address] [place] [address] Phone: [phone] Fax: [phone]  [place], [place] - [address] [place] [address] Phone: [phone] Fax: [phone]  Readmission Risk Interventions Readmission Risk Prevention Plan 08/10/2021  Transportation Screening Complete  Medication Review ([place]) Complete  PCP or Specialist appointment within 3-5 days of discharge Complete  HRI or [place] Complete  SW Recovery Care/Counseling Consult Complete  [place] Not Applicable  Some recent data might be hidden

## 2021-08-10 NOTE — Progress Notes (Signed)
Mobility Specialist Progress Note:   08/10/21 1515  Therapy Vitals  Pulse Rate 88  Mobility  Activity Ambulated in hall  Level of Assistance Contact guard assist, steadying assist  Assistive Device Front wheel walker  Distance Ambulated (ft) 470 ft  Mobility Ambulated with assistance in hallway  Mobility Response Tolerated well  Mobility performed by Mobility specialist  $Mobility charge 1 Mobility   Pre Mobility: HR 88 bpm During Mobility: HR 96 bpm Post Mobility: HR 91 bpm  Pt had major LOB when standing from EOB d/t "RLE seizure", required modA to correct. Asx during ambulation. Left sitting EOB.  Nelta Numbers Mobility Specialist  Phone [phone]

## 2021-08-10 NOTE — Progress Notes (Signed)
PROGRESS NOTE  Jonathan Kane  DOB: 1946-05-29  PCP: Cipriano Mile, NP OEU:[id number]  DOA: 08/07/2021  LOS: 2 days  Hospital Day: 4   Chief Complaint  Patient presents with   Leg Pain    Brief narrative: Jonathan Kane is a 75 y.o. male with PMH significant for ESRD HD MWF DM2, uncontrolled HTN, HLD and no history of seizure disorder who lives at home alone and has been lately requiring a walker to ambulate. Patient presented to the ED 10/22 with complaint of intermittent episodes of right leg tremors and involuntary uncontrollable movements.  Episodes last about 1 to 2 minutes, symptoms ongoing and progressive for the last 3 to 4 days movement.  Also during these episodes he feels foggy sensation and has difficult time answering questions without complete loss of awareness.  In the ED, patient had blood pressure elevated up to 200s. 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 Admitted to hospitalist service Neurology consulted. 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   Subjective: Patient was seen and examined this morning.   Sitting up in bed.  Not in distress.   Neurology follow-up appreciated.  Assessment/Plan: Complex partial seizure -Episodes of involuntary short lasting, frequent right leg tremors with foggy sensation could be complex partial seizure. -Neurology consult appreciated.  Per neurology, patient has no clear evidence of stroke. -He underwent EEG that did not show any epileptiform discharge.  However he continues to have episodes of right leg tremors.  Per neurology it is improving after patient was loaded on Dilantin yesterday.  Neurology plans to put him on maintenance Dilantin.  If no more episodes tonight, can DC tomorrow from neuro  standpoint.  Hypertensive urgency Uncontrolled hypertension -Blood pressure was elevated over 200s in the ED. -Gradually improved to normal with resumption of home meds that include Cardizem 180 mg daily, amlodipine 10 mg daily, hydralazine 25 mg 3 times daily, -Continue to monitor  ESRD HD MWF -Nephrology following -Continue Renvela and sodium bicarb.    Uncontrolled type 2 diabetes mellitus with hyperglycemia -A1c 10.6 on 10/23.   -Blood sugar level elevated to over 200 consistently -Home meds include Tresiba 22 units daily, and Tradjenta 5 mg daily. -Currently continued on the same dose of insulin with Semglee 22 units, short-acting insulin Premeal 3 units 3 times daily and Tradjenta 5 mg daily.  Blood sugar levels however is running elevated more than 200 consistently.  -I will increase Semglee to 28 units from tomorrow. Recent Labs  Lab 08/08/21 1608 08/08/21 2233 08/09/21 0826 08/09/21 1158 08/09/21 2145  GLUCAP 199* 297* 243* 327* 224*   Hyperlipidemia -Continue aspirin 81 mg daily, Lipitor 40 mg daily   Mobility: Lives alone, uses a walker.  PT eval obtained.  Home health PT recommended. Code Status:   Code Status: Full Code  Nutritional status: Body mass index is 20.88 kg/m.     Diet:  Diet Order             Diet heart healthy/carb modified Room service appropriate? Yes; Fluid consistency: Thin  Diet effective now                  DVT prophylaxis:  heparin injection 5,000 Units Start: 08/08/21 1400   Antimicrobials: None Fluid: None Consultants: Neurology Family Communication: None at bedside  Status is: Observation  Remains inpatient appropriate because: Ongoing titration of antibiotic medication by neurology.  Dispo: The patient is from: Home              Anticipated d/c is to: Hopefully home after neurology clearance tomorrow              Patient currently is not medically stable to d/c.   Difficult to place patient No     Infusions:    sodium chloride Stopped (08/09/21 2310)   levETIRAcetam 1,500 mg (08/10/21 0840)    Scheduled Meds:  amLODipine  10 mg Oral Daily   aspirin EC  81 mg Oral Daily   atorvastatin  40 mg Oral QHS   Chlorhexidine Gluconate Cloth  6 each Topical Daily   Chlorhexidine Gluconate Cloth  6 each Topical Q0600   Chlorhexidine Gluconate Cloth  6 each Topical Q0600   diltiazem  180 mg Oral Daily   heparin  5,000 Units Subcutaneous Q8H   hydrALAZINE  25 mg Oral Q8H   insulin aspart  0-5 Units Subcutaneous QHS   insulin aspart  0-6 Units Subcutaneous TID WC   insulin aspart  3 Units Subcutaneous TID WC   [START ON 08/11/2021] insulin glargine-yfgn  28 Units Subcutaneous Daily   linagliptin  5 mg Oral Daily   phenytoin  50 mg Oral TID   sevelamer carbonate  2,400 mg Oral TID WC   sodium chloride flush  3 mL Intravenous Q12H    Antimicrobials: Anti-infectives (From admission, onward)    None       PRN meds: sodium chloride, acetaminophen **OR** acetaminophen, senna, sevelamer carbonate, sodium chloride flush   Objective: Vitals:   08/10/21 0636 08/10/21 0830  BP: 132/84 (!) 142/78  Pulse: 78   Resp:    Temp:    SpO2: 100%     Intake/Output Summary (Last 24 hours) at 08/10/2021 1235 Last data filed at 08/10/2021 0339 Gross per 24 hour  Intake 660.43 ml  Output 2300 ml  Net -1639.57 ml   Filed Weights   08/08/21 0100 08/09/21 1403 08/09/21 1845  Weight: 65.1 kg 67.4 kg 66 kg   Weight change:  Body mass index is 20.88 kg/m.   Physical Exam: General exam: Pleasant, elderly African-American male.  Not in physical distress Skin: No rashes, lesions or ulcers. HEENT: Atraumatic, normocephalic, no obvious bleeding Lungs: Clear to auscultation bilaterally CVS: Regular rate and rhythm, no murmur GI/Abd soft, nontender, nondistended, bowel sound present CNS: Alert, awake, oriented x3 Psychiatry: Depressed look Extremities: No pedal edema, no calf tenderness  Data Review: I  have personally reviewed the laboratory data and studies available.  Recent Labs  Lab 08/04/21 1528 08/07/21 1846 08/09/21 0146 08/10/21 0310  WBC 5.2 5.1 4.9 5.5  NEUTROABS 3.6 3.1 2.7 3.4  HGB 11.5* 12.2* 10.6* 11.5*  HCT 33.7* 35.0* 31.7* 34.2*  MCV 91.1 90.7 91.6 92.4  PLT 170 158 161 156   Recent Labs  Lab 08/04/21 1528 08/07/21 1846 08/08/21 0225 08/09/21 0146 08/10/21 0310  NA 131* 134* 134* 134* 134*  K 4.2 4.1 4.0 4.1 4.3  CL 92* 95* 98 98 98  CO2 22 23 21* 22 28  GLUCOSE 582* 373* 369* 243* 195*  BUN 61* 62* 59* 71* 23  CREATININE 9.50* 9.04* 9.78* 10.93* 6.30*  CALCIUM 9.2 9.3 9.1 8.8* 8.8*  MG 2.4  --   --   --   --     F/u labs ordered Unresulted Labs (From admission,  onward)     Start     Ordered   08/09/21 0500  CBC with Differential/Platelet  Daily,   R      08/08/21 1140   08/09/21 5397  Basic metabolic panel  Daily,   R      08/08/21 1140   Signed and Held  Renal function panel  Once,   R       Question:  Specimen collection method  Answer:  Lab=Lab collect   Signed and Held   Signed and Held  CBC  Once,   R       Question:  Specimen collection method  Answer:  Lab=Lab collect   Signed and Held            Signed, Terrilee Croak, MD [facility] 08/10/2021

## 2021-08-10 NOTE — Procedures (Addendum)
Patient Name: Jonathan Kane  MRN: [id number]  Epilepsy Attending: Lora Havens  Referring Physician/Provider: Beulah Gandy, NP Duration: 08/10/2021 0400 to 08/10/2021 0750   Patient history: 75 y.o. male with PMHx DM2 (nonadherent), ESRD on HD M/W/F, HTN (uncontrolled) with seizure-like activity in the right lower extremity without loss of awareness. EEG to evaluate for seizure.   Level of alertness: Awake, asleep   AEDs during EEG study:  LEV   Technical aspects: This EEG study was done with scalp electrodes positioned according to the 10-20 International system of electrode placement. Electrical activity was acquired at a sampling rate of 500Hz  and reviewed with a high frequency filter of 70Hz  and a low frequency filter of 1Hz . EEG data were recorded continuously and digitally stored.    Description: The posterior dominant rhythm consists of 9 Hz activity of moderate voltage (25-35 uV) seen predominantly in posterior head regions, symmetric and reactive to eye opening and eye closing. Sleep was characterized by vertex waves, sleep spindles (12 to 14 Hz), maximal frontocentral region.  Hyperventilation and photic stimulation were not performed.   Parts of study after 0500 were difficult to interpret as patient pulled out electrodes.    IMPRESSION: This study is within normal limits. No seizures or epileptiform discharges were seen throughout the recording.   Allia Wiltsey [name]

## 2021-08-10 NOTE — Progress Notes (Signed)
LTM D/C. No skin breakdown noted

## 2021-08-10 NOTE — Progress Notes (Signed)
Physical Therapy Treatment Patient Details Name: Jonathan Kane MRN: [id number] DOB: [birth year] Today's Date: 08/10/2021   History of Present Illness Pt adm 10/22 with intermittent RLE tremors and involuntary movements. 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 Neuro consult with focal seizures.  PMH - ESRD on HD MWF, DMII,, Hep C, seizure and HTN.    PT Comments    Patient progressing with mobility walking further distances and with less episodes of R LE involuntary spasms vs focal seizure.  Educated in weight bearing with standing still for improvement of symptoms.  Patient will continue to benefit from skilled PT in the acute setting.  Continue to recommend HHPT at d/c.    Recommendations for follow up therapy are one component of a multi-disciplinary discharge planning process, led by the attending physician.  Recommendations may be updated based on patient status, additional functional criteria and insurance authorization.  Follow Up Recommendations  Home health PT     Assistance Recommended at Discharge PRN  Equipment Recommendations  None recommended by PT    Recommendations for Other Services       Precautions / Restrictions Precautions Precautions: Fall Precaution Comments: focal seizures involving RLE     Mobility  Bed Mobility Overal bed mobility: Modified Independent                  Transfers Overall transfer level: Needs assistance Equipment used: Rolling walker (2 wheels) Transfers: Sit to/from Stand Sit to Stand: Supervision           General transfer comment: S for safety    Ambulation/Gait Ambulation/Gait assistance: Min guard Gait Distance (Feet): 400 Feet Assistive device: Rolling walker (2 wheels) Gait Pattern/deviations: Step-through pattern;Decreased stride length     General Gait Details: at times with increased speed when more weight on RW and patient pushing faster, cues for slower pace, more  weight in LE's  Had one spasm of R LE while ambulating and sat briefly, when he felt it coming on again cues for even weight bearing and standing still till it passes   Stairs             Wheelchair Mobility    Modified Rankin (Stroke Patients Only)       Balance Overall balance assessment: Needs assistance   Sitting balance-Leahy Scale: Good     Standing balance support: Bilateral upper extremity supported Standing balance-Leahy Scale: Poor Standing balance comment: S with UE support for balance                            Cognition Arousal/Alertness: Awake/alert Behavior During Therapy: WFL for tasks assessed/performed Overall Cognitive Status: Within Functional Limits for tasks assessed                                          Exercises      General Comments General comments (skin integrity, edema, etc.): reports walked just a bit ago with mobility      Pertinent Vitals/Pain Faces Pain Scale: Hurts little more Pain Location: R LE Pain Descriptors / Indicators: Spasm;Grimacing;Guarding Pain Intervention(s): Monitored during session;Repositioned;Other (comment) (cues for weight bearing)    Home Living                          Prior Function  PT Goals (current goals can now be found in the care plan section) Progress towards PT goals: Progressing toward goals    Frequency    Min 3X/week      PT Plan Current plan remains appropriate    Co-evaluation              AM-PAC PT "6 Clicks" Mobility   Outcome Measure  Help needed turning from your back to your side while in a flat bed without using bedrails?: None Help needed moving from lying on your back to sitting on the side of a flat bed without using bedrails?: None Help needed moving to and from a bed to a chair (including a wheelchair)?: A Little Help needed standing up from a chair using your arms (e.g., wheelchair or bedside chair)?:  None Help needed to walk in hospital room?: A Little Help needed climbing 3-5 steps with a railing? : A Little 6 Click Score: 21    End of Session   Activity Tolerance: Patient tolerated treatment well Patient left: in bed;with call bell/phone within reach;with bed alarm set   PT Visit Diagnosis: Other abnormalities of gait and mobility (R26.89);Other symptoms and signs involving the nervous system (R29.898)     Time: 7858-8502 PT Time Calculation (min) (ACUTE ONLY): 23 min  Charges:  $Gait Training: 23-37 mins                     [name], PT Acute Rehabilitation Services Pager:[phone] Office:[phone] 08/10/2021    [name] 08/10/2021, 5:37 PM

## 2021-08-10 NOTE — Progress Notes (Addendum)
[facility] Progress Note   Subjective:   Seen in room, eating breakfast. Reports HD went well yesterday. Denies SOB, CP, palpitations, dizziness, abdominal pain and nausea. Does have some SOB when leg twitching occurs.   Objective Vitals:   08/09/21 2100 08/10/21 0407 08/10/21 0636 08/10/21 0830  BP: 137/78 90/71 132/84 (!) 142/78  Pulse: 80 80 78   Resp: 16 15    Temp: 98 F (36.7 C) 98.5 F (36.9 C)    TempSrc: Oral Oral    SpO2: 99% 96% 100%   Weight:      Height:       Physical Exam General: WDWN male, alert and in NAD Heart: RRR, no murmur Lungs: CTA bilaterally Abdomen: Soft, non-tender, non-distended, +BS Extremities: No edema b/l lower extremities Dialysis Access:  [facility] [place] with dry bandage  Additional Objective Labs: Basic Metabolic Panel: Recent Labs  Lab 08/08/21 0225 08/09/21 0146 08/10/21 0310  NA 134* 134* 134*  K 4.0 4.1 4.3  CL 98 98 98  CO2 21* 22 28  GLUCOSE 369* 243* 195*  BUN 59* 71* 23  CREATININE 9.78* 10.93* 6.30*  CALCIUM 9.1 8.8* 8.8*   Liver Function Tests: Recent Labs  Lab 08/04/21 1528 08/07/21 1846  AST 18 18  ALT 15 16  ALKPHOS 76 70  BILITOT 0.6 0.5  PROT 8.3* 9.1*  ALBUMIN 3.9 4.6   No results for input(s): LIPASE, AMYLASE in the last 168 hours. CBC: Recent Labs  Lab 08/04/21 1528 08/07/21 1846 08/09/21 0146 08/10/21 0310  WBC 5.2 5.1 4.9 5.5  NEUTROABS 3.6 3.1 2.7 3.4  HGB 11.5* 12.2* 10.6* 11.5*  HCT 33.7* 35.0* 31.7* 34.2*  MCV 91.1 90.7 91.6 92.4  PLT 170 158 161 156   Blood Culture    Component Value Date/Time   SDES BLOOD RIGHT HAND 06/03/2021 0515   SPECREQUEST  06/03/2021 0515    BOTTLES DRAWN AEROBIC AND ANAEROBIC Blood Culture adequate volume   CULT  06/03/2021 0515    NO GROWTH 5 DAYS Performed at [facility], [place] [address] [place], [place] [address]    REPTSTATUS 06/08/2021 FINAL 06/03/2021 0515    Cardiac Enzymes: No results for input(s): CKTOTAL, CKMB, CKMBINDEX,  TROPONINI in the last 168 hours. CBG: Recent Labs  Lab 08/08/21 1608 08/08/21 2233 08/09/21 0826 08/09/21 1158 08/09/21 2145  GLUCAP 199* 297* 243* 327* 224*   Iron Studies: No results for input(s): IRON, TIBC, TRANSFERRIN, FERRITIN in the last 72 hours. @lablastinr3 @ Studies/Results: EEG adult  Result Date: 08/08/2021 Lora Havens, MD     08/08/2021  8:59 PM Patient Name: JESSTIN STUDSTILL MRN: [id number] Epilepsy Attending: Lora Havens Referring Physician/Provider: Dr Terrilee Croak Date: 08/08/2021 Duration: 23.04 mins Patient history: 75 y.o. male with PMHx DM2 (nonadherent), ESRD on HD M/W/F, HTN (uncontrolled) with seizure-like activity in the right lower extremity without loss of awareness. EEG to evaluate for seizure. Level of alertness: Awake AEDs during EEG study:  LEV Technical aspects: This EEG study was done with scalp electrodes positioned according to the 10-20 International system of electrode placement. Electrical activity was acquired at a sampling rate of 500Hz  and reviewed with a high frequency filter of 70Hz  and a low frequency filter of 1Hz . EEG data were recorded continuously and digitally stored. Description: The posterior dominant rhythm consists of 9 Hz activity of moderate voltage (25-35 uV) seen predominantly in posterior head regions, symmetric and reactive to eye opening and eye closing.  Hyperventilation and photic stimulation were  not performed.   IMPRESSION: This study is within normal limits. No seizures or epileptiform discharges were seen throughout the recording. Priyanka [name]   Overnight EEG with video  Result Date: 08/09/2021 Lora Havens, MD     08/10/2021  6:31 AM Patient Name: ADELBERT GASPARD MRN: [id number] Epilepsy Attending: Lora Havens Referring Physician/Provider: Beulah Gandy, NP Duration: 08/08/2021 1809 to 08/09/2021 1330, 08/09/2021 2041- 08/10/2021 0400  Patient history: 75 y.o. male with PMHx DM2 (nonadherent), ESRD on HD  M/W/F, HTN (uncontrolled) with seizure-like activity in the right lower extremity without loss of awareness. EEG to evaluate for seizure.  Level of alertness: Awake, asleep  AEDs during EEG study:  LEV  Technical aspects: This EEG study was done with scalp electrodes positioned according to the 10-20 International system of electrode placement. Electrical activity was acquired at a sampling rate of 500Hz  and reviewed with a high frequency filter of 70Hz  and a low frequency filter of 1Hz . EEG data were recorded continuously and digitally stored.  Description: The posterior dominant rhythm consists of 9 Hz activity of moderate voltage (25-35 uV) seen predominantly in posterior head regions, symmetric and reactive to eye opening and eye closing. Sleep was characterized by vertex waves, sleep spindles (12 to 14 Hz), maximal frontocentral region.  Hyperventilation and photic stimulation were not performed. Event button was pressed on 08/08/2021 at 2245 and on 08/09/2021 at 0610, 0844 and 1023 for right leg twitching.  Concomitant EEG before, during and after the event did not show any EEG changes suggest seizure.  Clinical correlation recommended not immediately to suggest needed. Patient went for hemodialysis and therefore study was unhooked from 1330 to 2041.  IMPRESSION: This study is within normal limits. No seizures or epileptiform discharges were seen throughout the recording. Four events were recorded as described above during which patient had right leg twitching without concomitant EEG change. However, focal motor seizures may not be seen on scalp EEG.  Therefore clinical correlation is recommended.  Priyanka [name]   Medications:  sodium chloride Stopped (08/09/21 2310)   levETIRAcetam 1,500 mg (08/10/21 0840)    amLODipine  10 mg Oral Daily   aspirin EC  81 mg Oral Daily   atorvastatin  40 mg Oral QHS   Chlorhexidine Gluconate Cloth  6 each Topical Daily   Chlorhexidine Gluconate Cloth  6 each  Topical Q0600   diltiazem  180 mg Oral Daily   heparin  5,000 Units Subcutaneous Q8H   hydrALAZINE  25 mg Oral Q8H   insulin aspart  0-5 Units Subcutaneous QHS   insulin aspart  0-6 Units Subcutaneous TID WC   insulin aspart  3 Units Subcutaneous TID WC   insulin glargine-yfgn  22 Units Subcutaneous Daily   linagliptin  5 mg Oral Daily   sevelamer carbonate  2,400 mg Oral TID WC   sodium chloride flush  3 mL Intravenous Q12H    Dialysis Orders: [facility] M[facility]  3h 40min   400/1.5   65.5kg  2/2.5 bath  Heparin 2500    - venofer 50 weekly  - mircera 75 ug q2, last 10/21  Assessment/Plan: HTN'sive urgency - Blood pressure significantly improved, continue current medications.  R/o seizures - R leg twitching at home, neurology consulting, EEG ongoing ESRD - on HD MWF. Tolerating well. HD tomorrow per regular schedule.  HTN/ volume - Close to dry wt, no vol excess on exam. BP's down w/ medications. 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 Check phos with HD tomorrow. cont binders, not on vdra     Anice Paganini, PA-C 08/10/2021, 8:44 AM  [facility] Pager: [phone]  Nephrology attending: Patient was seen and examined.  Chart reviewed.  I agree with above. Blood pressure improved.  Tolerated dialysis well.  EEG without seizure.  Next dialysis tomorrow as per regular schedule.  Ok to discharge from renal perspective.  Discussed with the primary team.  Katheran James, MD [facility].

## 2021-08-10 NOTE — Progress Notes (Signed)
Pt receives out-pt HD at [facility] on MWF. Pt arrives at 11:20 for 11:40 chair time. Will assist as needed.   Parisha Beaulac Renal Navigator [phone] 

## 2021-08-10 NOTE — Progress Notes (Addendum)
Subjective: Patient states he only had 2 brief episodes of right lower extremity stiffening since Dilantin load yesterday.  Denies any other concerns.  ROS: negative except above  Examination  Vital signs in last 24 hours: Temp:  [97.6 F (36.4 C)-98.5 F (36.9 C)] 98.5 F (36.9 C) (10/25 1341) Pulse Rate:  [76-88] 88 (10/25 1515) Resp:  [14-20] 20 (10/25 1341) BP: (90-162)/(66-99) 123/66 (10/25 1341) SpO2:  [96 %-100 %] 97 % (10/25 1341) Weight:  [66 kg] 66 kg (10/24 1845)  General: lying in bed, NAD CVS: pulse-normal rate and rhythm RS: breathing comfortably, CTAB Extremities: normal, warm   Neuro: MS: Alert, oriented, follows commands CN: pupils equal and reactive,  EOMI, face symmetric, tongue midline, normal sensation over face, Motor: 5/5 strength in all 4 extremities,   Basic Metabolic Panel: Recent Labs  Lab 08/04/21 1528 08/07/21 1846 08/08/21 0225 08/09/21 0146 08/10/21 0310  NA 131* 134* 134* 134* 134*  K 4.2 4.1 4.0 4.1 4.3  CL 92* 95* 98 98 98  CO2 22 23 21* 22 28  GLUCOSE 582* 373* 369* 243* 195*  BUN 61* 62* 59* 71* 23  CREATININE 9.50* 9.04* 9.78* 10.93* 6.30*  CALCIUM 9.2 9.3 9.1 8.8* 8.8*  MG 2.4  --   --   --   --     CBC: Recent Labs  Lab 08/04/21 1528 08/07/21 1846 08/09/21 0146 08/10/21 0310  WBC 5.2 5.1 4.9 5.5  NEUTROABS 3.6 3.1 2.7 3.4  HGB 11.5* 12.2* 10.6* 11.5*  HCT 33.7* 35.0* 31.7* 34.2*  MCV 91.1 90.7 91.6 92.4  PLT 170 158 161 156     Coagulation Studies: No results for input(s): LABPROT, INR in the last 72 hours.  Imaging No new brain imaging overnight  ASSESSMENT AND PLAN: 75 year old male with bilateral frontal head left temporal encephalomalacia now with right lower extremity twitching and stiffening which is concerning for focal motor seizures.   Focal motor seizures, right lower extremity ( improving) Epilepsy with breakthrough seizures -Patient had improvement in frequency and duration seizures but did have  2 seizures overnight.   -Breakthrough seizure in the setting of hypertensive urgency  Recommendations -Patient pulled off all his electrodes.  We will discontinue LTM EEG. -Continue Keppra 1500 mg twice daily -Start phenytoin 50 mg 3 times daily -If seizures persist, can increase phenytoin to 75 mg 3 times daily -IV Ativan 2 mg clinical seizure-like activity lasting more than 15 minutes.  Please notify neurology if administered -If no further seizures overnight, patient will likely be ready for discharge from neurology standpoint tomorrow -Management of rest of comorbidities per primary team  I have spent a total of  35 minutes with the patient reviewing hospital notes,  test results, labs and examining the patient as well as establishing an assessment and plan that was discussed personally with the patient and Dr Pietro Cassis  > 50% of time was spent in direct patient care.  Zeb Comfort Epilepsy [facility] For questions after 5pm please refer to AMION to reach the Neurologist on call

## 2021-08-11 DIAGNOSIS — Z992 Dependence on renal dialysis: Secondary | ICD-10-CM | POA: Diagnosis not present

## 2021-08-11 DIAGNOSIS — R569 Unspecified convulsions: Secondary | ICD-10-CM | POA: Diagnosis not present

## 2021-08-11 DIAGNOSIS — Z87898 Personal history of other specified conditions: Secondary | ICD-10-CM | POA: Diagnosis not present

## 2021-08-11 DIAGNOSIS — N186 End stage renal disease: Secondary | ICD-10-CM

## 2021-08-11 LAB — CBC WITH DIFFERENTIAL/PLATELET
Abs Immature Granulocytes: 0.01 10*3/uL (ref 0.00–0.07)
Basophils Absolute: 0 10*3/uL (ref 0.0–0.1)
Basophils Relative: 1 %
Eosinophils Absolute: 0.3 10*3/uL (ref 0.0–0.5)
Eosinophils Relative: 5 %
HCT: 33.1 % — ABNORMAL LOW (ref 39.0–52.0)
Hemoglobin: 11.1 g/dL — ABNORMAL LOW (ref 13.0–17.0)
Immature Granulocytes: 0 %
Lymphocytes Relative: 27 %
Lymphs Abs: 1.4 10*3/uL (ref 0.7–4.0)
MCH: 31.1 pg (ref 26.0–34.0)
MCHC: 33.5 g/dL (ref 30.0–36.0)
MCV: 92.7 fL (ref 80.0–100.0)
Monocytes Absolute: 0.6 10*3/uL (ref 0.1–1.0)
Monocytes Relative: 12 %
Neutro Abs: 2.9 10*3/uL (ref 1.7–7.7)
Neutrophils Relative %: 55 %
Platelets: 173 10*3/uL (ref 150–400)
RBC: 3.57 MIL/uL — ABNORMAL LOW (ref 4.22–5.81)
RDW: 14.3 % (ref 11.5–15.5)
WBC: 5.3 10*3/uL (ref 4.0–10.5)
nRBC: 0 % (ref 0.0–0.2)

## 2021-08-11 LAB — BASIC METABOLIC PANEL
Anion gap: 11 (ref 5–15)
BUN: 36 mg/dL — ABNORMAL HIGH (ref 8–23)
CO2: 25 mmol/L (ref 22–32)
Calcium: 8.8 mg/dL — ABNORMAL LOW (ref 8.9–10.3)
Chloride: 96 mmol/L — ABNORMAL LOW (ref 98–111)
Creatinine, Ser: 8.06 mg/dL — ABNORMAL HIGH (ref 0.61–1.24)
GFR, Estimated: 6 mL/min — ABNORMAL LOW (ref >60)
Glucose, Bld: 197 mg/dL — ABNORMAL HIGH (ref 70–99)
Potassium: 4.1 mmol/L (ref 3.5–5.1)
Sodium: 132 mmol/L — ABNORMAL LOW (ref 135–145)

## 2021-08-11 LAB — GLUCOSE, CAPILLARY
Glucose-Capillary: 121 mg/dL — ABNORMAL HIGH (ref 70–99)
Glucose-Capillary: 144 mg/dL — ABNORMAL HIGH (ref 70–99)
Glucose-Capillary: 176 mg/dL — ABNORMAL HIGH (ref 70–99)
Glucose-Capillary: 226 mg/dL — ABNORMAL HIGH (ref 70–99)

## 2021-08-11 MED ORDER — LEVETIRACETAM 750 MG PO TABS
1500.0000 mg | ORAL_TABLET | Freq: Two times a day (BID) | ORAL | Status: DC
  Filled 2021-08-11: qty 2

## 2021-08-11 MED ORDER — LEVETIRACETAM 500 MG PO TABS: 1500.0000 mg | ORAL_TABLET | Freq: Two times a day (BID) | ORAL | Status: DC

## 2021-08-11 MED ORDER — LEVETIRACETAM 500 MG PO TABS
1500.0000 mg | ORAL_TABLET | Freq: Two times a day (BID) | ORAL | Status: DC
  Administered 2021-08-11 – 2021-08-12 (×3): 1500 mg via ORAL
  Filled 2021-08-11 (×4): qty 3

## 2021-08-11 MED ORDER — HEPARIN SODIUM (PORCINE) 1000 UNIT/ML IJ SOLN
INTRAMUSCULAR | Status: AC
  Filled 2021-08-11: qty 1

## 2021-08-11 MED ORDER — PHENYTOIN 50 MG PO CHEW
75.0000 mg | CHEWABLE_TABLET | Freq: Three times a day (TID) | ORAL | Status: DC
  Administered 2021-08-11 – 2021-08-12 (×3): 75 mg via ORAL
  Filled 2021-08-11 (×5): qty 1.5

## 2021-08-11 NOTE — Progress Notes (Signed)
Subjective: States he had one episode of right lower extremity stiffening at around 8 AM this morning.  Denies any side effects on medications.  ROS: negative except above  Examination  Vital signs in last 24 hours: Temp:  [98 F (36.7 C)-98.2 F (36.8 C)] 98 F (36.7 C) (10/26 1343) Pulse Rate:  [78-91] 91 (10/26 1343) Resp:  [14-18] 17 (10/26 1343) BP: (121-157)/(69-98) 156/90 (10/26 1343) SpO2:  [97 %-99 %] 98 % (10/26 1343) Weight:  [64.4 kg-65.9 kg] 64.4 kg (10/26 1135)  General: lying in bed, NAD CVS: pulse-normal rate and rhythm RS: breathing comfortably, CTAB Extremities: normal, warm   Neuro: MS: Alert, oriented, follows commands CN: pupils equal and reactive,  EOMI, face symmetric, tongue midline, normal sensation over face, Motor: 5/5 strength in all 4 extremities     Basic Metabolic Panel: Recent Labs  Lab 08/04/21 1528 08/07/21 1846 08/08/21 0225 08/09/21 0146 08/10/21 0310 08/11/21 0253  NA 131* 134* 134* 134* 134* 132*  K 4.2 4.1 4.0 4.1 4.3 4.1  CL 92* 95* 98 98 98 96*  CO2 22 23 21* 22 28 25   GLUCOSE 582* 373* 369* 243* 195* 197*  BUN 61* 62* 59* 71* 23 36*  CREATININE 9.50* 9.04* 9.78* 10.93* 6.30* 8.06*  CALCIUM 9.2 9.3 9.1 8.8* 8.8* 8.8*  MG 2.4  --   --   --   --   --     CBC: Recent Labs  Lab 08/04/21 1528 08/07/21 1846 08/09/21 0146 08/10/21 0310 08/11/21 0253  WBC 5.2 5.1 4.9 5.5 5.3  NEUTROABS 3.6 3.1 2.7 3.4 2.9  HGB 11.5* 12.2* 10.6* 11.5* 11.1*  HCT 33.7* 35.0* 31.7* 34.2* 33.1*  MCV 91.1 90.7 91.6 92.4 92.7  PLT 170 158 161 156 173     Coagulation Studies: No results for input(s): LABPROT, INR in the last 72 hours.  Imaging No new brain imaging overnight   ASSESSMENT AND PLAN: 75 year old male with bilateral frontal head left temporal encephalomalacia now with right lower extremity twitching and stiffening which is concerning for focal motor seizures.   Focal motor seizures, right lower extremity (  improving) Epilepsy with breakthrough seizures -Patient had improvement in frequency and duration seizures but did have 1 seizures overnight.   -Breakthrough seizure in the setting of hypertensive urgency   Recommendations -Continue Keppra 1500 mg twice daily and 500 mg supplement dose post hemodialysis -Increase phenytoin to 75 mg 3 times daily -If seizures persist, can increase phenytoin to 100 mg 3 times daily -IV Ativan 2 mg clinical seizure-like activity lasting more than 15 minutes.  Please notify neurology if administered -If no further seizures overnight, patient will likely be ready for discharge from neurology standpoint tomorrow -Recommend follow-up with neurology Dr. April Manson at [facility] in 6 to 8 weeks -Management of rest of comorbidities per primary team   I have spent a total of  35 minutes with the patient reviewing hospital notes,  test results, labs and examining the patient as well as establishing an assessment and plan that was discussed personally with the patient and Dr Maryland Pink  > 50% of time was spent in direct patient care.   Zeb Comfort Epilepsy [facility] For questions after 5pm please refer to AMION to reach the Neurologist on call

## 2021-08-11 NOTE — Progress Notes (Addendum)
PROGRESS NOTE  Jonathan Kane  DOB: 17-Mar-1946  PCP: Cipriano Mile, NP PPI:[id number]  DOA: 08/07/2021  LOS: 3 days  Hospital Day: 5   Chief Complaint  Patient presents with   Leg Pain     Brief narrative: Jonathan Kane is a 75 y.o. male with PMH significant for ESRD HD MWF DM2, uncontrolled HTN, HLD and no history of seizure disorder who lives at home alone and has been lately requiring a walker to ambulate. Patient presented to the ED 10/22 with complaint of intermittent episodes of right leg tremors and involuntary uncontrollable movements.  Episodes last about 1 to 2 minutes, symptoms ongoing and progressive for the last 3 to 4 days movement.  Patient was hospitalized.  Seen by neurology.  Started on Keppra followed by Dilantin.  Subjective: Patient mentioned that he had 2 episodes of like shaking yesterday and then 1 episode this morning after he walked back from the bathroom.  Denies any other complaint at this time  Assessment/Plan:  Focal Motor Seizure involving right lower extremity Neurology is following.  Patient was started on Keppra and then subsequently started also on Dilantin.  EEG did not show any epileptiform activity.  Patient had another episode of like shaking this morning.  Dose of Dilantin has been increased.  Discussed with neurology who recommends patient stay back for another day.  Hypertensive urgency/Uncontrolled hypertension -Blood pressure was elevated over 200s in the ED. -Gradually improved to normal with resumption of home meds that include Cardizem 180 mg daily, amlodipine 10 mg daily, hydralazine 25 mg 3 times daily, Blood pressure is reasonably well controlled.  ESRD HD MWF -Nephrology following -Continue Renvela and sodium bicarb.   Being dialyzed as per his usual schedule.  Uncontrolled type 2 diabetes mellitus with hyperglycemia -A1c 10.6 on 10/23.   -Home meds include Tresiba 22 units daily, and Tradjenta 5 mg daily. -Currently  continued on the same dose of insulin with Semglee 22 units, short-acting insulin Premeal 3 units 3 times daily and Tradjenta 5 mg daily.   Dose of glargine was increased with better control of his glucose levels.    Hyperlipidemia -Continue aspirin 81 mg daily, Lipitor 40 mg daily   Mobility: Lives alone, uses a walker.  PT eval obtained.  Home health PT recommended. Code Status:   Code Status: Full Code  Nutritional status: Body mass index is 20.37 kg/m.     Diet:  Diet Order             Diet heart healthy/carb modified Room service appropriate? Yes; Fluid consistency: Thin  Diet effective now                  DVT prophylaxis:  heparin injection 5,000 Units Start: 08/08/21 1400   Antimicrobials: None Fluid: None Consultants: Neurology Family Communication: Discussed with patient  Status is: Inpatient  Remains inpatient appropriate because: Persistent seizure activity       Infusions:   sodium chloride Stopped (08/11/21 0051)   levETIRAcetam Stopped (08/10/21 2214)    Scheduled Meds:  heparin sodium (porcine)       amLODipine  10 mg Oral Daily   aspirin EC  81 mg Oral Daily   atorvastatin  40 mg Oral QHS   Chlorhexidine Gluconate Cloth  6 each Topical Daily   Chlorhexidine Gluconate Cloth  6 each Topical Q0600   Chlorhexidine Gluconate Cloth  6 each Topical Q0600   diltiazem  180 mg Oral Daily   heparin  5,000 Units  Subcutaneous Q8H   hydrALAZINE  25 mg Oral Q8H   insulin aspart  0-5 Units Subcutaneous QHS   insulin aspart  0-6 Units Subcutaneous TID WC   insulin aspart  3 Units Subcutaneous TID WC   insulin glargine-yfgn  28 Units Subcutaneous Daily   levETIRAcetam  1,500 mg Oral BID   linagliptin  5 mg Oral Daily   phenytoin  75 mg Oral TID   sevelamer carbonate  2,400 mg Oral TID WC   sodium chloride flush  3 mL Intravenous Q12H    Antimicrobials: Anti-infectives (From admission, onward)    None       PRN meds: sodium chloride,  acetaminophen **OR** acetaminophen, senna, sevelamer carbonate, sodium chloride flush   Objective: Vitals:   08/11/21 1100 08/11/21 1135  BP: (!) 147/83 (!) 146/85  Pulse: 91 90  Resp:  14  Temp:  98 F (36.7 C)  SpO2:  97%    Intake/Output Summary (Last 24 hours) at 08/11/2021 1207 Last data filed at 08/11/2021 1128 Gross per 24 hour  Intake 506.34 ml  Output 1500 ml  Net -993.66 ml    Filed Weights   08/09/21 1845 08/11/21 0724 08/11/21 1135  Weight: 66 kg 65.9 kg 64.4 kg   Weight change:  Body mass index is 20.37 kg/m.    Physical Exam:  General appearance: Awake alert.  In no distress Resp: Clear to auscultation bilaterally.  Normal effort Cardio: S1-S2 is normal regular.  No S3-S4.  No rubs murmurs or bruit GI: Abdomen is soft.  Nontender nondistended.  Bowel sounds are present normal.  No masses organomegaly Extremities: No edema.  Full range of motion of lower extremities. Neurologic: Alert and oriented x3.  No focal neurological deficits.    Data Review: I have personally reviewed the laboratory data and studies available.  Recent Labs  Lab 08/04/21 1528 08/07/21 1846 08/09/21 0146 08/10/21 0310 08/11/21 0253  WBC 5.2 5.1 4.9 5.5 5.3  NEUTROABS 3.6 3.1 2.7 3.4 2.9  HGB 11.5* 12.2* 10.6* 11.5* 11.1*  HCT 33.7* 35.0* 31.7* 34.2* 33.1*  MCV 91.1 90.7 91.6 92.4 92.7  PLT 170 158 161 156 173    Recent Labs  Lab 08/04/21 1528 08/07/21 1846 08/08/21 0225 08/09/21 0146 08/10/21 0310 08/11/21 0253  NA 131* 134* 134* 134* 134* 132*  K 4.2 4.1 4.0 4.1 4.3 4.1  CL 92* 95* 98 98 98 96*  CO2 22 23 21* 22 28 25   GLUCOSE 582* 373* 369* 243* 195* 197*  BUN 61* 62* 59* 71* 23 36*  CREATININE 9.50* 9.04* 9.78* 10.93* 6.30* 8.06*  CALCIUM 9.2 9.3 9.1 8.8* 8.8* 8.8*  MG 2.4  --   --   --   --   --      F/u labs ordered Unresulted Labs (From admission, onward)     Start     Ordered   08/12/21 0000  Renal function panel  Once,   R       Question:   Specimen collection method  Answer:  Lab=Lab collect   08/11/21 0627   08/12/21 0000  CBC  Once,   R       Question:  Specimen collection method  Answer:  Lab=Lab collect   08/11/21 8588   08/11/21 1044  MRSA Next Gen by PCR, Nasal  Once,   R        08/11/21 1043            Signed, Bonnielee Haff, MD [facility]  08/11/2021   

## 2021-08-11 NOTE — Progress Notes (Addendum)
[facility] Progress Note   Subjective:   Seen on HD. Feeling well this AM. Denies SOB, CP, palpitations, GI upset. Reports he had another episode of RLE spasm this morning.   Objective Vitals:   08/11/21 0900 08/11/21 0930 08/11/21 1000 08/11/21 1100  BP: (!) 154/85 126/80 121/75 (!) 147/83  Pulse: 91 89 90 91  Resp:      Temp:      TempSrc:      SpO2:      Weight:      Height:       Physical Exam General: WDWN male, alert and in NAD Heart: RRR, no murmur Lungs: CTA bilaterally Abdomen: Soft, non-tender, non-distended, +BS Extremities: No edema b/l lower extremities Dialysis Access:  [facility] accessed    Additional Objective Labs: Basic Metabolic Panel: Recent Labs  Lab 08/09/21 0146 08/10/21 0310 08/11/21 0253  NA 134* 134* 132*  K 4.1 4.3 4.1  CL 98 98 96*  CO2 22 28 25   GLUCOSE 243* 195* 197*  BUN 71* 23 36*  CREATININE 10.93* 6.30* 8.06*  CALCIUM 8.8* 8.8* 8.8*   Liver Function Tests: Recent Labs  Lab 08/04/21 1528 08/07/21 1846  AST 18 18  ALT 15 16  ALKPHOS 76 70  BILITOT 0.6 0.5  PROT 8.3* 9.1*  ALBUMIN 3.9 4.6   No results for input(s): LIPASE, AMYLASE in the last 168 hours. CBC: Recent Labs  Lab 08/04/21 1528 08/07/21 1846 08/09/21 0146 08/10/21 0310 08/11/21 0253  WBC 5.2 5.1 4.9 5.5 5.3  NEUTROABS 3.6 3.1 2.7 3.4 2.9  HGB 11.5* 12.2* 10.6* 11.5* 11.1*  HCT 33.7* 35.0* 31.7* 34.2* 33.1*  MCV 91.1 90.7 91.6 92.4 92.7  PLT 170 158 161 156 173   Blood Culture    Component Value Date/Time   SDES BLOOD RIGHT HAND 06/03/2021 0515   SPECREQUEST  06/03/2021 0515    BOTTLES DRAWN AEROBIC AND ANAEROBIC Blood Culture adequate volume   CULT  06/03/2021 0515    NO GROWTH 5 DAYS Performed at [facility], [place] [address], [place],  [address]    REPTSTATUS 06/08/2021 FINAL 06/03/2021 0515    Cardiac Enzymes: No results for input(s): CKTOTAL, CKMB, CKMBINDEX, TROPONINI in the last 168 hours. CBG: Recent Labs   Lab 08/09/21 2145 08/10/21 1403 08/10/21 1725 08/10/21 2103 08/11/21 0615  GLUCAP 224* 165* 133* 80 121*   Iron Studies: No results for input(s): IRON, TIBC, TRANSFERRIN, FERRITIN in the last 72 hours. @lablastinr3 @ Studies/Results: No results found. Medications:  sodium chloride Stopped (08/11/21 0051)   levETIRAcetam Stopped (08/10/21 2214)    amLODipine  10 mg Oral Daily   aspirin EC  81 mg Oral Daily   atorvastatin  40 mg Oral QHS   Chlorhexidine Gluconate Cloth  6 each Topical Daily   Chlorhexidine Gluconate Cloth  6 each Topical Q0600   Chlorhexidine Gluconate Cloth  6 each Topical Q0600   diltiazem  180 mg Oral Daily   heparin  5,000 Units Subcutaneous Q8H   hydrALAZINE  25 mg Oral Q8H   insulin aspart  0-5 Units Subcutaneous QHS   insulin aspart  0-6 Units Subcutaneous TID WC   insulin aspart  3 Units Subcutaneous TID WC   insulin glargine-yfgn  28 Units Subcutaneous Daily   levETIRAcetam  1,500 mg Oral BID   linagliptin  5 mg Oral Daily   phenytoin  75 mg Oral TID   sevelamer carbonate  2,400 mg Oral TID WC   sodium chloride flush  3 mL Intravenous Q12H    Dialysis Orders: [facility] MWF  3h 54min   400/1.5   65.5kg  2/2.5 bath  Heparin 2500    - venofer 50 weekly  - mircera 75 ug q2, last 10/21  Assessment/Plan: HTN'sive urgency - Blood pressure significantly improved, continue current medications.  R/o seizures - R leg twitching at home, neurology consulting, EEG negative, management per neurology ESRD - on HD MWF. Tolerating well.  HTN/ volume - Close to dry wt, no vol excess on exam. BP's down w/ medications. UF 1-1.5 L w/ HD  Anemia ckd - Hb 11.1, no esa needs at present MBD ckd - Calcium controlled. Phos level pending. cont binders, not on vdra  Anice Paganini, PA-C 08/11/2021, 11:20 AM  [facility] Pager: [phone]  Nephrology attending: Patient was seen and examined at dialysis unit.  Chart reviewed.  I agree with  above. Blood pressure is much better and he is tolerating dialysis well.  Neurology is following for seizure and patient reports twitching of his legs this morning.  EEG negative.  Hemoglobin at goal. Is D.  Carolin Sicks, MD [facility].

## 2021-08-12 DIAGNOSIS — Z87898 Personal history of other specified conditions: Secondary | ICD-10-CM | POA: Diagnosis not present

## 2021-08-12 DIAGNOSIS — R569 Unspecified convulsions: Secondary | ICD-10-CM | POA: Diagnosis not present

## 2021-08-12 LAB — GLUCOSE, CAPILLARY
Glucose-Capillary: 188 mg/dL — ABNORMAL HIGH (ref 70–99)
Glucose-Capillary: 190 mg/dL — ABNORMAL HIGH (ref 70–99)

## 2021-08-12 MED ORDER — PHENYTOIN 50 MG PO CHEW: 75.0000 mg | CHEWABLE_TABLET | Freq: Three times a day (TID) | ORAL | 1 refills | Status: AC

## 2021-08-12 MED ORDER — CAMPHOR-MENTHOL 0.5-0.5 % EX LOTN
TOPICAL_LOTION | CUTANEOUS | Status: DC | PRN
  Filled 2021-08-12: qty 222

## 2021-08-12 MED ORDER — LEVETIRACETAM 750 MG PO TABS: 1500.0000 mg | ORAL_TABLET | Freq: Two times a day (BID) | ORAL | 1 refills | Status: AC

## 2021-08-12 MED ORDER — LEVETIRACETAM 500 MG PO TABS: 500.0000 mg | ORAL_TABLET | ORAL | 1 refills | Status: AC

## 2021-08-12 MED ORDER — HYDRALAZINE HCL 25 MG PO TABS: 25.0000 mg | ORAL_TABLET | Freq: Three times a day (TID) | ORAL | 0 refills | Status: AC

## 2021-08-12 NOTE — Progress Notes (Addendum)
Discharge instructions (including medications) discussed with and copy provided to patient/caregiver. Patient verbalized that his walker was left at [facility] and not brought here via EMS. Gilford Rile was not here while he was a patient. Instructed patient to call [facility] and locate walker since that was the last known location. Gave number. Verbalized understanding. Informed Electronics engineer. Patient also stated that he would ask his family but no family was at [place] at the time of admission.

## 2021-08-12 NOTE — Care Management Important Message (Signed)
Important Message  Patient Details  Name: Jonathan Kane MRN: [id number] Date of Birth: [birth year]   Medicare Important Message Given:  Yes     Shelda Altes 08/12/2021, 11:23 AM

## 2021-08-12 NOTE — Progress Notes (Signed)
Subjective:Denies any further seizures overnight.   ROS: negative except above  Examination  Vital signs in last 24 hours: Temp:  [97.4 F (36.3 C)-98.5 F (36.9 C)] 97.4 F (36.3 C) (10/27 1334) Pulse Rate:  [81-90] 90 (10/27 1334) Resp:  [18] 18 (10/27 1334) BP: (127-152)/(79-94) 152/88 (10/27 1314) SpO2:  [97 %-99 %] 99 % (10/27 0529)  General: sitting in chair, NAD CVS: pulse-normal rate and rhythm RS: breathing comfortably, CTAB Extremities: normal, warm   Neuro: MS: Alert, oriented, follows commands CN: pupils equal and reactive,  EOMI, face symmetric, tongue midline, normal sensation over face, Motor: 5/5 strength in all 4 extremities   Basic Metabolic Panel: Recent Labs  Lab 08/07/21 1846 08/08/21 0225 08/09/21 0146 08/10/21 0310 08/11/21 0253  NA 134* 134* 134* 134* 132*  K 4.1 4.0 4.1 4.3 4.1  CL 95* 98 98 98 96*  CO2 23 21* 22 28 25   GLUCOSE 373* 369* 243* 195* 197*  BUN 62* 59* 71* 23 36*  CREATININE 9.04* 9.78* 10.93* 6.30* 8.06*  CALCIUM 9.3 9.1 8.8* 8.8* 8.8*    CBC: Recent Labs  Lab 08/07/21 1846 08/09/21 0146 08/10/21 0310 08/11/21 0253  WBC 5.1 4.9 5.5 5.3  NEUTROABS 3.1 2.7 3.4 2.9  HGB 12.2* 10.6* 11.5* 11.1*  HCT 35.0* 31.7* 34.2* 33.1*  MCV 90.7 91.6 92.4 92.7  PLT 158 161 156 173     Coagulation Studies: No results for input(s): LABPROT, INR in the last 72 hours.  Imaging 75 year old male with bilateral frontal head left temporal encephalomalacia now with right lower extremity twitching and stiffening which is concerning for focal motor seizures.   Focal motor seizures, right lower extremity ( improving) Epilepsy with breakthrough seizures -Patient had improvement in frequency and duration seizures but did have 1 seizures overnight.   -Breakthrough seizure in the setting of hypertensive urgency   Recommendations -Continue Keppra 1500 mg twice daily and 500 mg supplement dose post hemodialysis -Continue phenytoin 75 mg 3  times daily - Discussed importance of medication compliance, side effects and seizure precautions including do not drive for 6 months -Recommend follow-up with neurology in 2-3 months  Seizure precautions: Per [facility] statutes, patients with seizures are not allowed to drive until they have been seizure-free for six months and cleared by a physician    Use caution when using heavy equipment or power tools. Avoid working on ladders or at heights. Take showers instead of baths. Ensure the water temperature is not too high on the home water heater. Do not go swimming alone. Do not lock yourself in a room alone (i.e. bathroom). When caring for infants or small children, sit down when holding, feeding, or changing them to minimize risk of injury to the child in the event you have a seizure. Maintain good sleep hygiene. Avoid alcohol.    If patient has another seizure, call 911 and bring them back to the ED if: A.  The seizure lasts longer than 5 minutes.      B.  The patient doesn't wake shortly after the seizure or has new problems such as difficulty seeing, speaking or moving following the seizure C.  The patient was injured during the seizure D.  The patient has a temperature over 102 F (39C) E.  The patient vomited during the seizure and now is having trouble breathing    During the Seizure   - First, ensure adequate ventilation and place patients on the floor on their left side  Loosen clothing  around the neck and ensure the airway is patent. If the patient is clenching the teeth, do not force the mouth open with any object as this can cause severe damage - Remove all items from the surrounding that can be hazardous. The patient may be oblivious to what's happening and may not even know what he or she is doing. If the patient is confused and wandering, either gently guide him/her away and block access to outside areas - Reassure the individual and be comforting - Call 911. In most  cases, the seizure ends before EMS arrives. However, there are cases when seizures may last over 3 to 5 minutes. Or the individual may have developed breathing difficulties or severe injuries. If a pregnant patient or a person with diabetes develops a seizure, it is prudent to call an ambulance. - Finally, if the patient does not regain full consciousness, then call EMS. Most patients will remain confused for about 45 to 90 minutes after a seizure, so you must use judgment in calling for help. - Avoid restraints but make sure the patient is in a bed with padded side rails - Place the individual in a lateral position with the neck slightly flexed; this will help the saliva drain from the mouth and prevent the tongue from falling backward - Remove all nearby furniture and other hazards from the area - Provide verbal assurance as the individual is regaining consciousness - Provide the patient with privacy if possible - Call for help and start treatment as ordered by the caregiver    After the Seizure (Postictal Stage)   After a seizure, most patients experience confusion, fatigue, muscle pain and/or a headache. Thus, one should permit the individual to sleep. For the next few days, reassurance is essential. Being calm and helping reorient the person is also of importance.   Most seizures are painless and end spontaneously. Seizures are not harmful to others but can lead to complications such as stress on the lungs, brain and the heart. Individuals with prior lung problems may develop labored breathing and respiratory distress.   I have spent a total of  35 minutes with the patient reviewing hospital notes,  test results, labs and examining the patient as well as establishing an assessment and plan that was discussed personally with the patient and Dr Maryland Pink  > 50% of time was spent in direct patient care.   Zeb Comfort Epilepsy [facility] For questions after 5pm please refer to  AMION to reach the Neurologist on call

## 2021-08-12 NOTE — Progress Notes (Signed)
Physical Therapy Treatment Patient Details Name: Jonathan Kane MRN: [id number] DOB: 29-Aug-1946 Today's Date: 08/12/2021   History of Present Illness Pt adm 10/22 with intermittent RLE tremors and involuntary movements. 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 Neuro consult with focal seizures.  PMH - ESRD on HD MWF, DMII,, Hep C, seizure and HTN.    PT Comments    The pt continues to present with good progress in OOB stability and gait at this time. He was able to complete hallway ambulation without need for UE support or physical assist without need for LOB or seated rest. He continues to have intermittent difficulty with RLE, but was able to maintain stability without assist. He also complete stairs without assist with use of single UE support. He will continue to benefit from skilled PT for higher level balance challenges, but is safe to return home when medically stable. All education regarding mobility and falls prevention at home completed, pt with no further questions.    Recommendations for follow up therapy are one component of a multi-disciplinary discharge planning process, led by the attending physician.  Recommendations may be updated based on patient status, additional functional criteria and insurance authorization.  Follow Up Recommendations  Home health PT     Assistance Recommended at Discharge PRN  Equipment Recommendations  None recommended by PT    Recommendations for Other Services       Precautions / Restrictions Precautions Precautions: Fall Precaution Comments: focal seizures involving RLE Restrictions Weight Bearing Restrictions: No     Mobility  Bed Mobility Overal bed mobility: Independent                  Transfers Overall transfer level: Needs assistance Equipment used: None Transfers: Sit to/from Stand Sit to Stand: Supervision           General transfer comment: S for safety     Ambulation/Gait Ambulation/Gait assistance: Min guard Gait Distance (Feet): 470 Feet Assistive device: None Gait Pattern/deviations: Step-through pattern;Decreased stride length Gait velocity: 0.57 m/s Gait velocity interpretation: 1.31 - 2.62 ft/sec, indicative of limited community ambulator General Gait Details: pt able to complete without use of RW or UE support, only a few short instances of rapid steps with RLE or incoordinated stepping. pt cued for slowed movements and stopping to rest as these moments pass. no overt LOB at this time.   Stairs Stairs: Yes Stairs assistance: Supervision Stair Management: One rail Right;Alternating pattern;Forwards Number of Stairs: 4 General stair comments: stable, no LOB   Wheelchair Mobility    Modified Rankin (Stroke Patients Only)       Balance Overall balance assessment: Needs assistance Sitting-balance support: No upper extremity supported;Feet supported Sitting balance-Leahy Scale: Good     Standing balance support: No upper extremity supported;During functional activity Standing balance-Leahy Scale: Fair Standing balance comment: no UE support for gait                 Standardized Balance Assessment Standardized Balance Assessment : Dynamic Gait Index   Dynamic Gait Index Level Surface: Normal Change in Gait Speed: Normal Gait with Horizontal Head Turns: Mild Impairment Gait with Vertical Head Turns: Normal Gait and Pivot Turn: Mild Impairment Step Over Obstacle: Mild Impairment Step Around Obstacles: Normal Steps: Mild Impairment Total Score: 20      Cognition Arousal/Alertness: Awake/alert Behavior During Therapy: WFL for tasks assessed/performed Overall Cognitive Status: Within Functional Limits for tasks assessed  General Comments: pt able to follow all instructions appropriately        Exercises      General Comments General comments (skin  integrity, edema, etc.): VSS on RA      Pertinent Vitals/Pain Pain Assessment: No/denies pain Pain Intervention(s): Monitored during session     PT Goals (current goals can now be found in the care plan section) Acute Rehab PT Goals Patient Stated Goal: go home PT Goal Formulation: With patient Time For Goal Achievement: 08/22/21 Potential to Achieve Goals: Good Progress towards PT goals: Progressing toward goals    Frequency    Min 3X/week      PT Plan Current plan remains appropriate       AM-PAC PT "6 Clicks" Mobility   Outcome Measure  Help needed turning from your back to your side while in a flat bed without using bedrails?: None Help needed moving from lying on your back to sitting on the side of a flat bed without using bedrails?: None Help needed moving to and from a bed to a chair (including a wheelchair)?: A Little Help needed standing up from a chair using your arms (e.g., wheelchair or bedside chair)?: None Help needed to walk in hospital room?: A Little Help needed climbing 3-5 steps with a railing? : A Little 6 Click Score: 21    End of Session Equipment Utilized During Treatment: Gait belt Activity Tolerance: Patient tolerated treatment well Patient left: in chair;with call bell/phone within reach;with family/visitor present Nurse Communication: Mobility status PT Visit Diagnosis: Other abnormalities of gait and mobility (R26.89);Other symptoms and signs involving the nervous system (R29.898)     Time: 5956-3875 PT Time Calculation (min) (ACUTE ONLY): 24 min  Charges:  $Gait Training: 23-37 mins                     Jonathan Kane, PT, DPT   Acute Rehabilitation Department Pager #: [phone]   Sandra Cockayne 08/12/2021, 11:18 AM

## 2021-08-12 NOTE — Progress Notes (Signed)
D/C order noted. Contacted [facility] to make them aware pt will resume care tomorrow.   Melven Sartorius Renal Navigator [phone]

## 2021-08-12 NOTE — Discharge Summary (Addendum)
[facility]  Physician Discharge Summary   Patient ID: Jonathan Kane MRN: [id number] DOB/AGE: 02/23/1946 74 y.o.  Admit date: 08/07/2021 Discharge date: 08/12/2021    PCP: Cipriano Mile, NP  DISCHARGE DIAGNOSES:  Complex partial seizure Essential hypertension End-stage renal disease on hemodialysis Diabetes mellitus type 2, uncontrolled with hyperglycemia Hyperlipidemia   RECOMMENDATIONS FOR OUTPATIENT FOLLOW UP: Patient to resume dialysis schedule as outpatient Ambulatory referral sent to neurology for outpatient follow-up   Home Health: Home health PT Equipment/Devices: None  CODE STATUS: Full code  DISCHARGE CONDITION: fair  Diet recommendation: As before  INITIAL HISTORY: Jonathan Kane is a 75 y.o. male with PMH significant for ESRD HD MWF DM2, uncontrolled HTN, HLD and no history of seizure disorder who lives at home alone and has been lately requiring a walker to ambulate. Patient presented to the ED 10/22 with complaint of intermittent episodes of right leg tremors and involuntary uncontrollable movements.  Episodes last about 1 to 2 minutes, symptoms ongoing and progressive for the last 3 to 4 days movement.  Patient was hospitalized.  Seen by neurology.  Started on Keppra followed by Dilantin.  Consultations: Nephrology Neurology  Procedures: EEG which did not show any epileptiform activity  HOSPITAL COURSE:   Focal Motor Seizure involving right lower extremity MRI raised concern for acute versus subacute stroke however patient was not thought to have acute stroke per Stroke service. Patient was seen by neurology and started on Keppra.  EEG did not show any epileptiform activity.  However patient continued to have these jerking movements in the right lower extremity.  Dilantin was subsequently added to Keppra.  Dose of Dilantin had to be titrated upwards.  Symptoms have significantly improved.  Patient wants to go home today.  Discussed with  neurology.  Okay for discharge today on Keppra as well as Dilantin.  Told not to drive or operate machinery   Essential hypertension Initially elevated. Subsequently improved with resumption of home medications.   ESRD HD MWF Followed by nephrology in the hospital and underwent dialysis as per his usual schedule.  Uncontrolled type 2 diabetes mellitus with hyperglycemia HbA1c 10.6.  May resume his home medication regimen.  Please see medication list below for specific details.   Hyperlipidemia Continue home medications.  Patient is stable.  Okay for discharge home today.  PERTINENT LABS:  The results of significant diagnostics from this hospitalization (including imaging, microbiology, ancillary and laboratory) are listed below for reference.    Microbiology: Recent Results (from the past 240 hour(s))  Resp Panel by RT-PCR (Flu A&B, Covid) Nasopharyngeal Swab     Status: None   Collection Time: 08/07/21  9:10 PM   Specimen: Nasopharyngeal Swab; Nasopharyngeal(NP) swabs in vial transport medium  Result Value Ref Range Status   SARS Coronavirus 2 by RT PCR NEGATIVE NEGATIVE Final    Comment: (NOTE) SARS-CoV-2 target nucleic acids are NOT DETECTED.  The SARS-CoV-2 RNA is generally detectable in upper respiratory specimens during the acute phase of infection. The lowest concentration of SARS-CoV-2 viral copies this assay can detect is 138 copies/mL. A negative result does not preclude SARS-Cov-2 infection and should not be used as the sole basis for treatment or other patient management decisions. A negative result may occur with  improper specimen collection/handling, submission of specimen other than nasopharyngeal swab, presence of viral mutation(s) within the areas targeted by this assay, and inadequate number of viral copies(<138 copies/mL). A negative result must be combined with clinical observations, patient history, and epidemiological  information. The expected result is  Negative.  Fact Sheet for Patients:  EntrepreneurPulse.com.au  Fact Sheet for Healthcare Providers:  IncredibleEmployment.be  This test is no t yet approved or cleared by the [place] FDA and  has been authorized for detection and/or diagnosis of SARS-CoV-2 by FDA under an Emergency Use Authorization (EUA). This EUA will remain  in effect (meaning this test can be used) for the duration of the COVID-19 declaration under Section 564(b)(1) of the Act, 21 U.S.C.section 360bbb-3(b)(1), unless the authorization is terminated  or revoked sooner.       Influenza A by PCR NEGATIVE NEGATIVE Final   Influenza B by PCR NEGATIVE NEGATIVE Final    Comment: (NOTE) The Xpert Xpress SARS-CoV-2/FLU/RSV plus assay is intended as an aid in the diagnosis of influenza from Nasopharyngeal swab specimens and should not be used as a sole basis for treatment. Nasal washings and aspirates are unacceptable for Xpert Xpress SARS-CoV-2/FLU/RSV testing.  Fact Sheet for Patients: EntrepreneurPulse.com.au  Fact Sheet for Healthcare Providers: IncredibleEmployment.be  This test is not yet approved or cleared by the [place] FDA and has been authorized for detection and/or diagnosis of SARS-CoV-2 by FDA under an Emergency Use Authorization (EUA). This EUA will remain in effect (meaning this test can be used) for the duration of the COVID-19 declaration under Section 564(b)(1) of the Act, 21 U.S.C. section 360bbb-3(b)(1), unless the authorization is terminated or revoked.  Performed at [facility], [address] [place], [place] [address]      Labs:  COVID-19 Labs   Lab Results  Component Value Date   SARSCOV2NAA NEGATIVE 08/07/2021   [place] NEGATIVE 06/08/2021   [place] NEGATIVE 05/28/2021   [place] NEGATIVE 11/07/2019      Basic Metabolic Panel: Recent Labs  Lab 08/07/21 1846  08/08/21 0225 08/09/21 0146 08/10/21 0310 08/11/21 0253  NA 134* 134* 134* 134* 132*  K 4.1 4.0 4.1 4.3 4.1  CL 95* 98 98 98 96*  CO2 23 21* 22 28 25   GLUCOSE 373* 369* 243* 195* 197*  BUN 62* 59* 71* 23 36*  CREATININE 9.04* 9.78* 10.93* 6.30* 8.06*  CALCIUM 9.3 9.1 8.8* 8.8* 8.8*   Liver Function Tests: Recent Labs  Lab 08/07/21 1846  AST 18  ALT 16  ALKPHOS 70  BILITOT 0.5  PROT 9.1*  ALBUMIN 4.6    CBC: Recent Labs  Lab 08/07/21 1846 08/09/21 0146 08/10/21 0310 08/11/21 0253  WBC 5.1 4.9 5.5 5.3  NEUTROABS 3.1 2.7 3.4 2.9  HGB 12.2* 10.6* 11.5* 11.1*  HCT 35.0* 31.7* 34.2* 33.1*  MCV 90.7 91.6 92.4 92.7  PLT 158 161 156 173     CBG: Recent Labs  Lab 08/11/21 1154 08/11/21 1609 08/11/21 2046 08/12/21 0730 08/12/21 1135  GLUCAP 144* 176* 226* 188* 190*     IMAGING STUDIES CT Head Wo Contrast  Result Date: 08/07/2021 CLINICAL DATA:  Right leg twitching for 3-4 days. EXAM: CT HEAD WITHOUT CONTRAST TECHNIQUE: Contiguous axial images were obtained from the base of the skull through the vertex without intravenous contrast. COMPARISON:  CT head 05/28/2021 FINDINGS: Brain: No evidence of acute infarction, hemorrhage, hydrocephalus, extra-axial collection or mass lesion/mass effect. Unchanged encephalomalacia in the frontal lobes and left temporal lobe. Hypodensities in the periventricular and subcortical white matter, consistent with chronic small vessel ischemic changes. Generalized parenchymal volume loss with associated enlargement of the lateral ventricles. Vascular: No hyperdense vessel or unexpected calcification. Skull: Normal. Negative for fracture or focal lesion. Sinuses/Orbits:  No acute finding. Other: None. IMPRESSION: No acute intracranial abnormality. 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 Electronically Signed   By: Ileana Roup M.D.   On: 08/07/2021 19:24   MR BRAIN WO CONTRAST  Result Date: 08/08/2021 CLINICAL DATA:  75 year old  male with neurologic deficit. EXAM: MRI HEAD WITHOUT CONTRAST TECHNIQUE: Multiplanar, multiecho pulse sequences of the brain and surrounding structures were obtained without intravenous contrast. COMPARISON:  Head CT 08/07/2021.  Brain MRI 06/02/2021 and earlier. FINDINGS: Brain: Fairly extensive chronic bilateral anterior frontal lobe and left temporal lobe encephalomalacia redemonstrated. Mild ex vacuo ventricular enlargement. Superimposed chronic confluent bilateral white matter T2 and FLAIR hyperintensity. There is a small area of possible mildly restricted white matter diffusion in the right frontal lobe on series 5, image 97, versus T2 shine through. No other restricted diffusion. No other new signal abnormality. No midline shift, mass effect, evidence of mass lesion, ventriculomegaly, extra-axial collection or acute intracranial hemorrhage. Cervicomedullary junction and pituitary are within normal limits. Little to no chronic cerebral blood products. Deep gray matter nuclei, brainstem and cerebellum remain within normal limits for age. Vascular: Major intracranial vascular flow voids are stable. Skull and upper cervical spine: Stable. Visualized bone marrow signal is within normal limits. Sinuses/Orbits: Stable, negative. Other: Visible internal auditory structures appear normal. Mastoids remain clear. Stable visible scalp and face. IMPRESSION: 1. Questionable small acute to subacute white matter infarct in the right frontal lobe, versus T2-shine-through in the setting of advanced underlying chronic white matter disease. No associated hemorrhage or mass effect. 2. Otherwise continued stable MRI appearance of the brain. 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 Electronically Signed   By: Genevie Ann M.D.   On: 08/08/2021 06:54   DG Chest Portable 1 View  Result Date: 08/07/2021 CLINICAL DATA:  Right leg twitching. EXAM: PORTABLE CHEST 1 VIEW COMPARISON:  June 01, 2021 FINDINGS:  There is stable right-sided venous catheter positioning. Multiple overlying radiopaque cardiac lead wires are seen. The heart size and mediastinal contours are within normal limits. Both lungs are clear. The visualized skeletal structures are unremarkable. IMPRESSION: No active disease. Electronically Signed   By: Virgina Norfolk M.D.   On: 08/07/2021 19:23   EEG adult  Result Date: 08/08/2021 Lora Havens, MD     08/08/2021  8:59 PM Patient Name: Jonathan Kane MRN: [id number] Epilepsy Attending: Lora Havens Referring Physician/Provider: Dr Terrilee Croak Date: 08/08/2021 Duration: 23.04 mins Patient history: 75 y.o. male with PMHx DM2 (nonadherent), ESRD on HD M/W/F, HTN (uncontrolled) with seizure-like activity in the right lower extremity without loss of awareness. EEG to evaluate for seizure. Level of alertness: Awake AEDs during EEG study:  LEV Technical aspects: This EEG study was done with scalp electrodes positioned according to the 10-20 International system of electrode placement. Electrical activity was acquired at a sampling rate of 500Hz  and reviewed with a high frequency filter of 70Hz  and a low frequency filter of 1Hz . EEG data were recorded continuously and digitally stored. Description: The posterior dominant rhythm consists of 9 Hz activity of moderate voltage (25-35 uV) seen predominantly in posterior head regions, symmetric and reactive to eye opening and eye closing.  Hyperventilation and photic stimulation were not performed.   IMPRESSION: This study is within normal limits. No seizures or epileptiform discharges were seen throughout the recording. Priyanka [name]   Overnight EEG with video  Result Date: 08/09/2021 Lora Havens, MD     08/10/2021  6:31 AM Patient Name: Melissa  ROBET CRUTCHFIELD MRN: [id number] Epilepsy Attending: Lora Havens Referring Physician/Provider: Beulah Gandy, NP Duration: 08/08/2021 1809 to 08/09/2021 1330, 08/09/2021 2041- 08/10/2021 0400   Patient history: 75 y.o. male with PMHx DM2 (nonadherent), ESRD on HD M/W/F, HTN (uncontrolled) with seizure-like activity in the right lower extremity without loss of awareness. EEG to evaluate for seizure.  Level of alertness: Awake, asleep  AEDs during EEG study:  LEV  Technical aspects: This EEG study was done with scalp electrodes positioned according to the 10-20 International system of electrode placement. Electrical activity was acquired at a sampling rate of 500Hz  and reviewed with a high frequency filter of 70Hz  and a low frequency filter of 1Hz . EEG data were recorded continuously and digitally stored.  Description: The posterior dominant rhythm consists of 9 Hz activity of moderate voltage (25-35 uV) seen predominantly in posterior head regions, symmetric and reactive to eye opening and eye closing. Sleep was characterized by vertex waves, sleep spindles (12 to 14 Hz), maximal frontocentral region.  Hyperventilation and photic stimulation were not performed. Event button was pressed on 08/08/2021 at 2245 and on 08/09/2021 at 0610, 0844 and 1023 for right leg twitching.  Concomitant EEG before, during and after the event did not show any EEG changes suggest seizure.  Clinical correlation recommended not immediately to suggest needed. Patient went for hemodialysis and therefore study was unhooked from 1330 to 2041.  IMPRESSION: This study is within normal limits. No seizures or epileptiform discharges were seen throughout the recording. Four events were recorded as described above during which patient had right leg twitching without concomitant EEG change. However, focal motor seizures may not be seen on scalp EEG.  Therefore clinical correlation is recommended.  [place]   DG HIP UNILAT WITH PELVIS 2-3 VIEWS RIGHT  Result Date: 07/26/2021 CLINICAL DATA:  Right leg weakness EXAM: DG HIP (WITH OR WITHOUT PELVIS) 2-3V RIGHT COMPARISON:  None. FINDINGS: Normal alignment. No fracture or  dislocation. Hip joint spaces are preserved. Vascular calcifications are noted within the pelvis and medial thighs bilaterally. Surgical clips are seen overlying the scrotum and within the expected location of the prostate gland IMPRESSION: No acute fracture or dislocation. Electronically Signed   By: Fidela Salisbury M.D.   On: 07/26/2021 02:54   DG Knee 3 Views Right  Result Date: 07/26/2021 CLINICAL DATA:  Remote fall, right leg weakness EXAM: RIGHT KNEE - 3 VIEW COMPARISON:  None. FINDINGS: Normal alignment. No acute fracture or dislocation. No effusion. Mild degenerative the soft the involving the insertion of the quadriceps and patellar tendons upon the patella. Soft tissues are otherwise unremarkable. IMPRESSION: No acute fracture or dislocation. Electronically Signed   By: Fidela Salisbury M.D.   On: 07/26/2021 02:55    DISCHARGE EXAMINATION: Vitals:   08/12/21 0836 08/12/21 1036 08/12/21 1314 08/12/21 1334  BP: (!) 151/88 (!) 147/82 (!) 152/88   Pulse: 85   90  Resp:    18  Temp:    (!) 97.4 F (36.3 C)  TempSrc: Oral   Oral  SpO2:      Weight:      Height:       General appearance: Awake alert.  In no distress Resp: Clear to auscultation bilaterally.  Normal effort Cardio: S1-S2 is normal regular.  No S3-S4.  No rubs murmurs or bruit GI: Abdomen is soft.  Nontender nondistended.  Bowel sounds are present normal.  No masses organomegaly     DISPOSITION: Home  Discharge Instructions  Ambulatory referral to Neurology   Complete by: As directed    An appointment is requested in approximately: 6 weeks for focal seizures   Call MD for:  difficulty breathing, headache or visual disturbances   Complete by: As directed    Call MD for:  extreme fatigue   Complete by: As directed    Call MD for:  persistant dizziness or light-headedness   Complete by: As directed    Call MD for:  persistant nausea and vomiting   Complete by: As directed    Call MD for:  severe uncontrolled  pain   Complete by: As directed    Call MD for:  temperature >100.4   Complete by: As directed    Diet Carb Modified   Complete by: As directed    Discharge instructions   Complete by: As directed    Please take your medications as prescribed and instructed.  Referral has been sent to the neurology office for follow-up.  Seek attention if you have recurrent episodes of seizure activity as discussed with you earlier today.  You were cared for by a hospitalist during your hospital stay. If you have any questions about your discharge medications or the care you received while you were in the hospital after you are discharged, you can call the unit and asked to speak with the hospitalist on call if the hospitalist that took care of you is not available. Once you are discharged, your primary care physician will handle any further medical issues. Please note that NO REFILLS for any discharge medications will be authorized once you are discharged, as it is imperative that you return to your primary care physician (or establish a relationship with a primary care physician if you do not have one) for your aftercare needs so that they can reassess your need for medications and monitor your lab values. If you do not have a primary care physician, you can call [phone] for a physician referral.   Increase activity slowly   Complete by: As directed           Allergies as of 08/12/2021       Reactions   Dulaglutide    TRULICITY:  GI Upset intolerance        Medication List     STOP taking these medications    amLODipine 10 MG tablet Commonly known as: NORVASC   methocarbamol 500 MG tablet Commonly known as: ROBAXIN       TAKE these medications    acetaminophen 325 MG tablet Commonly known as: TYLENOL Take 2 tablets (650 mg total) by mouth every 6 (six) hours as needed for fever, mild pain or headache.   aspirin EC 81 MG tablet Take 81 mg by mouth daily. Notes to patient: Start on  08/13/21   atorvastatin 40 MG tablet Commonly known as: LIPITOR Take 40 mg by mouth at bedtime. Notes to patient: Start on 08/12/21   cholecalciferol 25 MCG (1000 UNIT) tablet Commonly known as: VITAMIN D3 Take 1,000 Units by mouth daily. Notes to patient: Start on 08/13/21   diltiazem 180 MG 24 hr capsule Commonly known as: TIAZAC Take 180 mg by mouth daily. Notes to patient: Start on 08/13/21   glipiZIDE 10 MG tablet Commonly known as: GLUCOTROL Take 10 mg by mouth daily. Notes to patient: Start on 08/13/21   hydrALAZINE 25 MG tablet Commonly known as: APRESOLINE Take 1 tablet (25 mg total) by mouth every 8 (eight) hours. Notes to patient: Start in  the evening at 10pm on 08/12/21   latanoprost 0.005 % ophthalmic solution Commonly known as: XALATAN Place 1 drop into both eyes at bedtime. Notes to patient: Start on 08/12/21   levETIRAcetam 750 MG tablet Commonly known as: KEPPRA Take 2 tablets (1,500 mg total) by mouth 2 (two) times daily.   levETIRAcetam 500 MG tablet Commonly known as: Keppra Take 1 tablet (500 mg total) by mouth every Monday, Wednesday, and Friday at 6 PM. Take after hemodialysis on dialysis days. This is in addition to the other prescription. Notes to patient: Start on 08/13/21   lidocaine-prilocaine cream Commonly known as: EMLA Apply 1 application topically 3 (three) times a week. Notes to patient: Start on 08/13/21   phenytoin 50 MG tablet Commonly known as: DILANTIN Chew 1.5 tablets (75 mg total) by mouth 3 (three) times daily. Notes to patient: Start in the evening on 08/12/21   sevelamer carbonate 800 MG tablet Commonly known as: RENVELA Take 800-2,400 mg by mouth See admin instructions. Take 3 tablets (2400 mg) by mouth with meals and 1 tablet (800 mg) with snacks Notes to patient: Start in the evening on 08/12/21   sildenafil 20 MG tablet Commonly known as: REVATIO Take 40-60 mg by mouth daily as needed (ED).   sodium  bicarbonate 650 MG tablet Take 1 tablet (650 mg total) by mouth 2 (two) times daily. Notes to patient: Start on 08/13/21   Tradjenta 5 MG Tabs tablet Generic drug: linagliptin Take 5 mg by mouth daily. Notes to patient: Start on 08/13/21   Tyler Aas FlexTouch 100 UNIT/ML FlexTouch Pen Generic drug: insulin degludec Inject 22 Units into the skin daily. Notes to patient: Start on 08/13/21          Follow-up [place].   Specialty: Home Health Services Why: Physical Therapy-office to call with a visit time Contact information: [place] [address] [phone]         Alric Ran, MD Follow up.   Specialty: Neurology Why: office will call to schedule appointment Contact information: [place] [address] [phone]                 TOTAL DISCHARGE TIME: 5 minutes  [place]  [facility] Pager on [email or web address]  08/13/2021, 1:19 PM

## 2021-08-12 NOTE — Progress Notes (Addendum)
[facility] Progress Note   Subjective:   Pt seen in room, standing at the sink. Feeling well, no SOB, CP, palpitations, or dizziness.   Objective Vitals:   08/12/21 0529 08/12/21 0825 08/12/21 0836 08/12/21 1036  BP: (!) 143/94  (!) 151/88 (!) 147/82  Pulse: 84  85   Resp: 18     Temp: 98.5 F (36.9 C) (!) 97.4 F (36.3 C)    TempSrc: Oral Oral Oral   SpO2: 99%     Weight:      Height:       Physical Exam General: WDWN male, alert and in NAD Heart: RRR, no murmur Lungs: CTA bilaterally Abdomen: Soft, non-tender, non-distended, +BS Extremities: No edema b/l lower extremities Dialysis Access:  [facility] with dry, intact bandage  Additional Objective Labs: Basic Metabolic Panel: Recent Labs  Lab 08/09/21 0146 08/10/21 0310 08/11/21 0253  NA 134* 134* 132*  K 4.1 4.3 4.1  CL 98 98 96*  CO2 22 28 25   GLUCOSE 243* 195* 197*  BUN 71* 23 36*  CREATININE 10.93* 6.30* 8.06*  CALCIUM 8.8* 8.8* 8.8*   Liver Function Tests: Recent Labs  Lab 08/07/21 1846  AST 18  ALT 16  ALKPHOS 70  BILITOT 0.5  PROT 9.1*  ALBUMIN 4.6   No results for input(s): LIPASE, AMYLASE in the last 168 hours. CBC: Recent Labs  Lab 08/07/21 1846 08/09/21 0146 08/10/21 0310 08/11/21 0253  WBC 5.1 4.9 5.5 5.3  NEUTROABS 3.1 2.7 3.4 2.9  HGB 12.2* 10.6* 11.5* 11.1*  HCT 35.0* 31.7* 34.2* 33.1*  MCV 90.7 91.6 92.4 92.7  PLT 158 161 156 173   Blood Culture    Component Value Date/Time   SDES BLOOD RIGHT HAND 06/03/2021 0515   SPECREQUEST  06/03/2021 0515    BOTTLES DRAWN AEROBIC AND ANAEROBIC Blood Culture adequate volume   CULT  06/03/2021 0515    NO GROWTH 5 DAYS Performed at [facility], [place] [address] [place], [place] [address]    REPTSTATUS 06/08/2021 FINAL 06/03/2021 0515    Cardiac Enzymes: No results for input(s): CKTOTAL, CKMB, CKMBINDEX, TROPONINI in the last 168 hours. CBG: Recent Labs  Lab 08/11/21 1154 08/11/21 1609 08/11/21 2046  08/12/21 0730 08/12/21 1135  GLUCAP 144* 176* 226* 188* 190*   Iron Studies: No results for input(s): IRON, TIBC, TRANSFERRIN, FERRITIN in the last 72 hours. @lablastinr3 @ Studies/Results: No results found. Medications:  sodium chloride Stopped (08/11/21 0051)    amLODipine  10 mg Oral Daily   aspirin EC  81 mg Oral Daily   atorvastatin  40 mg Oral QHS   Chlorhexidine Gluconate Cloth  6 each Topical Daily   Chlorhexidine Gluconate Cloth  6 each Topical Q0600   Chlorhexidine Gluconate Cloth  6 each Topical Q0600   diltiazem  180 mg Oral Daily   heparin  5,000 Units Subcutaneous Q8H   hydrALAZINE  25 mg Oral Q8H   insulin aspart  0-5 Units Subcutaneous QHS   insulin aspart  0-6 Units Subcutaneous TID WC   insulin aspart  3 Units Subcutaneous TID WC   insulin glargine-yfgn  28 Units Subcutaneous Daily   levETIRAcetam  1,500 mg Oral BID   linagliptin  5 mg Oral Daily   phenytoin  75 mg Oral TID   sevelamer carbonate  2,400 mg Oral TID WC   sodium chloride flush  3 mL Intravenous Q12H    Dialysis Orders: [facility] MWF  3h 59min   400/1.5  65.5kg  2/2.5 bath  Heparin 2500    - venofer 50 weekly  - mircera 75 ug q2, last 10/21  Assessment/Plan: HTN'sive urgency - Blood pressure significantly improved, continue current medications.  R/o seizures - R leg twitching at home, neurology consulting, EEG negative, management per neurology ESRD - on HD MWF. Tolerating well. Next HD tomorrow, outpatient if he is discharging today HTN/ volume - now slightly under EDW, no vol excess on exam. BP's down w/ medications. Will lower EDW slightly at discharge.  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 Phos level pending. cont binders, not on vdra  Anice Paganini, PA-C 08/12/2021, 11:42 AM  [facility] Pager: [phone]  I have seen and examined this patient and agree with plan and assessment in the above note with renal  recommendations/intervention highlighted. Feeling better and asking when he can go home.  Continue with bp meds.  Will need lower edw after discharge.  Governor [place] Miki Labuda,MD 08/12/2021 12:47 PM

## 2021-08-13 ENCOUNTER — Telehealth: Payer: Self-pay | Admitting: Physician Assistant

## 2021-08-13 NOTE — Telephone Encounter (Signed)
Transition of care contact from inpatient facility  Date of Discharge: 08/12/21 Date of Contact: 08/07/21 Method of contact: Phone  Attempted to contact patient to discuss transition of care from inpatient admission. Patient did not answer the phone. Unable to leave VM, mailbox was full.  Anice Paganini, PA-C 08/13/2021, 1:19 PM  [employer]

## 2021-08-17 ENCOUNTER — Other Ambulatory Visit: Payer: Self-pay

## 2021-08-17 DIAGNOSIS — E1122 Type 2 diabetes mellitus with diabetic chronic kidney disease: Secondary | ICD-10-CM

## 2021-08-24 ENCOUNTER — Ambulatory Visit (INDEPENDENT_AMBULATORY_CARE_PROVIDER_SITE_OTHER)
Admission: RE | Admit: 2021-08-24 | Discharge: 2021-08-24 | Disposition: A | Discharge status: Home / Self Care | Payer: Medicare Other | Source: Ambulatory Visit | Attending: Vascular Surgery | Admitting: Vascular Surgery

## 2021-08-24 ENCOUNTER — Ambulatory Visit (INDEPENDENT_AMBULATORY_CARE_PROVIDER_SITE_OTHER): Payer: Medicare Other | Admitting: Vascular Surgery

## 2021-08-24 ENCOUNTER — Encounter (HOSPITAL_COMMUNITY): Payer: Self-pay | Admitting: Vascular Surgery

## 2021-08-24 ENCOUNTER — Encounter: Payer: Self-pay | Admitting: *Deleted

## 2021-08-24 ENCOUNTER — Other Ambulatory Visit: Payer: Self-pay | Admitting: *Deleted

## 2021-08-24 ENCOUNTER — Other Ambulatory Visit: Payer: Self-pay

## 2021-08-24 ENCOUNTER — Ambulatory Visit (HOSPITAL_COMMUNITY)
Admission: RE | Admit: 2021-08-24 | Discharge: 2021-08-24 | Disposition: A | Discharge status: Home / Self Care | Payer: Medicare Other | Source: Ambulatory Visit | Attending: Vascular Surgery | Admitting: Vascular Surgery

## 2021-08-24 ENCOUNTER — Encounter: Payer: Self-pay | Admitting: Vascular Surgery

## 2021-08-24 VITALS — BP 162/108 | HR 82 | Temp 97.7°F | Resp 18 | Ht 70.0 in | Wt 144.0 lb

## 2021-08-24 DIAGNOSIS — Z992 Dependence on renal dialysis: Secondary | ICD-10-CM | POA: Diagnosis not present

## 2021-08-24 DIAGNOSIS — E1122 Type 2 diabetes mellitus with diabetic chronic kidney disease: Secondary | ICD-10-CM

## 2021-08-24 DIAGNOSIS — N186 End stage renal disease: Secondary | ICD-10-CM | POA: Diagnosis not present

## 2021-08-24 DIAGNOSIS — N184 Chronic kidney disease, stage 4 (severe): Secondary | ICD-10-CM

## 2021-08-24 NOTE — Progress Notes (Signed)
Tried numerous times to speak with pt but just kept getting his voicemail. I finally left pre-op instructions on his voicemail. I instructed him to take 1/2 of his regular dose Tresiba insulin in the AM, he will take 12 units. Instructed him not to take his Glipizide or Tradjenta in the AM. Instructed him to check his blood sugar when he gets up and every 2 hours until he leaves for the hospital. If blood sugar is 70 or below, treat with 1/2 cup of clear juice (apple or cranberry) and recheck blood sugar 15 minutes after drinking juice. If blood sugar continues to be 70 or below, call the Short Stay department and ask to speak to a nurse.  Pt did call me back after I left the instructions and left a voicemail stating that he had received the instructions.   Pt's surgery is scheduled as ambulatory so no Covid test is required prior to surgery.

## 2021-08-24 NOTE — Progress Notes (Deleted)
Cardiology Office Note:    Date:  08/24/2021   ID:  PRANEETH BUSSEY, DOB [birth year], MRN [id number]  PCP:  Cipriano Mile, NP   [facility] Providers Cardiologist:  None { Click to update primary MD,subspecialty MD or APP then REFRESH:1}    Referring MD: Cipriano Mile, NP   CC: *** Consulted for the evaluation of syncope at the [place] of Cipriano Mile, NP   History of Present Illness:    Jonathan Kane is a 75 y.o. male with a hx of ESRD, HTN with DM, hx of Seizures, prior cocaine use who presents for evaluation 08/25/21.  Patient notes that he is feeling ***.  Has had no chest pain, chest pressure, chest tightness, chest stinging ***.  Discomfort occurs with ***, worsens with ***, and improves with ***.  Patient exertion notable for *** with *** and feels no symptoms.  No shortness of breath, DOE ***.  No PND or orthopnea***.  No weight gain***, leg swelling ***, or abdominal swelling***.  No syncope or near syncope ***. Notes *** no palpitations or funny heart beats.   No leg claudication.  Patient reports prior cardiac testing including *** echo, *** stress test, *** heart catheterizations, *** cardioversion, *** ablations.  No history of ***pre-eclampsia, gestation HTN or gestational DM.  No Fen-Phen or drug use***.  Ambulatory BP ***.    Past Medical History:  Diagnosis Date   Diabetes mellitus without complication ([place])    End stage kidney disease ([place])    Erectile dysfunction    Hypertension    Vitamin D deficiency     Past Surgical History:  Procedure Laterality Date   COLONOSCOPY     IR DIALY SHUNT INTRO NEEDLE/INTRACATH INITIAL W/IMG LEFT Left 06/25/2020   IR REMOVAL TUN CV CATH W/O FL  06/03/2021   keloid removed     TEE WITHOUT CARDIOVERSION N/A 06/04/2021   Procedure: TRANSESOPHAGEAL ECHOCARDIOGRAM (TEE);  Surgeon: Skeet Latch, MD;  Location: [facility] ENDOSCOPY;  Service: Cardiovascular;  Laterality: N/A;    Current Medications: No outpatient medications  have been marked as taking for the 08/25/21 encounter (Appointment) with Werner Lean, MD.     Allergies:   Dulaglutide   Social History   Socioeconomic History   Marital status: Divorced    Spouse name: Not on file   Number of children: 2   Years of education: 12   Highest education level: High school graduate  Occupational History   Occupation: Retired  Tobacco Use   Smoking status: Former   Smokeless tobacco: Never  Scientific laboratory technician Use: Never used  Substance and Sexual Activity   Alcohol use: Yes    Comment: very litte - occasional beer   Drug use: No   Sexual activity: Not on file  Other Topics Concern   Not on file  Social History Narrative   Lives alone.   Right-handed.   Caffeine use: one cups per day.   Social Determinants of Health   Financial Resource Strain: Not on file  Food Insecurity: Not on file  Transportation Needs: Not on file  Physical Activity: Not on file  Stress: Not on file  Social Connections: Not on file     Family History: The patient's ***family history includes Other in his father; Ovarian cancer in his mother.  ROS:   Please see the history of present illness.    *** All other systems reviewed and are negative.  EKGs/Labs/Other Studies Reviewed:    The following studies  were reviewed today: ***  EKG:  EKG is *** ordered today.  The ekg ordered today demonstrates *** 08/25/21: ***  Transthoracic Echocardiogram: Date:06/01/21 Results: Concentric LVH; through chart review this does seem consistent with HTN/ESRD related remodeling  1. TTE 05/28/20 had abnormal strain -11.6 not done on this study Clinical  history suggests severe LVH due to poorly controlled HTN and not  infiltrative DCM . Left ventricular ejection fraction, by estimation, is  60 to 65%. The left ventricle has normal  function. The left ventricle has no regional wall motion abnormalities.  The left ventricular internal cavity size was moderately  dilated. There is  severe left ventricular hypertrophy. Left ventricular diastolic parameters  are consistent with Grade I  diastolic dysfunction (impaired relaxation).   2. Right ventricular systolic function is normal. The right ventricular  size is normal.   3. Right atrial size was mildly dilated.   4. The mitral valve is normal in structure. No evidence of mitral valve  regurgitation. No evidence of mitral stenosis.   5. The aortic valve is tricuspid. Aortic valve regurgitation is not  visualized. Mild to moderate aortic valve sclerosis/calcification is  present, without any evidence of aortic stenosis.   6. The inferior vena cava is normal in size with greater than 50%  respiratory variability, suggesting right atrial pressure of 3 mmHg.   Transesophageal Echocardiogram: Date: 06/04/21 Results:  1. Left ventricular ejection fraction, by estimation, is 60 to 65%. The  left ventricle has normal function. The left ventricle has no regional  wall motion abnormalities. There is severe concentric left ventricular  hypertrophy.   2. Right ventricular systolic function is normal. The right ventricular  size is normal.   3. No left atrial/left atrial appendage thrombus was detected.   4. The mitral valve is normal in structure. Trivial mitral valve  regurgitation. No evidence of mitral stenosis.   5. The aortic valve is tricuspid. Aortic valve regurgitation is not  visualized. No aortic stenosis is present.   6. The inferior vena cava is normal in size with greater than 50%  respiratory variability, suggesting right atrial pressure of 3 mmHg.    Recent Labs: 04/13/2021: TSH 1.450 08/04/2021: Magnesium 2.4 08/07/2021: ALT 16 08/11/2021: BUN 36; Creatinine, Ser 8.06; Hemoglobin 11.1; Platelets 173; Potassium 4.1; Sodium 132  Recent Lipid Panel    Component Value Date/Time   CHOL 89 06/01/2021 0316   TRIG 103 06/01/2021 0317   HDL 32 (L) 06/01/2021 0316   CHOLHDL 2.8 06/01/2021  0316   VLDL 20 06/01/2021 0316   LDLCALC 37 06/01/2021 0316     Risk Assessment/Calculations:   {Does this patient have ATRIAL FIBRILLATION?[phone]}       Physical Exam:    VS:  There were no vitals taken for this visit.    Wt Readings from Last 3 Encounters:  08/24/21 144 lb (65.3 kg)  08/11/21 141 lb 15.6 oz (64.4 kg)  08/04/21 140 lb (63.5 kg)     GEN: *** Well nourished, well developed in no acute distress HEENT: Normal NECK: No JVD; No carotid bruits LYMPHATICS: No lymphadenopathy CARDIAC: ***RRR, no murmurs, rubs, gallops RESPIRATORY:  Clear to auscultation without rales, wheezing or rhonchi  ABDOMEN: Soft, non-tender, non-distended MUSCULOSKELETAL:  No edema; No deformity  SKIN: Warm and dry NEUROLOGIC:  Alert and oriented x 3 PSYCHIATRIC:  Normal affect   ASSESSMENT:    No diagnosis found. PLAN:    Syncope In the setting of HTN with DM and ESRD - ***      {  Are you ordering a CV Procedure (e.g. stress test, cath, DCCV, TEE, etc)?   Press F2        :[id number]}    Medication Adjustments/Labs and Tests Ordered: Current medicines are reviewed at length with the patient today.  Concerns regarding medicines are outlined above.  No orders of the defined types were placed in this encounter.  No orders of the defined types were placed in this encounter.   There are no Patient Instructions on file for this visit.   Signed, Werner Lean, MD  08/24/2021 6:11 PM    [facility]

## 2021-08-24 NOTE — Progress Notes (Addendum)
Spoke with pt's daughter, Akhil Piscopo for pre-op call. DPR on file. She reviewed pt's medical history, she states pt does not have a cardiac history. Pt is a type 2 Diabetic. Last A1C was 10.6 on 08/08/21. Pt does have a new onset seizures in October. Was an inpatient here.  Clarene Critchley states she was not really aware that her father was scheduled for surgery today. I told her that I would call her father and give him the pre-op instructions. I told her if I did not get him, I would call her back and give them to her. She voiced understanding.  Pt's surgery is scheduled as ambulatory so no Covid test is required prior to surgery.

## 2021-08-24 NOTE — Progress Notes (Signed)
Patient name: Jonathan Kane MRN: [id number] DOB: Apr 13, 1946 Sex: male  REASON FOR CONSULT: Evaluate for new dialysis access  HPI: Jonathan Kane is a 74 y.o. male, with history of hypertension and diabetes and ESRD that presents for evaluation of new dialysis access.  He had a left arm radiocephalic fistula that worked for about 3 years.  He states this was placed in [facility].  This has now thrombosed and he has a right IJ tunneled catheter.  Patient states he is right-handed.  He has had no other dialysis access other than the left radiocephalic.  Past Medical History:  Diagnosis Date   Diabetes mellitus without complication ([place])    End stage kidney disease ([place])    Erectile dysfunction    Hypertension    Vitamin D deficiency     Past Surgical History:  Procedure Laterality Date   COLONOSCOPY     IR DIALY SHUNT INTRO NEEDLE/INTRACATH INITIAL W/IMG LEFT Left 06/25/2020   IR REMOVAL TUN CV CATH W/O FL  06/03/2021   keloid removed     TEE WITHOUT CARDIOVERSION N/A 06/04/2021   Procedure: TRANSESOPHAGEAL ECHOCARDIOGRAM (TEE);  Surgeon: Skeet Latch, MD;  Location: [facility] ENDOSCOPY;  Service: Cardiovascular;  Laterality: N/A;    Family History  Problem Relation Age of Onset   Ovarian cancer Mother    Other Father        unsure of medical history    SOCIAL HISTORY: Social History   Socioeconomic History   Marital status: Divorced    Spouse name: Not on file   Number of children: 2   Years of education: 12   Highest education level: High school graduate  Occupational History   Occupation: Retired  Tobacco Use   Smoking status: Former   Smokeless tobacco: Never  Scientific laboratory technician Use: Never used  Substance and Sexual Activity   Alcohol use: Yes    Comment: very litte - occasional beer   Drug use: No   Sexual activity: Not on file  Other Topics Concern   Not on file  Social History Narrative   Lives alone.   Right-handed.   Caffeine use: one cups per day.    Social Determinants of Health   Financial Resource Strain: Not on file  Food Insecurity: Not on file  Transportation Needs: Not on file  Physical Activity: Not on file  Stress: Not on file  Social Connections: Not on file  Intimate Partner Violence: Not on file    Allergies  Allergen Reactions   Dulaglutide     TRULICITY:  GI Upset intolerance    Current Outpatient Medications  Medication Sig Dispense Refill   acetaminophen (TYLENOL) 325 MG tablet Take 2 tablets (650 mg total) by mouth every 6 (six) hours as needed for fever, mild pain or headache.     aspirin EC 81 MG tablet Take 81 mg by mouth daily.     atorvastatin (LIPITOR) 40 MG tablet Take 40 mg by mouth at bedtime.     cholecalciferol (VITAMIN D3) 25 MCG (1000 UNIT) tablet Take 1,000 Units by mouth daily.     diltiazem (TIAZAC) 180 MG 24 hr capsule Take 180 mg by mouth daily.     glipiZIDE (GLUCOTROL) 10 MG tablet Take 10 mg by mouth daily.     hydrALAZINE (APRESOLINE) 25 MG tablet Take 1 tablet (25 mg total) by mouth every 8 (eight) hours. 90 tablet 0   latanoprost (XALATAN) 0.005 % ophthalmic solution Place 1  drop into both eyes at bedtime.     levETIRAcetam (KEPPRA) 500 MG tablet Take 1 tablet (500 mg total) by mouth every Monday, Wednesday, and Friday at 6 PM. Take after hemodialysis on dialysis days. This is in addition to the other prescription. 12 tablet 1   levETIRAcetam (KEPPRA) 750 MG tablet Take 2 tablets (1,500 mg total) by mouth 2 (two) times daily. 120 tablet 1   lidocaine-prilocaine (EMLA) cream Apply 1 application topically 3 (three) times a week.     phenytoin (DILANTIN) 50 MG tablet Chew 1.5 tablets (75 mg total) by mouth 3 (three) times daily. 135 tablet 1   sevelamer carbonate (RENVELA) 800 MG tablet Take 800-2,400 mg by mouth See admin instructions. Take 3 tablets (2400 mg) by mouth with meals and 1 tablet (800 mg) with snacks     sildenafil (REVATIO) 20 MG tablet Take 40-60 mg by mouth daily as needed  (ED).      sodium bicarbonate 650 MG tablet Take 1 tablet (650 mg total) by mouth 2 (two) times daily. 60 tablet 0   TRADJENTA 5 MG TABS tablet Take 5 mg by mouth daily.     TRESIBA FLEXTOUCH 100 UNIT/ML FlexTouch Pen Inject 22 Units into the skin daily.     No current facility-administered medications for this visit.    REVIEW OF SYSTEMS:  [X]  denotes positive finding, [ ]  denotes negative finding Cardiac  Comments:  Chest pain or chest pressure:    Shortness of breath upon exertion:    Short of breath when lying flat:    Irregular heart rhythm:        Vascular    Pain in calf, thigh, or hip brought on by ambulation:    Pain in feet at night that wakes you up from your sleep:     Blood clot in your veins:    Leg swelling:         Pulmonary    Oxygen at home:    Productive cough:     Wheezing:         Neurologic    Sudden weakness in arms or legs:     Sudden numbness in arms or legs:     Sudden onset of difficulty speaking or slurred speech:    Temporary loss of vision in one eye:     Problems with dizziness:         Gastrointestinal    Blood in stool:     Vomited blood:         Genitourinary    Burning when urinating:     Blood in urine:        Psychiatric    Major depression:         Hematologic    Bleeding problems:    Problems with blood clotting too easily:        Skin    Rashes or ulcers:        Constitutional    Fever or chills:      PHYSICAL EXAM: Vitals:   08/24/21 1147  BP: (!) 162/108  Pulse: 82  Resp: 18  Temp: 97.7 F (36.5 C)  TempSrc: Temporal  SpO2: 98%  Weight: 144 lb (65.3 kg)  Height: 5\' 10"  (1.778 m)    GENERAL: The patient is a well-nourished male, in no acute distress. The vital signs are documented above. CARDIAC: There is a regular rate and rhythm.  VASCULAR:  Bilateral radial brachial pulses palpable Right IJ tunneled catheter PULMONARY: No  respiratory distress. ABDOMEN: Soft and non-tender. MUSCULOSKELETAL: There  are no major deformities or cyanosis. NEUROLOGIC: No focal weakness or paresthesias are detected. SKIN: There are no ulcers or rashes noted. PSYCHIATRIC: The patient has a normal affect.  DATA:   Vein mapping shows thrombosed cephalic vein in the left forearm where he has a radiocephalic fistula but a patent cephalic vein above the antecubital fossa that looks large enough for fistula creation  Assessment/Plan:  75 year old male with end-stage renal disease that presents with thrombosed left radiocephalic AV fistula placed in [place] about 3 years ago.  After review of vein mapping the cephalic vein above the antecubital fossa does appear patent and I think he would be a candidate for a left brachiocephalic AVF since the cephalic vein reconstitutes through collaterals in the upper arm.  I discussed surgery at [facility] on outpatient basis.  Risk benefits discussed including risk of steal and failure to mature.  We will get him scheduled this week.  he will call his dialysis center to see if he can dialyze either early tomorrow before surgery or on Thursday.  All questions answered.   Marty Heck, MD [facility] Office: [phone]

## 2021-08-25 ENCOUNTER — Ambulatory Visit (HOSPITAL_COMMUNITY): Payer: Medicare Other | Admitting: Physician Assistant

## 2021-08-25 ENCOUNTER — Ambulatory Visit (HOSPITAL_COMMUNITY)
Admission: RE | Admit: 2021-08-25 | Discharge: 2021-08-25 | Disposition: A | Discharge status: Home / Self Care | Payer: Medicare Other | Attending: Vascular Surgery | Admitting: Vascular Surgery

## 2021-08-25 ENCOUNTER — Encounter (HOSPITAL_COMMUNITY): Payer: Self-pay | Admitting: Vascular Surgery

## 2021-08-25 ENCOUNTER — Ambulatory Visit: Payer: Medicare Other | Admitting: Internal Medicine

## 2021-08-25 ENCOUNTER — Encounter (HOSPITAL_COMMUNITY)
Admission: RE | Disposition: A | Discharge status: Home / Self Care | Payer: Self-pay | Source: Home / Self Care | Attending: Vascular Surgery

## 2021-08-25 ENCOUNTER — Other Ambulatory Visit: Payer: Self-pay

## 2021-08-25 DIAGNOSIS — I120 Hypertensive chronic kidney disease with stage 5 chronic kidney disease or end stage renal disease: Secondary | ICD-10-CM | POA: Insufficient documentation

## 2021-08-25 DIAGNOSIS — Z992 Dependence on renal dialysis: Secondary | ICD-10-CM | POA: Diagnosis not present

## 2021-08-25 DIAGNOSIS — E1122 Type 2 diabetes mellitus with diabetic chronic kidney disease: Secondary | ICD-10-CM | POA: Diagnosis not present

## 2021-08-25 DIAGNOSIS — N186 End stage renal disease: Secondary | ICD-10-CM | POA: Diagnosis not present

## 2021-08-25 HISTORY — PX: AV FISTULA PLACEMENT: SHX1204

## 2021-08-25 LAB — POCT I-STAT, CHEM 8
BUN: 63 mg/dL — ABNORMAL HIGH (ref 8–23)
BUN: 38 mg/dL — ABNORMAL HIGH (ref 8–23)
Calcium, Ion: 0.91 mmol/L — ABNORMAL LOW (ref 1.15–1.40)
Calcium, Ion: 1.05 mmol/L — ABNORMAL LOW (ref 1.15–1.40)
Chloride: 105 mmol/L (ref 98–111)
Chloride: 103 mmol/L (ref 98–111)
Creatinine, Ser: 9.3 mg/dL — ABNORMAL HIGH (ref 0.61–1.24)
Creatinine, Ser: 9.1 mg/dL — ABNORMAL HIGH (ref 0.61–1.24)
Glucose, Bld: 141 mg/dL — ABNORMAL HIGH (ref 70–99)
Glucose, Bld: 132 mg/dL — ABNORMAL HIGH (ref 70–99)
HCT: 38 % — ABNORMAL LOW (ref 39.0–52.0)
HCT: 36 % — ABNORMAL LOW (ref 39.0–52.0)
Hemoglobin: 12.9 g/dL — ABNORMAL LOW (ref 13.0–17.0)
Hemoglobin: 12.2 g/dL — ABNORMAL LOW (ref 13.0–17.0)
Potassium: 7.6 mmol/L (ref 3.5–5.1)
Potassium: 3.9 mmol/L (ref 3.5–5.1)
Sodium: 136 mmol/L (ref 135–145)
Sodium: 141 mmol/L (ref 135–145)
TCO2: 25 mmol/L (ref 22–32)
TCO2: 27 mmol/L (ref 22–32)

## 2021-08-25 LAB — SURGICAL PCR SCREEN
MRSA, PCR: NEGATIVE
Staphylococcus aureus: NEGATIVE

## 2021-08-25 LAB — GLUCOSE, CAPILLARY
Glucose-Capillary: 145 mg/dL — ABNORMAL HIGH (ref 70–99)
Glucose-Capillary: 147 mg/dL — ABNORMAL HIGH (ref 70–99)

## 2021-08-25 SURGERY — ARTERIOVENOUS (AV) FISTULA CREATION
Anesthesia: General | Site: Arm Upper | Laterality: Left

## 2021-08-25 MED ORDER — HEPARIN SODIUM (PORCINE) 1000 UNIT/ML IJ SOLN
INTRAMUSCULAR | Status: DC | PRN
  Administered 2021-08-25: 3000 [IU] via INTRAVENOUS

## 2021-08-25 MED ORDER — FENTANYL CITRATE (PF) 250 MCG/5ML IJ SOLN
INTRAMUSCULAR | Status: AC
  Filled 2021-08-25: qty 5

## 2021-08-25 MED ORDER — PROPOFOL 500 MG/50ML IV EMUL
INTRAVENOUS | Status: DC | PRN
  Administered 2021-08-25: 150 ug/kg/min via INTRAVENOUS

## 2021-08-25 MED ORDER — MIDAZOLAM HCL 2 MG/2ML IJ SOLN: 1.0000 mg | Freq: Once | INTRAMUSCULAR | Status: AC

## 2021-08-25 MED ORDER — HEPARIN 6000 UNIT IRRIGATION SOLUTION
Status: AC
  Filled 2021-08-25: qty 500

## 2021-08-25 MED ORDER — CEFAZOLIN SODIUM-DEXTROSE 2-4 GM/100ML-% IV SOLN
2.0000 g | INTRAVENOUS | Status: DC
  Filled 2021-08-25: qty 100

## 2021-08-25 MED ORDER — MIDAZOLAM HCL 2 MG/2ML IJ SOLN
INTRAMUSCULAR | Status: AC
  Administered 2021-08-25: 1 mg via INTRAVENOUS
  Filled 2021-08-25: qty 2

## 2021-08-25 MED ORDER — HYDRALAZINE HCL 20 MG/ML IJ SOLN
INTRAMUSCULAR | Status: AC
  Administered 2021-08-25: 10 mg via INTRAVENOUS
  Filled 2021-08-25: qty 1

## 2021-08-25 MED ORDER — ACETAMINOPHEN 500 MG PO TABS: 1000.0000 mg | ORAL_TABLET | Freq: Once | ORAL | Status: DC | PRN

## 2021-08-25 MED ORDER — SODIUM CHLORIDE 0.9 % IV SOLN: INTRAVENOUS | Status: DC

## 2021-08-25 MED ORDER — HYDRALAZINE HCL 25 MG PO TABS
25.0000 mg | ORAL_TABLET | Freq: Once | ORAL | Status: AC
  Administered 2021-08-25: 25 mg via ORAL
  Filled 2021-08-25: qty 1

## 2021-08-25 MED ORDER — FENTANYL CITRATE (PF) 100 MCG/2ML IJ SOLN
INTRAMUSCULAR | Status: AC
  Administered 2021-08-25: 50 ug via INTRAVENOUS
  Filled 2021-08-25: qty 2

## 2021-08-25 MED ORDER — HEPARIN 6000 UNIT IRRIGATION SOLUTION
Status: DC | PRN
  Administered 2021-08-25: 1

## 2021-08-25 MED ORDER — CHLORHEXIDINE GLUCONATE 0.12 % MT SOLN
15.0000 mL | Freq: Once | OROMUCOSAL | Status: AC
  Filled 2021-08-25: qty 15

## 2021-08-25 MED ORDER — CHLORHEXIDINE GLUCONATE 4 % EX LIQD: 60.0000 mL | Freq: Once | CUTANEOUS | Status: DC

## 2021-08-25 MED ORDER — FENTANYL CITRATE (PF) 100 MCG/2ML IJ SOLN: 50.0000 ug | Freq: Once | INTRAMUSCULAR | Status: AC

## 2021-08-25 MED ORDER — OXYCODONE HCL 5 MG/5ML PO SOLN: 5.0000 mg | Freq: Once | ORAL | Status: DC | PRN

## 2021-08-25 MED ORDER — HYDROCODONE-ACETAMINOPHEN 5-325 MG PO TABS
1.0000 | ORAL_TABLET | Freq: Four times a day (QID) | ORAL | 0 refills | Status: AC | PRN
Start: 2021-08-25 — End: ?

## 2021-08-25 MED ORDER — PROPOFOL 10 MG/ML IV BOLUS
INTRAVENOUS | Status: AC
  Filled 2021-08-25: qty 20

## 2021-08-25 MED ORDER — ACETAMINOPHEN 160 MG/5ML PO SOLN: 1000.0000 mg | Freq: Once | ORAL | Status: DC | PRN

## 2021-08-25 MED ORDER — HYDRALAZINE HCL 20 MG/ML IJ SOLN
10.0000 mg | Freq: Once | INTRAMUSCULAR | Status: AC
  Administered 2021-08-25: 10 mg via INTRAVENOUS
  Filled 2021-08-25: qty 0.5

## 2021-08-25 MED ORDER — PAPAVERINE HCL 30 MG/ML IJ SOLN
INTRAMUSCULAR | Status: AC
  Filled 2021-08-25: qty 2

## 2021-08-25 MED ORDER — ACETAMINOPHEN 10 MG/ML IV SOLN: 1000.0000 mg | Freq: Once | INTRAVENOUS | Status: DC | PRN

## 2021-08-25 MED ORDER — LIDOCAINE HCL (PF) 1 % IJ SOLN
INTRAMUSCULAR | Status: AC
  Filled 2021-08-25: qty 30

## 2021-08-25 MED ORDER — LEVETIRACETAM 750 MG PO TABS
1500.0000 mg | ORAL_TABLET | Freq: Once | ORAL | Status: AC
  Administered 2021-08-25: 1500 mg via ORAL
  Filled 2021-08-25: qty 2

## 2021-08-25 MED ORDER — PHENYLEPHRINE 40 MCG/ML (10ML) SYRINGE FOR IV PUSH (FOR BLOOD PRESSURE SUPPORT)
PREFILLED_SYRINGE | INTRAVENOUS | Status: DC | PRN
  Administered 2021-08-25: 120 ug via INTRAVENOUS
  Administered 2021-08-25: 160 ug via INTRAVENOUS
  Administered 2021-08-25: 120 ug via INTRAVENOUS

## 2021-08-25 MED ORDER — PROPOFOL 1000 MG/100ML IV EMUL
INTRAVENOUS | Status: AC
  Filled 2021-08-25: qty 100

## 2021-08-25 MED ORDER — CHLORHEXIDINE GLUCONATE 0.12 % MT SOLN
OROMUCOSAL | Status: AC
  Administered 2021-08-25: 15 mL via OROMUCOSAL
  Filled 2021-08-25: qty 15

## 2021-08-25 MED ORDER — VANCOMYCIN HCL IN DEXTROSE 1-5 GM/200ML-% IV SOLN
1000.0000 mg | INTRAVENOUS | Status: AC
  Administered 2021-08-25: 1000 mg via INTRAVENOUS
  Filled 2021-08-25: qty 200

## 2021-08-25 MED ORDER — OXYCODONE HCL 5 MG PO TABS: 5.0000 mg | ORAL_TABLET | Freq: Once | ORAL | Status: DC | PRN

## 2021-08-25 MED ORDER — FENTANYL CITRATE (PF) 100 MCG/2ML IJ SOLN: 25.0000 ug | INTRAMUSCULAR | Status: DC | PRN

## 2021-08-25 MED ORDER — HYDRALAZINE HCL 20 MG/ML IJ SOLN: 10.0000 mg | Freq: Once | INTRAMUSCULAR | Status: AC

## 2021-08-25 MED ORDER — 0.9 % SODIUM CHLORIDE (POUR BTL) OPTIME
TOPICAL | Status: DC | PRN
  Administered 2021-08-25: 1000 mL

## 2021-08-25 SURGICAL SUPPLY — 36 items
ADH SKN CLS APL DERMABOND .7 (GAUZE/BANDAGES/DRESSINGS) ×1
AGENT HMST SPONGE THK3/8 (HEMOSTASIS)
ARMBAND PINK RESTRICT EXTREMIT (MISCELLANEOUS) ×4 IMPLANT
BAG COUNTER SPONGE SURGICOUNT (BAG) ×2 IMPLANT
BAG SPNG CNTER NS LX DISP (BAG) ×1
CANISTER SUCT 3000ML PPV (MISCELLANEOUS) ×2 IMPLANT
CLIP VESOCCLUDE MED 6/CT (CLIP) ×2 IMPLANT
CLIP VESOCCLUDE SM WIDE 6/CT (CLIP) ×2 IMPLANT
COVER PROBE W GEL 5X96 (DRAPES) ×2 IMPLANT
DERMABOND ADVANCED (GAUZE/BANDAGES/DRESSINGS) ×1
DERMABOND ADVANCED .7 DNX12 (GAUZE/BANDAGES/DRESSINGS) ×1 IMPLANT
ELECT REM PT RETURN 9FT ADLT (ELECTROSURGICAL) ×2
ELECTRODE REM PT RTRN 9FT ADLT (ELECTROSURGICAL) ×1 IMPLANT
GAUZE 4X4 16PLY ~~LOC~~+RFID DBL (SPONGE) ×1 IMPLANT
GLOVE SRG 8 PF TXTR STRL LF DI (GLOVE) ×1 IMPLANT
GLOVE SURG ENC MOIS LTX SZ7.5 (GLOVE) ×2 IMPLANT
GLOVE SURG UNDER POLY LF SZ8 (GLOVE) ×2
GOWN STRL REUS W/ TWL LRG LVL3 (GOWN DISPOSABLE) ×2 IMPLANT
GOWN STRL REUS W/ TWL XL LVL3 (GOWN DISPOSABLE) ×2 IMPLANT
GOWN STRL REUS W/TWL LRG LVL3 (GOWN DISPOSABLE) ×4
GOWN STRL REUS W/TWL XL LVL3 (GOWN DISPOSABLE) ×4
HEMOSTAT SPONGE AVITENE ULTRA (HEMOSTASIS) IMPLANT
KIT BASIN OR (CUSTOM PROCEDURE TRAY) ×2 IMPLANT
KIT TURNOVER KIT B (KITS) ×2 IMPLANT
NS IRRIG 1000ML POUR BTL (IV SOLUTION) ×2 IMPLANT
PACK CV ACCESS (CUSTOM PROCEDURE TRAY) ×2 IMPLANT
PAD ARMBOARD 7.5X6 YLW CONV (MISCELLANEOUS) ×4 IMPLANT
SPONGE T-LAP 18X18 ~~LOC~~+RFID (SPONGE) ×1 IMPLANT
SUT MNCRL AB 4-0 PS2 18 (SUTURE) ×2 IMPLANT
SUT PROLENE 6 0 BV (SUTURE) ×4 IMPLANT
SUT PROLENE 7 0 BV 1 (SUTURE) IMPLANT
SUT VIC AB 3-0 SH 27 (SUTURE) ×2
SUT VIC AB 3-0 SH 27X BRD (SUTURE) ×1 IMPLANT
TOWEL GREEN STERILE (TOWEL DISPOSABLE) ×2 IMPLANT
UNDERPAD 30X36 HEAVY ABSORB (UNDERPADS AND DIAPERS) ×2 IMPLANT
WATER STERILE IRR 1000ML POUR (IV SOLUTION) ×2 IMPLANT

## 2021-08-25 NOTE — Discharge Instructions (Signed)
° °  [facility] [place] ° °Discharge Instructions ° °AV Fistula or Graft Surgery for Dialysis Access ° °Please refer to the following instructions for your post-procedure care. Your surgeon or physician assistant will discuss any changes with you. ° °Activity ° °You may drive the day following your surgery, if you are comfortable and no longer taking prescription pain medication. Resume full activity as the soreness in your incision resolves. ° °Bathing/Showering ° °You may shower after you go home. Keep your incision dry for 48 hours. Do not soak in a bathtub, hot tub, or swim until the incision heals completely. You may not shower if you have a hemodialysis catheter. ° °Incision Care ° °Clean your incision with mild soap and water after 48 hours. Pat the area dry with a clean towel. You do not need a bandage unless otherwise instructed. Do not apply any ointments or creams to your incision. You may have skin glue on your incision. Do not peel it off. It will come off on its own in about one week. Your arm may swell a bit after surgery. To reduce swelling use pillows to elevate your arm so it is above your heart. Your doctor will tell you if you need to lightly wrap your arm with an ACE bandage. ° °Diet ° °Resume your normal diet. There are not special food restrictions following this procedure. In order to heal from your surgery, it is CRITICAL to get adequate nutrition. Your body requires vitamins, minerals, and protein. Vegetables are the best source of vitamins and minerals. Vegetables also provide the perfect balance of protein. Processed food has little nutritional value, so try to avoid this. ° °Medications ° °Resume taking all of your medications. If your incision is causing pain, you may take over-the counter pain relievers such as acetaminophen (Tylenol). If you were prescribed a stronger pain medication, please be aware these medications can cause nausea and constipation. Prevent  nausea by taking the medication with a snack or meal. Avoid constipation by drinking plenty of fluids and eating foods with high amount of fiber, such as fruits, vegetables, and grains. Do not take Tylenol if you are taking prescription pain medications. ° ° ° ° °Follow up °Your surgeon may want to see you in the office following your access surgery. If so, this will be arranged at the time of your surgery. ° °Please call us immediately for any of the following conditions: ° °Increased pain, redness, drainage (pus) from your incision site °Fever of 101 degrees or higher °Severe or worsening pain at your incision site °Hand pain or numbness. ° °Reduce your risk of vascular disease: ° °Stop smoking. If you would like help, call [facility] at 1-800-QUIT-NOW (1-800-784-8669) or [facility] at 336-586-4000 ° °Manage your cholesterol °Maintain a desired weight °Control your diabetes °Keep your blood pressure down ° °Dialysis ° °It will take several weeks to several months for your new dialysis access to be ready for use. Your surgeon will determine when it is OK to use it. Your nephrologist will continue to direct your dialysis. You can continue to use your Permcath until your new access is ready for use. ° °If you have any questions, please call the office at [phone]. ° °

## 2021-08-25 NOTE — Anesthesia Procedure Notes (Signed)
Procedure Name: MAC Date/Time: 08/25/2021 12:24 PM Performed by: Barrington Ellison, CRNA Pre-anesthesia Checklist: Patient identified, Emergency Drugs available, Suction available, Patient being monitored and Timeout performed Patient Re-evaluated:Patient Re-evaluated prior to induction Oxygen Delivery Method: Simple face mask

## 2021-08-25 NOTE — Op Note (Addendum)
OPERATIVE NOTE   PROCEDURE: left brachiocephalic arteriovenous fistula placement  PRE-OPERATIVE DIAGNOSIS: End stage renal disease  POST-OPERATIVE DIAGNOSIS: same as above   SURGEON: Marty Heck, MD  ASSISTANT(S): Leontine Locket, PA  ANESTHESIA: regional  ESTIMATED BLOOD LOSS: <50 mL  FINDING(S): 1.  Cephalic vein: 4-5 mm, acceptable 2.  Brachial artery: 6 mm, atherosclerotic disease evident 3.  Venous outflow: palpable thrill  4.  Radial flow: palpable radial pulse  SPECIMEN(S):  none  INDICATIONS:   Jonathan Kane is a 75 y.o. male who presents with end stage renal disease and need for permanent hemodialysis access.  The patient has a thrombosed left radiocephalic fistula placed at outside hospital.  The patient is scheduled for left brachiocephalic arteriovenous fistula placement.  The patient is aware the risks include but are not limited to: bleeding, infection, steal syndrome, nerve damage, ischemic monomelic neuropathy, failure to mature, and need for additional procedures.  The patient is aware of the risks of the procedure and elects to proceed forward.  An assistant was needed for exposure and to expedite the case.   DESCRIPTION: After full informed written consent was obtained from the patient, the patient was brought back to the operating room and placed supine upon the operating table.  Prior to induction, the patient received IV antibiotics.   After obtaining adequate anesthesia, the patient was then prepped and draped in the standard fashion for a left arm access procedure.  I turned my attention first to identifying the patient's cephalic vein and brachial artery.  Using SonoSite guidance, the location of these vessels were marked out on the skin.     I made a transverse incision at the level of the antecubitum and dissected through the subcutaneous tissue and fascia to gain exposure of the brachial artery.  This was noted to be 6 mm in diameter  externally.  This was dissected out proximally and distally and controlled with vessel loops .  I then dissected out the cephalic vein.  This was noted to be 4-5 mm in diameter externally.  The distal segment of the vein was ligated with a  2-0 silk, and the vein was transected.  The proximal segment was interrogated with serial dilators.  The vein accepted up to a 5 mm dilator without any difficulty.  I then instilled the heparinized saline into the vein and clamped it.  At this point, I reset my exposure of the brachial artery.  The patient was given 3000 units IV heparin.  I then placed the artery under tension proximally and distally.  I made an arteriotomy with a #11 blade, and then I extended the arteriotomy with a Potts scissor.  I injected heparinized saline proximal and distal to this arteriotomy.  The vein was then sewn to the artery in an end-to-side configuration with a running stitch of 6-0 Prolene.  Prior to completing this anastomosis, I allowed the vein and artery to backbleed.  There was no evidence of clot from any vessels.  I completed the anastomosis in the usual fashion and then released all vessel loops and clamps.    There was a palpable thrill in the venous outflow, and there was a palpable radial pulse.  At this point, I irrigated out the surgical wound.  There was no further active bleeding.  The subcutaneous tissue was reapproximated with a running stitch of 3-0 Vicryl.  The skin was then reapproximated with a running subcuticular stitch of 4-0 Monocryl.  The skin was then  cleaned, dried, and reinforced with Dermabond.  The patient tolerated this procedure well.   COMPLICATIONS: None  CONDITION: Stable  Marty Heck, MD [facility] Office: [place]   08/25/2021, 1:40 PM

## 2021-08-25 NOTE — Progress Notes (Signed)
Orthopedic Tech Progress Note Patient Details:  Jonathan Kane 03-Jan-1946 [id number]  PACU RN called requesting an ARM SLING for patient   Ortho Devices Type of Ortho Device: Arm sling Ortho Device/Splint Location: LUE Ortho Device/Splint Interventions: Ordered, Other (comment)   Post Interventions Patient Tolerated: Well Instructions Provided: Care of [name] 08/25/2021, 2:17 PM

## 2021-08-25 NOTE — Transfer of Care (Signed)
Immediate Anesthesia Transfer of Care Note  Patient: Jonathan Kane  Procedure(s) Performed: LEFT BRACHIOCEPHALIC ARTERIOVENOUS (AV) FISTULA CREATION (Left: Arm Upper)  Patient Location: PACU  Anesthesia Type:MAC and Regional  Level of Consciousness: oriented and drowsy  Airway & Oxygen Therapy: Patient Spontanous Breathing  Post-op Assessment: Report given to RN  Post vital signs: Reviewed and stable  Last Vitals:  Vitals Value Taken Time  BP 128/71 08/25/21 1347  Temp    Pulse 95 08/25/21 1348  Resp 14 08/25/21 1348  SpO2 100 % 08/25/21 1348  Vitals shown include unvalidated device data.  Last Pain:  Vitals:   08/25/21 1005  PainSc: 0-No pain         Complications: No notable events documented.

## 2021-08-25 NOTE — H&P (Signed)
History and Physical Interval Note:  08/25/2021 11:51 AM  Jonathan Kane  has presented today for surgery, with the diagnosis of ESRD.  The various methods of treatment have been discussed with the patient and family. After consideration of risks, benefits and other options for treatment, the patient has consented to  Procedure(s): ARTERIOVENOUS (AV) FISTULA CREATION (Left) as a surgical intervention.  The patient's history has been reviewed, patient examined, no change in status, stable for surgery.  I have reviewed the patient's chart and labs.  Questions were answered to the patient's satisfaction.    Left arm AVF  Marty Heck  Patient name: Jonathan Kane          MRN: [id number]        DOB: [birth year]            Sex: male   REASON FOR CONSULT: Evaluate for new dialysis access   HPI: Jonathan Kane is a 75 y.o. male, with history of hypertension and diabetes and ESRD that presents for evaluation of new dialysis access.  He had a left arm radiocephalic fistula that worked for about 3 years.  He states this was placed in [facility].  This has now thrombosed and he has a right IJ tunneled catheter.  Patient states he is right-handed.  He has had no other dialysis access other than the left radiocephalic.       Past Medical History:  Diagnosis Date   Diabetes mellitus without complication ([place])     End stage kidney disease ([place])     Erectile dysfunction     Hypertension     Vitamin D deficiency             Past Surgical History:  Procedure Laterality Date   COLONOSCOPY       IR DIALY SHUNT INTRO NEEDLE/INTRACATH INITIAL W/IMG LEFT Left 06/25/2020   IR REMOVAL TUN CV CATH W/O FL   06/03/2021   keloid removed       TEE WITHOUT CARDIOVERSION N/A 06/04/2021    Procedure: TRANSESOPHAGEAL ECHOCARDIOGRAM (TEE);  Surgeon: Skeet Latch, MD;  Location[facility] ENDOSCOPY;  Service: Cardiovascular;  Laterality: N/A;           Family History  Problem Relation Age of Onset    Ovarian cancer Mother     Other Father          unsure of medical history      SOCIAL HISTORY: Social History         Socioeconomic History   Marital status: Divorced      Spouse name: Not on file   Number of children: 2   Years of education: 12   Highest education level: High school graduate  Occupational History   Occupation: Retired  Tobacco Use   Smoking status: Former   Smokeless tobacco: Never  Scientific laboratory technician Use: Never used  Substance and Sexual Activity   Alcohol use: Yes      Comment: very litte - occasional beer   Drug use: No   Sexual activity: Not on file  Other Topics Concern   Not on file  Social History Narrative    Lives alone.    Right-handed.    Caffeine use: one cups per day.    Social Determinants of Health    Financial Resource Strain: Not on file  Food Insecurity: Not on file  Transportation Needs: Not on file  Physical Activity: Not on file  Stress: Not on file  Social Connections: Not on file  Intimate Partner Violence: Not on file           Allergies  Allergen Reactions   Dulaglutide        TRULICITY:  GI Upset intolerance            Current Outpatient Medications  Medication Sig Dispense Refill   acetaminophen (TYLENOL) 325 MG tablet Take 2 tablets (650 mg total) by mouth every 6 (six) hours as needed for fever, mild pain or headache.       aspirin EC 81 MG tablet Take 81 mg by mouth daily.       atorvastatin (LIPITOR) 40 MG tablet Take 40 mg by mouth at bedtime.       cholecalciferol (VITAMIN D3) 25 MCG (1000 UNIT) tablet Take 1,000 Units by mouth daily.       diltiazem (TIAZAC) 180 MG 24 hr capsule Take 180 mg by mouth daily.       glipiZIDE (GLUCOTROL) 10 MG tablet Take 10 mg by mouth daily.       hydrALAZINE (APRESOLINE) 25 MG tablet Take 1 tablet (25 mg total) by mouth every 8 (eight) hours. 90 tablet 0   latanoprost (XALATAN) 0.005 % ophthalmic solution Place 1 drop into both eyes at bedtime.       levETIRAcetam  (KEPPRA) 500 MG tablet Take 1 tablet (500 mg total) by mouth every Monday, Wednesday, and Friday at 6 PM. Take after hemodialysis on dialysis days. This is in addition to the other prescription. 12 tablet 1   levETIRAcetam (KEPPRA) 750 MG tablet Take 2 tablets (1,500 mg total) by mouth 2 (two) times daily. 120 tablet 1   lidocaine-prilocaine (EMLA) cream Apply 1 application topically 3 (three) times a week.       phenytoin (DILANTIN) 50 MG tablet Chew 1.5 tablets (75 mg total) by mouth 3 (three) times daily. 135 tablet 1   sevelamer carbonate (RENVELA) 800 MG tablet Take 800-2,400 mg by mouth See admin instructions. Take 3 tablets (2400 mg) by mouth with meals and 1 tablet (800 mg) with snacks       sildenafil (REVATIO) 20 MG tablet Take 40-60 mg by mouth daily as needed (ED).        sodium bicarbonate 650 MG tablet Take 1 tablet (650 mg total) by mouth 2 (two) times daily. 60 tablet 0   TRADJENTA 5 MG TABS tablet Take 5 mg by mouth daily.       TRESIBA FLEXTOUCH 100 UNIT/ML FlexTouch Pen Inject 22 Units into the skin daily.        No current facility-administered medications for this visit.      REVIEW OF SYSTEMS:  [X]  denotes positive finding, [ ]  denotes negative finding Cardiac   Comments:  Chest pain or chest pressure:      Shortness of breath upon exertion:      Short of breath when lying flat:      Irregular heart rhythm:             Vascular      Pain in calf, thigh, or hip brought on by ambulation:      Pain in feet at night that wakes you up from your sleep:       Blood clot in your veins:      Leg swelling:              Pulmonary      Oxygen at home:      Productive  cough:       Wheezing:              Neurologic      Sudden weakness in arms or legs:       Sudden numbness in arms or legs:       Sudden onset of difficulty speaking or slurred speech:      Temporary loss of vision in one eye:       Problems with dizziness:              Gastrointestinal      Blood in  stool:       Vomited blood:              Genitourinary      Burning when urinating:       Blood in urine:             Psychiatric      Major depression:              Hematologic      Bleeding problems:      Problems with blood clotting too easily:             Skin      Rashes or ulcers:             Constitutional      Fever or chills:          PHYSICAL EXAM:    Vitals:    08/24/21 1147  BP: (!) 162/108  Pulse: 82  Resp: 18  Temp: 97.7 F (36.5 C)  TempSrc: Temporal  SpO2: 98%  Weight: 144 lb (65.3 kg)  Height: 5\' 10"  (1.778 m)      GENERAL: The patient is a well-nourished male, in no acute distress. The vital signs are documented above. CARDIAC: There is a regular rate and rhythm.  VASCULAR:  Bilateral radial brachial pulses palpable Right IJ tunneled catheter PULMONARY: No respiratory distress. ABDOMEN: Soft and non-tender. MUSCULOSKELETAL: There are no major deformities or cyanosis. NEUROLOGIC: No focal weakness or paresthesias are detected. SKIN: There are no ulcers or rashes noted. PSYCHIATRIC: The patient has a normal affect.   DATA:    Vein mapping shows thrombosed cephalic vein in the left forearm where he has a radiocephalic fistula but a patent cephalic vein above the antecubital fossa that looks large enough for fistula creation   Assessment/Plan:   75 year old male with end-stage renal disease that presents with thrombosed left radiocephalic AV fistula placed in [place] about 3 years ago.  After review of vein mapping the cephalic vein above the antecubital fossa does appear patent and I think he would be a candidate for a left brachiocephalic AVF since the cephalic vein reconstitutes through collaterals in the upper arm.  I discussed surgery at [facility] on outpatient basis.  Risk benefits discussed including risk of steal and failure to mature.  We will get him scheduled this week.  he will call his dialysis center to see if he can dialyze  either early tomorrow before surgery or on Thursday.  All questions answered.     Marty Heck, MD [facility] Office: [phone]

## 2021-08-26 ENCOUNTER — Encounter (HOSPITAL_COMMUNITY): Payer: Self-pay | Admitting: Vascular Surgery

## 2021-08-27 ENCOUNTER — Encounter (HOSPITAL_COMMUNITY): Payer: Self-pay | Admitting: Vascular Surgery

## 2021-08-27 MED ORDER — LIDOCAINE-EPINEPHRINE 2 %-1:100000 IJ SOLN
INTRAMUSCULAR | Status: DC | PRN
  Administered 2021-08-25: 20 mL via PERINEURAL

## 2021-08-27 MED ORDER — MEPIVACAINE HCL (PF) 2 % IJ SOLN
INTRAMUSCULAR | Status: DC | PRN
  Administered 2021-08-25: 8 mL

## 2021-08-27 NOTE — Anesthesia Postprocedure Evaluation (Signed)
Anesthesia Post Note  Patient: DAVYON FISCH  Procedure(s) Performed: LEFT BRACHIOCEPHALIC ARTERIOVENOUS (AV) FISTULA CREATION (Left: Arm Upper)     Patient location during evaluation: PACU Anesthesia Type: MAC and Regional Level of consciousness: awake and alert Pain management: pain level controlled Vital Signs Assessment: post-procedure vital signs reviewed and stable Respiratory status: spontaneous breathing, nonlabored ventilation, respiratory function stable and patient connected to nasal cannula oxygen Cardiovascular status: stable and blood pressure returned to baseline Postop Assessment: no apparent nausea or vomiting Anesthetic complications: no   No notable events documented.  Last Vitals:  Vitals:   08/25/21 1402 08/25/21 1417  BP: 130/74 133/77  Pulse: 95 89  Resp: 13 14  Temp:  36.5 C  SpO2: 100% 100%    Last Pain:  Vitals:   08/25/21 1417  PainSc: 0-No pain                 Rajah Tagliaferro

## 2021-08-27 NOTE — Anesthesia Procedure Notes (Signed)
Anesthesia Regional Block: Supraclavicular block   Pre-Anesthetic Checklist: , timeout performed,  Correct Patient, Correct Site, Correct Laterality,  Correct Procedure, Correct Position, site marked,  Risks and benefits discussed,  Surgical consent,  Pre-op evaluation,  At surgeon's request and post-op pain management  Laterality: Left and Upper  Prep: chloraprep       Needles:  Injection technique: Single-shot      Needle Length: 9cm  Needle Gauge: 22     Additional Needles: Arrow StimuQuik ECHO Echogenic Stimulating PNB Needle  Procedures:,,,, ultrasound used (permanent image in chart),,    Narrative:  Start time: 08/25/2021 11:46 AM End time: 08/25/2021 11:56 AM Injection made incrementally with aspirations every 5 mL.  Performed by: Personally  Anesthesiologist: Oleta Mouse, MD

## 2021-08-27 NOTE — Anesthesia Preprocedure Evaluation (Signed)
Anesthesia Evaluation  Patient identified by MRN, date of birth, ID band Patient awake    Reviewed: Allergy & Precautions, NPO status , Patient's Chart, lab work & pertinent test results  Airway Mallampati: II  TM Distance: >3 FB Neck ROM: Full    Dental  (+) Edentulous Upper, Edentulous Lower   Pulmonary former smoker,    breath sounds clear to auscultation       Cardiovascular hypertension, Pt. on medications  Rhythm:Regular Rate:Normal  06/01/21: TTE 05/28/20 had abnormal strain -11.6 not done on this study Clinical history suggests severe LVH due to poorly controlled HTN and not infiltrative DCM . Left ventricular ejection fraction, by estimation, is 60 to 65%. The left ventricle has normal function. The left ventricle has no regional wall motion abnormalities. The left ventricular internal cavity size was moderately dilated. There is severe left ventricular hypertrophy. Left ventricular diastolic parameters are consistent with Grade I diastolic dysfunction (impaired relaxation). 1. 2. Right ventricular systolic function is normal. The right ventricular size is normal. 3. Right atrial size was mildly dilated. The mitral valve is normal in structure. No evidence of mitral valve regurgitation. No evidence of mitral stenosis. 4. The aortic valve is tricuspid. Aortic valve regurgitation is not visualized. Mild to moderate aortic valve sclerosis/calcification is present, without any evidence of aortic stenosis. 5. The inferior vena cava is normal in size with greater than 50% respiratory variability, suggesting right atrial pressure of 3 mmHg.   Neuro/Psych Seizures -,   Neuromuscular disease (polyneuropathy) CVA    GI/Hepatic   Endo/Other  diabetes, Poorly Controlled, Type 2, Oral Hypoglycemic Agents  Renal/GU CRF, ARF and Renal InsufficiencyRenal disease     Musculoskeletal   Abdominal   Peds  Hematology  (+)  anemia ,   Anesthesia Other Findings   Reproductive/Obstetrics                             Anesthesia Physical Anesthesia Plan  ASA: 3  Anesthesia Plan: MAC and Regional   Post-op Pain Management:    Induction: Intravenous  PONV Risk Score and Plan: 1 and Propofol infusion and Treatment may vary due to age or medical condition  Airway Management Planned: Nasal Cannula  Additional Equipment: None  Intra-op Plan:   Post-operative Plan:   Informed Consent: I have reviewed the patients History and Physical, chart, labs and discussed the procedure including the risks, benefits and alternatives for the proposed anesthesia with the patient or authorized representative who has indicated his/her understanding and acceptance.     Dental advisory given  Plan Discussed with: CRNA and Anesthesiologist  Anesthesia Plan Comments:         Anesthesia Quick Evaluation

## 2021-08-31 ENCOUNTER — Other Ambulatory Visit: Payer: Self-pay

## 2021-08-31 DIAGNOSIS — N186 End stage renal disease: Secondary | ICD-10-CM

## 2021-08-31 DIAGNOSIS — Z992 Dependence on renal dialysis: Secondary | ICD-10-CM

## 2021-09-01 ENCOUNTER — Emergency Department (HOSPITAL_COMMUNITY): Payer: Medicare Other

## 2021-09-01 ENCOUNTER — Other Ambulatory Visit: Payer: Self-pay

## 2021-09-01 ENCOUNTER — Inpatient Hospital Stay (HOSPITAL_COMMUNITY): Payer: Medicare Other

## 2021-09-01 ENCOUNTER — Inpatient Hospital Stay (HOSPITAL_COMMUNITY)
Admission: EM | Admit: 2021-09-01 | Discharge: 2021-09-16 | DRG: 100 | Disposition: E | Payer: Medicare Other | Attending: Pulmonary Disease | Admitting: Pulmonary Disease

## 2021-09-01 ENCOUNTER — Encounter (HOSPITAL_COMMUNITY): Payer: Self-pay

## 2021-09-01 DIAGNOSIS — Z7982 Long term (current) use of aspirin: Secondary | ICD-10-CM

## 2021-09-01 DIAGNOSIS — D696 Thrombocytopenia, unspecified: Secondary | ICD-10-CM | POA: Diagnosis not present

## 2021-09-01 DIAGNOSIS — Z20822 Contact with and (suspected) exposure to covid-19: Secondary | ICD-10-CM | POA: Diagnosis present

## 2021-09-01 DIAGNOSIS — Z8041 Family history of malignant neoplasm of ovary: Secondary | ICD-10-CM

## 2021-09-01 DIAGNOSIS — Z7984 Long term (current) use of oral hypoglycemic drugs: Secondary | ICD-10-CM

## 2021-09-01 DIAGNOSIS — E871 Hypo-osmolality and hyponatremia: Secondary | ICD-10-CM | POA: Diagnosis not present

## 2021-09-01 DIAGNOSIS — J9602 Acute respiratory failure with hypercapnia: Secondary | ICD-10-CM

## 2021-09-01 DIAGNOSIS — D631 Anemia in chronic kidney disease: Secondary | ICD-10-CM | POA: Diagnosis present

## 2021-09-01 DIAGNOSIS — E1122 Type 2 diabetes mellitus with diabetic chronic kidney disease: Secondary | ICD-10-CM | POA: Diagnosis present

## 2021-09-01 DIAGNOSIS — Z992 Dependence on renal dialysis: Secondary | ICD-10-CM | POA: Diagnosis not present

## 2021-09-01 DIAGNOSIS — J9600 Acute respiratory failure, unspecified whether with hypoxia or hypercapnia: Secondary | ICD-10-CM | POA: Diagnosis not present

## 2021-09-01 DIAGNOSIS — J150 Pneumonia due to Klebsiella pneumoniae: Secondary | ICD-10-CM | POA: Diagnosis not present

## 2021-09-01 DIAGNOSIS — J9601 Acute respiratory failure with hypoxia: Secondary | ICD-10-CM | POA: Diagnosis present

## 2021-09-01 DIAGNOSIS — Z79899 Other long term (current) drug therapy: Secondary | ICD-10-CM

## 2021-09-01 DIAGNOSIS — I120 Hypertensive chronic kidney disease with stage 5 chronic kidney disease or end stage renal disease: Secondary | ICD-10-CM | POA: Diagnosis present

## 2021-09-01 DIAGNOSIS — R64 Cachexia: Secondary | ICD-10-CM | POA: Diagnosis present

## 2021-09-01 DIAGNOSIS — J9622 Acute and chronic respiratory failure with hypercapnia: Secondary | ICD-10-CM

## 2021-09-01 DIAGNOSIS — Z6821 Body mass index (BMI) 21.0-21.9, adult: Secondary | ICD-10-CM

## 2021-09-01 DIAGNOSIS — Z9289 Personal history of other medical treatment: Secondary | ICD-10-CM

## 2021-09-01 DIAGNOSIS — R471 Dysarthria and anarthria: Secondary | ICD-10-CM | POA: Diagnosis present

## 2021-09-01 DIAGNOSIS — E43 Unspecified severe protein-calorie malnutrition: Secondary | ICD-10-CM | POA: Diagnosis present

## 2021-09-01 DIAGNOSIS — I6783 Posterior reversible encephalopathy syndrome: Secondary | ICD-10-CM | POA: Diagnosis present

## 2021-09-01 DIAGNOSIS — G40901 Epilepsy, unspecified, not intractable, with status epilepticus: Secondary | ICD-10-CM | POA: Diagnosis present

## 2021-09-01 DIAGNOSIS — Z4659 Encounter for fitting and adjustment of other gastrointestinal appliance and device: Secondary | ICD-10-CM

## 2021-09-01 DIAGNOSIS — E11649 Type 2 diabetes mellitus with hypoglycemia without coma: Secondary | ICD-10-CM | POA: Diagnosis not present

## 2021-09-01 DIAGNOSIS — Z01818 Encounter for other preprocedural examination: Secondary | ICD-10-CM

## 2021-09-01 DIAGNOSIS — E785 Hyperlipidemia, unspecified: Secondary | ICD-10-CM | POA: Diagnosis present

## 2021-09-01 DIAGNOSIS — J9621 Acute and chronic respiratory failure with hypoxia: Secondary | ICD-10-CM

## 2021-09-01 DIAGNOSIS — G9340 Encephalopathy, unspecified: Secondary | ICD-10-CM | POA: Insufficient documentation

## 2021-09-01 DIAGNOSIS — E1165 Type 2 diabetes mellitus with hyperglycemia: Secondary | ICD-10-CM | POA: Diagnosis present

## 2021-09-01 DIAGNOSIS — J9690 Respiratory failure, unspecified, unspecified whether with hypoxia or hypercapnia: Secondary | ICD-10-CM | POA: Diagnosis present

## 2021-09-01 DIAGNOSIS — N186 End stage renal disease: Secondary | ICD-10-CM

## 2021-09-01 DIAGNOSIS — H510 Palsy (spasm) of conjugate gaze: Secondary | ICD-10-CM | POA: Diagnosis present

## 2021-09-01 DIAGNOSIS — I959 Hypotension, unspecified: Secondary | ICD-10-CM | POA: Diagnosis present

## 2021-09-01 DIAGNOSIS — Z66 Do not resuscitate: Secondary | ICD-10-CM | POA: Diagnosis not present

## 2021-09-01 DIAGNOSIS — I161 Hypertensive emergency: Secondary | ICD-10-CM | POA: Diagnosis present

## 2021-09-01 DIAGNOSIS — E1100 Type 2 diabetes mellitus with hyperosmolarity without nonketotic hyperglycemic-hyperosmolar coma (NKHHC): Secondary | ICD-10-CM | POA: Diagnosis present

## 2021-09-01 DIAGNOSIS — Z87898 Personal history of other specified conditions: Secondary | ICD-10-CM

## 2021-09-01 DIAGNOSIS — E875 Hyperkalemia: Secondary | ICD-10-CM | POA: Diagnosis not present

## 2021-09-01 DIAGNOSIS — I7389 Other specified peripheral vascular diseases: Secondary | ICD-10-CM

## 2021-09-01 DIAGNOSIS — Z515 Encounter for palliative care: Secondary | ICD-10-CM

## 2021-09-01 DIAGNOSIS — Z87820 Personal history of traumatic brain injury: Secondary | ICD-10-CM

## 2021-09-01 DIAGNOSIS — M898X9 Other specified disorders of bone, unspecified site: Secondary | ICD-10-CM | POA: Diagnosis present

## 2021-09-01 DIAGNOSIS — Z87891 Personal history of nicotine dependence: Secondary | ICD-10-CM

## 2021-09-01 LAB — I-STAT CHEM 8, ED
BUN: 47 mg/dL — ABNORMAL HIGH (ref 8–23)
Calcium, Ion: 1.03 mmol/L — ABNORMAL LOW (ref 1.15–1.40)
Chloride: 98 mmol/L (ref 98–111)
Creatinine, Ser: 8.8 mg/dL — ABNORMAL HIGH (ref 0.61–1.24)
Glucose, Bld: 683 mg/dL (ref 70–99)
HCT: 42 % (ref 39.0–52.0)
Hemoglobin: 14.3 g/dL (ref 13.0–17.0)
Potassium: 5.9 mmol/L — ABNORMAL HIGH (ref 3.5–5.1)
Sodium: 129 mmol/L — ABNORMAL LOW (ref 135–145)
TCO2: 23 mmol/L (ref 22–32)

## 2021-09-01 LAB — COMPREHENSIVE METABOLIC PANEL
ALT: 17 U/L (ref 0–44)
AST: 17 U/L (ref 15–41)
Albumin: 3.9 g/dL (ref 3.5–5.0)
Alkaline Phosphatase: 98 U/L (ref 38–126)
Anion gap: 15 (ref 5–15)
BUN: 47 mg/dL — ABNORMAL HIGH (ref 8–23)
CO2: 22 mmol/L (ref 22–32)
Calcium: 9.1 mg/dL (ref 8.9–10.3)
Chloride: 92 mmol/L — ABNORMAL LOW (ref 98–111)
Creatinine, Ser: 8.4 mg/dL — ABNORMAL HIGH (ref 0.61–1.24)
GFR, Estimated: 6 mL/min — ABNORMAL LOW (ref >60)
Glucose, Bld: 675 mg/dL (ref 70–99)
Potassium: 6 mmol/L — ABNORMAL HIGH (ref 3.5–5.1)
Sodium: 129 mmol/L — ABNORMAL LOW (ref 135–145)
Total Bilirubin: 0.6 mg/dL (ref 0.3–1.2)
Total Protein: 7.8 g/dL (ref 6.5–8.1)

## 2021-09-01 LAB — BASIC METABOLIC PANEL
Anion gap: 15 (ref 5–15)
Anion gap: 15 (ref 5–15)
Anion gap: 17 — ABNORMAL HIGH (ref 5–15)
BUN: 47 mg/dL — ABNORMAL HIGH (ref 8–23)
BUN: 47 mg/dL — ABNORMAL HIGH (ref 8–23)
BUN: 51 mg/dL — ABNORMAL HIGH (ref 8–23)
CO2: 17 mmol/L — ABNORMAL LOW (ref 22–32)
CO2: 21 mmol/L — ABNORMAL LOW (ref 22–32)
CO2: 17 mmol/L — ABNORMAL LOW (ref 22–32)
Calcium: 9 mg/dL (ref 8.9–10.3)
Calcium: 9 mg/dL (ref 8.9–10.3)
Calcium: 9.3 mg/dL (ref 8.9–10.3)
Chloride: 95 mmol/L — ABNORMAL LOW (ref 98–111)
Chloride: 97 mmol/L — ABNORMAL LOW (ref 98–111)
Chloride: 99 mmol/L (ref 98–111)
Creatinine, Ser: 8.23 mg/dL — ABNORMAL HIGH (ref 0.61–1.24)
Creatinine, Ser: 8.44 mg/dL — ABNORMAL HIGH (ref 0.61–1.24)
Creatinine, Ser: 8.93 mg/dL — ABNORMAL HIGH (ref 0.61–1.24)
GFR, Estimated: 6 mL/min — ABNORMAL LOW (ref >60)
GFR, Estimated: 6 mL/min — ABNORMAL LOW (ref >60)
GFR, Estimated: 6 mL/min — ABNORMAL LOW (ref >60)
Glucose, Bld: 593 mg/dL (ref 70–99)
Glucose, Bld: 359 mg/dL — ABNORMAL HIGH (ref 70–99)
Glucose, Bld: 93 mg/dL (ref 70–99)
Potassium: 5.9 mmol/L — ABNORMAL HIGH (ref 3.5–5.1)
Potassium: 4.5 mmol/L (ref 3.5–5.1)
Potassium: 5.3 mmol/L — ABNORMAL HIGH (ref 3.5–5.1)
Sodium: 127 mmol/L — ABNORMAL LOW (ref 135–145)
Sodium: 133 mmol/L — ABNORMAL LOW (ref 135–145)
Sodium: 133 mmol/L — ABNORMAL LOW (ref 135–145)

## 2021-09-01 LAB — I-STAT VENOUS BLOOD GAS, ED
Acid-base deficit: 2 mmol/L (ref 0.0–2.0)
Bicarbonate: 23.8 mmol/L (ref 20.0–28.0)
Calcium, Ion: 1.02 mmol/L — ABNORMAL LOW (ref 1.15–1.40)
HCT: 40 % (ref 39.0–52.0)
Hemoglobin: 13.6 g/dL (ref 13.0–17.0)
O2 Saturation: 87 %
Potassium: 5.9 mmol/L — ABNORMAL HIGH (ref 3.5–5.1)
Sodium: 129 mmol/L — ABNORMAL LOW (ref 135–145)
TCO2: 25 mmol/L (ref 22–32)
pCO2, Ven: 41.6 mmHg — ABNORMAL LOW (ref 44.0–60.0)
pH, Ven: 7.365 (ref 7.250–7.430)
pO2, Ven: 54 mmHg — ABNORMAL HIGH (ref 32.0–45.0)

## 2021-09-01 LAB — I-STAT ARTERIAL BLOOD GAS, ED
Acid-Base Excess: 4 mmol/L — ABNORMAL HIGH (ref 0.0–2.0)
Bicarbonate: 24.3 mmol/L (ref 20.0–28.0)
Calcium, Ion: 1.06 mmol/L — ABNORMAL LOW (ref 1.15–1.40)
HCT: 36 % — ABNORMAL LOW (ref 39.0–52.0)
Hemoglobin: 12.2 g/dL — ABNORMAL LOW (ref 13.0–17.0)
O2 Saturation: 100 %
Potassium: 5.4 mmol/L — ABNORMAL HIGH (ref 3.5–5.1)
Sodium: 128 mmol/L — ABNORMAL LOW (ref 135–145)
TCO2: 25 mmol/L (ref 22–32)
pCO2 arterial: 23.3 mmHg — ABNORMAL LOW (ref 32.0–48.0)
pH, Arterial: 7.626 (ref 7.350–7.450)
pO2, Arterial: 524 mmHg — ABNORMAL HIGH (ref 83.0–108.0)

## 2021-09-01 LAB — DIFFERENTIAL
Abs Immature Granulocytes: 0.01 10*3/uL (ref 0.00–0.07)
Basophils Absolute: 0.1 10*3/uL (ref 0.0–0.1)
Basophils Relative: 1 %
Eosinophils Absolute: 0.2 10*3/uL (ref 0.0–0.5)
Eosinophils Relative: 4 %
Immature Granulocytes: 0 %
Lymphocytes Relative: 13 %
Lymphs Abs: 0.8 10*3/uL (ref 0.7–4.0)
Monocytes Absolute: 0.5 10*3/uL (ref 0.1–1.0)
Monocytes Relative: 9 %
Neutro Abs: 4.4 10*3/uL (ref 1.7–7.7)
Neutrophils Relative %: 73 %

## 2021-09-01 LAB — LACTIC ACID, PLASMA: Lactic Acid, Venous: 1.6 mmol/L (ref 0.5–1.9)

## 2021-09-01 LAB — POCT I-STAT 7, (LYTES, BLD GAS, ICA,H+H)
Acid-base deficit: 1 mmol/L (ref 0.0–2.0)
Bicarbonate: 20.6 mmol/L (ref 20.0–28.0)
Calcium, Ion: 1.12 mmol/L — ABNORMAL LOW (ref 1.15–1.40)
HCT: 37 % — ABNORMAL LOW (ref 39.0–52.0)
Hemoglobin: 12.6 g/dL — ABNORMAL LOW (ref 13.0–17.0)
O2 Saturation: 96 %
Patient temperature: 97.6
Potassium: 4.7 mmol/L (ref 3.5–5.1)
Sodium: 133 mmol/L — ABNORMAL LOW (ref 135–145)
TCO2: 21 mmol/L — ABNORMAL LOW (ref 22–32)
pCO2 arterial: 25.1 mmHg — ABNORMAL LOW (ref 32.0–48.0)
pH, Arterial: 7.52 — ABNORMAL HIGH (ref 7.350–7.450)
pO2, Arterial: 70 mmHg — ABNORMAL LOW (ref 83.0–108.0)

## 2021-09-01 LAB — CBC
HCT: 38.3 % — ABNORMAL LOW (ref 39.0–52.0)
HCT: 35.2 % — ABNORMAL LOW (ref 39.0–52.0)
Hemoglobin: 12.9 g/dL — ABNORMAL LOW (ref 13.0–17.0)
Hemoglobin: 12.2 g/dL — ABNORMAL LOW (ref 13.0–17.0)
MCH: 31.5 pg (ref 26.0–34.0)
MCH: 31.2 pg (ref 26.0–34.0)
MCHC: 33.7 g/dL (ref 30.0–36.0)
MCHC: 34.7 g/dL (ref 30.0–36.0)
MCV: 93.4 fL (ref 80.0–100.0)
MCV: 90 fL (ref 80.0–100.0)
Platelets: 152 K/uL (ref 150–400)
Platelets: 150 10*3/uL (ref 150–400)
RBC: 4.1 MIL/uL — ABNORMAL LOW (ref 4.22–5.81)
RBC: 3.91 MIL/uL — ABNORMAL LOW (ref 4.22–5.81)
RDW: 13.4 % (ref 11.5–15.5)
RDW: 13.4 % (ref 11.5–15.5)
WBC: 6 K/uL (ref 4.0–10.5)
WBC: 6.6 10*3/uL (ref 4.0–10.5)
nRBC: 0 % (ref 0.0–0.2)
nRBC: 0 % (ref 0.0–0.2)

## 2021-09-01 LAB — GLUCOSE, CAPILLARY
Glucose-Capillary: 436 mg/dL — ABNORMAL HIGH (ref 70–99)
Glucose-Capillary: 335 mg/dL — ABNORMAL HIGH (ref 70–99)
Glucose-Capillary: 236 mg/dL — ABNORMAL HIGH (ref 70–99)
Glucose-Capillary: 150 mg/dL — ABNORMAL HIGH (ref 70–99)
Glucose-Capillary: 117 mg/dL — ABNORMAL HIGH (ref 70–99)
Glucose-Capillary: 116 mg/dL — ABNORMAL HIGH (ref 70–99)
Glucose-Capillary: 95 mg/dL (ref 70–99)
Glucose-Capillary: 99 mg/dL (ref 70–99)

## 2021-09-01 LAB — PHENYTOIN LEVEL, TOTAL
Phenytoin Lvl: <2.5 ug/mL — ABNORMAL LOW (ref 10.0–20.0)
Phenytoin Lvl: 13.7 ug/mL (ref 10.0–20.0)

## 2021-09-01 LAB — PROTIME-INR
INR: 1.1 (ref 0.8–1.2)
Prothrombin Time: 14.6 s (ref 11.4–15.2)

## 2021-09-01 LAB — RESP PANEL BY RT-PCR (FLU A&B, COVID) ARPGX2
Influenza A by PCR: NEGATIVE
Influenza B by PCR: NEGATIVE
SARS Coronavirus 2 by RT PCR: NEGATIVE

## 2021-09-01 LAB — BETA-HYDROXYBUTYRIC ACID: Beta-Hydroxybutyric Acid: 0.86 mmol/L — ABNORMAL HIGH (ref 0.05–0.27)

## 2021-09-01 LAB — CREATININE, SERUM
Creatinine, Ser: 8.48 mg/dL — ABNORMAL HIGH (ref 0.61–1.24)
GFR, Estimated: 6 mL/min — ABNORMAL LOW (ref >60)

## 2021-09-01 LAB — CBG MONITORING, ED
Glucose-Capillary: >600 mg/dL (ref 70–99)
Glucose-Capillary: 580 mg/dL (ref 70–99)

## 2021-09-01 LAB — OSMOLALITY: Osmolality: 325 mOsm/kg (ref 275–295)

## 2021-09-01 LAB — APTT: aPTT: 32 s (ref 24–36)

## 2021-09-01 IMAGING — CT CT HEAD CODE STROKE
3 of 4 series · 14 of 47 positions shown, 16 images · non-contrast
Comparison: Brain MRI [DATE], CT head [DATE]

CLINICAL DATA: Code stroke.  Neuro deficit, acute stroke suspected

EXAM:
CT HEAD WITHOUT CONTRAST
TECHNIQUE: Contiguous axial images were obtained from the base of the skull
through the vertex without intravenous contrast.

[Series 2: head 5.0 st · axial · 0.44mm/px · z∈[-96,+44]mm · 8 of 34 slices shown, 10 images]
[im 3/34  brain]
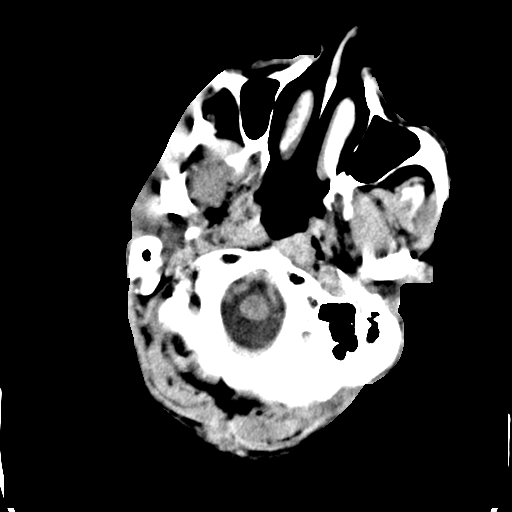
[im 3/34  bone]
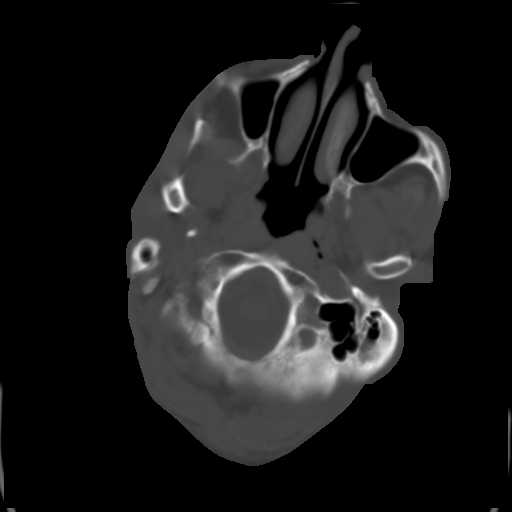
[im 8/34  brain]
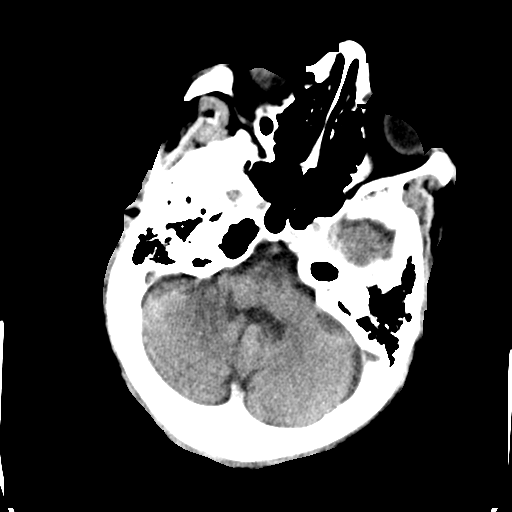
[im 12/34  brain]
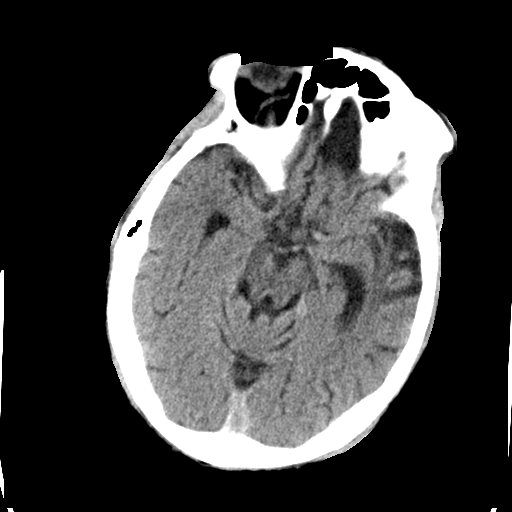
[im 15/34  brain]
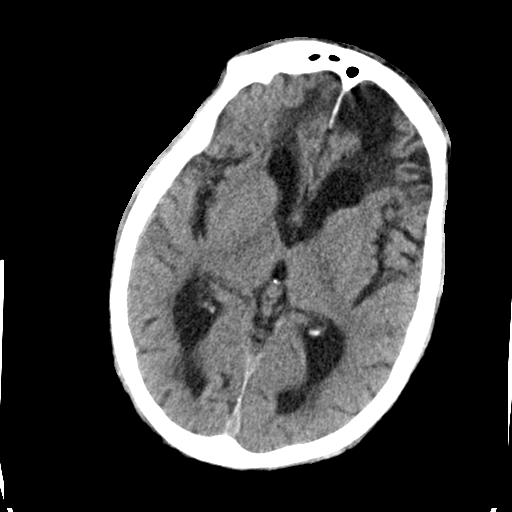
[im 19/34  brain]
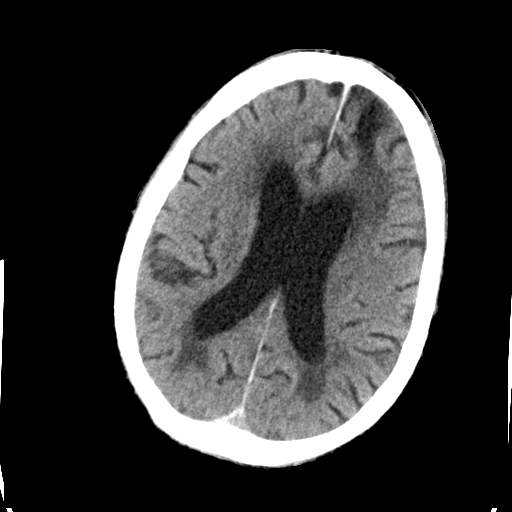
[im 19/34  bone]
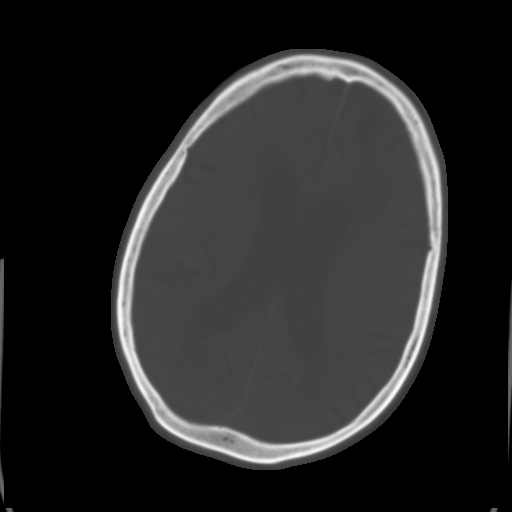
[im 22/34  brain]
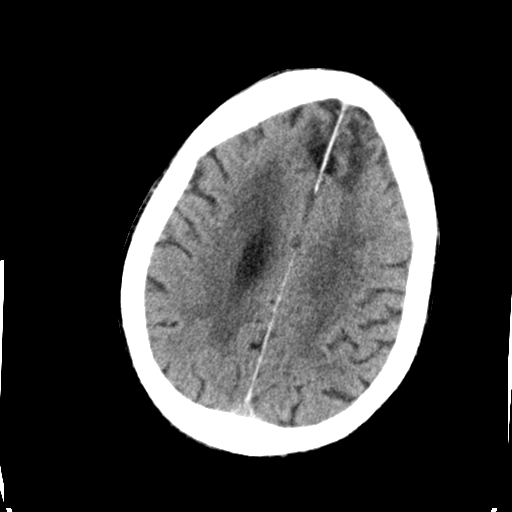
[im 26/34  brain]
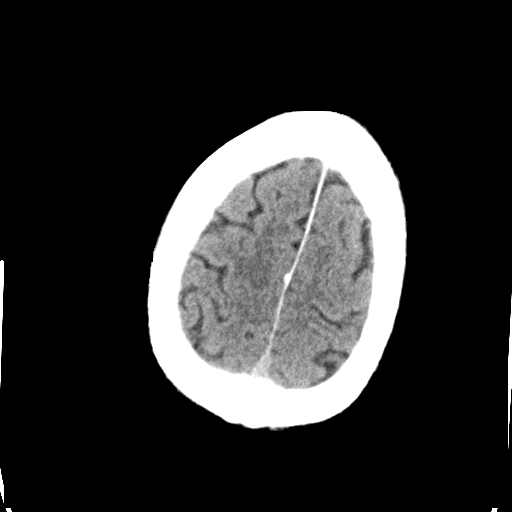
[im 31/34  brain]
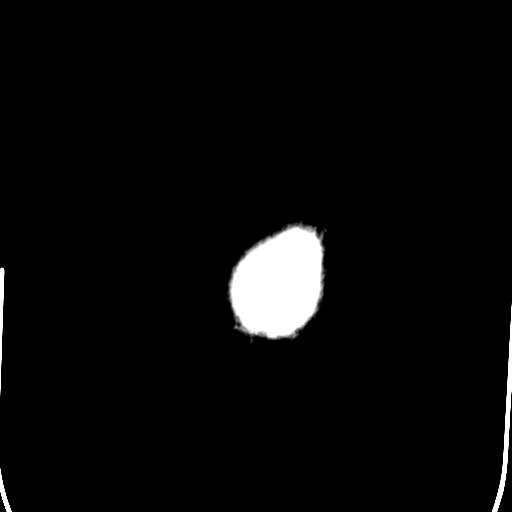

[Series 5: head 3.0 cor st · coronal · 0.33mm/px · 3 of 70 slices shown]
[im 24/70  brain]
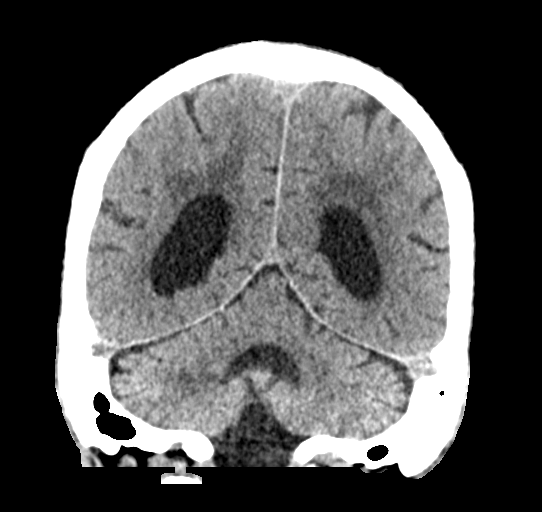
[im 31/70  brain]
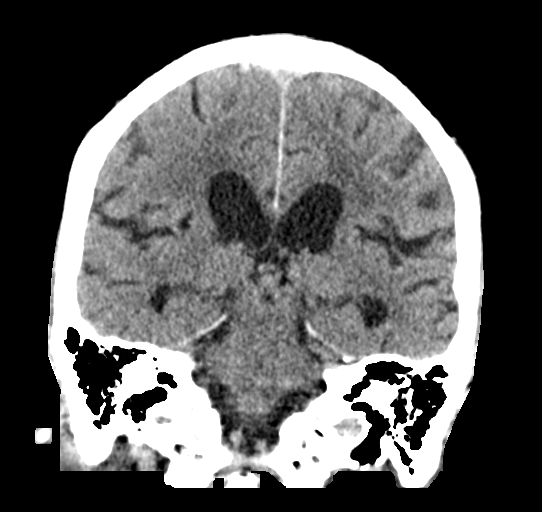
[im 39/70  brain]
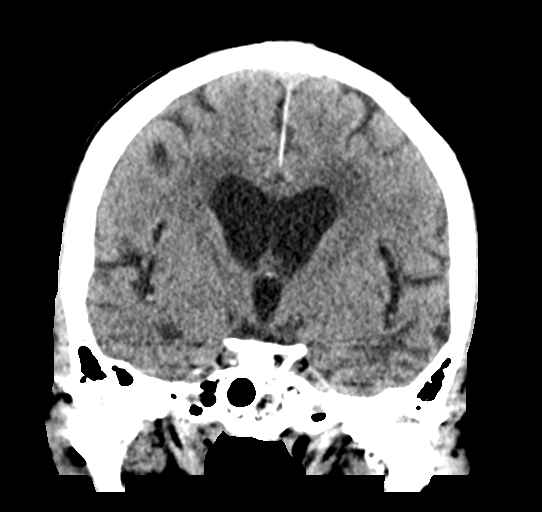

[Series 6: head 3.0 sag st · sagittal · 0.33mm/px · 3 of 57 slices shown]
[im 19/57  brain]
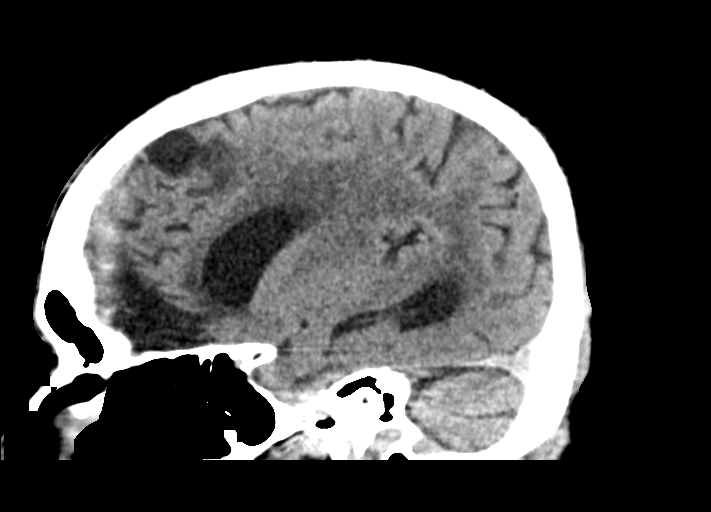
[im 29/57  brain]
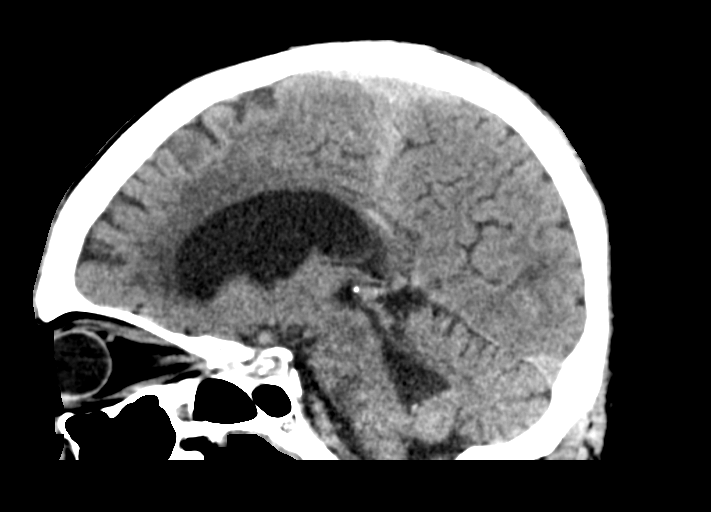
[im 38/57  brain]
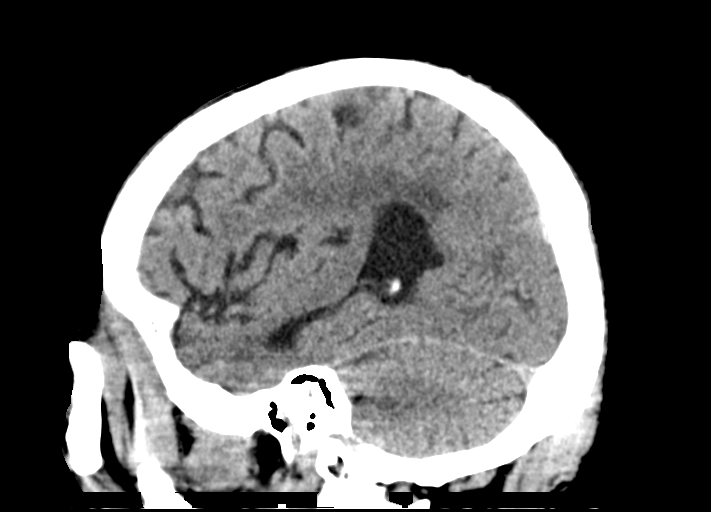

[14 of 47 positions shown; findings below may reference images not displayed]

FINDINGS: Brain: There is no evidence of acute intracranial hemorrhage,
extra-axial fluid collection, or acute infarct.

There is unchanged encephalomalacia in the left frontal and temporal
lobes related to prior insult

There is unchanged mild parenchymal volume loss with enlargement of
the ventricular system. The ventricles are stable in size. Confluent
hypodensity in the subcortical and periventricular white matter is
unchanged, likely reflecting sequela of advanced chronic white
matter microangiopathy.

There is no solid mass lesion.  There is no midline shift.

Vascular: There is calcification of the bilateral cavernous ICAs.

Skull: Normal. Negative for fracture or focal lesion.

Sinuses/Orbits: The paranasal sinuses are clear. Bilateral lens
implants are in place. The globes and orbits are otherwise
unremarkable.

Other: None.

ASPECTS (Alberta Stroke Program Early CT Score)

- Ganglionic level infarction (caudate, lentiform nuclei, internal
capsule, insula, M1-M3 cortex): 7

- Supraganglionic infarction (M4-M6 cortex): 3

Total score (0-10 with 10 being normal): 10
IMPRESSION: 1. No acute intracranial hemorrhage or infarct.
2. ASPECTS is 10.
3. Unchanged encephalomalacia in the left frontal and temporal lobes
related to prior insult.

These results were paged via AMION at the time of interpretation on
[DATE] at [DATE] to provider Dr AMAIZU ts.

## 2021-09-01 IMAGING — DX DG CHEST 1V PORT
1 series · 1 of 1 positions shown · non-contrast
Comparison: Chest x-ray dated [DATE].

CLINICAL DATA: Endotracheal tube placement.

EXAM:
PORTABLE CHEST 1 VIEW

[chest ap]
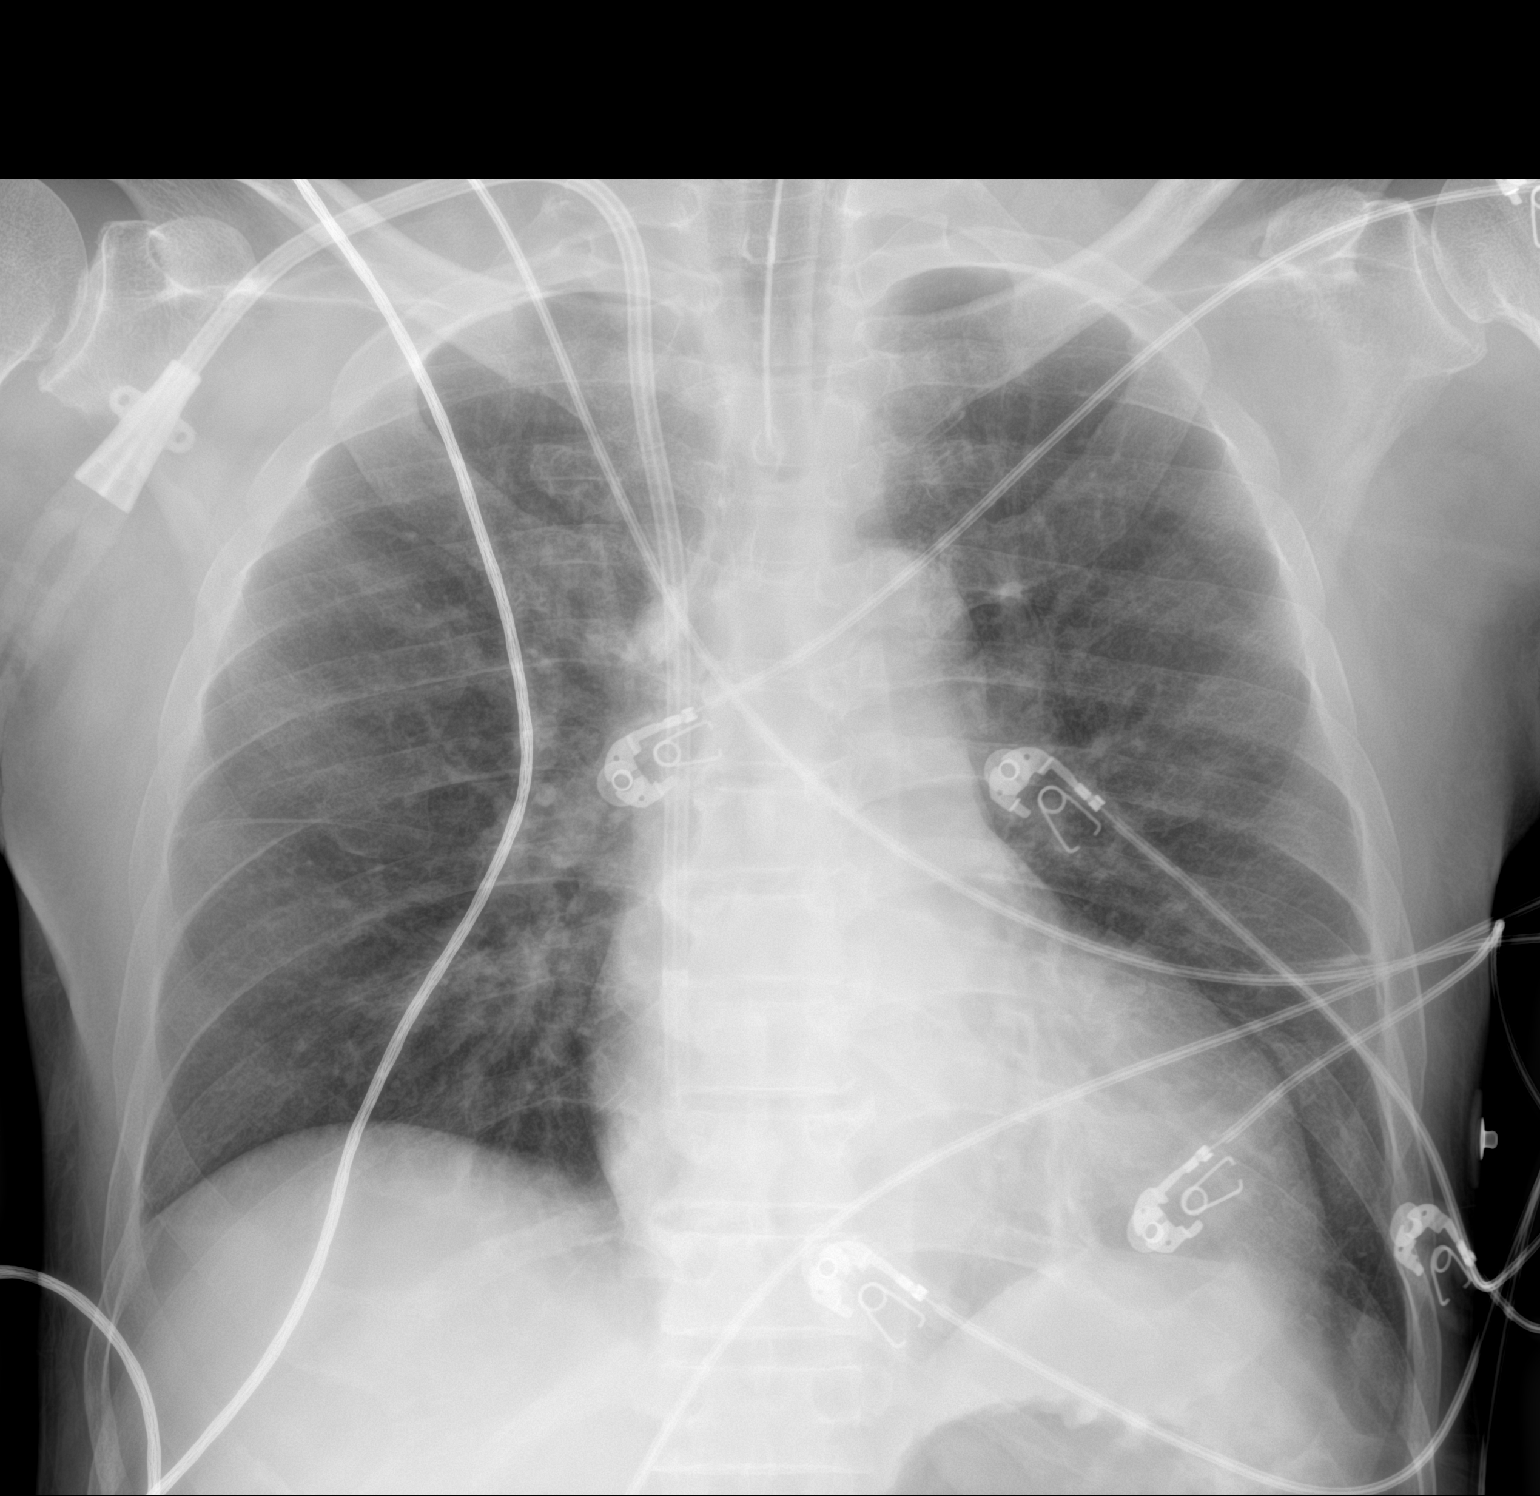

[1 of 1 positions shown; findings below may reference images not displayed]

FINDINGS: Endotracheal tube tip in good position 3.9 cm above the carina.
Unchanged tunneled right internal jugular dialysis catheter. The
heart size and mediastinal contours are within normal limits. Normal
pulmonary vascularity. Mild left lower lobe atelectasis. No focal
consolidation, pleural effusion, or pneumothorax. No acute osseous
abnormality.
IMPRESSION: 1. Appropriately positioned endotracheal tube. No active disease.

## 2021-09-01 MED ORDER — PROPOFOL 1000 MG/100ML IV EMUL
5.0000 ug/kg/min | INTRAVENOUS | Status: DC
  Administered 2021-09-01: 5 ug/kg/min via INTRAVENOUS
  Administered 2021-09-01: 50 ug/kg/min via INTRAVENOUS
  Administered 2021-09-02: 30 ug/kg/min via INTRAVENOUS
  Administered 2021-09-02: 07:00:00 35 ug/kg/min via INTRAVENOUS
  Filled 2021-09-01 (×3): qty 100

## 2021-09-01 MED ORDER — ROCURONIUM BROMIDE 10 MG/ML (PF) SYRINGE
100.0000 mg | PREFILLED_SYRINGE | Freq: Once | INTRAVENOUS | Status: AC
  Administered 2021-09-01: 100 mg via INTRAVENOUS

## 2021-09-01 MED ORDER — DILTIAZEM HCL ER 90 MG PO CP12: 90.0000 mg | ORAL_CAPSULE | Freq: Two times a day (BID) | ORAL | Status: DC

## 2021-09-01 MED ORDER — ETOMIDATE 2 MG/ML IV SOLN
20.0000 mg | Freq: Once | INTRAVENOUS | Status: AC
  Administered 2021-09-01: 20 mg via INTRAVENOUS

## 2021-09-01 MED ORDER — LABETALOL HCL 5 MG/ML IV SOLN
0.0000 mg | INTRAVENOUS | Status: DC | PRN
  Administered 2021-09-01 – 2021-09-03 (×2): 10 mg via INTRAVENOUS
  Administered 2021-09-03: 20 mg via INTRAVENOUS
  Administered 2021-09-03 (×2): 10 mg via INTRAVENOUS
  Administered 2021-09-04 – 2021-09-09 (×3): 20 mg via INTRAVENOUS
  Filled 2021-09-01 (×9): qty 4

## 2021-09-01 MED ORDER — PROPOFOL 1000 MG/100ML IV EMUL
INTRAVENOUS | Status: AC
  Administered 2021-09-01: 5 ug
  Filled 2021-09-01: qty 100

## 2021-09-01 MED ORDER — HEPARIN SODIUM (PORCINE) 5000 UNIT/ML IJ SOLN
5000.0000 [IU] | Freq: Three times a day (TID) | INTRAMUSCULAR | Status: DC
  Administered 2021-09-01 – 2021-09-10 (×28): 5000 [IU] via SUBCUTANEOUS
  Filled 2021-09-01 (×28): qty 1

## 2021-09-01 MED ORDER — INSULIN REGULAR(HUMAN) IN NACL 100-0.9 UT/100ML-% IV SOLN
INTRAVENOUS | Status: DC
  Administered 2021-09-01: 6 [IU]/h via INTRAVENOUS
  Filled 2021-09-01 (×2): qty 100

## 2021-09-01 MED ORDER — LORAZEPAM 2 MG/ML IJ SOLN
2.0000 mg | Freq: Once | INTRAMUSCULAR | Status: AC
  Administered 2021-09-01: 2 mg via INTRAVENOUS

## 2021-09-01 MED ORDER — HYDRALAZINE HCL 20 MG/ML IJ SOLN
10.0000 mg | INTRAMUSCULAR | Status: DC | PRN
Start: 2021-09-01 — End: 2021-09-11
  Administered 2021-09-03 – 2021-09-09 (×4): 20 mg via INTRAVENOUS
  Filled 2021-09-01 (×5): qty 1

## 2021-09-01 MED ORDER — LEVETIRACETAM IN NACL 1000 MG/100ML IV SOLN
1000.0000 mg | Freq: Once | INTRAVENOUS | Status: AC
Start: 2021-09-01 — End: 2021-09-01
  Administered 2021-09-01: 1000 mg via INTRAVENOUS
  Filled 2021-09-01: qty 100

## 2021-09-01 MED ORDER — DEXTROSE IN LACTATED RINGERS 5 % IV SOLN: INTRAVENOUS | Status: DC

## 2021-09-01 MED ORDER — LORAZEPAM 2 MG/ML IJ SOLN: 2.0000 mg | Freq: Once | INTRAMUSCULAR | Status: AC

## 2021-09-01 MED ORDER — MIDAZOLAM HCL 2 MG/2ML IJ SOLN
1.0000 mg | INTRAMUSCULAR | Status: DC | PRN
  Administered 2021-09-05 – 2021-09-10 (×8): 1 mg via INTRAVENOUS
  Filled 2021-09-01 (×10): qty 2

## 2021-09-01 MED ORDER — POLYETHYLENE GLYCOL 3350 17 G PO PACK
17.0000 g | PACK | Freq: Every day | ORAL | Status: DC
  Administered 2021-09-02 – 2021-09-11 (×9): 17 g
  Filled 2021-09-01 (×9): qty 1

## 2021-09-01 MED ORDER — INSULIN ASPART 100 UNIT/ML IJ SOLN
5.0000 [IU] | Freq: Once | INTRAMUSCULAR | Status: AC
  Administered 2021-09-01: 5 [IU] via INTRAVENOUS

## 2021-09-01 MED ORDER — ATORVASTATIN CALCIUM 40 MG PO TABS
40.0000 mg | ORAL_TABLET | Freq: Every day | ORAL | Status: DC
  Administered 2021-09-01 – 2021-09-11 (×11): 40 mg
  Filled 2021-09-01 (×11): qty 1

## 2021-09-01 MED ORDER — DOCUSATE SODIUM 50 MG/5ML PO LIQD
100.0000 mg | Freq: Two times a day (BID) | ORAL | Status: DC
  Administered 2021-09-01 – 2021-09-11 (×17): 100 mg
  Filled 2021-09-01 (×19): qty 10

## 2021-09-01 MED ORDER — IOHEXOL 350 MG/ML SOLN
100.0000 mL | Freq: Once | INTRAVENOUS | Status: AC | PRN
  Administered 2021-09-01: 100 mL via INTRAVENOUS

## 2021-09-01 MED ORDER — LACTATED RINGERS IV SOLN: INTRAVENOUS | Status: DC

## 2021-09-01 MED ORDER — HYDRALAZINE HCL 25 MG PO TABS: 25.0000 mg | ORAL_TABLET | Freq: Three times a day (TID) | ORAL | Status: DC

## 2021-09-01 MED ORDER — SODIUM CHLORIDE 0.9 % IV SOLN
20.0000 mg/kg | Freq: Once | INTRAVENOUS | Status: AC
  Administered 2021-09-01: 1352 mg via INTRAVENOUS
  Filled 2021-09-01: qty 27.04

## 2021-09-01 MED ORDER — SODIUM CHLORIDE 0.9% FLUSH
3.0000 mL | Freq: Once | INTRAVENOUS | Status: AC
Start: 2021-09-01 — End: 2021-09-01
  Administered 2021-09-01: 3 mL via INTRAVENOUS

## 2021-09-01 MED ORDER — LORAZEPAM 2 MG/ML IJ SOLN
INTRAMUSCULAR | Status: AC
  Administered 2021-09-01: 2 mg via INTRAVENOUS
  Filled 2021-09-01: qty 1

## 2021-09-01 MED ORDER — PHENYTOIN SODIUM 50 MG/ML IJ SOLN
100.0000 mg | Freq: Three times a day (TID) | INTRAMUSCULAR | Status: DC
Start: 2021-09-01 — End: 2021-09-03
  Administered 2021-09-01 – 2021-09-03 (×5): 100 mg via INTRAVENOUS
  Filled 2021-09-01 (×6): qty 2

## 2021-09-01 MED ORDER — PROPOFOL 1000 MG/100ML IV EMUL: 0.0000 ug/kg/min | INTRAVENOUS | Status: DC

## 2021-09-01 MED ORDER — DILTIAZEM HCL 90 MG PO TABS
45.0000 mg | ORAL_TABLET | Freq: Four times a day (QID) | ORAL | Status: DC
  Administered 2021-09-01 – 2021-09-04 (×10): 45 mg
  Filled 2021-09-01 (×13): qty 0.5

## 2021-09-01 MED ORDER — CHLORHEXIDINE GLUCONATE 0.12% ORAL RINSE (MEDLINE KIT)
15.0000 mL | Freq: Two times a day (BID) | OROMUCOSAL | Status: DC
  Administered 2021-09-01 – 2021-09-11 (×19): 15 mL via OROMUCOSAL

## 2021-09-01 MED ORDER — DEXTROSE 5 % IV SOLN
INTRAVENOUS | Status: DC
Start: 2021-09-01 — End: 2021-09-01

## 2021-09-01 MED ORDER — LACTATED RINGERS IV BOLUS: 1000.0000 mL | INTRAVENOUS | Status: DC

## 2021-09-01 MED ORDER — ASPIRIN 81 MG PO CHEW
81.0000 mg | CHEWABLE_TABLET | Freq: Every day | ORAL | Status: DC
  Administered 2021-09-01 – 2021-09-11 (×11): 81 mg
  Filled 2021-09-01 (×11): qty 1

## 2021-09-01 MED ORDER — DEXTROSE 50 % IV SOLN
0.0000 mL | INTRAVENOUS | Status: DC | PRN
  Administered 2021-09-10: 50 mL via INTRAVENOUS
  Filled 2021-09-01: qty 50

## 2021-09-01 MED ORDER — SODIUM ZIRCONIUM CYCLOSILICATE 10 G PO PACK: 10.0000 g | PACK | Freq: Once | ORAL | Status: DC

## 2021-09-01 MED ORDER — SODIUM CHLORIDE 0.9% FLUSH: 10.0000 mL | INTRAVENOUS | Status: DC | PRN

## 2021-09-01 MED ORDER — INSULIN ASPART 100 UNIT/ML IJ SOLN
1.0000 [IU] | INTRAMUSCULAR | Status: DC
Start: 2021-09-01 — End: 2021-09-02
  Administered 2021-09-02: 07:00:00 3 [IU] via SUBCUTANEOUS

## 2021-09-01 MED ORDER — PANTOPRAZOLE SODIUM 40 MG IV SOLR
40.0000 mg | Freq: Every day | INTRAVENOUS | Status: DC
  Administered 2021-09-01 – 2021-09-11 (×11): 40 mg via INTRAVENOUS
  Filled 2021-09-01 (×11): qty 40

## 2021-09-01 MED ORDER — HYDRALAZINE HCL 25 MG PO TABS
25.0000 mg | ORAL_TABLET | Freq: Three times a day (TID) | ORAL | Status: DC
  Administered 2021-09-01 – 2021-09-03 (×4): 25 mg
  Filled 2021-09-01 (×4): qty 1

## 2021-09-01 MED ORDER — HEPARIN SODIUM (PORCINE) 1000 UNIT/ML DIALYSIS
2000.0000 [IU] | Freq: Once | INTRAMUSCULAR | Status: AC
  Administered 2021-09-02: 2000 [IU] via INTRAVENOUS_CENTRAL
  Filled 2021-09-01: qty 2

## 2021-09-01 MED ORDER — CHLORHEXIDINE GLUCONATE CLOTH 2 % EX PADS
6.0000 | MEDICATED_PAD | Freq: Every day | CUTANEOUS | Status: DC
  Administered 2021-09-01 – 2021-09-11 (×8): 6 via TOPICAL

## 2021-09-01 MED ORDER — INSULIN DETEMIR 100 UNIT/ML ~~LOC~~ SOLN
5.0000 [IU] | Freq: Two times a day (BID) | SUBCUTANEOUS | Status: DC
  Administered 2021-09-01: 5 [IU] via SUBCUTANEOUS
  Filled 2021-09-01 (×3): qty 0.05

## 2021-09-01 MED ORDER — MIDAZOLAM HCL 2 MG/2ML IJ SOLN
1.0000 mg | INTRAMUSCULAR | Status: AC | PRN
  Administered 2021-09-04 (×3): 1 mg via INTRAVENOUS
  Filled 2021-09-01 (×2): qty 2

## 2021-09-01 MED ORDER — ORAL CARE MOUTH RINSE
15.0000 mL | OROMUCOSAL | Status: DC
  Administered 2021-09-01 – 2021-09-11 (×98): 15 mL via OROMUCOSAL

## 2021-09-01 NOTE — Consult Note (Signed)
NAME:  Jonathan Kane, MRN:  [id number], DOB:  Dec 26, 1945, LOS: 0 ADMISSION DATE:  08/27/2021, CONSULTATION DATE:  08/22/2021 REFERRING MD:  Dr. Matilde Sprang, CHIEF COMPLAINT:  Seizures  History of Present Illness:   Pt is encephalopathic; therefore, this HPI is obtained from chart review and speaking with pt's son at bedside.  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    Recent hospitalization 10/22- 10/27 for focal seizure of RLE, started on dilantin and keppra; he had no prior history of seizures prior to this.   Last at his baseline on 11/15 around 2000.  Found in [place] 11/16 with left gaze, left facial twitching, and not responding.  EMS found patient with same as well as mouth smacking, intermittent UE jerking, RUE flexed, and intermittent right foot twitching.  He received ativan 2mg  and presented to [facility] as a code stroke.    On arrival, he was afebrile and hypertensive with BP 234/ 127, with ongoing left gaze.  He was given additional ativan and loaded with keppra.  CT head non acute but showed left frontal and temporal lobe unchanged encephalomalacia.  CTA head and neck negative for LVO.  Labs noted for na 129 (corrected 138), K 6, Cl 92, glucose 675, sCr 8.4, Hgb 12.9, and low dilantin level at < 2.5.  He required intubation for airway protection given decreased mental status and sonorous respirations.   Was placed on propofol and EEG ordered.  PCCM called for admit.   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 No obvious seizure activity. Remains hypertensive with SBP 230 - 250. Son at bedside.  Objective   Blood pressure (!) 257/139, pulse (!) 117, temperature (!) 97.1 F (36.2 C), temperature source Axillary, resp. rate (!) 24, height 5\' 10"  (1.778 m), weight 67.6 kg, SpO2 100 %.        Intake/Output Summary (Last 24 hours) at 09/06/2021 1333 Last data filed at 08/25/2021 1311 Gross per 24 hour  Intake 81.58 ml  Output --  Net 81.58 ml   Filed Weights   09/12/2021 1200  Weight: 67.6 kg    Examination: General: Adult male, sedated, critically ill. Neuro: Sedated, not responsive. HEENT: [place]/AT. Sclerae anicteric. ETT in place. Cardiovascular: Tachy, regular, no M/R/G.  Lungs: Respirations even and unlabored.  Coarse bilaterally R > L. Abdomen: BS x 4, soft, NT/ND.  Musculoskeletal: No gross deformities, no edema.  Skin: Intact, warm, no rashes.   Assessment & Plan:   Status Epilepticus - unclear etiology though suspect some component of medication non-compliance given low Dilantin levels on admit. - Neuro following, appreciate the assistance. - Continue EEG monitoring. - AED's per neuro. - Will need to avoid driving following discharge (relayed with son who is concerned about this).  Respiratory insufficiency - s/p intubation.  2/2 above. - Full vent support. - Hold weaning attempts until we are certain that seizures have resolved. - Bronchial hygiene. - Follow CXR.  Hypertensive emergency. - Start with PRN Labetalol and Hydralazine for  goal SBP ~ 170. - If no improvement, will move to continuous infusion. - Hold home antihypertensives.  Hx DM, now with probable HHS. - Insulin infusion per protocol. - Check Beta Hydroxybutyric Acid. - Hold home antihyperglycemics.  Hx ESRD on iHD MWF (last session Mon 11/14). Mild hyperkalemia - s/p insulin bolus and will give 1 dose Lokelma now. - Dr. Lamonte Sakai to  notify nephrology of admission, no urgent needs for HD right now. - Follow BMP.   Best Practice (right click and "Reselect all SmartList Selections" daily)   Diet/type: NPO DVT prophylaxis: prophylactic heparin  GI prophylaxis: PPI Lines: N/A Foley:  N/A Code Status:  full code Last date of multidisciplinary goals of care discussion: None yet  Labs   CBC: Recent Labs  Lab 09/13/2021 1201 08/25/2021 1208 09/12/2021 1210  WBC 6.0  --   --   NEUTROABS 4.4  --   --   HGB 12.9* 14.3 13.6  HCT 38.3* 42.0 40.0  MCV 93.4  --   --   PLT 152  --   --     Basic Metabolic Panel: Recent Labs  Lab 09/15/2021 1201 09/05/2021 1208 08/29/2021 1210  NA 129* 129* 129*  K 6.0* 5.9* 5.9*  CL 92* 98  --   CO2 22  --   --   GLUCOSE 675* 683*  --   BUN 47* 47*  --   CREATININE 8.40* 8.80*  --   CALCIUM 9.1  --   --    GFR: Estimated Creatinine Clearance: 7.1 mL/min (A) (by C-G formula based on SCr of 8.8 mg/dL (H)). Recent Labs  Lab 08/31/2021 1201  WBC 6.0    Liver Function Tests: Recent Labs  Lab 08/18/2021 1201  AST 17  ALT 17  ALKPHOS 98  BILITOT 0.6  PROT 7.8  ALBUMIN 3.9   No results for input(s): LIPASE, AMYLASE in the last 168 hours. No results for input(s): AMMONIA in the last 168 hours.  ABG    Component Value Date/Time   PHART 7.394 05/28/2021 1318   PCO2ART 38.7 05/28/2021 1318   PO2ART 428 (H) 05/28/2021 1318   HCO3 23.8 08/25/2021 1210   TCO2 25 09/08/2021 1210   ACIDBASEDEF 2.0 08/31/2021 1210   O2SAT 87.0 09/13/2021 1210     Coagulation Profile: Recent Labs  Lab 08/17/2021 1201  INR 1.1    Cardiac Enzymes: No results for input(s): CKTOTAL, CKMB, CKMBINDEX, TROPONINI in the last 168 hours.  HbA1C: Hgb A1c MFr Bld  Date/Time Value Ref Range Status  08/08/2021 02:25 AM 10.6 (H) 4.8 - 5.6 % Final    Comment:    (NOTE) Pre diabetes:          5.7%-6.4%  Diabetes:              >6.4%  Glycemic control for   <7.0% adults with diabetes   06/01/2021  03:14 AM 11.5 (H) 4.8 - 5.6 % Final    Comment:    (NOTE) Pre diabetes:          5.7%-6.4%  Diabetes:              >6.4%  Glycemic control for   <7.0% adults with diabetes     CBG: Recent Labs  Lab 08/25/21 1350 09/10/2021 1202  GLUCAP 147* >600*    Review of Systems:   Unable to obtain as pt is encephalopathic.  Past Medical History:  He,  has a past medical history of Diabetes mellitus without complication ([place]),  End stage kidney disease ([place]), Erectile dysfunction, Hypertension, and Vitamin D deficiency.   Surgical History:   Past Surgical History:  Procedure Laterality Date   AV FISTULA PLACEMENT Left 08/25/2021   Procedure: LEFT BRACHIOCEPHALIC ARTERIOVENOUS (AV) FISTULA CREATION;  Surgeon: Marty Heck, MD;  Location: [place];  Service: Vascular;  Laterality: Left;   COLONOSCOPY     IR DIALY SHUNT INTRO NEEDLE/INTRACATH INITIAL W/IMG LEFT Left 06/25/2020   IR REMOVAL TUN CV CATH W/O FL  06/03/2021   keloid removed     TEE WITHOUT CARDIOVERSION N/A 06/04/2021   Procedure: TRANSESOPHAGEAL ECHOCARDIOGRAM (TEE);  Surgeon: Skeet Latch, MD;  Location: [place];  Service: Cardiovascular;  Laterality: N/A;     Social History:   reports that he has quit smoking. He has never used smokeless tobacco. He reports current alcohol use. He reports that he does not use drugs.   Family History:  His family history includes Other in his father; Ovarian cancer in his mother.   Allergies Allergies  Allergen Reactions   Dulaglutide     TRULICITY:  GI Upset intolerance     Home Medications  Prior to Admission medications   Medication Sig Start Date End Date Taking? Authorizing Provider  acetaminophen (TYLENOL) 325 MG tablet Take 2 tablets (650 mg total) by mouth every 6 (six) hours as needed for fever, mild pain or headache. 06/08/21  Yes Jennye Boroughs, MD  aspirin EC 81 MG tablet Take 81 mg by mouth daily.    [provider]  atorvastatin (LIPITOR) 40 MG  tablet Take 40 mg by mouth at bedtime. 05/20/20   [provider]  cholecalciferol (VITAMIN D3) 25 MCG (1000 UNIT) tablet Take 1,000 Units by mouth daily.    [provider]  diltiazem (TIAZAC) 180 MG 24 hr capsule Take 180 mg by mouth daily. 08/04/20   [provider]  glipiZIDE (GLUCOTROL) 10 MG tablet Take 10 mg by mouth daily.    [provider]  hydrALAZINE (APRESOLINE) 25 MG tablet Take 1 tablet (25 mg total) by mouth every 8 (eight) hours. 08/12/21 September 22, 2021  Bonnielee Haff, MD  HYDROcodone-acetaminophen (NORCO) 5-325 MG tablet Take 1 tablet by mouth every 6 (six) hours as needed for moderate pain. 08/25/21   Dagoberto Ligas, PA-C  latanoprost (XALATAN) 0.005 % ophthalmic solution Place 1 drop into both eyes at bedtime. 05/24/19   [provider]  levETIRAcetam (KEPPRA) 500 MG tablet Take 1 tablet (500 mg total) by mouth every Monday, Wednesday, and Friday at 6 PM. Take after hemodialysis on dialysis days. This is in addition to the other prescription. 08/13/21 10/12/21  Bonnielee Haff, MD  levETIRAcetam (KEPPRA) 750 MG tablet Take 2 tablets (1,500 mg total) by mouth 2 (two) times daily. 08/12/21 10/11/21  Bonnielee Haff, MD  lidocaine-prilocaine (EMLA) cream Apply 1 application topically 3 (three) times a week. 07/19/21   [provider]  phenytoin (DILANTIN) 50 MG tablet Chew 1.5 tablets (75 mg total) by mouth 3 (three) times daily. 08/12/21 10/11/21  Bonnielee Haff, MD  sevelamer carbonate (RENVELA) 800 MG tablet Take 800-2,400 mg by mouth See admin instructions. Take 3 tablets (2400 mg) by mouth with meals and 1 tablet (800 mg) with snacks 07/17/20   [provider]  sildenafil (REVATIO) 20 MG tablet Take 40-60 mg by mouth daily as needed (ED).  10/10/19   [provider]  sodium bicarbonate 650 MG tablet Take 1 tablet (650 mg total) by mouth 2 (two) times daily. 11/08/19  Santos-Sanchez, Merlene Morse, MD  TRADJENTA 5 MG TABS  tablet Take 5 mg by mouth daily. 06/16/20   [provider]  TRESIBA FLEXTOUCH 100 UNIT/ML FlexTouch Pen Inject 22 Units into the skin daily. 06/08/21   Jennye Boroughs, MD     Critical care time: 50 min.    Montey Hora, [facility] For pager details, please see AMION or use Epic chat  After 1900, please call [facility] for cross coverage needs 09/09/2021, 2:14 PM

## 2021-09-01 NOTE — ED Notes (Signed)
Please call the daug. With an update [id number] [phone]

## 2021-09-01 NOTE — Progress Notes (Signed)
STAT EEG complete - results pending. ? ?

## 2021-09-01 NOTE — ED Notes (Signed)
RT is at bedside doing an arterial stick  report was just called to [facility], will transport pt upstairs after RT is finished.

## 2021-09-01 NOTE — Procedures (Signed)
Patient Name: Jonathan Kane  MRN: [id number]  Epilepsy Attending: Lora Havens  Referring Physician/Provider: Dr Henrine Screws Date; 08/22/2021 Duration: 22.33 mins  Patient history:  75 y.o. 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 Krista Blue at [facility] presented to ED for forced left gaze and left facial twitching, mouth smacking, also has intermittent left UE jerking. RUE flexed with tone and intermittent right foot twitching. EEG to evaluate for seizure.   Level of alertness:  comatose  AEDs during EEG study: PHT, LEV, propofol  Technical aspects: This EEG study was done with scalp electrodes positioned according to the 10-20 International system of electrode placement. Electrical activity was acquired at a sampling rate of 500Hz  and reviewed with a high frequency filter of 70Hz  and a low frequency filter of 1Hz . EEG data were recorded continuously and digitally stored.   Description: EEG showed burst suppression pattern with bursts of sharply contoured 9-10hz  alpha activity lasting 3-4 seconds alternating with generalized eeg suppression lasting 3-6 seconds. Hyperventilation and photic stimulation were not performed.     ABNORMALITY - Burst suppression, generalized  IMPRESSION: This study is suggestive of profound diffuse encephalopathy, nonspecific etiology but likely related to sedation. No seizures or epileptiform discharges were seen throughout the recording.  [name]

## 2021-09-01 NOTE — Consult Note (Addendum)
Renal Service Consult Note [facility]  Jonathan Kane 08/27/2021 Sol Blazing, MD Requesting Physician: Dr. Lamonte Sakai   Reason for Consult: ESRD pt w/ seizures HPI: The patient is a 75 y.o. year-old w/ hx of DM2, ESRD on HD, seizure d/o brought to ED for seizure activity today 09/04/2021. Family visited pt at home (pt lives alone) and could see through the window pt unresponsive. They called 911. EMS found pt unable to respond, fixed L gaze, seizure like activity. Pt brought to ED. Pt rec'd IV ativan 2 mg and keppra load 2 gm. CTA head and neck w/o LVO. CTP showed R hemisphere firing. Glu 683, pH 7.36. pt was given insulin. Intubated for airway protection, low dose propofol was started and EEG ordered stat.  Asked to see for ESRD.    Pt seen in ICU. MWF HD pt, last HD was Monday, missed HD today d/t acute illness as above. Pt on vent and sedated, unresponsive. No hx obtained.   Prior admit Aug 2022 w/ LOC, new onset seizure w/ status epilepticus, severe ^BS and HTN emergency. Intubated, given Keppra IV, later dc'd because seizures were provoked. W/u revealed MRSA bacteremia, TEE neg, no evidence endocarditis. R IJ HD cath removed and replaced. Rx w/ IV vanc at OP HD mwf to complete full course as instructed by ID.  DC to ? SNF.   Prior admit Oct 2022 for complex partial seziure, jerking or RUE. Dilantin added to his Keppra, dilantin titrated up and symptoms improved significantly. [place] home on new dilantin + old keppra. Also ESRD and HTN rx'd usual inpt.     ROS - n/a   Past Medical History  Past Medical History:  Diagnosis Date   Diabetes mellitus without complication ([place])    End stage kidney disease ([place])    Erectile dysfunction    Hypertension    Vitamin D deficiency    Past Surgical History  Past Surgical History:  Procedure Laterality Date   AV FISTULA PLACEMENT Left 08/25/2021   Procedure: LEFT BRACHIOCEPHALIC ARTERIOVENOUS (AV) FISTULA CREATION;  Surgeon:  Marty Heck, MD;  Location: [facility];  Service: Vascular;  Laterality: Left;   COLONOSCOPY     IR DIALY SHUNT INTRO NEEDLE/INTRACATH INITIAL W/IMG LEFT Left 06/25/2020   IR REMOVAL TUN CV CATH W/O FL  06/03/2021   keloid removed     TEE WITHOUT CARDIOVERSION N/A 06/04/2021   Procedure: TRANSESOPHAGEAL ECHOCARDIOGRAM (TEE);  Surgeon: Skeet Latch, MD;  Location: [facility];  Service: Cardiovascular;  Laterality: N/A;   Family History  Family History  Problem Relation Age of Onset   Ovarian cancer Mother    Other Father        unsure of medical history   Social History  reports that he has quit smoking. He has never used smokeless tobacco. He reports current alcohol use. He reports that he does not use drugs. Allergies  Allergies  Allergen Reactions   Dulaglutide     TRULICITY:  GI Upset intolerance   Home medications Prior to Admission medications   Medication Sig Start Date End Date Taking? Authorizing Provider  acetaminophen (TYLENOL) 325 MG tablet Take 2 tablets (650 mg total) by mouth every 6 (six) hours as needed for fever, mild pain or headache. 06/08/21  Yes Jennye Boroughs, MD  aspirin EC 81 MG tablet Take 81 mg by mouth daily.    [provider]  atorvastatin (LIPITOR) 40 MG tablet Take 40 mg by mouth at bedtime. 05/20/20  [provider]  cholecalciferol (VITAMIN D3) 25 MCG (1000 UNIT) tablet Take 1,000 Units by mouth daily.    [provider]  diltiazem (TIAZAC) 180 MG 24 hr capsule Take 180 mg by mouth daily. 08/04/20   [provider]  glipiZIDE (GLUCOTROL) 10 MG tablet Take 10 mg by mouth daily.    [provider]  hydrALAZINE (APRESOLINE) 25 MG tablet Take 1 tablet (25 mg total) by mouth every 8 (eight) hours. 08/12/21 09-14-2021  Bonnielee Haff, MD  HYDROcodone-acetaminophen (NORCO) 5-325 MG tablet Take 1 tablet by mouth every 6 (six) hours as needed for moderate pain. 08/25/21   Dagoberto Ligas, PA-C  latanoprost  (XALATAN) 0.005 % ophthalmic solution Place 1 drop into both eyes at bedtime. 05/24/19   [provider]  levETIRAcetam (KEPPRA) 500 MG tablet Take 1 tablet (500 mg total) by mouth every Monday, Wednesday, and Friday at 6 PM. Take after hemodialysis on dialysis days. This is in addition to the other prescription. 08/13/21 10/12/21  Bonnielee Haff, MD  levETIRAcetam (KEPPRA) 750 MG tablet Take 2 tablets (1,500 mg total) by mouth 2 (two) times daily. 08/12/21 10/11/21  Bonnielee Haff, MD  lidocaine-prilocaine (EMLA) cream Apply 1 application topically 3 (three) times a week. 07/19/21   [provider]  phenytoin (DILANTIN) 50 MG tablet Chew 1.5 tablets (75 mg total) by mouth 3 (three) times daily. 08/12/21 10/11/21  Bonnielee Haff, MD  sevelamer carbonate (RENVELA) 800 MG tablet Take 800-2,400 mg by mouth See admin instructions. Take 3 tablets (2400 mg) by mouth with meals and 1 tablet (800 mg) with snacks 07/17/20   [provider]  sildenafil (REVATIO) 20 MG tablet Take 40-60 mg by mouth daily as needed (ED).  10/10/19   [provider]  sodium bicarbonate 650 MG tablet Take 1 tablet (650 mg total) by mouth 2 (two) times daily. 11/08/19   Santos-Sanchez, Merlene Morse, MD  TRADJENTA 5 MG TABS tablet Take 5 mg by mouth daily. 06/16/20   [provider]  TRESIBA FLEXTOUCH 100 UNIT/ML FlexTouch Pen Inject 22 Units into the skin daily. 06/08/21   Jennye Boroughs, MD     Vitals:   08/31/2021 1345 09/06/2021 1400 08/17/2021 1415 08/25/2021 1430  BP: (!) 241/130 (!) 247/134 (!) 183/112 (!) 193/113  Pulse: (!) 103 (!) 105 98 100  Resp: (!) 24 (!) 24 (!) 24 (!) 24  Temp:      TempSrc:      SpO2: 100% 100% 100% 100%  Weight:      Height:    5\' 10"  (1.778 m)   Exam Gen on vent, sedated No rash, cyanosis or gangrene Sclera anicteric, throat w/ ETT No jvd or bruits Chest clear anterior/ lateral RRR no MRG Abd soft ntnd no mass or ascites +bs GU normal MS no joint  effusions or deformity Ext no LE edema, no wounds or ulcers Neuro is on vent, sedated  RIJ TDC/ LUA AVF+bruit     Home meds include - asa, lipitor, diltiazem 180, hydralazine 25 tid, norco prn, keppra 1500mg  bid + 500mg  post HD mwf, dilantin 75 tid, renvela 3 ac tid, sod bicarb 650 bid, tradjenta 5 qd, tresiba 22u qd, prns/ vits/ supps  67.6kg   OP HD: MWF [place]   3h 20min  400/1.5   2/2 bath  65.5kg  Hep 2000  RIJ TDC / LUA BC AVF placed 08/25/21 (Dr Carlis Abbott)   - venofer 100 x 10  - mircera 60 q2, last 11/2    Assessment/  Plan: Status epilepticus - per CCM/ neuro, intubated, propofol gtt running, EEG monitoring ordered. AED's per neuro.  VDRF - d/t #1 HTN emergency - BP's quite high, prn Labetalol and hydralazine ordered. Would continue home BP meds through OG tube as is possible, will order.  ESRD - on HD MWF.  2kg by wts, no vol excess on exam/ CXR. K+ slightly high. Plan for HD later this evening.  DM uncont - on IV insulin gtt. I dc'd all LR/ NS IVF's in the protocol and added prn D5W when BS < 250. ESRD pt's don't need saline volume expansion for treatment of HHS/ DKA.  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   Recent Labs  Lab 09/04/2021 1201 09/09/2021 1208 09/04/2021 1210 08/22/2021 1439  K 6.0* 5.9* 5.9* 5.4*  BUN 47* 47*  --   --   CREATININE 8.40* 8.80*  --   --   CALCIUM 9.1  --   --   --

## 2021-09-01 NOTE — Progress Notes (Signed)
Patient transported to [facility] without any apparent complications.

## 2021-09-01 NOTE — Consult Note (Signed)
Neurology Consultation Note  Consult Requested by: Dr. Matilde Sprang  Reason for Consult: code stroke  Consult Date: 09/12/2021   The history was obtained from the EMS.  During history and examination, all items were not able to obtain unless otherwise noted.  History of Present Illness:  Jonathan Kane is a 75 y.o. 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 Krista Kane at [facility] presented to ED for code stroke.  Per EMS, pt was last seen at his baseline at 8pm last night by family. This morning, family came to check on him found him lying in the recliner with left forced gaze and left facial twitching. Not responding to voice. EMS called. On arrival, pt still has forced left gaze and left facial twitching, mouth smacking, also has intermittent left UE jerking. RUE flexed with tone and intermittent right foot twitching. Received 2mg  ativan, CT no acute finding but b/l frontal encephalomalacia. Still has left gaze, and left facial twitching, left arm jerking resolved. Received additional 2mg  ativan, loaded with keppra 2g. CTA head and neck no LVO, and CTP showed right hemisphere firing. Glucose level 683, pH 7.365. received insulin. Pt then found to be drowsy, sonorous and gurgly sound, he was intubated for airway protection. Now on low dose propofol, stat EEG underway.   LSN: 8pm last night tPA Given: No: outside window, likely seizure IR: no LVO, but showing seizure on CTP  Past Medical History:  Diagnosis Date   Diabetes mellitus without complication ([place])    End stage kidney disease ([place])    Erectile dysfunction    Hypertension    Vitamin D deficiency     Past Surgical History:  Procedure Laterality Date   AV FISTULA PLACEMENT Left 08/25/2021   Procedure: LEFT BRACHIOCEPHALIC ARTERIOVENOUS (AV) FISTULA CREATION;  Surgeon: Marty Heck, MD;   Location: [place];  Service: Vascular;  Laterality: Left;   COLONOSCOPY     IR DIALY SHUNT INTRO NEEDLE/INTRACATH INITIAL W/IMG LEFT Left 06/25/2020   IR REMOVAL TUN CV CATH W/O FL  06/03/2021   keloid removed     TEE WITHOUT CARDIOVERSION N/A 06/04/2021   Procedure: TRANSESOPHAGEAL ECHOCARDIOGRAM (TEE);  Surgeon: Skeet Latch, MD;  Location: [facility] ENDOSCOPY;  Service: Cardiovascular;  Laterality: N/A;    Family History  Problem Relation Age of Onset   Ovarian cancer Mother    Other Father        unsure of medical history    Social History:  reports that he has quit smoking. He has never used smokeless tobacco. He reports current alcohol use. He reports that he does not use drugs.  Allergies:  Allergies  Allergen Reactions   Dulaglutide     TRULICITY:  GI Upset intolerance    No current facility-administered medications on file prior to encounter.   Current Outpatient Medications on File Prior to Encounter  Medication Sig Dispense Refill   acetaminophen (TYLENOL) 325 MG tablet Take 2 tablets (650 mg total) by mouth every 6 (six) hours as needed for fever, mild pain or headache.     aspirin EC 81 MG tablet Take 81 mg by mouth daily.     atorvastatin (LIPITOR) 40 MG tablet Take 40 mg by mouth at bedtime.     cholecalciferol (VITAMIN D3) 25 MCG (1000 UNIT) tablet Take 1,000 Units by mouth daily.     diltiazem (TIAZAC) 180  MG 24 hr capsule Take 180 mg by mouth daily.     glipiZIDE (GLUCOTROL) 10 MG tablet Take 10 mg by mouth daily.     hydrALAZINE (APRESOLINE) 25 MG tablet Take 1 tablet (25 mg total) by mouth every 8 (eight) hours. 90 tablet 0   HYDROcodone-acetaminophen (NORCO) 5-325 MG tablet Take 1 tablet by mouth every 6 (six) hours as needed for moderate pain. 10 tablet 0   latanoprost (XALATAN) 0.005 % ophthalmic solution Place 1 drop into both eyes at bedtime.     levETIRAcetam (KEPPRA) 500 MG tablet Take 1 tablet (500 mg total) by mouth every Monday, Wednesday, and Friday at 6  PM. Take after hemodialysis on dialysis days. This is in addition to the other prescription. 12 tablet 1   levETIRAcetam (KEPPRA) 750 MG tablet Take 2 tablets (1,500 mg total) by mouth 2 (two) times daily. 120 tablet 1   lidocaine-prilocaine (EMLA) cream Apply 1 application topically 3 (three) times a week.     phenytoin (DILANTIN) 50 MG tablet Chew 1.5 tablets (75 mg total) by mouth 3 (three) times daily. 135 tablet 1   sevelamer carbonate (RENVELA) 800 MG tablet Take 800-2,400 mg by mouth See admin instructions. Take 3 tablets (2400 mg) by mouth with meals and 1 tablet (800 mg) with snacks     sildenafil (REVATIO) 20 MG tablet Take 40-60 mg by mouth daily as needed (ED).      sodium bicarbonate 650 MG tablet Take 1 tablet (650 mg total) by mouth 2 (two) times daily. 60 tablet 0   TRADJENTA 5 MG TABS tablet Take 5 mg by mouth daily.     TRESIBA FLEXTOUCH 100 UNIT/ML FlexTouch Pen Inject 22 Units into the skin daily.      Review of Systems: A full ROS was attempted today and was not able to be performed.  Systems assessed include - Constitutional, Eyes, HENT, Respiratory, Cardiovascular, Gastrointestinal, Genitourinary, Integument/breast, Hematologic/lymphatic, Musculoskeletal, Neurological, Behavioral/Psych, Endocrine, Allergic/Immunologic - with pertinent responses as per HPI.  Physical Examination: Pulse Rate:  [101-112] 107 (11/16 1246) Resp:  [18-36] 36 (11/16 1246) BP: (223-238)/(120-136) 234/127 (11/16 1246) SpO2:  [100 %] 100 % (11/16 1246) Weight:  [67.6 kg] 67.6 kg (11/16 1200)  General - well nourished, well developed, eyes open but unresponsive.    Ophthalmologic - fundi not visualized due to noncooperation.    Cardiovascular - regular rhythm and rate  Neuro - eyes open but not responsive. Not following commands, eyes left forced gaze, not blinking to visual threat. Nonverbal, not answering questions. Right facial droop, left facial twitching with mouth smacking. Left UE  intermittent jerking movement, RUE flexed position with increased tone. Not movement of BLE on pain stimulation, except intermittent right foot twitching. Sensation, coordination and gait not tested.   Data Reviewed: CT HEAD CODE STROKE WO CONTRAST  Result Date: 08/22/2021 CLINICAL DATA:  Code stroke.  Neuro deficit, acute stroke suspected EXAM: CT HEAD WITHOUT CONTRAST TECHNIQUE: Contiguous axial images were obtained from the base of the skull through the vertex without intravenous contrast. COMPARISON:  Brain MRI 08/08/2021, CT head 08/07/2021 FINDINGS: Brain: There is no evidence of acute intracranial hemorrhage, extra-axial fluid collection, or acute infarct. 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 The ventricles are stable in size. Confluent hypodensity in the subcortical and periventricular white matter is unchanged, likely reflecting sequela of advanced chronic white matter  microangiopathy. There is no solid mass lesion.  There is no midline shift. Vascular: There is calcification of the bilateral cavernous ICAs. Skull: Normal. Negative for fracture or focal lesion. Sinuses/Orbits: The paranasal sinuses are clear. Bilateral lens implants are in place. The globes and orbits are otherwise unremarkable. Other: None. ASPECTS [facility] Stroke Program Early CT Score) - Ganglionic level infarction (caudate, lentiform nuclei, internal capsule, insula, M1-M3 cortex): 7 - Supraganglionic infarction (M4-M6 cortex): 3 Total score (0-10 with 10 being normal): 10 IMPRESSION: 1. No acute intracranial hemorrhage or infarct. 2. ASPECTS is 10. 3. 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 These results were paged via AMION at the time of interpretation on 08/25/2021 at 12:16 pm to provider Dr Erlinda Hong ts. Electronically Signed    By: Valetta Mole M.D.   On: 08/23/2021 12:17   CT ANGIO HEAD NECK W WO CM W PERF (CODE STROKE)  Result Date: 08/21/2021 CLINICAL DATA:  Stroke, follow up EXAM: CT ANGIOGRAPHY HEAD AND NECK CT PERFUSION BRAIN TECHNIQUE: Multidetector CT imaging of the head and neck was performed using the standard protocol during bolus administration of intravenous contrast. Multiplanar CT image reconstructions and MIPs were obtained to evaluate the vascular anatomy. Carotid stenosis measurements (when applicable) are obtained utilizing NASCET criteria, using the distal internal carotid diameter as the denominator. Multiphase CT imaging of the brain was performed following IV bolus contrast injection. Subsequent parametric perfusion maps were calculated using RAPID software. CONTRAST:  145mL OMNIPAQUE IOHEXOL 350 MG/ML SOLN COMPARISON:  None. FINDINGS: CTA NECK Motion artifact is present. Aortic arch: Great vessel origins are patent. Right carotid system: Patent. Eccentric mild mixed plaque along the proximal internal carotid without stenosis. Left carotid system: Patent.  No stenosis. Vertebral arteries: Patent and codominant.  No stenosis. Skeleton: Degenerative changes of the cervical spine. Bulky anterior osteophytes at C4-C7. Multilevel canal and foraminal narrowing. Other neck: No mass or adenopathy. Upper chest: Included upper lungs are clear. Review of the MIP images confirms the above findings CTA HEAD Anterior circulation: Intracranial internal carotid arteries are patent with mild calcified plaque. Anterior and middle cerebral arteries are patent. Posterior circulation: Intracranial vertebral arteries are patent. Basilar artery is patent. Major cerebellar artery origins are patent. Posterior cerebral arteries are patent. Venous sinuses: Patent as allowed by contrast bolus timing. Review of the MIP images confirms the above findings CT Brain Perfusion Findings: CBF (<30%) Volume: 17mL Perfusion (Tmax>6.0s) volume: 40mL  Mismatch Volume: 30mL Infarction Location: Anterior left frontal lobe corresponding to chronic infarct on noncontrast study. IMPRESSION: No large vessel occlusion, hemodynamically significant stenosis, or evidence of dissection. 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 Electronically Signed   By: Macy Mis M.D.   On: 08/25/2021 12:46     Assessment: 75 y.o. 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 Krista Kane at [facility] presented to ED for forced left gaze and left facial twitching, mouth smacking, also has intermittent left UE jerking. RUE flexed with tone and intermittent right foot twitching. Received 2mg  ativan, CT no acute finding but b/l frontal encephalomalacia. Still has left gaze, and left facial twitching, left arm jerking resolved. Received additional 2mg  ativan, loaded with keppra 2g. CTA head and neck no LVO, and CTP showed right hemisphere firing. Glucose level 683, pH 7.365.  received insulin. Pt then found to be drowsy, sonorous and gurgly sound, he was intubated for airway protection. Now on low dose propofol, stat EEG underway. Dilantin level came back, low, will give fosphenytoin load also.   Plan: ICU admission for further vent management On propofol now. If EEG showing continued seizure, will need up propofol  S/p keppra 2g load Dilantin level low, will give fosphenytoin load followed by 100mg  q8h for maintenance.  Check dilantin level after load and then daily Stat EEG and LTM EEG monitoring MRI with and without contrast if needed  Stat EEG or LTM EEG DKA or HHS management per EDP and CCM Discussed with Dr. Matilde Sprang and Dr. Lamonte Sakai as well as Dr. Hortense Ramal We will follow   Thank you for this consultation and allowing [place] to participate in the care of  this patient.  Rosalin Hawking, MD PhD Stroke Neurology 08/30/2021 1:39 PM  This patient is critically ill due to seizure, status epilepticus, HHS vs. DKA, hyperglycemia, respiratory failure and at significant risk of neurological worsening, death form status epilepticus, HHS, heart failure, stroke. This patient's care requires constant monitoring of vital signs, hemodynamics, respiratory and cardiac monitoring, review of multiple databases, neurological assessment, discussion with family, other specialists and medical decision making of high complexity. I spent 60 minutes of neurocritical care time in the care of this patient.

## 2021-09-01 NOTE — ED Triage Notes (Signed)
Pt BIB [facility] from home d/t family reports LKN 2000 yesterday & he was found sitting on his couch (as seen through the window) not responding to them knocking on the door. EMS reports CBG reading "HI," Left sided gaze, a history of recent stroke. 228/126, last dialysis done Monday, 18G PIV.

## 2021-09-01 NOTE — Code Documentation (Signed)
Stroke Response Nurse Documentation Code Documentation  Jonathan Kane is a 75 y.o. male arriving to [facility] ED via [facility] on 08/18/2021 with past medical hx of hypertension, diabetes, end stage kidney disease on HD, and seizures.Code stroke was activated by EMS.   Patient from home where he was LKW at 08/31/21 at 2000 per family. Patient lives alone. Family visited patient today and were unable to get patient to let them in. Could see patient on the couch. Call 911. EMS found patient on couch unable to respond, fixed left gaze, and seizure like activity.   Stroke team at the bedside on patient arrival. Labs drawn and patient cleared for CT by Dr. Matilde Sprang. Patient to CT with team. NIHSS 23, see documentation for details and code stroke times. Patient with disoriented, not following commands, left gaze preference , right facial droop, bilateral arm weakness, bilateral leg weakness, Global aphasia , dysarthria , and Visual  neglect on exam. The following imaging was completed: CT, CTA head and neck, CTP. Given 4mg  ativan, see MAR. Patient is not a candidate for IV Thrombolytic due to stroke not suspected. Believed to be seizure activity. Intubated for airway protection. Given etomidate and roc, see MAR. BP elevated. Propofol gtt started.   Care/Plan: EEG ordered; admit to ICU.   Bedside handoff with ED RN Arby Barrette.    Earma Reading  Stroke Response RN

## 2021-09-01 NOTE — ED Provider Notes (Signed)
[facility] EMERGENCY DEPARTMENT Provider Note   CSN: [id number] Arrival date & time: 08/20/2021  1201     History No chief complaint on file.   Jonathan Kane is a 75 y.o. male with PMH ESRD on hemodialysis Monday Wednesday Friday, T2DM, HTN, HLD, no documented seizure history but a questionable history surrounding right lower extremity jerking and currently on Dilantin who presents the emergency department as a stroke alert for dysarthria and left-sided gaze.  Patient was last seen by family at 34 PM last night on 08/31/2021 and was found this morning to be in a recliner with left-sided gaze and facial jerking as well as right lower extremity jerking.  Patient intermittently will retract from painful stimuli but facial twitching and right lower extremity twitching as well as gaze palsy is persistent.  Additional review of systems unable to be obtained as the patient is unresponsive with concern for active seizure.  HPI     Past Medical History:  Diagnosis Date   Diabetes mellitus without complication ([place])    End stage kidney disease ([place])    Erectile dysfunction    Hypertension    Vitamin D deficiency     Patient Active Problem List   Diagnosis Date Noted   Seizure ([place]) 08/08/2021   Hypertensive urgency 08/08/2021   Uncontrolled type 2 diabetes mellitus with hyperglycemia ([place]) 08/08/2021   ESRD on hemodialysis ([place]) 08/08/2021   Hypertensive crisis 08/07/2021   MRSA bacteremia 06/02/2021   Malnutrition of moderate degree 06/01/2021   DKA (diabetic ketoacidosis) ([place]) 05/28/2021   Status epilepticus ([place])    Memory loss 04/13/2021   Cerebrovascular accident (CVA) ([place]) 01/12/2021   DM type 2 with diabetic peripheral neuropathy ([place]) 01/12/2021   Normal anion gap metabolic acidosis 50/93/2671   Secondary hyperparathyroidism of renal origin ([place]) 24/58/0998   Acute metabolic encephalopathy 33/82/5053   Acute on chronic renal failure ([place]) 11/07/2019    CKD (chronic kidney disease) stage 5, GFR less than 15 ml/min (HCC) 09/02/2019   CKD (chronic kidney disease) stage 4, GFR 15-29 ml/min (HCC) 08/01/2019   Paronychia of fifth toe of left foot 05/29/2019   Microalbuminuria due to type 2 diabetes mellitus ([place]) 02/14/2019   Posterior vitreous detachment of both eyes 01/17/2019   Presbyopia of both eyes 01/17/2019   Pseudophakia of both eyes 01/17/2019   Abnormal finding on diagnostic imaging of right testicle 09/14/2018   Epididymitis, left 09/14/2018   Swelling of left testicle 08/23/2018   Primary open angle glaucoma (POAG) of both eyes, mild stage 01/02/2018   Benign hypertension with chronic kidney disease, stage V ([place]) 11/30/2017   Proteinuria 11/30/2017   Mouth burn, initial encounter 11/10/2017   Vitamin D deficiency, unspecified 11/10/2017   Erectile dysfunction associated with type 2 diabetes mellitus ([place]) 08/29/2017   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 ([place]) 08/16/2017   Hypoglycemia 08/16/2017   PCO (posterior capsular opacification), left 03/03/2017   Hypermetropia of both eyes 03/03/2017   Cocaine dependence in remission ([place]) 07/26/2016   Herpes simplex infection 07/26/2016   CKD stage 4 due to type 2 diabetes mellitus ([place]) 07/07/2016   Noncompliance with diabetes treatment 07/07/2016   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 03/21/2016   Combined arterial insufficiency and corporo-venous occlusive erectile dysfunction 03/04/2016   Allergic rhinitis with postnasal drip 02/24/2016   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   Type 2 diabetes mellitus ([place]) 02/23/2016   Essential hypertension 02/23/2016   Musculoskeletal chest pain 02/23/2016   BPH  with obstruction/lower urinary tract symptoms 02/10/2016   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[place]) 12/09/2015   Polyneuropathy, unspecified 11/12/2013   Constipation 03/23/2013   Hallucinations 03/23/2013   Polyneuropathy in diabetes ([place])  03/23/2013   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[place]) 08/30/2011   Diabetes mellitus type II, controlled ([place]) 08/30/2011   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    Past Surgical History:  Procedure Laterality Date   AV FISTULA PLACEMENT Left 08/25/2021   Procedure: LEFT BRACHIOCEPHALIC ARTERIOVENOUS (AV) FISTULA CREATION;  Surgeon: Marty Heck, MD;  Location: [facility] OR;  Service: Vascular;  Laterality: Left;   COLONOSCOPY     IR DIALY SHUNT INTRO NEEDLE/INTRACATH INITIAL W/IMG LEFT Left 06/25/2020   IR REMOVAL TUN CV CATH W/O FL  06/03/2021   keloid removed     TEE WITHOUT CARDIOVERSION N/A 06/04/2021   Procedure: TRANSESOPHAGEAL ECHOCARDIOGRAM (TEE);  Surgeon: Skeet Latch, MD;  Location: [facility] ENDOSCOPY;  Service: Cardiovascular;  Laterality: N/A;       Family History  Problem Relation Age of Onset   Ovarian cancer Mother    Other Father        unsure of medical history    Social History   Tobacco Use   Smoking status: Former   Smokeless tobacco: Never  Vaping Use   Vaping Use: Never used  Substance Use Topics   Alcohol use: Yes    Comment: very litte - occasional beer   Drug use: No    Home Medications Prior to Admission medications   Medication Sig Start Date End Date Taking? Authorizing Provider  acetaminophen (TYLENOL) 325 MG tablet Take 2 tablets (650 mg total) by mouth every 6 (six) hours as needed for fever, mild pain or headache. 06/08/21   Jennye Boroughs, MD  aspirin EC 81 MG tablet Take 81 mg by mouth daily.    [provider]  atorvastatin (LIPITOR) 40 MG tablet Take 40 mg by mouth at bedtime. 05/20/20   [provider]  cholecalciferol (VITAMIN D3) 25 MCG (1000 UNIT) tablet Take 1,000 Units by mouth daily.    [provider]  diltiazem (TIAZAC) 180 MG 24 hr capsule Take 180 mg by mouth daily. 08/04/20   [provider]  glipiZIDE (GLUCOTROL) 10 MG tablet Take 10 mg by mouth daily.    [provider]  hydrALAZINE  (APRESOLINE) 25 MG tablet Take 1 tablet (25 mg total) by mouth every 8 (eight) hours. 08/12/21 09-20-21  Bonnielee Haff, MD  HYDROcodone-acetaminophen (NORCO) 5-325 MG tablet Take 1 tablet by mouth every 6 (six) hours as needed for moderate pain. 08/25/21   Dagoberto Ligas, PA-C  latanoprost (XALATAN) 0.005 % ophthalmic solution Place 1 drop into both eyes at bedtime. 05/24/19   [provider]  levETIRAcetam (KEPPRA) 500 MG tablet Take 1 tablet (500 mg total) by mouth every Monday, Wednesday, and Friday at 6 PM. Take after hemodialysis on dialysis days. This is in addition to the other prescription. 08/13/21 10/12/21  Bonnielee Haff, MD  levETIRAcetam (KEPPRA) 750 MG tablet Take 2 tablets (1,500 mg total) by mouth 2 (two) times daily. 08/12/21 10/11/21  Bonnielee Haff, MD  lidocaine-prilocaine (EMLA) cream Apply 1 application topically 3 (three) times a week. 07/19/21   [provider]  phenytoin (DILANTIN) 50 MG tablet Chew 1.5 tablets (75 mg total) by mouth 3 (three) times daily. 08/12/21 10/11/21  Bonnielee Haff, MD  sevelamer carbonate (RENVELA) 800 MG tablet Take 800-2,400 mg by mouth See admin instructions. Take 3 tablets (2400 mg) by mouth with meals  and 1 tablet (800 mg) with snacks 07/17/20   [provider]  sildenafil (REVATIO) 20 MG tablet Take 40-60 mg by mouth daily as needed (ED).  10/10/19   [provider]  sodium bicarbonate 650 MG tablet Take 1 tablet (650 mg total) by mouth 2 (two) times daily. 11/08/19   Santos-Sanchez, Merlene Morse, MD  TRADJENTA 5 MG TABS tablet Take 5 mg by mouth daily. 06/16/20   [provider]  TRESIBA FLEXTOUCH 100 UNIT/ML FlexTouch Pen Inject 22 Units into the skin daily. 06/08/21   Jennye Boroughs, MD    Allergies    Dulaglutide  Review of Systems   Review of Systems  Unable to perform ROS: Patient unresponsive   Physical Exam Updated Vital Signs BP (!) 234/127   Pulse (!) 107   Resp (!) 36   Ht 5\' 10"  (1.778  m)   Wt 67.6 kg   SpO2 100%   BMI 21.38 kg/m   Physical Exam Vitals and nursing note reviewed.  Constitutional:      Appearance: He is well-developed.     Comments: Minimally responsive  HENT:     Head: Normocephalic and atraumatic.  Eyes:     Conjunctiva/sclera: Conjunctivae normal.  Cardiovascular:     Rate and Rhythm: Regular rhythm. Tachycardia present.     Heart sounds: No murmur heard. Pulmonary:     Effort: Pulmonary effort is normal. No respiratory distress.     Breath sounds: Normal breath sounds.  Abdominal:     Palpations: Abdomen is soft.     Tenderness: There is no abdominal tenderness.  Musculoskeletal:     Cervical back: Neck supple.  Skin:    General: Skin is warm and dry.  Neurological:     Cranial Nerves: Cranial nerve deficit present.     Motor: Weakness present.     Comments: Persistent left-sided facial twitching and jerking, lipsmacking, left-sided gaze palsy    ED Results / Procedures / Treatments   Labs (all labs ordered are listed, but only abnormal results are displayed) Labs Reviewed  CBC - Abnormal; Notable for the following components:      Result Value   RBC 4.10 (*)    Hemoglobin 12.9 (*)    HCT 38.3 (*)    All other components within normal limits  I-STAT CHEM 8, ED - Abnormal; Notable for the following components:   Sodium 129 (*)    Potassium 5.9 (*)    BUN 47 (*)    Creatinine, Ser 8.80 (*)    Glucose, Bld 683 (*)    Calcium, Ion 1.03 (*)    All other components within normal limits  CBG MONITORING, ED - Abnormal; Notable for the following components:   Glucose-Capillary >600 (*)    All other components within normal limits  I-STAT VENOUS BLOOD GAS, ED - Abnormal; Notable for the following components:   pCO2, Ven 41.6 (*)    pO2, Ven 54.0 (*)    Sodium 129 (*)    Potassium 5.9 (*)    Calcium, Ion 1.02 (*)    All other components within normal limits  PROTIME-INR  APTT  DIFFERENTIAL  COMPREHENSIVE METABOLIC PANEL   PHENYTOIN LEVEL, TOTAL  LACTIC ACID, PLASMA  LACTIC ACID, PLASMA    EKG None  Radiology CT HEAD CODE STROKE WO CONTRAST  Result Date: 09/15/2021 CLINICAL DATA:  Code stroke.  Neuro deficit, acute stroke suspected EXAM: CT HEAD WITHOUT CONTRAST TECHNIQUE: Contiguous axial images were obtained from the base of the  skull through the vertex without intravenous contrast. COMPARISON:  Brain MRI 08/08/2021, CT head 08/07/2021 FINDINGS: Brain: There is no evidence of acute intracranial hemorrhage, extra-axial fluid collection, or acute infarct. 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 The ventricles are stable in size. 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 There is no solid mass lesion.  There is no midline shift. Vascular: There is calcification of the bilateral cavernous ICAs. Skull: Normal. Negative for fracture or focal lesion. Sinuses/Orbits: The paranasal sinuses are clear. Bilateral lens implants are in place. The globes and orbits are otherwise unremarkable. Other: None. ASPECTS [facility] Stroke Program Early CT Score) - Ganglionic level infarction (caudate, lentiform nuclei, internal capsule, insula, M1-M3 cortex): 7 - Supraganglionic infarction (M4-M6 cortex): 3 Total score (0-10 with 10 being normal): 10 IMPRESSION: 1. No acute intracranial hemorrhage or infarct. 2. ASPECTS is 10. 3. 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 These results were paged via AMION at the time of interpretation on 08/24/2021 at 12:16 pm to provider Dr Erlinda Hong ts. Electronically Signed   By: Valetta Mole M.D.   On: 08/20/2021 12:17   CT ANGIO HEAD NECK W WO CM W PERF (CODE STROKE)  Result Date:  09/15/2021 CLINICAL DATA:  Stroke, follow up EXAM: CT ANGIOGRAPHY HEAD AND NECK CT PERFUSION BRAIN TECHNIQUE: Multidetector CT imaging of the head and neck was performed using the standard protocol during bolus administration of intravenous contrast. Multiplanar CT image reconstructions and MIPs were obtained to evaluate the vascular anatomy. Carotid stenosis measurements (when applicable) are obtained utilizing NASCET criteria, using the distal internal carotid diameter as the denominator. Multiphase CT imaging of the brain was performed following IV bolus contrast injection. Subsequent parametric perfusion maps were calculated using RAPID software. CONTRAST:  18mL OMNIPAQUE IOHEXOL 350 MG/ML SOLN COMPARISON:  None. FINDINGS: CTA NECK Motion artifact is present. Aortic arch: Great vessel origins are patent. Right carotid system: Patent. Eccentric mild mixed plaque along the proximal internal carotid without stenosis. Left carotid system: Patent.  No stenosis. Vertebral arteries: Patent and codominant.  No stenosis. Skeleton: Degenerative changes of the cervical spine. Bulky anterior osteophytes at C4-C7. Multilevel canal and foraminal narrowing. Other neck: No mass or adenopathy. Upper chest: Included upper lungs are clear. Review of the MIP images confirms the above findings CTA HEAD Anterior circulation: Intracranial internal carotid arteries are patent with mild calcified plaque. Anterior and middle cerebral arteries are patent. Posterior circulation: Intracranial vertebral arteries are patent. Basilar artery is patent. Major cerebellar artery origins are patent. Posterior cerebral arteries are patent. Venous sinuses: Patent as allowed by contrast bolus timing. Review of the MIP images confirms the above findings CT Brain Perfusion Findings: CBF (<30%) Volume: 29mL Perfusion (Tmax>6.0s) volume: 27mL Mismatch Volume: 68mL Infarction Location: Anterior left frontal lobe corresponding to chronic infarct on  noncontrast study. IMPRESSION: No large vessel occlusion, hemodynamically significant stenosis, or evidence of dissection. 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 Electronically Signed   By: Macy Mis M.D.   On: 09/12/2021 12:46    Procedures .Critical Care Performed by: Teressa Lower, MD Authorized by: Teressa Lower, MD   Critical care provider statement:    Critical care time (minutes):  60  Critical care was necessary to treat or prevent imminent or life-threatening deterioration of the following conditions:  Respiratory failure and CNS failure or compromise (status epilepticus)   Critical care was time spent personally by me on the following activities:  Development of treatment plan with patient or surrogate, discussions with consultants, evaluation of patient's response to treatment, examination of patient, ordering and review of laboratory studies, ordering and review of radiographic studies, ordering and performing treatments and interventions, pulse oximetry, re-evaluation of patient's condition and review of old charts Procedure Name: Intubation Date/Time: 08/28/2021 1:09 PM Performed by: Hendricks Limes, PA-C Pre-anesthesia Checklist: Patient identified, Patient being monitored, Emergency Drugs available, Timeout performed and Suction available Oxygen Delivery Method: Ambu bag Preoxygenation: Pre-oxygenation with 100% oxygen Induction Type: Rapid sequence Ventilation: Mask ventilation without difficulty Laryngoscope Size: Glidescope and 3 Tube size: 7.5 mm Number of attempts: 1 Airway Equipment and Method: Video-laryngoscopy Placement Confirmation: ETT inserted through vocal cords under direct vision, CO2 detector and Breath sounds checked- equal and bilateral Secured at: 24 cm Tube secured with: ETT holder Comments: Procedure performed by Ranell Patrick PA with  direct intraprocedure assistance from Teressa Lower MD      Medications Ordered in ED Medications  levETIRAcetam (KEPPRA) IVPB 1000 mg/100 mL premix (0 mg Intravenous Stopped 09/12/2021 1242)    And  levETIRAcetam (KEPPRA) IVPB 1000 mg/100 mL premix (1,000 mg Intravenous New Bag/Given 08/22/2021 1238)  propofol (DIPRIVAN) 1000 MG/100ML infusion (has no administration in time range)  sodium chloride flush (NS) 0.9 % injection 3 mL (3 mLs Intravenous Given 09/09/2021 1237)  LORazepam (ATIVAN) injection 2 mg (2 mg Intravenous Given 08/18/2021 1205)  insulin aspart (novoLOG) injection 5 Units (5 Units Intravenous Given 08/31/2021 1237)  propofol (DIPRIVAN) 1000 MG/100ML infusion (5 mcg  New Bag/Given 08/17/2021 1243)  iohexol (OMNIPAQUE) 350 MG/ML injection 100 mL (100 mLs Intravenous Contrast Given 08/17/2021 1230)  LORazepam (ATIVAN) injection 2 mg (2 mg Intravenous Given 08/26/2021 1214)  etomidate (AMIDATE) injection 20 mg (20 mg Intravenous Given 09/06/2021 1240)  rocuronium (ZEMURON) injection 100 mg (100 mg Intravenous Given 08/19/2021 1241)    ED Course  I have reviewed the triage vital signs and the nursing notes.  Pertinent labs & imaging results that were available during my care of the patient were reviewed by me and considered in my medical decision making (see chart for details).    MDM Rules/Calculators/A&P                           Patient seen emergency department for evaluation of a stroke alert and greater concern for status epilepticus.  Physical exam reveals right-sided twitching, left-sided facial twitching, lipsmacking and left-sided gaze palsy.  Initial laboratory evaluation with a sodium of 129, potassium 5.9, creatinine elevation to 8.80, glucose 683 but initial i-STAT with pH 7.36, PCO2 41.6, bicarb 23.8.  While in the Scanner, patient received 2 doses of 2 mg Ativan with persistent seizure-like activity.  CT head stroke with no acute intracranial hemorrhage or infarct,  encephalomalacia in the left frontal and temporal lobes related to prior insult, CT angio and perfusion with no LVO, small core infarction in the anterior left frontal lobe corresponding to chronic infarct, neurology concern for active seizure-like activity seen on CTP.  After CT imaging, patient given 2 g Keppra and seizure-like activity continued.  Patient started to have gurgling respirations and involuntary defecation prompting intubation for airway protection and need for global suppression with propofol.  Patient intubated here in the emergency department and placed on an increased respiratory rate to attempt to match patient's preintubation respiratory rate.  Patient then admitted to the ICU on propofol. Final Clinical Impression(s) / ED Diagnoses Final diagnoses:  None    Rx / DC Orders ED Discharge Orders     None        Keriana Sarsfield, MD 08/18/2021 1315

## 2021-09-01 NOTE — Progress Notes (Signed)
Order will be cancelled. Patient never applied the Event monitor

## 2021-09-01 NOTE — Progress Notes (Signed)
eLink Physician-Brief Progress Note Patient Name: Jonathan Kane DOB: [birth year] MRN: [id number]   Date of Service  09/02/2021  HPI/Events of Note  Patient with a transiently low BP but it improved with reducing Propofol gtt rate. Patient's anion gap has closed and he needs to transition off Insulin gtt.  eICU Interventions  Hyperglycemia Insulin gtt transition orders entered.        Kerry Kass Katrianna Friesenhahn 09/09/2021, 8:55 PM

## 2021-09-01 NOTE — Progress Notes (Signed)
Inpatient Diabetes Program Recommendations  AACE/ADA: New Consensus Statement on Inpatient Glycemic Control (2015)  Target Ranges:  Prepandial:   less than 140 mg/dL      Peak postprandial:   less than 180 mg/dL (1-2 hours)      Critically ill patients:  140 - 180 mg/dL   Lab Results  Component Value Date   GLUCAP >600 ([place]) 08/31/2021   HGBA1C 10.6 (H) 08/08/2021    Review of Glycemic Control Results for Jonathan Kane, Jonathan Kane (MRN [id number]) as of 08/26/2021 13:26  Ref. Range 08/23/2021 12:02  Glucose-Capillary Latest Ref Range: 70 - 99 mg/dL >600 ([facility])   Diabetes history: DM Outpatient Diabetes medications: Glucotrol 10 mg daily, Tradjenta 5 mg daily, Tresiba 22 units daily  Inpatient Diabetes Program Recommendations:   Note glucose>600 mg/dL.  Recommend use of IV insulin- ICU glycemic control order set with goal blood sugars of 140-180 mg/dL.    Thanks,  Adah Perl, RN, BC-ADM Inpatient Diabetes Coordinator Pager [phone]  (8a-5p)

## 2021-09-01 NOTE — Progress Notes (Signed)
vLTM started all impedances below 8kohms  [facility] notified  Patient event button tested

## 2021-09-01 NOTE — ED Notes (Signed)
Pt is a difficult stick, Stroke RN & this RN has tried many times to gain better PIV access, no luck so far.

## 2021-09-02 ENCOUNTER — Inpatient Hospital Stay (HOSPITAL_COMMUNITY): Payer: Medicare Other

## 2021-09-02 DIAGNOSIS — N186 End stage renal disease: Secondary | ICD-10-CM | POA: Diagnosis not present

## 2021-09-02 DIAGNOSIS — E875 Hyperkalemia: Secondary | ICD-10-CM | POA: Diagnosis not present

## 2021-09-02 DIAGNOSIS — J9601 Acute respiratory failure with hypoxia: Secondary | ICD-10-CM

## 2021-09-02 DIAGNOSIS — G40901 Epilepsy, unspecified, not intractable, with status epilepticus: Secondary | ICD-10-CM | POA: Diagnosis not present

## 2021-09-02 LAB — GLUCOSE, CAPILLARY
Glucose-Capillary: 110 mg/dL — ABNORMAL HIGH (ref 70–99)
Glucose-Capillary: 120 mg/dL — ABNORMAL HIGH (ref 70–99)
Glucose-Capillary: 214 mg/dL — ABNORMAL HIGH (ref 70–99)
Glucose-Capillary: 252 mg/dL — ABNORMAL HIGH (ref 70–99)
Glucose-Capillary: 256 mg/dL — ABNORMAL HIGH (ref 70–99)
Glucose-Capillary: 259 mg/dL — ABNORMAL HIGH (ref 70–99)
Glucose-Capillary: 323 mg/dL — ABNORMAL HIGH (ref 70–99)
Glucose-Capillary: 476 mg/dL — ABNORMAL HIGH (ref 70–99)

## 2021-09-02 LAB — BASIC METABOLIC PANEL
Anion gap: 13 (ref 5–15)
Anion gap: 16 — ABNORMAL HIGH (ref 5–15)
BUN: 19 mg/dL (ref 8–23)
BUN: 24 mg/dL — ABNORMAL HIGH (ref 8–23)
CO2: 24 mmol/L (ref 22–32)
CO2: 19 mmol/L — ABNORMAL LOW (ref 22–32)
Calcium: 9 mg/dL (ref 8.9–10.3)
Calcium: 9 mg/dL (ref 8.9–10.3)
Chloride: 96 mmol/L — ABNORMAL LOW (ref 98–111)
Chloride: 97 mmol/L — ABNORMAL LOW (ref 98–111)
Creatinine, Ser: 5.3 mg/dL — ABNORMAL HIGH (ref 0.61–1.24)
Creatinine, Ser: 6.16 mg/dL — ABNORMAL HIGH (ref 0.61–1.24)
GFR, Estimated: 11 mL/min — ABNORMAL LOW (ref >60)
GFR, Estimated: 9 mL/min — ABNORMAL LOW (ref >60)
Glucose, Bld: 243 mg/dL — ABNORMAL HIGH (ref 70–99)
Glucose, Bld: 262 mg/dL — ABNORMAL HIGH (ref 70–99)
Potassium: 6.1 mmol/L — ABNORMAL HIGH (ref 3.5–5.1)
Potassium: 6 mmol/L — ABNORMAL HIGH (ref 3.5–5.1)
Sodium: 133 mmol/L — ABNORMAL LOW (ref 135–145)
Sodium: 132 mmol/L — ABNORMAL LOW (ref 135–145)

## 2021-09-02 LAB — MAGNESIUM
Magnesium: 2.1 mg/dL (ref 1.7–2.4)
Magnesium: 2.3 mg/dL (ref 1.7–2.4)

## 2021-09-02 LAB — HEPATITIS B SURFACE ANTIGEN: Hepatitis B Surface Ag: NONREACTIVE

## 2021-09-02 LAB — TRIGLYCERIDES: Triglycerides: 59 mg/dL (ref <150)

## 2021-09-02 LAB — HEPATITIS B SURFACE ANTIBODY,QUALITATIVE: Hep B S Ab: REACTIVE — AB

## 2021-09-02 LAB — PHENYTOIN LEVEL, TOTAL: Phenytoin Lvl: 13.7 ug/mL (ref 10.0–20.0)

## 2021-09-02 LAB — PHOSPHORUS
Phosphorus: 3 mg/dL (ref 2.5–4.6)
Phosphorus: 4.5 mg/dL (ref 2.5–4.6)

## 2021-09-02 IMAGING — DX DG CHEST 1V PORT
1 series · 1 of 1 positions shown · non-contrast
Comparison: Chest radiograph dated 1 day prior

CLINICAL DATA: Respiratory failure

EXAM:
PORTABLE CHEST 1 VIEW

[chest ap]
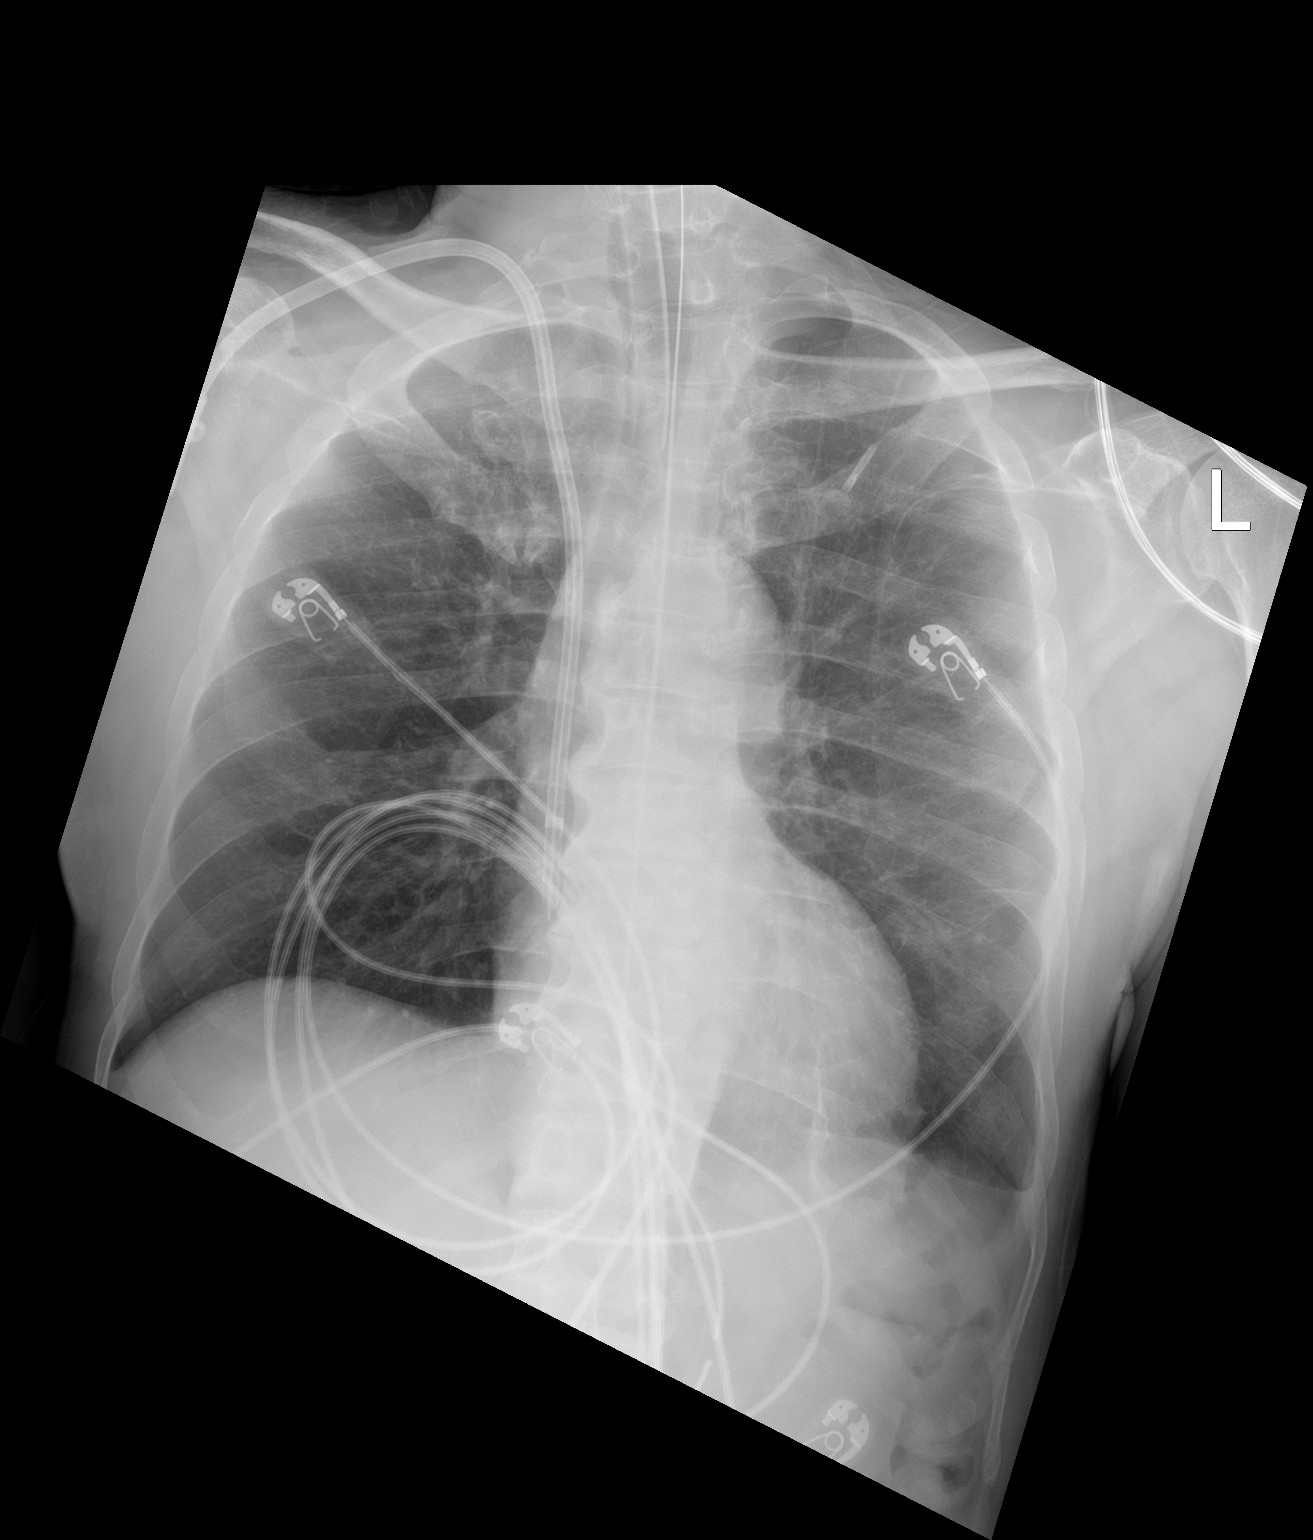

[1 of 1 positions shown; findings below may reference images not displayed]

FINDINGS: The endotracheal tube tip is in the midthoracic trachea
approximately 3.7 cm from the carina. A right-sided vascular
catheter terminates in the right atrium. The enteric catheter is in
place with the tip coursing off the field of view.

There is a small left pleural effusion which has increased in
conspicuity. There is no focal consolidation or pulmonary edema.
There is no right pleural effusion. There is no appreciable
pneumothorax.

The bones are stable.
IMPRESSION: 1. Support devices as above.
2. Increased size of a small left pleural effusion.

## 2021-09-02 MED ORDER — SODIUM CHLORIDE 0.9 % IV SOLN
750.0000 mg | Freq: Two times a day (BID) | INTRAVENOUS | Status: DC
  Administered 2021-09-02 – 2021-09-11 (×18): 750 mg via INTRAVENOUS
  Filled 2021-09-02 (×19): qty 7.5

## 2021-09-02 MED ORDER — CHLORHEXIDINE GLUCONATE 0.12 % MT SOLN
OROMUCOSAL | Status: AC
  Administered 2021-09-02: 07:00:00 15 mL via OROMUCOSAL
  Filled 2021-09-02: qty 15

## 2021-09-02 MED ORDER — RENA-VITE PO TABS
1.0000 | ORAL_TABLET | Freq: Every day | ORAL | Status: DC
Start: 2021-09-02 — End: 2021-09-11
  Administered 2021-09-02 – 2021-09-10 (×9): 1
  Filled 2021-09-02 (×10): qty 1

## 2021-09-02 MED ORDER — LEVETIRACETAM IN NACL 1500 MG/100ML IV SOLN
1500.0000 mg | Freq: Once | INTRAVENOUS | Status: AC
  Administered 2021-09-02: 10:00:00 1500 mg via INTRAVENOUS
  Filled 2021-09-02: qty 100

## 2021-09-02 MED ORDER — INSULIN ASPART 100 UNIT/ML IJ SOLN
0.0000 [IU] | INTRAMUSCULAR | Status: DC
  Administered 2021-09-02 (×2): 5 [IU] via SUBCUTANEOUS
  Administered 2021-09-02 – 2021-09-03 (×2): 3 [IU] via SUBCUTANEOUS
  Administered 2021-09-03: 7 [IU] via SUBCUTANEOUS
  Administered 2021-09-03: 2 [IU] via SUBCUTANEOUS
  Administered 2021-09-03: 3 [IU] via SUBCUTANEOUS
  Administered 2021-09-03: 5 [IU] via SUBCUTANEOUS
  Administered 2021-09-03: 3 [IU] via SUBCUTANEOUS
  Administered 2021-09-03: 5 [IU] via SUBCUTANEOUS
  Administered 2021-09-04: 3 [IU] via SUBCUTANEOUS
  Administered 2021-09-04: 2 [IU] via SUBCUTANEOUS
  Administered 2021-09-04: 3 [IU] via SUBCUTANEOUS
  Administered 2021-09-04: 2 [IU] via SUBCUTANEOUS
  Administered 2021-09-05: 7 [IU] via SUBCUTANEOUS
  Administered 2021-09-05: 5 [IU] via SUBCUTANEOUS
  Administered 2021-09-05: 7 [IU] via SUBCUTANEOUS
  Administered 2021-09-05: 5 [IU] via SUBCUTANEOUS

## 2021-09-02 MED ORDER — SODIUM ZIRCONIUM CYCLOSILICATE 10 G PO PACK
10.0000 g | PACK | Freq: Once | ORAL | Status: AC
  Administered 2021-09-02: 11:00:00 10 g

## 2021-09-02 MED ORDER — INSULIN DETEMIR 100 UNIT/ML ~~LOC~~ SOLN
10.0000 [IU] | Freq: Two times a day (BID) | SUBCUTANEOUS | Status: DC
  Administered 2021-09-02 – 2021-09-03 (×3): 10 [IU] via SUBCUTANEOUS
  Filled 2021-09-02 (×4): qty 0.1

## 2021-09-02 MED ORDER — DEXMEDETOMIDINE HCL IN NACL 400 MCG/100ML IV SOLN: 0.4000 ug/kg/h | INTRAVENOUS | Status: DC

## 2021-09-02 MED ORDER — PROSOURCE TF PO LIQD
45.0000 mL | Freq: Every day | ORAL | Status: DC
  Administered 2021-09-02 – 2021-09-11 (×10): 45 mL
  Filled 2021-09-02 (×10): qty 45

## 2021-09-02 MED ORDER — VITAL 1.5 CAL PO LIQD
1000.0000 mL | ORAL | Status: DC
  Administered 2021-09-02 – 2021-09-11 (×8): 1000 mL
  Filled 2021-09-02 (×3): qty 1000

## 2021-09-02 NOTE — Progress Notes (Signed)
Initial Nutrition Assessment  DOCUMENTATION CODES:   Severe malnutrition in context of chronic illness  INTERVENTION:   Initiate tube feeds via OG tube: - Start Vital 1.5 @ 20 ml/hr and advance by 10 ml q 6 hours to goal rate of 50 ml/hr (1200 ml/day) - ProSource TF 45 ml daily  Tube feeding regimen provides 1840 kcal, 92 grams of protein, and 917 ml of H2O.   - Renal MVI daily per tube  NUTRITION DIAGNOSIS:   Severe Malnutrition related to chronic illness (ESRD) as evidenced by severe muscle depletion, severe fat depletion.  GOAL:   Patient will meet greater than or equal to 90% of their needs  MONITOR:   Vent status, Labs, Weight trends, TF tolerance, I & O's  REASON FOR ASSESSMENT:   Ventilator, Consult Enteral/tube feeding initiation and management  ASSESSMENT:   75 year old male who presented to the ED on 11/16 with seizures. 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 Pt recently discharged on 10/27 on a regimen of dilantin and keppra. Pt required intubation for airway protection.  Discussed pt with RN and during ICU rounds. Pt unresponsive off sedation. Consult received for tube feeding initiation and management. Pt with OG tube in place. Will start tube feeds at trickle rate and slowly advance to goal. RN aware of plan. Per RN, no orders to restart propofol.  Last HD was last night with 1500 ml net UF. Post-HD weight was 67 kg. Per Nephrology note, pt's EDW is 65.5 kg. Reviewed weight history in chart. Pt with a 6.3 kg weight loss since 01/12/21. This is an 8.6% weight loss 8 months which is not quite significant for timeframe but is concerning. Noted pt's lowest weight was 61.1 kg on 06/08/21. Pt with some weight gain since that time.  Patient is currently intubated on ventilator support MV: 9.2 L/min Temp (24hrs), Avg:97.8 F (36.6 C), Min:97.6 F (36.4 C), Max:98.2 F (36.8 C)  Drips: Propofol: off  this AM  Medications reviewed and include: colace, SSI q 4 hours, levemir 10 units BID, IV protonix, IV dilantin, miralax  Labs reviewed: sodium 133, potassium 6.1, ionized calcium 1.12 CBG's: 95-259 x 24 hours  I/O's: -718 ml since admit  NUTRITION - FOCUSED PHYSICAL EXAM:  Flowsheet Row Most Recent Value  Orbital Region Severe depletion  Upper Arm Region Severe depletion  Thoracic and Lumbar Region Severe depletion  Buccal Region Unable to assess  Temple Region Moderate depletion  Clavicle Bone Region Severe depletion  Clavicle and Acromion Bone Region Severe depletion  Scapular Bone Region Moderate depletion  Dorsal Hand Moderate depletion  Patellar Region Moderate depletion  Anterior Thigh Region Severe depletion  Posterior Calf Region Severe depletion  Edema (RD Assessment) None  Hair Reviewed  Eyes Unable to assess  Mouth Unable to assess  Skin Reviewed  Nails Reviewed       Diet Order:   Diet Order             Diet NPO time specified  Diet effective now                   EDUCATION NEEDS:   Not appropriate for education at this time  Skin:  Skin Assessment: Skin Integrity Issues: Incisions: L arm  Last BM:  09/06/2021 large type 6  Height:   Ht Readings from Last 1 Encounters:  08/31/2021 5\' 10"  (1.778 m)    Weight:   Wt Readings from  Last 1 Encounters:  09/02/21 67 kg    BMI:  Body mass index is 21.19 kg/m.  Estimated Nutritional Needs:   Kcal:  1700-1900  Protein:  90-105 grams  Fluid:  1000 ml + UOP    Gustavus Bryant, MS, RD, LDN Inpatient Clinical Dietitian Please see AMiON for contact information.

## 2021-09-02 NOTE — Progress Notes (Signed)
[facility] Progress Note  Subjective: seen in ICU, on vent and sedated  Vitals:   09/02/21 1111 09/02/21 1134 09/02/21 1200 09/02/21 1217  BP:  (!) 149/68 (!) 145/75   Pulse:  90 94 94  Resp:  _0 Temp: 97.7 F (36.5 C)     TempSrc: Oral     SpO2:  100% 100% 100%  Weight:      Height:        Exam: on vent ,sedated  no jvd  throat ett in place  Chest cta bilat and lat  Cor reg no RG  Abd soft ntnd no ascites   Ext no LE edema   Neuro on vent and sedated, not following commands   RIJ TDC/ LUA AVF +bruit   Home meds include - asa, lipitor, diltiazem 180, hydralazine 25 tid, norco prn, keppra 1562m bid + 506mpost HD mwf, dilantin 75 tid, renvela 3 ac tid, sod bicarb 650 bid, tradjenta 5 qd, tresiba 22u qd, prns/ vits/ supps   67.6kg    OP HD: MWF [facility]   3h 4554m 400/1.5   2/2 bath  65.5kg  Hep 2000  RIJ TDC / LUA BC AVF placed 08/25/21 (Dr ClaCarlis Abbott - venofer 100 x 10  - mircera 60 q2, last 11/2       Assessment/ Plan: Status epilepticus - per CCM/ neuro, intubated, propofol gtt to be tapered down. EEG monitoring. AED's per neuro.  VDRF - per CCM Hyperkalemia - K+ 6.1 today, not sure accurate because just had HD overnight. Will repeat at 2 pm.  HTN emergency - BP's high on admit, much better today. Getting home hydralazine and diltiazem via NG, prn IV labetalol.  Volume - 1500 off w/ HD yesterday. Up 1-2 kg here.  ESRD - on HD MWF. Next HD Friday.  DM uncont - sp IV insulin gtt, back on SQ insulin now.   Anemia ckd - 12, no esa needs for now MBD ckd - resume binder when eating /off the vent     Rob Meighan Treto 09/02/2021, 12:34 PM   Recent Labs  Lab 08/31/2021 1552 09/10/2021 1723 08/22/2021 2141 09/02/21 0811  K 4.7 4.5 5.3* 6.1*  BUN  --  47* 51* 19  CREATININE  --  8.48*  8.44* 8.93* 5.30*  CALCIUM  --  9.0 9.3 9.0  PHOS  --   --   --  3.0  HGB 12.6* 12.2*  --   --    Inpatient medications:  aspirin  81 mg Per Tube Daily    atorvastatin  40 mg Per Tube Daily   chlorhexidine gluconate (MEDLINE KIT)  15 mL Mouth Rinse BID   Chlorhexidine Gluconate Cloth  6 each Topical Q0600   diltiazem  45 mg Per Tube Q6H   docusate  100 mg Per Tube BID   heparin  5,000 Units Subcutaneous Q8H   hydrALAZINE  25 mg Per Tube Q8H   insulin aspart  0-9 Units Subcutaneous Q4H   insulin detemir  10 Units Subcutaneous Q12H   mouth rinse  15 mL Mouth Rinse 10 times per day   pantoprazole (PROTONIX) IV  40 mg Intravenous Daily   phenytoin (DILANTIN) IV  100 mg Intravenous Q8H   polyethylene glycol  17 g Per Tube Daily   sodium zirconium cyclosilicate  10 g Per Tube Once    dexmedetomidine (PRECEDEX) IV infusion     levETIRAcetam     propofol (DIPRIVAN) infusion Stopped (  09/02/21 1140)   dextrose, hydrALAZINE, labetalol, midazolam, midazolam, sodium chloride flush

## 2021-09-02 NOTE — Progress Notes (Signed)
Pt placed on PS/CPAP 10/5 & 40% and is tolerating well. RT will continue to monitor.

## 2021-09-02 NOTE — Progress Notes (Signed)
LTM maint complete - no skin breakdown under: Fp2 F4 A2 [facility] monitored, Event button test confirmed by [facility].

## 2021-09-02 NOTE — Progress Notes (Signed)
NAME:  THURMON MIZELL, MRN:  [id number], DOB:  [birth year], LOS: 1 ADMISSION DATE:  09/08/2021, CONSULTATION DATE:  09/13/2021 REFERRING MD:  Dr. Matilde Sprang, CHIEF COMPLAINT:  Seizures  Brief summary:   Mr. Willert is a 75 year old male who presented to the ED via EMS 11/16 as a code stroke.  Last known normal 11/15 around 8 PM.  He was found at his recliner 11/16 with left gaze, left facial twitching, unresponsive.  In ED 11/16, was found to be in hypertensive emergency with sonorous respirations.  Intubated, placed on propofol gtt.  Neurology and nephrology consulted.  Overnight EEG demonstrates profound diffuse encephalopathy likely related to sedation and right hemisphere epileptogenicity; however without definite seizures recorded.  Blood pressures returned to normal range by 4 PM (4 hours after admission with hypertensive emergency).  Was found to have soft BP 1845-0115, propofol drip adjusted by E-link with subsequent return to normotension.   Pertinent  Medical History  Seizures, ESRD on MWF iHD, DM- uncontrolled, uncontrolled HTN, TBI (1970s), HLD  Significant Hospital Events: Including procedures, antibiotic start and stop dates in addition to other pertinent events   11/16: Admitted, intubated, sedated on propofol 11/17: O/n EEG demonstrates profound diffuse encephalopathy and right hemisphere epileptogenicity  Interim History / Subjective:  Patient sedated and intubated.  No acute distress.  Intermittent left arm movements, right left hand the chest and placing back down.  Unable to follow any commands due to sedation.  Objective   Blood pressure 115/62, pulse 84, temperature 98.2 F (36.8 C), temperature source Oral, resp. rate 16, height 5\' 10"  (1.778 m), weight 67 kg, SpO2 100 %.    Vent Mode: PSV;CPAP FiO2 (%):  [40 %-100 %] 40 % Set Rate:  [16 bmp-24 bmp] 16 bmp Vt Set:  [580 mL] 580 mL PEEP:  [5 cmH20] 5 cmH20 Pressure Support:  [10 cmH20] 10 cmH20 Plateau Pressure:   [15 cmH20-20 cmH20] 19 cmH20   Intake/Output Summary (Last 24 hours) at 09/02/2021 0945 Last data filed at 09/02/2021 0800 Gross per 24 hour  Intake 533.52 ml  Output 1500 ml  Net -966.48 ml   Filed Weights   08/30/2021 1519 09/02/21 0030 09/02/21 0408  Weight: 67.6 kg 68.6 kg 67 kg   Examination: General: Thin cachectic appearing male, sedated and intubated, no acute distress Cardiac: Regular rate and rhythm, no murmurs appreciated Respiratory: CTA B Abdomen: Soft, nondistended, bowel sounds present Extremities: No BLE edema Neuro: Unable to follow commands, intermittent left arm movement of hand to chest, concordant gaze  Assessment & Plan:   Status Epilepticus; improving Overnight EEG demonstrates profound diffuse encephalopathy likely related to sedation and right hemisphere epileptogenicity; however without definite seizures recorded.   - Dilantin 100 mg IV every 8 - Keppra load 1500 mg IV at 095 - plan to wean sedation (precedex and propofol) while on continuous EEG - appreciate continued neuro evaluation and recommendations  Respiratory insufficiency  Weaned this morning by RT to PS/CPAP 10/5 and 40%. Morning CXR shows increased small left pleural effusion.  - continue vent support - Bronchial hygiene  Hypertensive emergency; improving Blood pressures returned to normal range by 4 PM (4 hours after admission with hypertensive emergency).  Was found to have soft BP 1845-0115, propofol drip adjusted by E-link with subsequent return to normotension.  - home diltiazem 45 mg per tube q6 hr restarted by nephro 11/16 - Hydralazine 5 mg per tube every 8 - continue PRN Labetalol and Hydralazine for goal SBP ~ 170. - continue  holding home antihypertensives   Type 2 diabetes S/p probable HHS. Overnight, transitioned off insulin ggt by E-link. Glucose overnight 93, AM 214.  - continue insulin detemir 10 units every 12 - CBG q4 - continue sensitive sliding scale  insulin  Hyperkalemia Mild hyperkalemia on admission. S/p lokema x1 o/n. AM K+ 6.1, increased from 5.3 yesterday despite insulin ggt overnight for hyperglycemia. EKG without peaked T waves.  - lokelma 10 mg x1 now - repeat BMP at 5pm - if EKG changes, can consider calcium gluconate, albuterol, and bicarb - daily BMP   Best Practice (right click and "Reselect all SmartList Selections" daily)   Diet/type: NPO DVT prophylaxis: prophylactic heparin  GI prophylaxis: PPI Lines: N/A Foley:  N/A Code Status:  full code Last date of multidisciplinary goals of care discussion: None yet   Ezequiel Essex, MD Family Medicine PGY-2 [facility] For pager details, please see AMION or use Epic chat  After 1900, please call [facility] for cross coverage needs 09/02/2021, 9:45 AM

## 2021-09-02 NOTE — Procedures (Addendum)
Patient Name: Jonathan Kane  MRN: [id number]  Epilepsy Attending: Lora Havens  Referring Physician/Provider: Dr Rosalin Hawking Duration: 09/02/2021 1659 to 09/02/2021 1659   Patient history:  75 y.o. 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 Krista Blue at [facility] presented to ED for forced left gaze and left facial twitching, mouth smacking, also has intermittent left UE jerking. RUE flexed with tone and intermittent right foot twitching. EEG to evaluate for seizure.    Level of alertness:  comatose   AEDs during EEG study: PHT, propofol   Technical aspects: This EEG study was done with scalp electrodes positioned according to the 10-20 International system of electrode placement. Electrical activity was acquired at a sampling rate of 500Hz  and reviewed with a high frequency filter of 70Hz  and a low frequency filter of 1Hz . EEG data were recorded continuously and digitally stored.    Description: EEG showed burst suppression pattern with bursts of sharply contoured 5-9hz  theta-alpha activity lasting 3-4 seconds alternating with generalized eeg suppression lasting 3-6 seconds.  Lateralized periodic discharges with overriding fast activity ( LPD+) were also noted in right hemisphere, maximal right posterior quadrant with frequency fluctuating between every 1 to 5 seconds.  Hyperventilation and photic stimulation were not performed.      ABNORMALITY - Burst suppression, generalized -Lateralized periodic discharges with overriding fast activity, right hemisphere, maximal right posterior quadrant ( LPD+)   IMPRESSION: This study showed evidence of epileptogenicity arising from right hemisphere, maximal right posterior quadrant which is on the ictal-interictal continuum with high potential for seizure recurrence.  Additionally there is profound diffuse encephalopathy,  nonspecific etiology but likely related to sedation. No definite seizures were seen throughout the recording.   Powell Halbert [name]

## 2021-09-02 NOTE — Progress Notes (Signed)
LTM EEG maint complete.

## 2021-09-02 NOTE — Progress Notes (Signed)
Pt placed back on full support due to low minute ventilation. RT will continue to monitor.

## 2021-09-02 NOTE — Progress Notes (Addendum)
Subjective: No further clinical seizures overnight  ROS: Unable to obtain due to poor mental status  Examination  Vital signs in last 24 hours: Temp:  [97.1 F (36.2 C)-98.2 F (36.8 C)] 97.7 F (36.5 C) (11/17 1111) Pulse Rate:  [79-118] 90 (11/17 1134) Resp:  [7-36] 16 (11/17 1134) BP: (68-265)/(46-143) 149/68 (11/17 1134) SpO2:  [93 %-100 %] 100 % (11/17 1134) FiO2 (%):  [40 %-100 %] 40 % (11/17 1134) Weight:  [67 kg-68.6 kg] 67 kg (11/17 0408)  General: lying in bed, NAD CVS: pulse-normal rate and rhythm RS: Intubated, coarse breath sounds bilaterally Extremities: Warm, no edema Neuro: On propofol @15mcg /hr, does not open eyes to noxious stimuli, does not follow commands, PERRLA, corneal reflex intact, gag reflex intact, withdraws to noxious stimuli in all 4 extremities so  Basic Metabolic Panel: Recent Labs  Lab 09/07/2021 1201 08/31/2021 1208 09/03/2021 1210 09/04/2021 1403 08/29/2021 1439 09/10/2021 1552 08/22/2021 1723 09/08/2021 2141 09/02/21 0811  NA 129* 129*   < > 127* 128* 133* 133* 133* 133*  K 6.0* 5.9*   < > 5.9* 5.4* 4.7 4.5 5.3* 6.1*  CL 92* 98  --  95*  --   --  97* 99 96*  CO2 22  --   --  17*  --   --  21* 17* 24  GLUCOSE 675* 683*  --  593*  --   --  359* 93 243*  BUN 47* 47*  --  47*  --   --  47* 51* 19  CREATININE 8.40* 8.80*  --  8.23*  --   --  8.48*  8.44* 8.93* 5.30*  CALCIUM 9.1  --   --  9.0  --   --  9.0 9.3 9.0  MG  --   --   --   --   --   --   --   --  2.1  PHOS  --   --   --   --   --   --   --   --  3.0   < > = values in this interval not displayed.    CBC: Recent Labs  Lab 08/24/2021 1201 08/23/2021 1208 08/23/2021 1210 08/26/2021 1439 09/10/2021 1552 08/18/2021 1723  WBC 6.0  --   --   --   --  6.6  NEUTROABS 4.4  --   --   --   --   --   HGB 12.9* 14.3 13.6 12.2* 12.6* 12.2*  HCT 38.3* 42.0 40.0 36.0* 37.0* 35.2*  MCV 93.4  --   --   --   --  90.0  PLT 152  --   --   --   --  150     Coagulation Studies: Recent Labs    08/27/2021 1201   LABPROT 14.6  INR 1.1    Imaging  CTH wo contrast 09/05/2021: No acute intracranial hemorrhage or infarct.  ASPECTS is 10.Unchanged encephalomalacia in the left frontal and temporal lobes related to prior insult.  CTA H/N 08/26/2021: No large vessel occlusion, hemodynamically significant stenosis, or evidence of dissection.   Perfusion imaging demonstrates small core infarction in the anterior left frontal lobe corresponding to chronic infarct prior non-contrast head CT.  MR Brain wo contrast 08/08/2021: Questionable small acute to subacute white matter infarct in the right frontal lobe, versus T2-shine-through in the setting of advanced underlying chronic white matter disease. No associated hemorrhage or mass effect. Otherwise continued stable MRI appearance of the brain.  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  ASSESSMENT: 75 year old male with bilateral frontotemporal anticoagulation who was recently admitted at the end of October for focal motor seizures and was discharged on Keppra and phenytoin.  He presented again with left upper extremity jerking and left facial twitching as well as forced left gaze deviation which improved with Ativan.  Of note, on arrival phenytoin level subtherapeutic, Keppra level was not checked.  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  Additionally there is profound diffuse encephalopathy, nonspecific etiology but likely related to sedation. No definite seizures were seen throughout the recording. -Etiology of breakthrough seizures: Phenytoin level was subtherapeutic on arrival.  Additionally patient had probable HHS as well  as hypertensive emergency which could have provoked his seizures  Recommendations -We will give one-time loading dose of Keppra 1500 mg twice daily and resume Keppra 750 mg twice daily -Continue phenytoin 100 mg 3 times daily -Okay to wean propofol at 5 MCG per hour to stop -Continue LTM EEG till tomorrow.  Patient remains seizure-free with recent MRI -We will discuss medication compliance with patient once he is awake -Management of rest of comorbidities per primary team  CRITICAL CARE Performed by: Lora Havens   Total critical care time: 35 minutes  Critical care time was exclusive of separately billable procedures and treating other patients.  Critical care was necessary to treat or prevent imminent or life-threatening deterioration.  Critical care was time spent personally by me on the following activities: development of treatment plan with patient and/or surrogate as well as nursing, discussions with consultants, evaluation of patient's response to treatment, examination of patient, obtaining history from patient or surrogate, ordering and performing treatments and interventions, ordering and review of laboratory studies, ordering and review of radiographic studies, pulse oximetry and re-evaluation of patient's condition.   Zeb Comfort Epilepsy [facility] For questions after 5pm please refer to AMION to reach the Neurologist on call

## 2021-09-03 ENCOUNTER — Inpatient Hospital Stay (HOSPITAL_COMMUNITY): Payer: Medicare Other

## 2021-09-03 DIAGNOSIS — J9601 Acute respiratory failure with hypoxia: Secondary | ICD-10-CM | POA: Diagnosis not present

## 2021-09-03 DIAGNOSIS — E43 Unspecified severe protein-calorie malnutrition: Secondary | ICD-10-CM | POA: Insufficient documentation

## 2021-09-03 DIAGNOSIS — G40901 Epilepsy, unspecified, not intractable, with status epilepticus: Secondary | ICD-10-CM | POA: Diagnosis not present

## 2021-09-03 DIAGNOSIS — N186 End stage renal disease: Secondary | ICD-10-CM | POA: Diagnosis not present

## 2021-09-03 LAB — URINALYSIS, ROUTINE W REFLEX MICROSCOPIC
Bilirubin Urine: NEGATIVE
Glucose, UA: >=500 mg/dL — AB
Ketones, ur: NEGATIVE mg/dL
Nitrite: NEGATIVE
Protein, ur: 100 mg/dL — AB
RBC / HPF: >50 RBC/hpf — ABNORMAL HIGH (ref 0–5)
Specific Gravity, Urine: 1.013 (ref 1.005–1.030)
pH: 8 (ref 5.0–8.0)

## 2021-09-03 LAB — BASIC METABOLIC PANEL
Anion gap: 16 — ABNORMAL HIGH (ref 5–15)
BUN: 33 mg/dL — ABNORMAL HIGH (ref 8–23)
CO2: 21 mmol/L — ABNORMAL LOW (ref 22–32)
Calcium: 8.8 mg/dL — ABNORMAL LOW (ref 8.9–10.3)
Chloride: 96 mmol/L — ABNORMAL LOW (ref 98–111)
Creatinine, Ser: 7.32 mg/dL — ABNORMAL HIGH (ref 0.61–1.24)
GFR, Estimated: 7 mL/min — ABNORMAL LOW (ref >60)
Glucose, Bld: 231 mg/dL — ABNORMAL HIGH (ref 70–99)
Potassium: 4.6 mmol/L (ref 3.5–5.1)
Sodium: 133 mmol/L — ABNORMAL LOW (ref 135–145)

## 2021-09-03 LAB — MAGNESIUM
Magnesium: 2.4 mg/dL (ref 1.7–2.4)
Magnesium: 2.4 mg/dL (ref 1.7–2.4)

## 2021-09-03 LAB — PHENYTOIN LEVEL, TOTAL: Phenytoin Lvl: 12 ug/mL (ref 10.0–20.0)

## 2021-09-03 LAB — HEPATITIS B SURFACE ANTIBODY, QUANTITATIVE: Hep B S AB Quant (Post): 30.7 m[IU]/mL (ref >9.9)

## 2021-09-03 LAB — GLUCOSE, CAPILLARY
Glucose-Capillary: 198 mg/dL — ABNORMAL HIGH (ref 70–99)
Glucose-Capillary: 219 mg/dL — ABNORMAL HIGH (ref 70–99)
Glucose-Capillary: 231 mg/dL — ABNORMAL HIGH (ref 70–99)
Glucose-Capillary: 246 mg/dL — ABNORMAL HIGH (ref 70–99)
Glucose-Capillary: 264 mg/dL — ABNORMAL HIGH (ref 70–99)
Glucose-Capillary: 268 mg/dL — ABNORMAL HIGH (ref 70–99)

## 2021-09-03 LAB — PHOSPHORUS
Phosphorus: 5.1 mg/dL — ABNORMAL HIGH (ref 2.5–4.6)
Phosphorus: 5.5 mg/dL — ABNORMAL HIGH (ref 2.5–4.6)

## 2021-09-03 IMAGING — MR MR HEAD W/O CM
9 of 13 series · 21 of 48 positions shown · non-contrast
Comparison: CT head [DATE]

CLINICAL DATA: Seizure, abnormal neuro exam

EXAM:
MRI HEAD WITHOUT CONTRAST
TECHNIQUE: Multiplanar, multiecho pulse sequences of the brain and surrounding
structures were obtained without intravenous contrast.

[Series 2: DWI · axial · 3.0mm · 0.94mm/px · z∈[-110,+41]mm · 4 of 107 slices shown (1 of 2)]
[im 1/107]
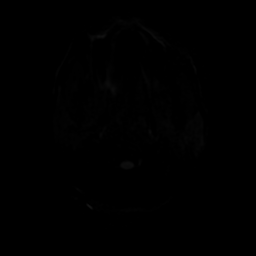
[im 36/107]
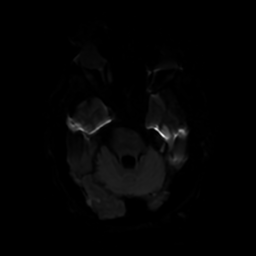
[im 71/107]
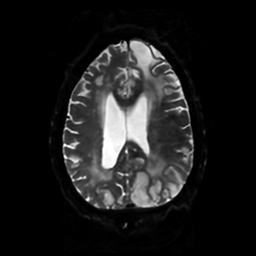
[im 107/107]
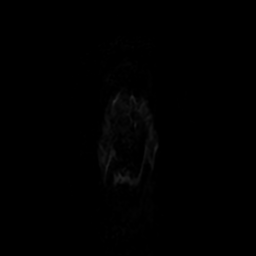

[Series 3: DWI · coronal · 4.0mm · 0.94mm/px · 4 of 78 slices shown (2 of 2)]
[im 1/78]
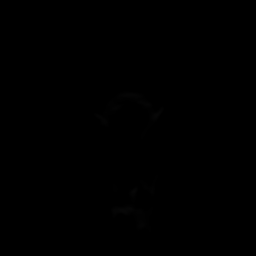
[im 26/78]
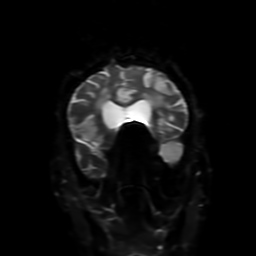
[im 52/78]
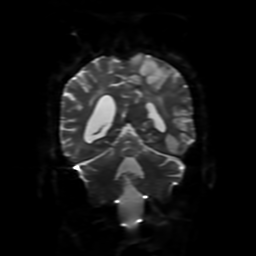
[im 78/78]
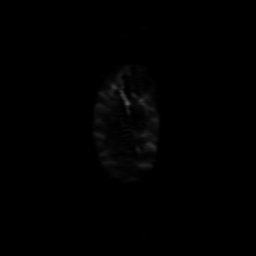

[Series 4: FLAIR · sagittal · 5.0mm · 0.23mm/px · 1 of 25 slices shown (1 of 3)]
[im 1/25]
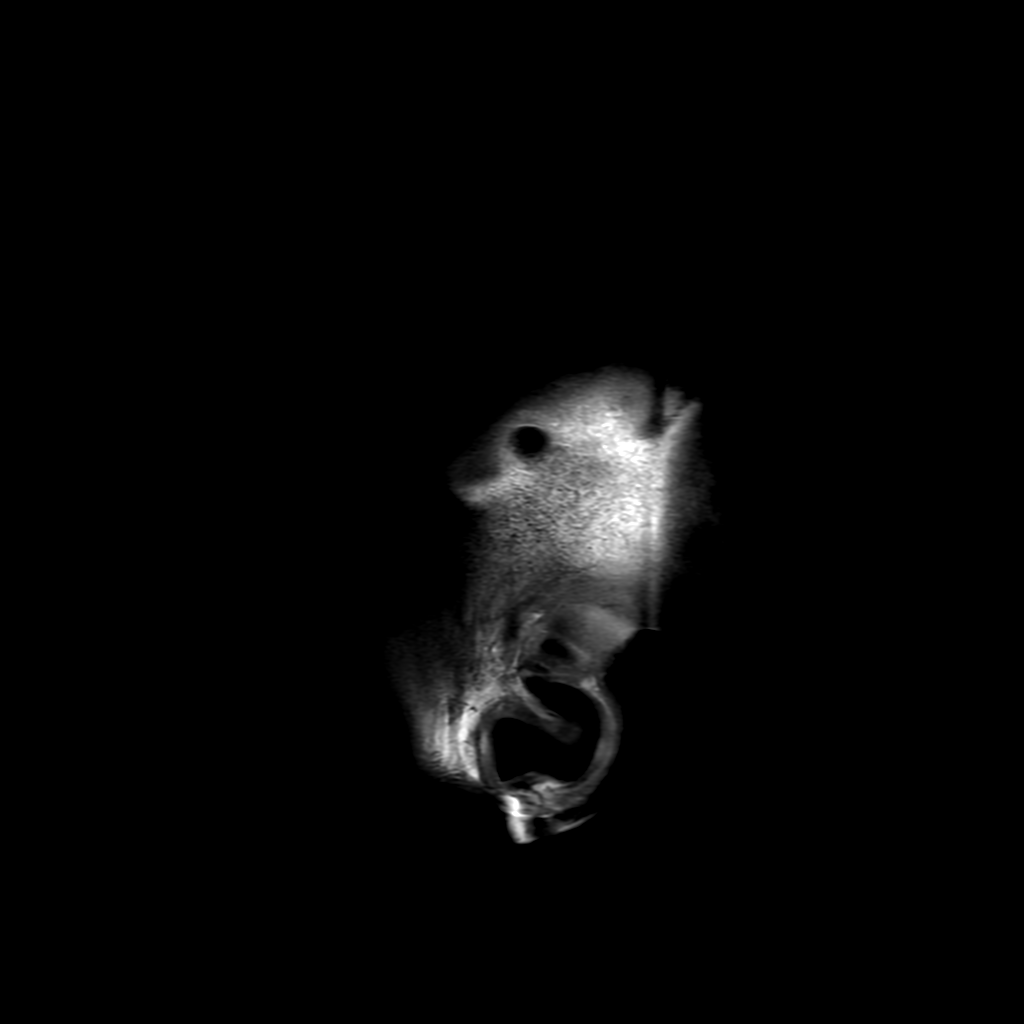

[Series 5: T2 · axial · 5.0mm · 0.23mm/px · 1 of 27 slices shown (1 of 2)]
[im 1/27]
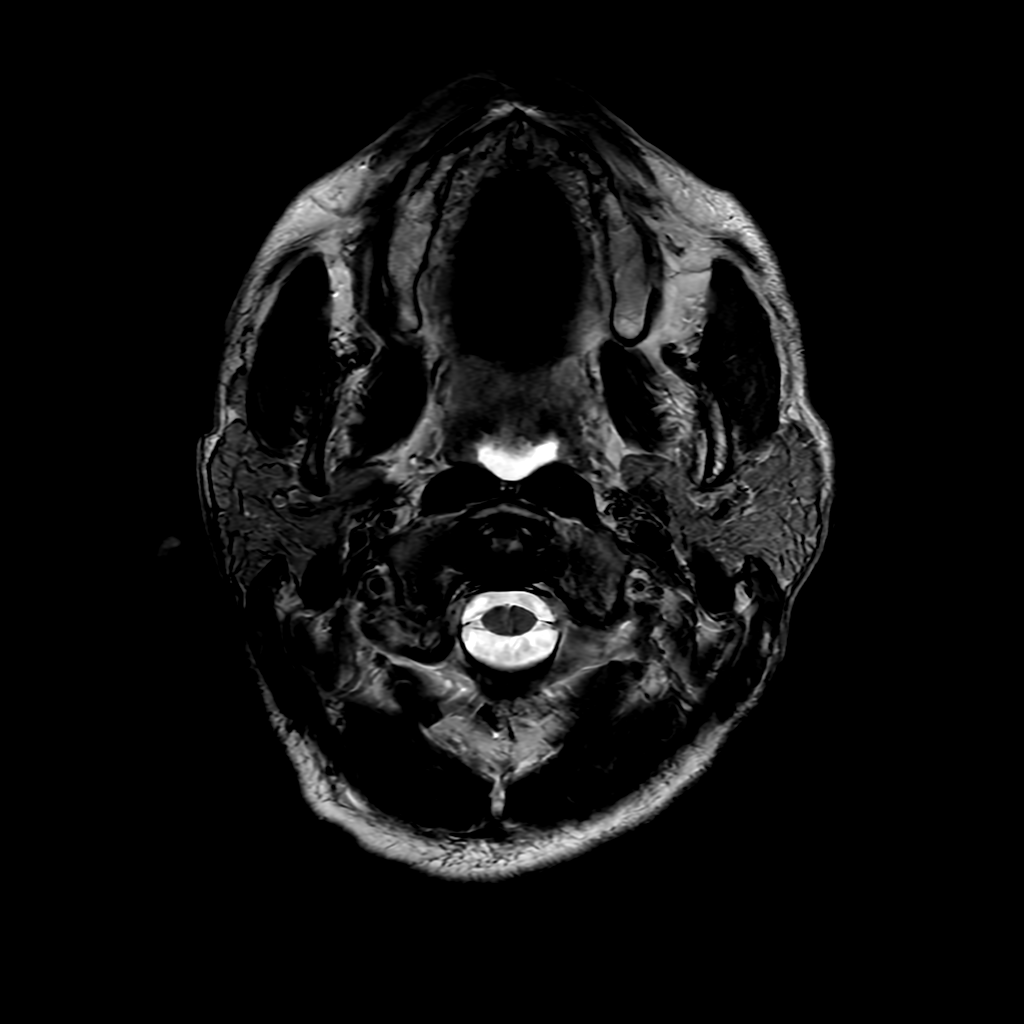

[Series 6: FLAIR · axial · 4.0mm · 0.45mm/px · z∈[-108,+43]mm · 2 of 37 slices shown (2 of 3)]
[im 1/37]
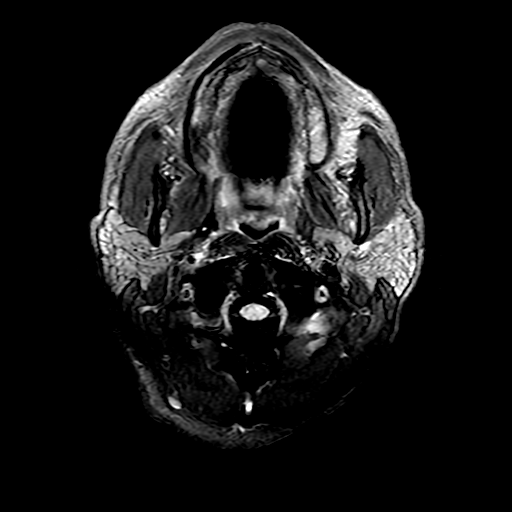
[im 37/37]
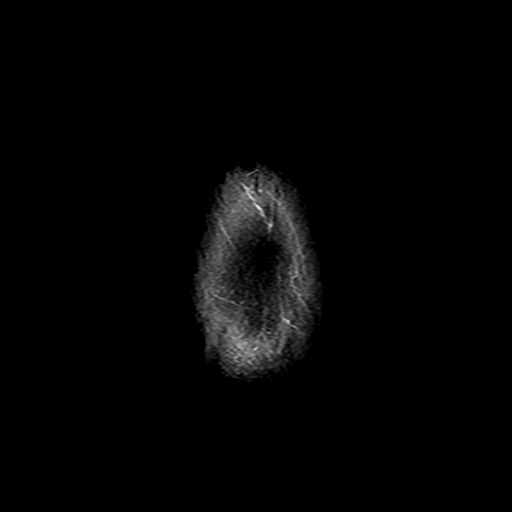

[Series 8: T2 · oblique · 3.0mm · 0.35mm/px · 2 of 38 slices shown (2 of 2)]
[im 1/38]
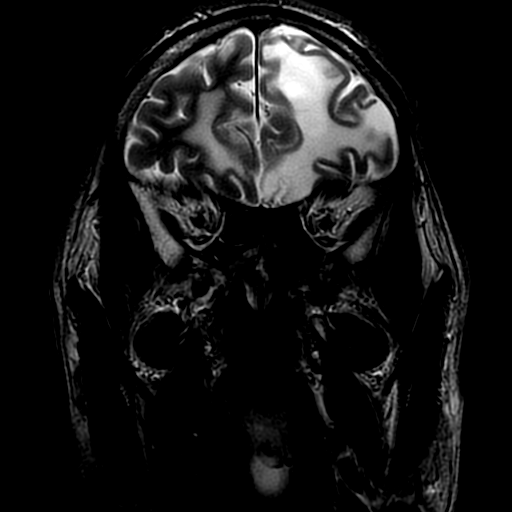
[im 38/38]
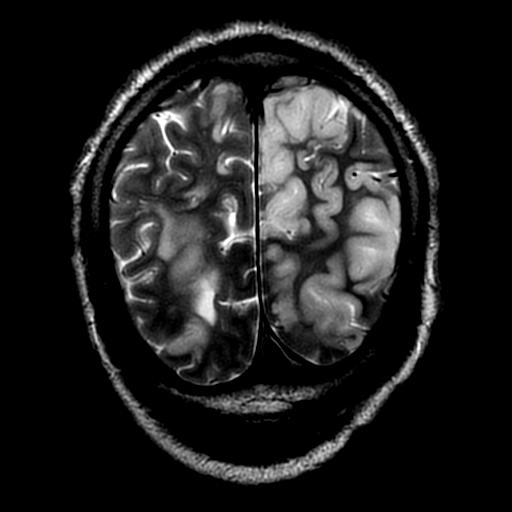

[Series 9: FLAIR · oblique · 3.0mm · 0.39mm/px · 2 of 38 slices shown (3 of 3)]
[im 1/38]
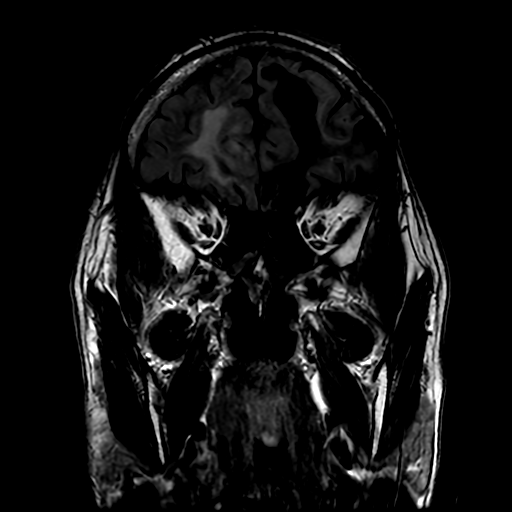
[im 38/38]
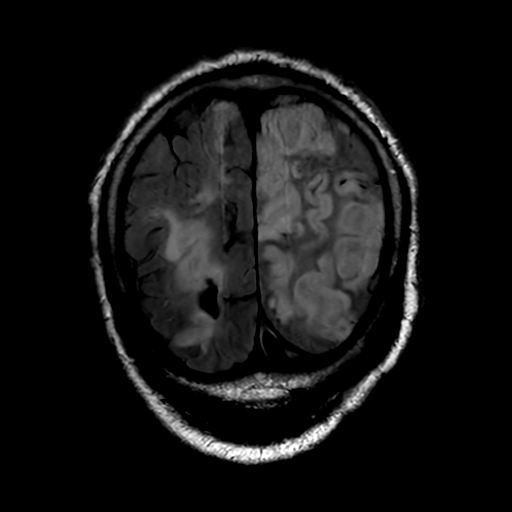

[Series 250: ADC · axial · 3.0mm · 0.94mm/px · z∈[-110,+41]mm · 3 of 54 slices shown (1 of 2)]
[im 1/54]
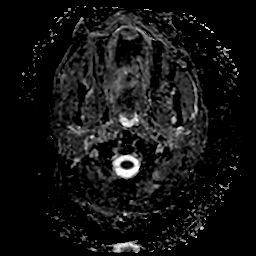
[im 27/54]
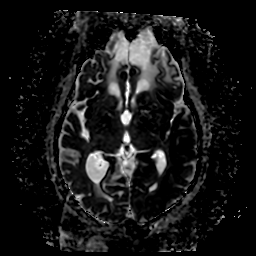
[im 54/54]
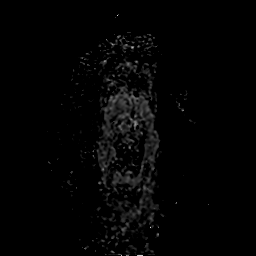

[Series 350: ADC · coronal · 4.0mm · 0.94mm/px · 2 of 39 slices shown (2 of 2)]
[im 1/39]
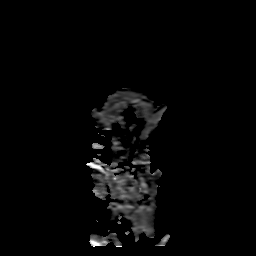
[im 39/39]
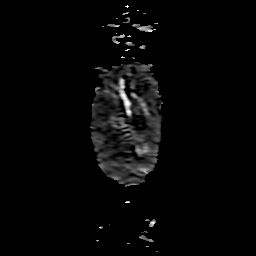

[21 of 48 positions shown; findings below may reference images not displayed]

FINDINGS: Brain: There is extensive cortically based diffusion restriction and
T2/FLAIR signal abnormality throughout the bilateral cerebral and
cerebellar hemispheres across multiple vascular distributions. There
is no evidence of associated hemorrhage. There is gyral swelling
effacement of the adjacent sulci.

Encephalomalacia in the bilateral frontal and left anterior temporal
lobes is again seen consistent with sequela of prior trauma. The
background of global parenchymal volume loss and ex vacuo dilatation
of the ventricular system is unchanged.

There is no solid mass lesion.  There is no midline shift.

Vascular: Normal flow voids.

Skull and upper cervical spine: Normal marrow signal.

Sinuses/Orbits: The imaged paranasal sinuses are clear. Bilateral
lens implants are in place. The globes and orbits are otherwise
unremarkable.

Other: Fluid is noted in the oropharynx likely related to
instrumentation.
IMPRESSION: 1. Extensive cortically based diffusion restriction and FLAIR signal
abnormality with gyral swelling throughout both cerebral and
cerebellar hemispheres. This finding is favored to reflect sequela
of seizures, with multiple acute infarcts or PRES considered less
likely.
2. Unchanged encephalomalacia in the bilateral anterior frontal and
left anterior temporal lobes consistent with sequela of prior
trauma.

## 2021-09-03 IMAGING — DX DG CHEST 1V PORT
1 series · 1 of 1 positions shown · non-contrast
Comparison: Previous studies including the examination of
[DATE]

CLINICAL DATA: Shortness of breath

EXAM:
PORTABLE CHEST 1 VIEW

[chest]
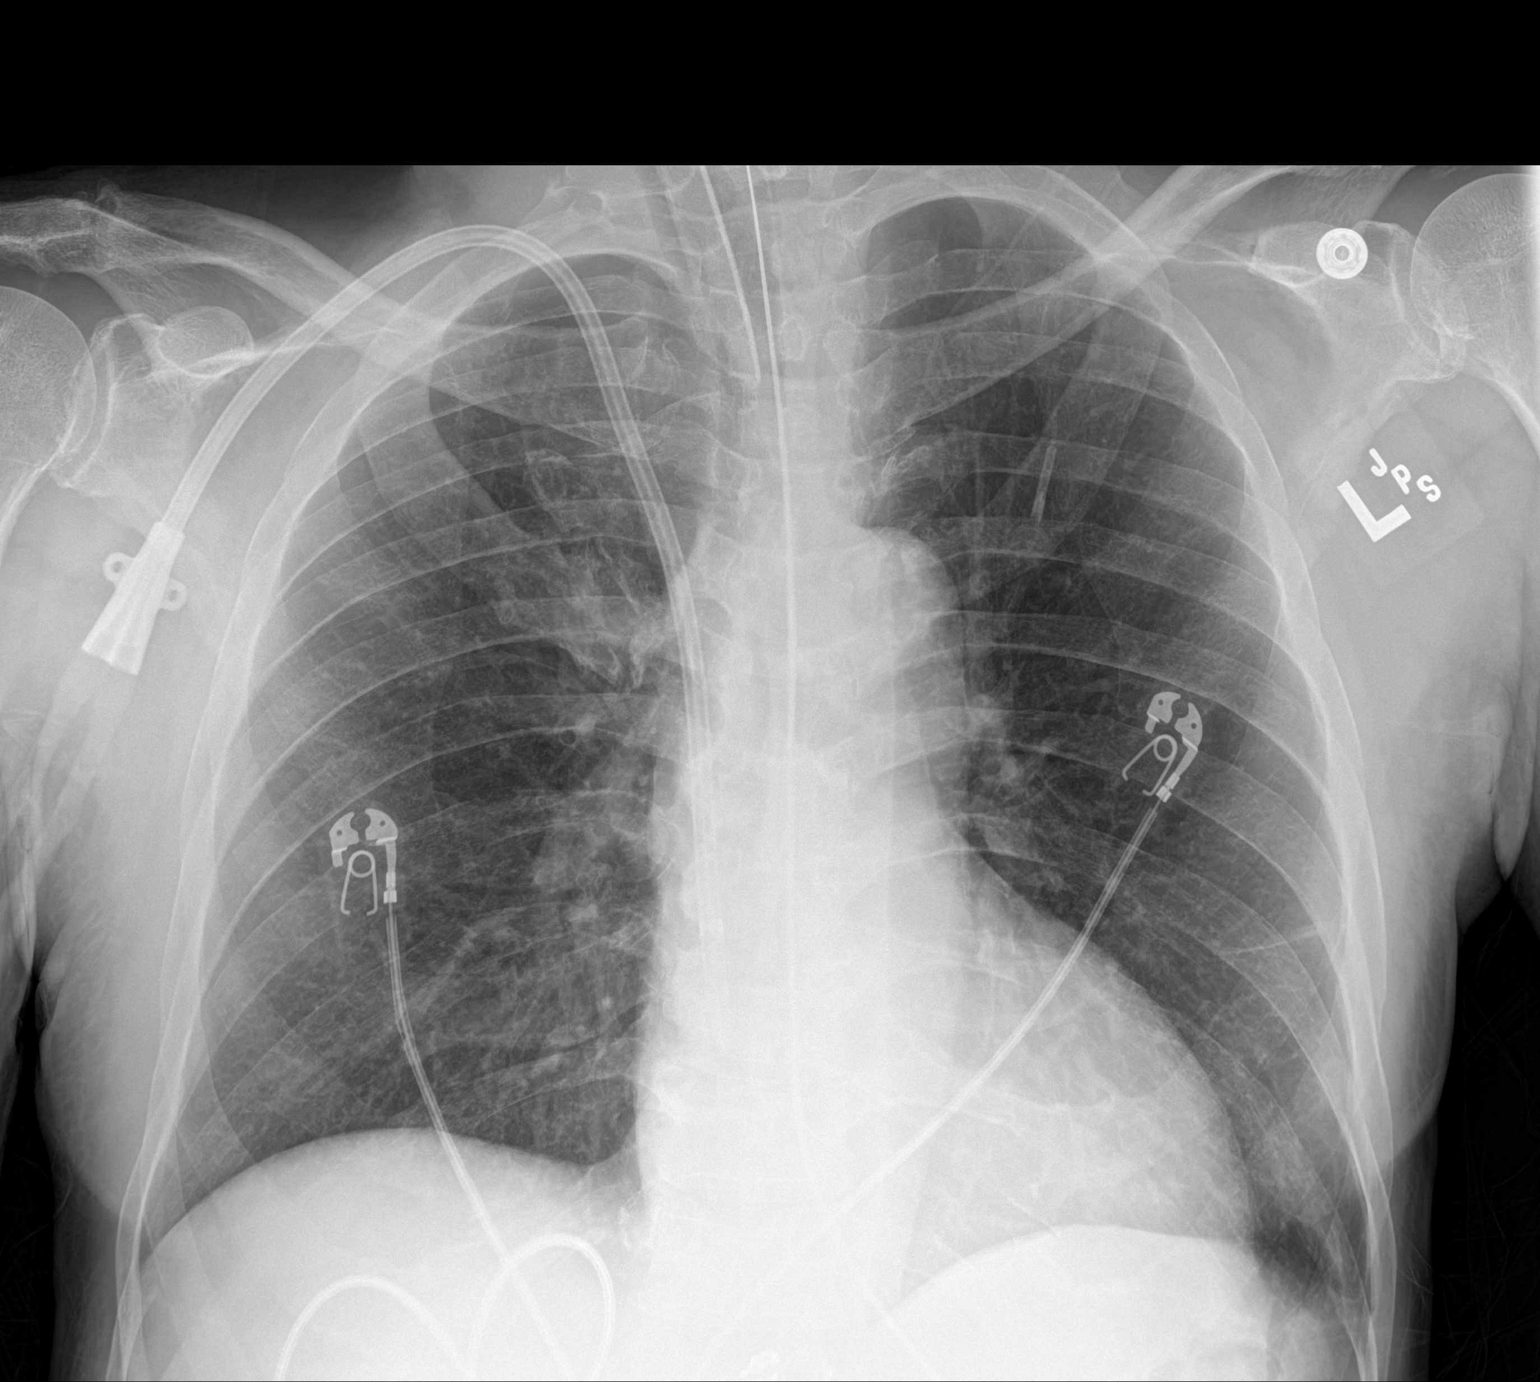

[1 of 1 positions shown; findings below may reference images not displayed]

FINDINGS: Cardiac size is within normal limits. Mediastinum is unremarkable.
There are no signs of pulmonary edema or new focal infiltrates.
Small transverse linear density in the left lower lung fields may
suggest scarring or subsegmental atelectasis. There is no
significant pleural effusion or pneumothorax. Tip of endotracheal
tube is approximately 8.3 cm above the carina. Tip of dialysis
catheter is seen in the right [facility]rium.
IMPRESSION: There are no new infiltrates or signs of pulmonary edema.

## 2021-09-03 MED ORDER — SODIUM CHLORIDE 0.9 % IV SOLN: INTRAVENOUS | Status: DC | PRN

## 2021-09-03 MED ORDER — AMLODIPINE BESYLATE 5 MG PO TABS: 5.0000 mg | ORAL_TABLET | Freq: Every day | ORAL | Status: DC

## 2021-09-03 MED ORDER — ACETAMINOPHEN 160 MG/5ML PO SOLN
650.0000 mg | ORAL | Status: AC | PRN
  Administered 2021-09-03 (×2): 650 mg
  Filled 2021-09-03 (×2): qty 20.3

## 2021-09-03 MED ORDER — FOSPHENYTOIN SODIUM 500 MG PE/10ML IJ SOLN
250.0000 mg | Freq: Once | INTRAMUSCULAR | Status: AC
  Administered 2021-09-03: 250 mg via INTRAVENOUS
  Filled 2021-09-03: qty 5

## 2021-09-03 MED ORDER — INSULIN DETEMIR 100 UNIT/ML ~~LOC~~ SOLN
15.0000 [IU] | Freq: Two times a day (BID) | SUBCUTANEOUS | Status: DC
  Administered 2021-09-03: 15 [IU] via SUBCUTANEOUS
  Filled 2021-09-03 (×3): qty 0.15

## 2021-09-03 MED ORDER — SODIUM CHLORIDE 0.9 % IV SOLN
125.0000 mg | Freq: Three times a day (TID) | INTRAVENOUS | Status: DC
  Administered 2021-09-03 – 2021-09-08 (×16): 125 mg via INTRAVENOUS
  Filled 2021-09-03 (×22): qty 2.5

## 2021-09-03 MED ORDER — INSULIN ASPART 100 UNIT/ML IJ SOLN
3.0000 [IU] | INTRAMUSCULAR | Status: DC
  Administered 2021-09-03 – 2021-09-07 (×23): 3 [IU] via SUBCUTANEOUS

## 2021-09-03 MED ORDER — STERILE WATER FOR INJECTION IJ SOLN
INTRAMUSCULAR | Status: AC
  Administered 2021-09-03: 10 mL
  Filled 2021-09-03: qty 10

## 2021-09-03 MED ORDER — HYDRALAZINE HCL 50 MG PO TABS
50.0000 mg | ORAL_TABLET | Freq: Three times a day (TID) | ORAL | Status: DC
  Administered 2021-09-03 – 2021-09-04 (×3): 50 mg
  Filled 2021-09-03 (×3): qty 1

## 2021-09-03 NOTE — Progress Notes (Addendum)
Subjective: Has been febrile overnight with SBP around 175.  ROS: Unable to obtain due to poor mental status  Examination  Vital signs in last 24 hours: Temp:  [97.7 F (36.5 C)-101.2 F (38.4 C)] 98.5 F (36.9 C) (11/18 0337) Pulse Rate:  [84-107] 86 (11/18 0400) Resp:  [14-26] 14 (11/18 0400) BP: (91-185)/(52-92) 159/76 (11/18 0400) SpO2:  [93 %-100 %] 100 % (11/18 0400) FiO2 (%):  [40 %] 40 % (11/18 0346) Weight:  [68.2 kg] 68.2 kg (11/18 0416)  General: lying in bed, NAD CVS: pulse-normal rate and rhythm RS: Intubated, coarse breath sounds bilaterally Extremities: Warm, no edema Neuro: opens eyes to noxious stimuli only, does not follow commands, PERRLA, corneal reflex intact, gag reflex intact, withdraws to noxious stimuli in all 4 extremities  Basic Metabolic Panel: Recent Labs  Lab 09/08/2021 1403 08/31/2021 1439 08/30/2021 1552 08/27/2021 1723 09/03/2021 2141 09/02/21 0811 09/02/21 1428 09/02/21 1638  NA 127*   < > 133* 133* 133* 133* 132*  --   K 5.9*   < > 4.7 4.5 5.3* 6.1* 6.0*  --   CL 95*  --   --  97* 99 96* 97*  --   CO2 17*  --   --  21* 17* 24 19*  --   GLUCOSE 593*  --   --  359* 93 243* 262*  --   BUN 47*  --   --  47* 51* 19 24*  --   CREATININE 8.23*  --   --  8.48*  8.44* 8.93* 5.30* 6.16*  --   CALCIUM 9.0  --   --  9.0 9.3 9.0 9.0  --   MG  --   --   --   --   --  2.1  --  2.3  PHOS  --   --   --   --   --  3.0  --  4.5   < > = values in this interval not displayed.    CBC: Recent Labs  Lab 08/22/2021 1201 08/17/2021 1208 08/23/2021 1210 08/19/2021 1439 09/12/2021 1552 08/21/2021 1723  WBC 6.0  --   --   --   --  6.6  NEUTROABS 4.4  --   --   --   --   --   HGB 12.9* 14.3 13.6 12.2* 12.6* 12.2*  HCT 38.3* 42.0 40.0 36.0* 37.0* 35.2*  MCV 93.4  --   --   --   --  90.0  PLT 152  --   --   --   --  150     Coagulation Studies: Recent Labs    08/20/2021 1201  LABPROT 14.6  INR 1.1    Imaging No new brain imaging   ASSESSMENT AND PLAN:  75 year old male with bilateral frontotemporal anticoagulation who was recently admitted at the end of October for focal motor seizures and was discharged on Keppra and phenytoin.  He presented again with left upper extremity jerking and left facial twitching as well as forced left gaze deviation which improved with Ativan.  Of note, on arrival phenytoin level subtherapeutic, Keppra level was not checked.   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  Additionally patient had probable HHS as well as hypertensive emergency which could have provoked his seizures -He continues to be encephalopathic (only opening eyes to noxious stimuli) even after being  off sedation.  Could be due to toxic abdomen bowel causes (uremia), possible infection in the setting of fever   Recommendations - Load with fosphenytoin 200mg  once and increase phenytoin to 125 mg 3 times daily.  -Continue Keppra 750 mg twice daily ( renally dosed) -If seizures persist, can load with Vimpat -We will unhook briefly from EEG to obtain MRI brain without contrast (cannot administer MRI contrast due to ESRD) -Continue LTM EEG until patient remains seizure-free for about 24 hours -No suspicion for meningitis.  We discussed with critical care team regarding infectious work-up -Management of rest of comorbidities per primary team  I have spent a total of  36  minutes with the patient reviewing hospital notes,  test results, labs and examining the patient as well as establishing an assessment and plan that was discussed personally with the patient.  > 50% of time was spent in direct patient care.    Zeb Comfort Epilepsy [facility] For questions after 5pm please refer to AMION to reach the Neurologist on call

## 2021-09-03 NOTE — Progress Notes (Signed)
Jonathan Kane Progress Note  Subjective: seen in ICU, off sedation but not waking up  Vitals:   09/03/21 1230 09/03/21 1245 09/03/21 1350 09/03/21 1351  BP:   (!) 183/84 (!) 183/84  Pulse: (!) 102 90    Resp: 16 14    Temp:      TempSrc:      SpO2: 100% 100%    Weight:      Height:        Exam: on vent ,sedated  no jvd  throat ett in place  Chest cta bilat and lat  Cor reg no RG  Abd soft ntnd no ascites   Ext no LE edema   Neuro on vent and sedated, not following commands   RIJ TDC/ LUA AVF +bruit   Home meds include - asa, lipitor, diltiazem 180, hydralazine 25 tid, norco prn, keppra 1557m bid + 5039mpost HD mwf, dilantin 75 tid, renvela 3 ac tid, sod bicarb 650 bid, tradjenta 5 qd, tresiba 22u qd, prns/ vits/ supps   67.6kg    OP HD: MWF [facility]   3h 4565m 400/1.5   2/2 bath  65.5kg  Hep 2000  RIJ TDC / LUA BC AVF placed 08/25/21 (Dr ClaCarlis Abbott - venofer 100 x 10  - mircera 60 q2, last 11/2       Assessment/ Plan: Status epilepticus - per CCM/ neuro, intubated, propofol gtt dc'd. EEG monitoring. AED's per neuro.  VDRF - per CCM HTN emergency - BP's high on admit, better bit still high. Getting home hydralazine and diltiazem via NG, prn IV labetalol.  Volume - 1500 off w/ HD yesterday. Up 2-3 kg here, no ^vol on exam.  ESRD - on HD MWF. Next HD today.  DM uncont - sp IV insulin gtt, back on SQ insulin.   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    Inpatient medications:   aspirin  81 mg Per Tube Daily   atorvastatin  40 mg Per Tube Daily   chlorhexidine gluconate (MEDLINE KIT)  15 mL Mouth Rinse BID   Chlorhexidine Gluconate Cloth  6 each Topical Q0600   diltiazem  45 mg Per Tube Q6H   docusate  100 mg Per Tube BID   feeding supplement (PROSource TF)  45 mL Per Tube Daily   heparin  5,000 Units Subcutaneous Q8H   hydrALAZINE  50 mg Per Tube Q8H   insulin aspart  0-9 Units Subcutaneous Q4H   insulin aspart  3 Units Subcutaneous Q4H   insulin detemir  15 Units Subcutaneous Q12H   mouth rinse  15 mL Mouth Rinse 10 times per day   multivitamin  1 tablet Per Tube QHS   pantoprazole (PROTONIX) IV  40 mg Intravenous Daily   polyethylene glycol  17 g Per Tube Daily    sodium chloride Stopped (09/03/21 1206)   dexmedetomidine (PRECEDEX) IV infusion     feeding supplement (VITAL 1.5 CAL) Stopped (09/03/21 1204)   levETIRAcetam Stopped (09/03/21 1130)   phenytoin (DILANTIN) IV     sodium chloride, acetaminophen (TYLENOL) oral liquid 160 mg/5 mL, dextrose, hydrALAZINE, labetalol, midazolam, midazolam, sodium chloride flush

## 2021-09-03 NOTE — Progress Notes (Signed)
eLink Physician-Brief Progress Note Patient Name: Jonathan Kane DOB: [birth year] MRN: [id number]   Date of Service  09/03/2021  HPI/Events of Note  Bedside RN called attention to left neck thrill and faint chest wall pulsations, patient is an ESRD-DD patient with a left upper extremity A-V fistula, thrill likely transmitted from the fistula and chest wall pulsations likely from high blood flow related to the fistula.  eICU Interventions  I reviewed results of CTA head and neck from yesterday (which captured the chest as well), no reported abnormal findings requiring intervention.        Kerry Kass [name] 09/03/2021, 2:03 AM

## 2021-09-03 NOTE — Progress Notes (Signed)
LTM maint complete -  skin breakdown at [facility] F7 irratation under Fp1 . Leads adjusted with respect to the skin breakdown. Nurse notified  Serviced P3 P7 [facility] monitored, Event button test confirmed by [facility].

## 2021-09-03 NOTE — Progress Notes (Addendum)
Upon performing patient assessment, this RN, along with student RN, noticed "rippling" under the patient's skin in the upper medial chest area. No air under the skin was felt and breath sounds remained present in all quadrants. In addition, a palpable thrill-like sensation could be felt in the patient's left anterior shoulder area. E-link was notified. Dr. Lucile Shutters assessed patient. No further orders given. Will continue to monitor.

## 2021-09-03 NOTE — Progress Notes (Addendum)
NAME:  SPYROS WINCH, MRN:  [id number], DOB:  January 27, 1946, LOS: 2 ADMISSION DATE:  08/23/2021, CONSULTATION DATE:  08/20/2021 REFERRING MD:  Dr. Matilde Sprang, CHIEF COMPLAINT:  Seizures  Brief summary:   Mr. Mcglamery is a 75 year old male who presented to the ED via EMS 11/16 as a code stroke.  Last known normal 11/15 around 8 PM.  He was found at his recliner 11/16 with left gaze, left facial twitching, unresponsive.   In ED 11/16, was found to be severely hypertensive with sonorous respirations.  Intubated, placed on propofol gtt.  Neurology and nephrology consulted.  Overnight EEG demonstrated profound diffuse encephalopathy likely related to sedation and right hemisphere epileptogenicity; however without definite seizures recorded.  Blood pressures returned to normal range by 4 PM (4 hours after admission with hypertensive emergency).  Was found to have soft BP 1845-0115, propofol drip adjusted by E-link with subsequent return to normotension.   On HOD#2, propofol was discontinued, but patient remains unresponsive. Trialled on pressure support but was placed back on full vent support due to low minute ventilation. Early in the morning of HOD #3, was found to have two seizures on EEG. Neuro increased AEDs.   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  No purposeful movements.  No acute distress noted.  Objective   Blood pressure (!) 149/84, pulse 85, temperature 98.2 F (36.8 C), temperature source Oral, resp. rate 16, height 5\' 10"  (1.778 m), weight 68.2 kg, SpO2 100 %.    Vent Mode: PRVC FiO2  (%):  [40 %] 40 % Set Rate:  [16 bmp] 16 bmp Vt Set:  [580 mL] 580 mL PEEP:  [5 cmH20] 5 cmH20 Pressure Support:  [10 cmH20] 10 cmH20 Plateau Pressure:  [18 cmH20-24 cmH20] 19 cmH20   Intake/Output Summary (Last 24 hours) at 09/03/2021 1025 Last data filed at 09/03/2021 1003 Gross per 24 hour  Intake 1186.68 ml  Output 400 ml  Net 786.68 ml   Filed Weights   09/02/21 0030 09/02/21 0408 09/03/21 0416  Weight: 68.6 kg 67 kg 68.2 kg   Examination: General: Thin cachectic male currently intubated, no acute distress Cardiac: Regular rate and rhythm, no murmurs appreciated Respiratory: Clear to auscultation bilaterally in anterior and lateral fields Abdomen: Soft, nondistended, bowel sounds present Extremities: No BLE edema Neuro: Able to follow commands, no purposeful movement, PERRL, concordant rightward gaze  Assessment & Plan:   Status Epilepticus Stable.  Propofol DC'd 11/17 at 1450.  EEG overnight demonstrates 2 seizures without external signs at 0011 and 0 316, last seen 45-60 seconds.  Neuro to load with fosphenytoin 20 mg once and increase phenytoin to 125 mcg 3 times daily. - AEDs per neuro - Plan for MRI without contrast per neuro  Respiratory insufficiency  Patient placed back on full support due to low minute ventilation on pressure support. - Continue ventilatory support - Routine pulmonary hygiene  Fever of unknown etiology Acute. Patient febrile to 101.2 F overnight at 2328, responded well to Tylenol x1.  Unknown etiology of this fever.  CXR this morning appears unchanged from previous. - Follow-up UA, blood cultures - Continue Tylenol 650 mg per tube every 4 as  needed  Hypertension Worsening. Blood pressures hypertensive overnight, notably around time of fever at 2328.  Systolics 840T-353R, diastolics largely in the 17W-09L. - Continue home diltiazem 40 mg every 6 - Increase hydralazine from 25 to 50 mg per tube every 8 - Continue as needed labetalol and  hydralazine for goal SBP around 170.  Type 2 diabetes Stable. A.m. glucose 231.  ICU glucose goal 140-180.  - Pharmacy to increase basal insulin - Pharmacy to order tube feed coverage - CBGs every 4 - Continue sensitive sliding scale insulin  Hyperkalemia Improved. Potassium 6.9 yesterday afternoon.  Received Lokelma x1, bicarb x1 HD.  Potassium 4.6 this morning. - A.m. BMP  Best Practice (right click and "Reselect all SmartList Selections" daily)   Diet/type: NPO DVT prophylaxis: prophylactic heparin  GI prophylaxis: PPI Lines: N/A Foley:  N/A Code Status:  full code Last date of multidisciplinary goals of care discussion: None yet   Ezequiel Essex, MD Family Medicine PGY-2 [place] For pager details, please see AMION or use Epic chat  After 1900, please call [facility] for cross coverage needs 09/03/2021, 10:25 AM

## 2021-09-03 NOTE — Progress Notes (Signed)
eLink Physician-Brief Progress Note Patient Name: Jonathan Kane DOB: 1946/03/09 MRN: [id number]   Date of Service  09/03/2021  HPI/Events of Note  Patient with a fever.  eICU Interventions  PRN Tylenol ordered.        Kerry Kass AmeLie Hollars 09/03/2021, 12:09 AM

## 2021-09-03 NOTE — Procedures (Addendum)
Patient Name: Jonathan Kane  MRN: [id number]  Epilepsy Attending: Lora Havens  Referring Physician/Provider: Dr Rosalin Hawking Duration: 09/02/2021 1659 to 09/03/2021 1659   Patient history:  75 y.o. 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 Krista Blue at [facility] presented to ED for forced left gaze and left facial twitching, mouth smacking, also has intermittent left UE jerking. RUE flexed with tone and intermittent right foot twitching. EEG to evaluate for seizure.    Level of alertness:  comatose   AEDs during EEG study: PHT   Technical aspects: This EEG study was done with scalp electrodes positioned according to the 10-20 International system of electrode placement. Electrical activity was acquired at a sampling rate of 500Hz  and reviewed with a high frequency filter of 70Hz  and a low frequency filter of 1Hz . EEG data were recorded continuously and digitally stored.    Description: EEG showed continuous generalized 3-6Hz  theta-delta slowing. Lateralized periodic discharges with overriding fast activity ( LPD+) were also noted in right hemisphere, maximal right posterior quadrant with frequency fluctuating between 0.5 to 1hz . Four seizures without clinical signs were noted on 09/03/2021 at [place], [place], 0709 and 0856 arising from right hemisphere, maximal right posterior quadrant. EEG initially showed 5-6zh sharply contoured theta slowing in right hemisphere which then involved left frontal region and evolved into 2-3Hz  delta slowing, lasting about 45 to 60 seconds. Hyperventilation and photic stimulation were not performed.      ABNORMALITY - Seizures without clinical signs,  right hemisphere, maximal right posterior quadrant  - Lateralized periodic discharges with overriding fast activity, right hemisphere, maximal right posterior quadrant ( LPD+) - Continuous  slow, generalized   IMPRESSION: This study showed four seizures without clinical signs on 09/03/2021 at [place], [place], 0709 and 0856 arising from right hemisphere, maximal right posterior quadrant,  lasting about 45 to 60 seconds. There is also evidence of epileptogenicity arising from right hemisphere, maximal right posterior quadrant which is on the ictal-interictal continuum with high potential for seizure recurrence. Additionally there is severe diffuse encephalopathy, nonspecific etiology but likely related to sedation.    Martha Soltys [name]

## 2021-09-03 NOTE — Progress Notes (Signed)
LTM maint complete - no skin breakdown under:  FP1 P7 A1 Pt will be going to MRI later. Pt has MRI compatible leads on. Educated Therapist, sports on disconnecting leads.

## 2021-09-03 NOTE — Progress Notes (Signed)
Pt transported to MRI and back to [id number] without complications.

## 2021-09-04 DIAGNOSIS — J9601 Acute respiratory failure with hypoxia: Secondary | ICD-10-CM | POA: Diagnosis not present

## 2021-09-04 DIAGNOSIS — E43 Unspecified severe protein-calorie malnutrition: Secondary | ICD-10-CM | POA: Diagnosis not present

## 2021-09-04 DIAGNOSIS — G40901 Epilepsy, unspecified, not intractable, with status epilepticus: Secondary | ICD-10-CM | POA: Diagnosis not present

## 2021-09-04 DIAGNOSIS — N186 End stage renal disease: Secondary | ICD-10-CM | POA: Diagnosis not present

## 2021-09-04 LAB — PHOSPHORUS
Phosphorus: 4.9 mg/dL — ABNORMAL HIGH (ref 2.5–4.6)
Phosphorus: 4.4 mg/dL (ref 2.5–4.6)

## 2021-09-04 LAB — GLUCOSE, CAPILLARY
Glucose-Capillary: 191 mg/dL — ABNORMAL HIGH (ref 70–99)
Glucose-Capillary: 171 mg/dL — ABNORMAL HIGH (ref 70–99)
Glucose-Capillary: 117 mg/dL — ABNORMAL HIGH (ref 70–99)
Glucose-Capillary: 247 mg/dL — ABNORMAL HIGH (ref 70–99)
Glucose-Capillary: 227 mg/dL — ABNORMAL HIGH (ref 70–99)

## 2021-09-04 LAB — BASIC METABOLIC PANEL
Anion gap: 17 — ABNORMAL HIGH (ref 5–15)
BUN: 50 mg/dL — ABNORMAL HIGH (ref 8–23)
CO2: 21 mmol/L — ABNORMAL LOW (ref 22–32)
Calcium: 8.3 mg/dL — ABNORMAL LOW (ref 8.9–10.3)
Chloride: 95 mmol/L — ABNORMAL LOW (ref 98–111)
Creatinine, Ser: 9.36 mg/dL — ABNORMAL HIGH (ref 0.61–1.24)
GFR, Estimated: 5 mL/min — ABNORMAL LOW (ref >60)
Glucose, Bld: 196 mg/dL — ABNORMAL HIGH (ref 70–99)
Potassium: 5 mmol/L (ref 3.5–5.1)
Sodium: 133 mmol/L — ABNORMAL LOW (ref 135–145)

## 2021-09-04 LAB — CBC
HCT: 32.4 % — ABNORMAL LOW (ref 39.0–52.0)
Hemoglobin: 11 g/dL — ABNORMAL LOW (ref 13.0–17.0)
MCH: 31.3 pg (ref 26.0–34.0)
MCHC: 34 g/dL (ref 30.0–36.0)
MCV: 92.3 fL (ref 80.0–100.0)
Platelets: 155 10*3/uL (ref 150–400)
RBC: 3.51 MIL/uL — ABNORMAL LOW (ref 4.22–5.81)
RDW: 14.1 % (ref 11.5–15.5)
WBC: 8.7 10*3/uL (ref 4.0–10.5)
nRBC: 0 % (ref 0.0–0.2)

## 2021-09-04 LAB — URINE CULTURE: Culture: NO GROWTH

## 2021-09-04 LAB — PHENYTOIN LEVEL, TOTAL: Phenytoin Lvl: 15.6 ug/mL (ref 10.0–20.0)

## 2021-09-04 LAB — MAGNESIUM
Magnesium: 2.5 mg/dL — ABNORMAL HIGH (ref 1.7–2.4)
Magnesium: 2.1 mg/dL (ref 1.7–2.4)

## 2021-09-04 MED ORDER — HYDRALAZINE HCL 50 MG PO TABS
75.0000 mg | ORAL_TABLET | Freq: Three times a day (TID) | ORAL | Status: DC
  Administered 2021-09-04 – 2021-09-06 (×6): 75 mg
  Filled 2021-09-04 (×6): qty 1

## 2021-09-04 MED ORDER — SODIUM CHLORIDE 0.9 % IV SOLN
1.0000 g | INTRAVENOUS | Status: DC
  Administered 2021-09-04: 1 g via INTRAVENOUS
  Filled 2021-09-04: qty 10

## 2021-09-04 MED ORDER — INSULIN DETEMIR 100 UNIT/ML ~~LOC~~ SOLN
18.0000 [IU] | Freq: Two times a day (BID) | SUBCUTANEOUS | Status: DC
  Administered 2021-09-04: 18 [IU] via SUBCUTANEOUS
  Filled 2021-09-04 (×3): qty 0.18

## 2021-09-04 MED ORDER — HEPARIN SODIUM (PORCINE) 1000 UNIT/ML DIALYSIS
1500.0000 [IU] | INTRAMUSCULAR | Status: DC | PRN
  Filled 2021-09-04: qty 2

## 2021-09-04 MED ORDER — SODIUM CHLORIDE 0.9 % IV SOLN
400.0000 mg | INTRAVENOUS | Status: AC
  Administered 2021-09-04: 400 mg via INTRAVENOUS
  Filled 2021-09-04: qty 40

## 2021-09-04 MED ORDER — CLEVIDIPINE BUTYRATE 0.5 MG/ML IV EMUL
0.0000 mg/h | INTRAVENOUS | Status: DC
  Administered 2021-09-04: 8 mg/h via INTRAVENOUS
  Administered 2021-09-04: 1 mg/h via INTRAVENOUS
  Administered 2021-09-05: 20 mg/h via INTRAVENOUS
  Administered 2021-09-05: 18 mg/h via INTRAVENOUS
  Administered 2021-09-05: 19 mg/h via INTRAVENOUS
  Administered 2021-09-05: 16 mg/h via INTRAVENOUS
  Administered 2021-09-05: 18 mg/h via INTRAVENOUS
  Administered 2021-09-05: 15 mg/h via INTRAVENOUS
  Administered 2021-09-05: 19 mg/h via INTRAVENOUS
  Administered 2021-09-05: 8 mg/h via INTRAVENOUS
  Administered 2021-09-05 (×2): 21 mg/h via INTRAVENOUS
  Administered 2021-09-05: 20 mg/h via INTRAVENOUS
  Administered 2021-09-06: 21 mg/h via INTRAVENOUS
  Administered 2021-09-06 (×2): 18 mg/h via INTRAVENOUS
  Administered 2021-09-06 (×5): 21 mg/h via INTRAVENOUS
  Administered 2021-09-06: 19 mg/h via INTRAVENOUS
  Administered 2021-09-07: 21 mg/h via INTRAVENOUS
  Administered 2021-09-07: 19 mg/h via INTRAVENOUS
  Administered 2021-09-07: 18 mg/h via INTRAVENOUS
  Administered 2021-09-07 (×2): 21 mg/h via INTRAVENOUS
  Administered 2021-09-07: 20 mg/h via INTRAVENOUS
  Administered 2021-09-07: 18 mg/h via INTRAVENOUS
  Administered 2021-09-07 (×2): 20 mg/h via INTRAVENOUS
  Administered 2021-09-08 (×2): 15 mg/h via INTRAVENOUS
  Administered 2021-09-08: 12 mg/h via INTRAVENOUS
  Administered 2021-09-08: 8 mg/h via INTRAVENOUS
  Administered 2021-09-08: 12 mg/h via INTRAVENOUS
  Administered 2021-09-08 (×2): 15 mg/h via INTRAVENOUS
  Administered 2021-09-09: 8 mg/h via INTRAVENOUS
  Administered 2021-09-09: 13 mg/h via INTRAVENOUS
  Administered 2021-09-09: 15 mg/h via INTRAVENOUS
  Administered 2021-09-09: 14 mg/h via INTRAVENOUS
  Administered 2021-09-09: 11 mg/h via INTRAVENOUS
  Administered 2021-09-10: 13 mg/h via INTRAVENOUS
  Administered 2021-09-10: 17 mg/h via INTRAVENOUS
  Administered 2021-09-10: 15 mg/h via INTRAVENOUS
  Administered 2021-09-10: 8 mg/h via INTRAVENOUS
  Administered 2021-09-10: 15 mg/h via INTRAVENOUS
  Filled 2021-09-04 (×2): qty 100
  Filled 2021-09-04: qty 50
  Filled 2021-09-04 (×5): qty 100
  Filled 2021-09-04 (×2): qty 50
  Filled 2021-09-04 (×2): qty 100
  Filled 2021-09-04: qty 50
  Filled 2021-09-04 (×7): qty 100
  Filled 2021-09-04: qty 50
  Filled 2021-09-04 (×3): qty 100
  Filled 2021-09-04 (×2): qty 50
  Filled 2021-09-04 (×27): qty 100

## 2021-09-04 MED ORDER — HEPARIN SODIUM (PORCINE) 1000 UNIT/ML IJ SOLN
INTRAMUSCULAR | Status: AC
  Administered 2021-09-04: 1000 [IU]
  Filled 2021-09-04: qty 4

## 2021-09-04 NOTE — Progress Notes (Signed)
Subjective: MRI yesterday with extensive restricted diffusion.  Exam: Vitals:   09/04/21 1230 09/04/21 1250  BP: (!) 156/73 (!) 204/90  Pulse: 99 (!) 102  Resp: 18 19  Temp:  98.4 F (36.9 C)  SpO2:     Gen: In bed, NAD Resp: non-labored breathing, no acute distress Abd: soft, nt  Neuro: MS: Does not open eyes or follow commands CN: Pupils reactive bilaterally, corneals intact Motor: Flexion to noxious stimulation x four Sensory: As above  Pertinent Labs: Na 133 BUN 50 Hgb 11.0  Impression: 75 yo M presenting with status epielpticus initially controlled on propofol. He has had some degree of PLEDs and recurrent seizures, but EEG has been improving. Unfortunately, he has extensive diffusion change on MRI.  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  Either way, given the degree of diffusion restriction, I am concerned that some of this likely represents permanent injury.  Recommendations: 1) continue Keppra 750 twice daily (renally dosed) 2) continue Dilantin 125 mg 3 times daily, daily levels 3) vimpat x 1 4) strict BP control 5) will cotninue to follow.   This patient is critically ill and at significant risk of neurological worsening, death and care requires constant monitoring of vital signs, hemodynamics,respiratory and cardiac monitoring, neurological assessment, discussion with family, other specialists and medical decision making of high complexity. I spent 40 minutes of neurocritical care time  in the care of  this patient. This was time spent independent of any time provided by nurse practitioner or PA.  Roland Rack, MD [facility] [phone]  If 7pm- 7am, please page neurology on call as listed in [place]. 09/04/2021  7:17 PM

## 2021-09-04 NOTE — Plan of Care (Signed)
CHANGE IN DIALYSIS SCHEDULE DUE TO EWAN GRAU April 23, 1946 [id number]  Patient dialysis schedule for the week of 09/05/21-09/12/21 varies from their normal schedule due to Thanksgiving Holiday.  Unfortunately due to unforeseen circumstances schedule while admitted to [facility] will be different from outpatient dialysis schedule.  Need to keep in mind for discharge planning.  Both schedules are as follows:  Normal HD Schedule: Monday Wednesday Friday Inpatient HD schedule at [facility]: Sunday, Tuesday, Friday Outpatient HD schedule: Monday, Wednesday, Saturday  They will resume their normal schedule on 09/13/21.     Jen Mow, PA-C [facility] Pager: [phone]

## 2021-09-04 NOTE — Progress Notes (Signed)
Called daughter Xhaiden Coombs at [phone] to provide an update. She reports just hearing from the neurologist a few minutes ago. She has no further questions at this time. Advised her that she may call the [facility] front desk at any time with questions or requesting an update.   Ezequiel Essex, MD

## 2021-09-04 NOTE — Progress Notes (Signed)
Sputum container in line to collect specimen. Will send to lab once sample is obtained

## 2021-09-04 NOTE — Progress Notes (Addendum)
NAME:  Jonathan Kane, MRN:  [id number], DOB:  1946-03-29, LOS: 3 ADMISSION DATE:  09/05/2021, CONSULTATION DATE:  08/29/2021 REFERRING MD:  Dr. Matilde Sprang, CHIEF COMPLAINT:  Seizures  Brief summary:   [name] is a 75 year old male who presented to the ED via EMS 11/16 as a code stroke.  Last known normal 11/15 around 8 PM.  He was found at his recliner 11/16 with left gaze, left facial twitching, unresponsive.   In ED 11/16, was found to be severely hypertensive with sonorous respirations.  Intubated, placed on propofol gtt.  Neurology and nephrology consulted.  Overnight EEG demonstrated profound diffuse encephalopathy likely related to sedation and right hemisphere epileptogenicity; however without definite seizures recorded.  Blood pressures returned to normal range by 4 PM (4 hours after admission with hypertensive emergency).  Was found to have soft BP 1845-0115, propofol drip adjusted by E-link with subsequent return to normotension.   On HOD#2, propofol was discontinued, but patient remains unresponsive. Trialled on pressure support but was placed back on full vent support due to low minute ventilation. Early in the morning of HOD #3, was found to have two seizures on EEG. Neuro increased AEDs.   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  No purposeful movements.  No acute distress noted.  Objective   Blood pressure (!) 172/75, pulse 99, temperature 98.4 F (36.9 C), temperature source Oral, resp. rate 20, height 5\' 10"  (1.778  m), weight 67.8 kg, SpO2 100 %.    Vent Mode: PSV;CPAP FiO2 (%):  [40 %] 40 % Set Rate:  [16 bmp] 16 bmp Vt Set:  [580 mL] 580 mL PEEP:  [5 cmH20] 5 cmH20 Pressure Support:  [8 cmH20] 8 cmH20 Plateau Pressure:  [15 cmH20-19 cmH20] 15 cmH20   Intake/Output Summary (Last 24 hours) at 09/04/2021 1206 Last data filed at 09/04/2021 0400 Gross per 24 hour  Intake 1285.49 ml  Output --  Net 1285.49 ml   Filed Weights   09/03/21 0416 09/04/21 0131 09/04/21 0900  Weight: 68.2 kg 67.8 kg 67.8 kg   Examination: General: Thin cachectic male currently intubated, no acute distress Cardiac: Regular rate and rhythm, no murmurs appreciated Respiratory: Clear to auscultation bilaterally in anterior and lateral fields Abdomen: Soft, nondistended, bowel sounds present Extremities: No BLE edema Neuro: Unable to follow commands, no purposeful movement, PERRL, concordant rightward gaze  Assessment & Plan:   Status Epilepticus Stable. No seizures overnight per neuro note on EEG. MRI concerning for irreversible damage. Differential seizures vs PRES.  - AEDs per neuro - Plan for MRI without contrast per neuro  Respiratory insufficiency  Doing well and breathing spontaneously on pressure support at this time.  - Continue pressure support - Routine pulmonary hygiene  Fever of unknown etiology Patient afebrile last 24 hours.  - Urine and blood cultures pending at this time - Sputum culture collect - Empiric CTX until cultures return - Continue Tylenol 650 mg per tube every 4 as needed  Hypertension Continue to be hypertensive despite yesterday's increase of hydralazine to 50 mg q6.  - Continue home diltiazem 40 mg every 6 -  Increase hydralazine from 50 to 75 mg per tube every 8 - Continue as needed labetalol and hydralazine for goal SBP around 170.  Type 2 diabetes Stable, uncontrolled. AM glucose 219, 191.  ICU glucose goal 140-180.  - Increase levemir to 18 units - Continue tube feed  coverage - CBGs every 4 - Continue sensitive sliding scale insulin  Hyperkalemia Resolved. K 5.0 this morning.  - A.m. BMP  Best Practice (right click and "Reselect all SmartList Selections" daily)   Diet/type: NPO DVT prophylaxis: prophylactic heparin  GI prophylaxis: PPI Lines: N/A Foley:  N/A Code Status:  full code Last date of multidisciplinary goals of care discussion: None yet   Ezequiel Essex, MD Family Medicine PGY-2 [place] [facility] For pager details, please see AMION or use Epic chat  After 1900, please call [facility] for cross coverage needs 09/04/2021, 12:06 PM

## 2021-09-04 NOTE — Progress Notes (Signed)
Placed on PS by MD

## 2021-09-04 NOTE — Procedures (Addendum)
Patient Name: Jonathan Kane  MRN: [id number]  Epilepsy Attending: Lora Havens  Referring Physician/Provider: Dr Rosalin Hawking Duration: 09/03/2021 0630 to 09/04/2021 1659   Patient history:  75 y.o. 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 Krista Blue at [facility] presented to ED for forced left gaze and left facial twitching, mouth smacking, also has intermittent left UE jerking. RUE flexed with tone and intermittent right foot twitching. EEG to evaluate for seizure.    Level of alertness:  comatose   AEDs during EEG study: PHT, LEV   Technical aspects: This EEG study was done with scalp electrodes positioned according to the 10-20 International system of electrode placement. Electrical activity was acquired at a sampling rate of 500Hz  and reviewed with a high frequency filter of 70Hz  and a low frequency filter of 1Hz . EEG data were recorded continuously and digitally stored.    Description: EEG showed continuous generalized 3-6Hz  theta-delta slowing. At the beginning of study, lateralized periodic discharges were also noted in right hemisphere, maximal right posterior quadrant every 4 seconds which gradually evolved into abundant spikes in right hemisphere, maximal right posterior quadrant. Hyperventilation and photic stimulation were not performed.      ABNORMALITY - Lateralized periodic discharges with overriding fast activity, right hemisphere, maximal right posterior quadrant ( LPD+) - Continuous slow, generalized   IMPRESSION: This study showed evidence of epileptogenicity arising from right hemisphere, maximal right posterior quadrant which is on the ictal-interictal continuum with low to intermediate potential for seizure recurrence. Additionally there is moderate to severe diffuse encephalopathy, nonspecific etiology but likely related to sedation. No  seizures  were seen during this study.   Izabellah Dadisman [name]

## 2021-09-04 NOTE — Progress Notes (Signed)
[facility] Progress Note  Subjective: seen in ICU, off sedation but not waking up  Vitals:   09/04/21 1118 09/04/21 1130 09/04/21 1145 09/04/21 1200  BP:  (!) 177/78 (!) 177/79 (!) 172/75  Pulse: 98 99 97 99  Resp: _0 Temp: 98.4 F (36.9 C)     TempSrc: Oral     SpO2:      Weight:      Height:        Exam: on vent ,sedated  no jvd  throat ett in place  Chest cta bilat and lat  Cor reg no RG  Abd soft ntnd no ascites   Ext no LE edema   Neuro on vent and sedated, not following commands   RIJ TDC/ LUA AVF +bruit   Home meds include - asa, lipitor, diltiazem 180, hydralazine 25 tid, norco prn, keppra 1562m bid + 5069mpost HD mwf, dilantin 75 tid, renvela 3 ac tid, sod bicarb 650 bid, tradjenta 5 qd, tresiba 22u qd, prns/ vits/ supps   67.6kg    OP HD: MWF south   3h 4573m 400/1.5   2/2 bath  65.5kg  Hep 2000  RIJ TDC / LUA BC AVF placed 08/25/21 (Dr ClaCarlis Abbott - venofer 100 x 10  - mircera 60 q2, last 11/2       Assessment/ Plan: Status epilepticus - per CCM/ neuro, intubated. EEG monitoring. AED's per neuro. No further seizures per neuro at this time. VDRF - per CCM HTN emergency - BP's high on admit, better but still high. Getting hydralazine and diltiazem via NG + prn IV meds.  Volume - up 2-3kg , same UF goal w/ hd today ESRD - on HD MWF. HD today off schedule.  Next HD tomorrow (holiday schedule).  DM uncont - sp IV insulin gtt, now is on SQ insulin.   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     Inpatient medications:  aspirin  81 mg Per Tube Daily   atorvastatin  40 mg Per Tube Daily   chlorhexidine gluconate (MEDLINE KIT)  15 mL Mouth Rinse BID   Chlorhexidine Gluconate Cloth  6 each Topical Q0600   diltiazem  45 mg Per Tube Q6H   docusate  100 mg Per Tube BID   feeding supplement (PROSource TF)  45 mL Per Tube Daily   heparin  5,000 Units Subcutaneous Q8H   heparin sodium (porcine)       hydrALAZINE  75 mg Per Tube Q8H   insulin aspart  0-9 Units Subcutaneous Q4H   insulin aspart  3 Units Subcutaneous Q4H   insulin detemir  18 Units Subcutaneous Q12H   mouth rinse  15 mL Mouth Rinse 10 times per day   multivitamin  1 tablet Per Tube QHS   pantoprazole (PROTONIX) IV  40 mg Intravenous Daily   polyethylene glycol  17 g Per Tube Daily    sodium chloride 10 mL/hr at 09/04/21 1200   cefTRIAXone (ROCEPHIN)  IV     feeding supplement (VITAL 1.5 CAL) 1,000 mL (09/03/21 1547)   levETIRAcetam Stopped (09/03/21 2231)   phenytoin (DILANTIN) IV Stopped (09/04/21 0816)   sodium chloride, acetaminophen (TYLENOL) oral liquid 160 mg/5 mL, dextrose, [START ON 09/05/2021] heparin, hydrALAZINE, labetalol, midazolam, midazolam, sodium chloride flush       

## 2021-09-04 NOTE — Progress Notes (Signed)
Patient's ETT  was pulled out to 19 at the lip, from 24, during a turn. Patient's vitals remained stable. RT to bedside and advanced tube to 24 at the lip without complication. Patient's vitals remained stable after advancement.

## 2021-09-05 ENCOUNTER — Inpatient Hospital Stay: Payer: Self-pay

## 2021-09-05 DIAGNOSIS — E43 Unspecified severe protein-calorie malnutrition: Secondary | ICD-10-CM | POA: Diagnosis not present

## 2021-09-05 DIAGNOSIS — G40901 Epilepsy, unspecified, not intractable, with status epilepticus: Secondary | ICD-10-CM | POA: Diagnosis not present

## 2021-09-05 DIAGNOSIS — N186 End stage renal disease: Secondary | ICD-10-CM | POA: Diagnosis not present

## 2021-09-05 DIAGNOSIS — J9601 Acute respiratory failure with hypoxia: Secondary | ICD-10-CM | POA: Diagnosis not present

## 2021-09-05 LAB — GLUCOSE, CAPILLARY
Glucose-Capillary: 107 mg/dL — ABNORMAL HIGH (ref 70–99)
Glucose-Capillary: 268 mg/dL — ABNORMAL HIGH (ref 70–99)
Glucose-Capillary: 295 mg/dL — ABNORMAL HIGH (ref 70–99)
Glucose-Capillary: 319 mg/dL — ABNORMAL HIGH (ref 70–99)
Glucose-Capillary: 349 mg/dL — ABNORMAL HIGH (ref 70–99)
Glucose-Capillary: 357 mg/dL — ABNORMAL HIGH (ref 70–99)
Glucose-Capillary: 433 mg/dL — ABNORMAL HIGH (ref 70–99)

## 2021-09-05 LAB — BASIC METABOLIC PANEL
Anion gap: 14 (ref 5–15)
BUN: 32 mg/dL — ABNORMAL HIGH (ref 8–23)
CO2: 22 mmol/L (ref 22–32)
Calcium: 8.1 mg/dL — ABNORMAL LOW (ref 8.9–10.3)
Chloride: 95 mmol/L — ABNORMAL LOW (ref 98–111)
Creatinine, Ser: 6.59 mg/dL — ABNORMAL HIGH (ref 0.61–1.24)
GFR, Estimated: 8 mL/min — ABNORMAL LOW (ref >60)
Glucose, Bld: 331 mg/dL — ABNORMAL HIGH (ref 70–99)
Potassium: 4.4 mmol/L (ref 3.5–5.1)
Sodium: 131 mmol/L — ABNORMAL LOW (ref 135–145)

## 2021-09-05 LAB — CBC
HCT: 33.5 % — ABNORMAL LOW (ref 39.0–52.0)
Hemoglobin: 11.2 g/dL — ABNORMAL LOW (ref 13.0–17.0)
MCH: 31.4 pg (ref 26.0–34.0)
MCHC: 33.4 g/dL (ref 30.0–36.0)
MCV: 93.8 fL (ref 80.0–100.0)
Platelets: 162 10*3/uL (ref 150–400)
RBC: 3.57 MIL/uL — ABNORMAL LOW (ref 4.22–5.81)
RDW: 14.2 % (ref 11.5–15.5)
WBC: 8.5 10*3/uL (ref 4.0–10.5)
nRBC: 0 % (ref 0.0–0.2)

## 2021-09-05 LAB — MAGNESIUM: Magnesium: 2.2 mg/dL (ref 1.7–2.4)

## 2021-09-05 LAB — PHENYTOIN LEVEL, TOTAL: Phenytoin Lvl: 13.6 ug/mL (ref 10.0–20.0)

## 2021-09-05 LAB — TRIGLYCERIDES: Triglycerides: 106 mg/dL (ref <150)

## 2021-09-05 LAB — PHOSPHORUS: Phosphorus: 2.4 mg/dL — ABNORMAL LOW (ref 2.5–4.6)

## 2021-09-05 MED ORDER — INSULIN ASPART 100 UNIT/ML IJ SOLN
0.0000 [IU] | INTRAMUSCULAR | Status: DC
Start: 2021-09-05 — End: 2021-09-11
  Administered 2021-09-05: 15 [IU] via SUBCUTANEOUS
  Administered 2021-09-06: 11 [IU] via SUBCUTANEOUS
  Administered 2021-09-06 (×3): 5 [IU] via SUBCUTANEOUS
  Administered 2021-09-06: 2 [IU] via SUBCUTANEOUS
  Administered 2021-09-06: 5 [IU] via SUBCUTANEOUS
  Administered 2021-09-06: 2 [IU] via SUBCUTANEOUS
  Administered 2021-09-07: 3 [IU] via SUBCUTANEOUS
  Administered 2021-09-07 (×2): 5 [IU] via SUBCUTANEOUS
  Administered 2021-09-07: 3 [IU] via SUBCUTANEOUS
  Administered 2021-09-07: 5 [IU] via SUBCUTANEOUS
  Administered 2021-09-07 – 2021-09-08 (×2): 2 [IU] via SUBCUTANEOUS
  Administered 2021-09-08: 3 [IU] via SUBCUTANEOUS
  Administered 2021-09-08: 2 [IU] via SUBCUTANEOUS
  Administered 2021-09-08 – 2021-09-09 (×2): 5 [IU] via SUBCUTANEOUS
  Administered 2021-09-09: 3 [IU] via SUBCUTANEOUS
  Administered 2021-09-10 – 2021-09-11 (×4): 5 [IU] via SUBCUTANEOUS
  Administered 2021-09-11: 8 [IU] via SUBCUTANEOUS

## 2021-09-05 MED ORDER — SODIUM CHLORIDE 0.9 % IV SOLN: 1.0000 g | INTRAVENOUS | Status: DC

## 2021-09-05 MED ORDER — AMLODIPINE BESYLATE 5 MG PO TABS
5.0000 mg | ORAL_TABLET | Freq: Every day | ORAL | Status: DC
  Administered 2021-09-05: 5 mg via ORAL
  Filled 2021-09-05: qty 1

## 2021-09-05 MED ORDER — LABETALOL HCL 100 MG PO TABS
100.0000 mg | ORAL_TABLET | Freq: Three times a day (TID) | ORAL | Status: DC
  Administered 2021-09-05 – 2021-09-07 (×8): 100 mg
  Filled 2021-09-05 (×9): qty 1

## 2021-09-05 MED ORDER — SODIUM CHLORIDE 0.9 % IV SOLN
1.0000 g | Freq: Once | INTRAVENOUS | Status: AC
  Administered 2021-09-05: 1 g via INTRAVENOUS

## 2021-09-05 MED ORDER — SODIUM CHLORIDE 0.9 % IV SOLN
1.0000 g | INTRAVENOUS | Status: DC
  Administered 2021-09-06: 1 g via INTRAVENOUS

## 2021-09-05 MED ORDER — INSULIN DETEMIR 100 UNIT/ML ~~LOC~~ SOLN
20.0000 [IU] | Freq: Two times a day (BID) | SUBCUTANEOUS | Status: DC
  Administered 2021-09-05: 20 [IU] via SUBCUTANEOUS
  Filled 2021-09-05 (×2): qty 0.2

## 2021-09-05 MED ORDER — AMLODIPINE BESYLATE 5 MG PO TABS
5.0000 mg | ORAL_TABLET | Freq: Every day | ORAL | Status: DC
Start: 2021-09-06 — End: 2021-09-06
  Filled 2021-09-05: qty 1

## 2021-09-05 MED ORDER — HEPARIN SODIUM (PORCINE) 1000 UNIT/ML DIALYSIS
2000.0000 [IU] | Freq: Once | INTRAMUSCULAR | Status: DC
  Filled 2021-09-05: qty 2

## 2021-09-05 MED ORDER — INSULIN DETEMIR 100 UNIT/ML ~~LOC~~ SOLN
24.0000 [IU] | Freq: Two times a day (BID) | SUBCUTANEOUS | Status: DC
  Administered 2021-09-05 – 2021-09-07 (×5): 24 [IU] via SUBCUTANEOUS
  Filled 2021-09-05 (×7): qty 0.24

## 2021-09-05 NOTE — Progress Notes (Signed)
Secure chat with Dr Jonnie Finner re PICC placement.  States does not approve PICC placement at this time.  Cori Therapist, sports states he currently has adequate IV access.  [place] Message sent to Dr Tacy Learn.

## 2021-09-05 NOTE — Progress Notes (Addendum)
[facility] Progress Note  Subjective: seen in ICU, off sedation but not waking up. MRI showed sig changes, see neuro note.   Vitals:   09/05/21 0100 09/05/21 0357 09/05/21 0400 09/05/21 0500  BP: 140/64 (!) 154/64 (!) 146/65 (!) 157/61  Pulse: 100 (!) 101 (!) 105 (!) 101  Resp: '14 18 15 14  ' Temp:   99.7 F (37.6 C)   TempSrc:   Oral   SpO2: 100% 100% 100% 100%  Weight:      Height:        Exam: on vent ,sedated  no jvd  throat ett in place  Chest cta bilat and lat  Cor reg no RG  Abd soft ntnd no ascites   Ext no LE edema   Neuro on vent and sedated, not following commands   RIJ TDC/ LUA AVF +bruit   Home meds include - asa, lipitor, diltiazem 180, hydralazine 25 tid, norco prn, keppra 1552m bid + 5095mpost HD mwf, dilantin 75 tid, renvela 3 ac tid, sod bicarb 650 bid, tradjenta 5 qd, tresiba 22u qd, prns/ vits/ supps   67.6kg    OP HD: MWF south   3h 4513m 400/1.5   2/2 bath  65.5kg  Hep 2000  RIJ TDC / LUA BC AVF placed 08/25/21 (Dr ClaCarlis Abbott - venofer 100 x 10  - mircera 60 q2, last 11/2       Assessment/ Plan: Status epilepticus - per CCM/ neuro, intubated. EEG monitoring. AED's per neuro. Seizures improved, but MRI w/ sig changes that likely represent permanent injury, per neurology. VDRF - per CCM HTN emergency - BP's high on admit, BP's better. NG hydralazine + IV clevidipine. No vol excess, at dry wt.  Volume - no vol excess, at dry wt, prob losing wt. UF goal 2 L today.  ESRD - on HD MWF. HD today per holiday schedule. Sun-Tues-Fri this week.  DM uncont - sp IV insulin gtt, now is on SQ insulin.   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, no esa needs for now MBD ckd - resume binder when eating /off the vent     Jonathan Kane 09/05/2021, 5:51 AM   Recent Labs  Lab 09/04/21 0709 09/04/21 1627 09/05/21 0329  K 5.0  --  4.4  BUN 50*  --  32*  CREATININE 9.36*  --  6.59*  CALCIUM 8.3*  --  8.1*  PHOS 4.9* 4.4 2.4*  HGB 11.0*  --  11.2*     Inpatient medications:  aspirin  81 mg Per Tube Daily   atorvastatin  40 mg Per Tube Daily   chlorhexidine gluconate (MEDLINE KIT)  15 mL Mouth Rinse BID   Chlorhexidine Gluconate Cloth  6 each Topical Q0600   docusate  100 mg Per Tube BID   feeding supplement (PROSource TF)  45 mL Per Tube Daily   heparin  5,000 Units Subcutaneous Q8H   hydrALAZINE  75 mg Per Tube Q8H   insulin aspart  0-9 Units Subcutaneous Q4H   insulin aspart  3 Units Subcutaneous Q4H   insulin detemir  18 Units Subcutaneous Q12H   mouth rinse  15 mL Mouth Rinse 10 times per day   multivitamin  1 tablet Per Tube QHS   pantoprazole (PROTONIX) IV  40 mg Intravenous Daily   polyethylene glycol  17 g Per Tube Daily    sodium chloride 10 mL/hr at 09/05/21 0400   cefTRIAXone (ROCEPHIN)  IV Stopped (09/04/21 1412)  clevidipine 19 mg/hr (09/05/21 0530)   feeding supplement (VITAL 1.5 CAL) 1,000 mL (09/03/21 1547)   levETIRAcetam Stopped (09/04/21 2244)   phenytoin (DILANTIN) IV Stopped (09/04/21 2342)   sodium chloride, acetaminophen (TYLENOL) oral liquid 160 mg/5 mL, dextrose, heparin, hydrALAZINE, labetalol, midazolam, sodium chloride flush

## 2021-09-05 NOTE — Progress Notes (Signed)
Unable to Maintenance the EEG leads at this time/ Pt is having dialysis. Informed [facility]. Continue to monitor

## 2021-09-05 NOTE — Progress Notes (Signed)
NAME:  Jonathan Kane, MRN:  [id number], DOB:  25-Jan-1946, LOS: 4 ADMISSION DATE:  09/10/2021, CONSULTATION DATE:  08/31/2021 REFERRING MD:  Dr. Matilde Sprang, CHIEF COMPLAINT:  Seizures  Brief summary:   Mr. Kidane is a 75 year old male who presented to the ED via EMS 11/16 as a code stroke.  Last known normal 11/15 around 8 PM.  He was found at his recliner 11/16 with left gaze, left facial twitching, unresponsive.   In ED 11/16, was found to be severely hypertensive with sonorous respirations.  Intubated, placed on propofol gtt.  Neurology and nephrology consulted.  Overnight EEG demonstrated profound diffuse encephalopathy likely related to sedation and right hemisphere epileptogenicity; however without definite seizures recorded.  Blood pressures returned to normal range by 4 PM (4 hours after admission with hypertensive emergency).  Was found to have soft BP 1845-0115, propofol drip adjusted by E-link with subsequent return to normotension.   On HOD#2, propofol was discontinued, but patient remains unresponsive. Trialled on pressure support but was placed back on full vent support due to low minute ventilation. Early in the morning of HOD #3, was found to have two seizures on EEG. Neuro increased AEDs.   HOD#4, no seizures overnight.   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  Withdraws to pain in all 4 extremities.  Objective   Blood pressure (!) 154/57, pulse (!) 101, temperature 99.9 F (37.7 C), temperature source Oral, resp.  rate 20, height 5\' 10"  (1.778 m), weight 65.5 kg, SpO2 100 %.    Vent Mode: PRVC FiO2 (%):  [40 %] 40 % Set Rate:  [16 bmp] 16 bmp Vt Set:  [580 mL] 580 mL PEEP:  [5 cmH20] 5 cmH20 Pressure Support:  [8 cmH20] 8 cmH20 Plateau Pressure:  [15 cmH20-17 cmH20] 17 cmH20   Intake/Output Summary (Last 24 hours) at 09/05/2021 1100 Last data filed at 09/05/2021 1000 Gross per 24 hour  Intake 2680.14 ml  Output 2100 ml  Net 580.14 ml   Filed Weights   09/04/21 0131 09/04/21 0900 09/04/21 1250  Weight: 67.8 kg 67.8 kg 65.5 kg   Examination: General: Thin cachectic male currently intubated, no acute distress Cardiac: Regular rate and rhythm, no murmurs appreciated Respiratory: Clear to auscultation bilaterally in anterior and lateral fields Abdomen: Soft, nondistended, bowel sounds present Extremities: No BLE edema Neuro: Unable to follow commands, withdraws to pain in all extremities, PERRL, concordant rightward gaze  Assessment & Plan:   Status Epilepticus Stable. No seizures overnight per neuro note on EEG. MRI concerning for irreversible damage. Differential seizures vs PRES.  - AEDs per neuro  Respiratory insufficiency  Back to full vent yesterday.  Continue daily trials on pressure support. - Continue full vent support - Routine pulmonary hygiene  Fever of unknown etiology Patient afebrile last 24 hours.  - Urine and blood cultures pending at this time -Follow-up sputum culture - Empiric CTX x5 days - Continue Tylenol 650 mg per tube every 4 as needed  Hypertension Continue to be hypertensive despite yesterday's increase of hydralazine to 50 mg q6.  - Continue home diltiazem 40 mg every 6 -  Continue hydralazine 75 mg per tube every 8 - Add labetalol 100 mg 3 times daily per tube - Continue as needed labetalol and hydralazine for goal SBP around 130-150  Type 2 diabetes Uncontrolled.  Glucose last 24 hours to 68-57.  ICU glucose goal 140-180.  - Increase levemir to 24  units - Continue tube feed coverage - CBGs every 4 - Continue sensitive sliding scale insulin  Hyperkalemia Stable.  Potassium 4.5 this morning. - A.m. BMP  Best Practice (right click and "Reselect all SmartList Selections" daily)   Diet/type: NPO DVT prophylaxis: prophylactic heparin  GI prophylaxis: PPI Lines: N/A Foley:  N/A Code Status:  full code Last date of multidisciplinary goals of care discussion: None yet   Ezequiel Essex, MD Family Medicine PGY-2 [facility] For pager details, please see AMION or use Epic chat  After 1900, please call [facility] for cross coverage needs 09/05/2021, 11:00 AM

## 2021-09-05 NOTE — Procedures (Addendum)
Patient Name: Jonathan Kane  MRN: [id number]  Epilepsy Attending: Lora Havens  Referring Physician/Provider: Dr Rosalin Hawking Duration: 09/04/2021 1659 to 09/05/2021 1659   Patient history:  75 y.o. 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 Krista Blue at [facility] presented to ED for forced left gaze and left facial twitching, mouth smacking, also has intermittent left UE jerking. RUE flexed with tone and intermittent right foot twitching. EEG to evaluate for seizure.    Level of alertness: lethargic   AEDs during EEG study: PHT, LEV   Technical aspects: This EEG study was done with scalp electrodes positioned according to the 10-20 International system of electrode placement. Electrical activity was acquired at a sampling rate of 500Hz  and reviewed with a high frequency filter of 70Hz  and a low frequency filter of 1Hz . EEG data were recorded continuously and digitally stored.    Description: EEG showed continuous generalized and maximal bifrontal region predominantly 6-9hz  theta-alpha activity as well as intermittent 2-3hz  generalized delta slowing admixed with 15-18Hz  generalized beta activity. Abundant spikes were noted in right hemisphere, maximal right posterior quadrant. Hyperventilation and photic stimulation were not performed.      ABNORMALITY - Spike, right hemisphere, maximal right posterior quadrant ( LPD+) - Continuous slow, generalized and maximal bifrontal   IMPRESSION: This study showed evidence of epileptogenicity arising from right hemisphere, maximal right posterior quadrant. There is also cortical dysfunction in bilateral frontal region which could be secondary to underlying encephalomalacia. Additionally there is moderate to severe diffuse encephalopathy, nonspecific etiology. No seizures  were seen during this study.  EEG appears to be  improving compared to previous day.   Tajai Suder [name]

## 2021-09-05 NOTE — Progress Notes (Signed)
eLink Physician-Brief Progress Note Patient Name: Jonathan Kane DOB: January 23, 1946 MRN: [id number]   Date of Service  09/05/2021  HPI/Events of Note  Hyperglycemia - Blood glucose = 433. Patient is on Q 4 hour sensitive Novolog SSI + Q 4 hour TF coverage + Levemir Q 12 hours.   eICU Interventions  Plan: Will increase to Q 4 hour moderate Novolog SSI.     Intervention Category Major Interventions: Hyperglycemia - active titration of insulin therapy  Lysle Dingwall 09/05/2021, 8:44 PM

## 2021-09-05 NOTE — Progress Notes (Signed)
Subjective: No significant changes.  Exam: Vitals:   09/05/21 1530 09/05/21 1531  BP:  (!) 149/75  Pulse:  100  Resp:  14  Temp: 97.8 F (36.6 C)   SpO2:  100%   Gen: In bed, intubated Resp: non-labored breathing, no acute distress Abd: soft, nt  Neuro: MS: opens eyes partially to nox stim, does not follow commands CN: Pupils reactive bilaterally, corneals intact Motor: Flexion to noxious stimulation x 4 Sensory: As above  Pertinent Labs: Na 131 BUN 32   Impression: 75 yo M presenting with status epielpticus initially controlled on propofol. He has had some degree of PLEDs and recurrent seizures, but EEG has been improving. Unfortunately, he has extensive diffusion change on MRI.  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  Either way, given the degree of diffusion restriction, I am concerned that some of this likely represents permanent injury.  Recommendations: 1) continue Keppra 750 twice daily (renally dosed) 2) continue Dilantin 125 mg 3 times daily, daily levels 3) strict BP control 4) will continue to follow.   This patient is critically ill and at significant risk of neurological worsening, death and care requires constant monitoring of vital signs, hemodynamics,respiratory and cardiac monitoring, neurological assessment, discussion with family, other specialists and medical decision making of high complexity. I spent 40 minutes of neurocritical care time  in the care of  this patient. This was time spent independent of any time provided by nurse practitioner or PA.  Roland Rack, MD [facility] [phone]  If 7pm- 7am, please page neurology on call as listed in [place]. 09/05/2021  3:54 PM

## 2021-09-06 DIAGNOSIS — J9602 Acute respiratory failure with hypercapnia: Secondary | ICD-10-CM | POA: Diagnosis not present

## 2021-09-06 DIAGNOSIS — G40901 Epilepsy, unspecified, not intractable, with status epilepticus: Secondary | ICD-10-CM | POA: Diagnosis not present

## 2021-09-06 LAB — POCT I-STAT 7, (LYTES, BLD GAS, ICA,H+H)
Acid-Base Excess: 5 mmol/L — ABNORMAL HIGH (ref 0.0–2.0)
Bicarbonate: 26.6 mmol/L (ref 20.0–28.0)
Calcium, Ion: 1.07 mmol/L — ABNORMAL LOW (ref 1.15–1.40)
HCT: 34 % — ABNORMAL LOW (ref 39.0–52.0)
Hemoglobin: 11.6 g/dL — ABNORMAL LOW (ref 13.0–17.0)
O2 Saturation: 100 %
Patient temperature: 98.2
Potassium: 4.1 mmol/L (ref 3.5–5.1)
Sodium: 135 mmol/L (ref 135–145)
TCO2: 27 mmol/L (ref 22–32)
pCO2 arterial: 28.5 mmHg — ABNORMAL LOW (ref 32.0–48.0)
pH, Arterial: 7.577 — ABNORMAL HIGH (ref 7.350–7.450)
pO2, Arterial: 161 mmHg — ABNORMAL HIGH (ref 83.0–108.0)

## 2021-09-06 LAB — GLUCOSE, CAPILLARY
Glucose-Capillary: 134 mg/dL — ABNORMAL HIGH (ref 70–99)
Glucose-Capillary: 142 mg/dL — ABNORMAL HIGH (ref 70–99)
Glucose-Capillary: 210 mg/dL — ABNORMAL HIGH (ref 70–99)
Glucose-Capillary: 211 mg/dL — ABNORMAL HIGH (ref 70–99)
Glucose-Capillary: 241 mg/dL — ABNORMAL HIGH (ref 70–99)
Glucose-Capillary: 243 mg/dL — ABNORMAL HIGH (ref 70–99)

## 2021-09-06 LAB — CBC
HCT: 33.2 % — ABNORMAL LOW (ref 39.0–52.0)
Hemoglobin: 11.2 g/dL — ABNORMAL LOW (ref 13.0–17.0)
MCH: 31.5 pg (ref 26.0–34.0)
MCHC: 33.7 g/dL (ref 30.0–36.0)
MCV: 93.3 fL (ref 80.0–100.0)
Platelets: 170 10*3/uL (ref 150–400)
RBC: 3.56 MIL/uL — ABNORMAL LOW (ref 4.22–5.81)
RDW: 14.1 % (ref 11.5–15.5)
WBC: 9.1 10*3/uL (ref 4.0–10.5)
nRBC: 0 % (ref 0.0–0.2)

## 2021-09-06 LAB — BASIC METABOLIC PANEL
Anion gap: 14 (ref 5–15)
BUN: 22 mg/dL (ref 8–23)
CO2: 23 mmol/L (ref 22–32)
Calcium: 8.7 mg/dL — ABNORMAL LOW (ref 8.9–10.3)
Chloride: 96 mmol/L — ABNORMAL LOW (ref 98–111)
Creatinine, Ser: 4.36 mg/dL — ABNORMAL HIGH (ref 0.61–1.24)
GFR, Estimated: 14 mL/min — ABNORMAL LOW (ref >60)
Glucose, Bld: 300 mg/dL — ABNORMAL HIGH (ref 70–99)
Potassium: 4.1 mmol/L (ref 3.5–5.1)
Sodium: 133 mmol/L — ABNORMAL LOW (ref 135–145)

## 2021-09-06 LAB — PHENYTOIN LEVEL, TOTAL: Phenytoin Lvl: 11.2 ug/mL (ref 10.0–20.0)

## 2021-09-06 MED ORDER — CHLORHEXIDINE GLUCONATE CLOTH 2 % EX PADS
6.0000 | MEDICATED_PAD | Freq: Every day | CUTANEOUS | Status: DC
  Administered 2021-09-08 (×2): 6 via TOPICAL

## 2021-09-06 MED ORDER — AMLODIPINE BESYLATE 10 MG PO TABS
10.0000 mg | ORAL_TABLET | Freq: Every day | ORAL | Status: DC
  Administered 2021-09-06 – 2021-09-11 (×6): 10 mg
  Filled 2021-09-06 (×6): qty 1

## 2021-09-06 MED ORDER — HYDRALAZINE HCL 50 MG PO TABS
100.0000 mg | ORAL_TABLET | Freq: Three times a day (TID) | ORAL | Status: DC
  Administered 2021-09-06 – 2021-09-10 (×14): 100 mg
  Filled 2021-09-06 (×14): qty 2

## 2021-09-06 NOTE — Progress Notes (Signed)
eLink Physician-Brief Progress Note Patient Name: Jonathan Kane DOB: 11/21/1945 MRN: [id number]   Date of Service  09/06/2021  HPI/Events of Note  ABG on 30%/PRVC 16/TV 580/P 5 = 7.57/28.5/161/26.6. The patient is over breathing the ventilator set rate at 20.  eICU Interventions  Plan: Decrease TV to 450. Repeat ABG at 8 AM.     Intervention Category Major Interventions: Acid-Base disturbance - evaluation and management;Respiratory failure - evaluation and management  Lysle Dingwall 09/06/2021, 5:43 AM

## 2021-09-06 NOTE — Progress Notes (Signed)
NAME:  Jonathan Kane, MRN:  [id number], DOB:  12/24/1945, LOS: 5 ADMISSION DATE:  08/30/2021, CONSULTATION DATE:  08/29/2021 REFERRING MD:  Dr. Matilde Sprang CHIEF COMPLAINT:  Seizures  Brief Summary:  75 year old male who presented to the ED via EMS 11/16 as a code stroke.  LKN 11/15 around 8 PM.  He was found in his recliner 11/16 unresponsive with leftward gaze, left facial twitching.   On ED arrival 11/16, patient was found to be severely hypertensive with sonorous respirations.  Intubated, placed on propofol gtt.  Neurology and nephrology consulted.  Overnight EEG demonstrated profound diffuse encephalopathy likely related to sedation and right hemisphere epileptogenicity; however without definite seizures recorded.  Blood pressures returned to normal range by 4 PM (4 hours after admission with hypertensive emergency).  Was noted to have soft BP 1845-0115, propofol drip adjusted by E-link with subsequent return to normotension.   On hospital day #2, propofol was discontinued, but patient remained unresponsive. Trialled on pressure support but was placed back on full vent support due to low minute ventilation. Early in the morning of hospital day #3, was found to have two seizures on EEG prompting escalating of AEDs by Neuro.  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 Switched back to [facility]. 11/21 Increased secretions, coughing. Neuro exam stable. LTM EEG.  Interim History / Subjective:  Increased coughing and secretions this morning Suctioned with improvement in peak  pressures Plan to reattempt wean/PSV today Remains on cleviprex for BP control Neuro exam remains stable  Objective   Blood pressure (!) 166/69, pulse 86, temperature 98.7 F (37.1 C), temperature source Axillary, resp. rate 18, height 5\' 10"  (1.778 m), weight 68.3 kg, SpO2 100 %.    Vent Mode: PRVC FiO2 (%):  [30 %-40 %] 30 % Set Rate:  [16 bmp] 16 bmp Vt Set:  [450 mL-580 mL] 450 mL PEEP:  [5 cmH20] 5 cmH20 Plateau Pressure:  [17 cmH20-19 cmH20] 19 cmH20   Intake/Output Summary (Last 24 hours) at 09/06/2021 0736 Last data filed at 09/05/2021 1800 Gross per 24 hour  Intake 1731.68 ml  Output 2125 ml  Net -393.32 ml    Filed Weights   09/04/21 1250 09/05/21 1134 09/06/21 0500  Weight: 65.5 kg 67.8 kg 68.3 kg   Physical Examination: General: Acutely ill-appearing elderly man in NAD. HEENT: [place]/AT, anicteric sclera, PERRL 106mm, moist mucous membranes. LTM EEG in place. Neuro: Opens eyes briefly to noxious stimuli. Withdraws to pain in BUE/LLE, less response in RLE. Not following commands. +Corneal, +Cough, and +Gag  CV: RRR, no m/g/r. PULM: Breathing even and unlabored on vent (PEEP 5, FiO2 30%). Increased coughing, thin clear-yellow secretions on suctioning. Lung fields with coarse rhonchi bilaterally. GI: Soft, nontender, nondistended. Normoactive bowel sounds. Extremities: No LE edema noted. Skin: Warm/dry, no rashes.  Assessment & Plan:   Status Epilepticus Stable. No seizures overnight per neuro note on EEG. MRI concerning for irreversible damage. Differential seizures vs PRES.  - Neuro following, appreciate recommendations - Continue Keppra, Dilantin - Daily phenytoin levels per Neuro - LTM EEG - Additional brain imaging per Neuro - Neuroprotective measures: HOB > 30 degrees, normoglycemia, normothermia, electrolytes  WNL  Acute hypoxemic respiratory failure requiring MV - Continue full vent support (4-8cc/kg IBW) - Wean FiO2 for O2 sat > 90% - Daily WUA/SBT as  mental status permits - VAP bundle - Pulmonary hygiene - PAD protocol for sedation: Off sedation at present to assess neurologic status - F/u repeat ABG 11/21AM - Follow CXR  Fever of unknown etiology, resolved Patient afebrile last 24 hours.  - F/u UCx/Bcx - F/u sputum Cx - Empiric ceftriaxone x 5 days (end 11/23) - APAP PRN  Hypertension - Goal SBP 140-160 - Cleviprex titrated to goal SBP - Continue home diltiazem, hydralazine - Continue labetalol TID - Continue amlodipine - IV Labetalol/Hydralazine for goal SBP  Hyperglycemia Type 2 diabetes Uncontrolled at baseline. Hyperglycemia noted 11/21AM to 400s. - Continue basal levemir 24U Q12H - Continue TF coverage - SSI (moderate) - CBGs Q4H, goal 140-180 - Consider insulin gtt if unable to control glucoses 11/21  ESRD, on HD MWF History of ESRD on dialysis. - Nephrology following, appreciate recs - HD per Nephrology, last 11/20 with 2L removed - Trend BMP - Monitor I&Os - Renally dose all medications - Avoid nephrotoxic agents as able  Best Practice (right click and "Reselect all SmartList Selections" daily)   Diet/type: NPO DVT prophylaxis: prophylactic heparin  GI prophylaxis: PPI Lines: N/A Foley:  N/A Code Status:  full code Last date of multidisciplinary goals of care discussion: None yet  Critical care time: 40 minutes   Lestine Mount, PA-C [facility] 09/06/21 7:37 AM  Please see [email or web address] for pager details.  From 7A-7P if no response, please call [phone] After hours, please call ELink [phone]

## 2021-09-06 NOTE — Progress Notes (Signed)
Subjective: SBP overnight 127-181.   ROS: Unable to obtain due to poor mental status  Examination  Vital signs in last 24 hours: Temp:  [97.8 F (36.6 C)-99.5 F (37.5 C)] 98.7 F (37.1 C) (11/21 0719) Pulse Rate:  [82-106] 92 (11/21 0943) Resp:  [14-27] 16 (11/21 0815) BP: (127-181)/(55-79) 154/65 (11/21 0951) SpO2:  [97 %-100 %] 100 % (11/21 0815) FiO2 (%):  [30 %-40 %] 30 % (11/21 0845) Weight:  [67.8 kg-68.3 kg] 68.3 kg (11/21 0500)  General: lying in bed, NAD CVS: pulse-normal rate and rhythm RS: Intubated, coarse breath sounds bilaterally Extremities: Warm, no edema Neuro: opens eyes to noxious stimuli only, does not follow commands, PERRLA, corneal reflex intact, gag reflex intact, withdraws to noxious stimuli in all 4 extremities  Basic Metabolic Panel: Recent Labs  Lab 09/02/21 1428 09/02/21 1638 09/03/21 0445 09/03/21 1633 09/04/21 0709 09/04/21 1627 09/05/21 0329 09/06/21 0057 09/06/21 0442  NA 132*  --  133*  --  133*  --  131* 133* 135  K 6.0*  --  4.6  --  5.0  --  4.4 4.1 4.1  CL 97*  --  96*  --  95*  --  95* 96*  --   CO2 19*  --  21*  --  21*  --  22 23  --   GLUCOSE 262*  --  231*  --  196*  --  331* 300*  --   BUN 24*  --  33*  --  50*  --  32* 22  --   CREATININE 6.16*  --  7.32*  --  9.36*  --  6.59* 4.36*  --   CALCIUM 9.0  --  8.8*  --  8.3*  --  8.1* 8.7*  --   MG  --    < > 2.4 2.4 2.5* 2.1 2.2  --   --   PHOS  --    < > 5.1* 5.5* 4.9* 4.4 2.4*  --   --    < > = values in this interval not displayed.    CBC: Recent Labs  Lab 09/15/2021 1201 08/25/2021 1208 09/14/2021 1723 09/04/21 0709 09/05/21 0329 09/06/21 0057 09/06/21 0442  WBC 6.0  --  6.6 8.7 8.5 9.1  --   NEUTROABS 4.4  --   --   --   --   --   --   HGB 12.9*   < > 12.2* 11.0* 11.2* 11.2* 11.6*  HCT 38.3*   < > 35.2* 32.4* 33.5* 33.2* 34.0*  MCV 93.4  --  90.0 92.3 93.8 93.3  --   PLT 152  --  150 155 162 170  --    < > = values in this interval not displayed.      Coagulation Studies: No results for input(s): LABPROT, INR in the last 72 hours.  Imaging No new brain imaging overnight  ASSESSMENT AND PLAN: 75 yo M presenting with status epielpticus initially controlled on propofol. He has had some degree of PLEDs and recurrent seizures, but EEG has been improving. Unfortunately, he has extensive diffusion change on MRI.  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  Either way, given the degree of diffusion restriction, I am concerned that some of this likely represents permanent injury.   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  Additionally patient had probable HHS as well as PRES which could have provoked his seizures -He continues to be encephalopathic (only opening eyes to noxious stimuli) even after being off sedation.  Likely multifactorial due to prolonged post-ictal state, PRES, uremia   Recommendations -Continue Keppra 750 mg twice daily ( renally dosed) and phenytoin 125mg  Q8h - Likely dc ltm tomorrow if no sz overnight - if continues to be encephalopathic over then next 48-72 hours, need to discuss [place] with family -Management of rest of comorbidities per primary team  I have spent a total of   26 minutes with the patient reviewing hospital notes,  test results, labs and examining the patient as well as establishing an assessment and plan.  > 50% of time was spent in direct patient care.   Zeb Comfort Epilepsy [facility] For questions after 5pm please refer to AMION to reach the Neurologist on call

## 2021-09-06 NOTE — Progress Notes (Signed)
vLTM maintenance   no skin breakdown noted at sites  FP1  FP2  F7  F8   all impedances below 10kohms except P4  Which has been reprepped and repasted. All channels clear of artifact

## 2021-09-06 NOTE — Procedures (Addendum)
Patient Name: Jonathan Kane  MRN: [id number]  Epilepsy Attending: Lora Havens  Referring Physician/Provider: Dr Rosalin Hawking Duration: 09/05/2021 9030 to 09/06/2021 1659   Patient history:  75 y.o. 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 Krista Blue at [facility] presented to ED for forced left gaze and left facial twitching, mouth smacking, also has intermittent left UE jerking. RUE flexed with tone and intermittent right foot twitching. EEG to evaluate for seizure.    Level of alertness: lethargic   AEDs during EEG study: PHT, LEV   Technical aspects: This EEG study was done with scalp electrodes positioned according to the 10-20 International system of electrode placement. Electrical activity was acquired at a sampling rate of 500Hz  and reviewed with a high frequency filter of 70Hz  and a low frequency filter of 1Hz . EEG data were recorded continuously and digitally stored.    Description: EEG showed continuous generalized and maximal bifrontal region predominantly 6-9hz  theta-alpha activity as well as intermittent 2-3hz  generalized delta slowing admixed with 15-18Hz  generalized beta activity. Abundant spikes were noted in right hemisphere, maximal right posterior quadrant. Hyperventilation and photic stimulation were not performed.      ABNORMALITY - Spike, right hemisphere, maximal right posterior quadrant ( LPD+) - Continuous slow, generalized and maximal bifrontal   IMPRESSION: This study showed evidence of epileptogenicity arising from right hemisphere, maximal right posterior quadrant. There is also cortical dysfunction in bilateral frontal region which could be secondary to underlying encephalomalacia. Additionally there is moderate to severe diffuse encephalopathy, nonspecific etiology. No seizures  were seen during this study.   Branna Cortina [name]

## 2021-09-06 NOTE — Progress Notes (Signed)
[facility] Progress Note  Subjective: HD yest-  removed 2 liters tolerated well.  seen in ICU, off sedation but not waking up. MRI showed sig changes, see neuro note.   Vitals:   09/06/21 0500 09/06/21 0600 09/06/21 0700 09/06/21 0719  BP: (!) 164/70 (!) 166/69 (!) 153/65 (!) 165/65  Pulse: 85 86 89 91  Resp: _0 Temp:    98.7 F (37.1 C)  TempSrc:    Axillary  SpO2: 100% 100% 100% 100%  Weight: 68.3 kg     Height:        Exam: on vent ,sedated  no jvd  throat ett in place  Chest cta bilat and lat  Cor reg no RG  Abd soft ntnd no ascites   Ext no LE edema   Neuro on vent and sedated, not following commands   RIJ TDC/ LUA AVF +bruit   Home meds include - asa, lipitor, diltiazem 180, hydralazine 25 tid, norco prn, keppra 1549m bid + 5073mpost HD mwf, dilantin 75 tid, renvela 3 ac tid, sod bicarb 650 bid, tradjenta 5 qd, tresiba 22u qd, prns/ vits/ supps   67.6kg    OP HD: MWF [facility]   3h 4513m 400/1.5   2/2 bath  65.5kg  Hep 2000  RIJ TDC / LUA BC AVF placed 08/25/21 (Dr ClaCarlis Abbott - venofer 100 x 10  - mircera 60 q2, last 11/2       Assessment/ Plan: Status epilepticus - per CCM/ neuro, intubated. EEG monitoring. AED's per neuro. Seizures improved, but MRI w/ sig changes that likely represent permanent injury, per neurology. VDRF - per CCM HTN emergency - BP's high on admit, BP's better. NG hydralazine and now amlodipine and labetalol + IV clevidipine. No vol excess, at dry wt. I would say BP at goal right now around 160798stolic Volume - no vol excess, at dry wt, prob losing wt. UF goal 2 L yesterday.  ESRD - on HD MWF. HD today per holiday schedule. Sun-Tues-Fri this week. [facility] insulin g[facility], now is on SQ insulin.   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    Jonathan Kane Jonathan Kane   09/06/2021, 8:13 AM   Recent Labs  Lab 09/04/21 1627 09/05/21 0329  09/06/21 0057 09/06/21 0442  K  --  4.4 4.1 4.1  BUN  --  32* 22  --   CREATININE  --  6.59* 4.36*  --   CALCIUM  --  8.1* 8.7*  --   PHOS 4.4 2.4*  --   --   HGB  --  11.2* 11.2* 11.6*   Inpatient medications:  amLODipine  5 mg Per Tube Daily   aspirin  81 mg Per Tube Daily   atorvastatin  40 mg Per Tube Daily   chlorhexidine gluconate (MEDLINE KIT)  15 mL Mouth Rinse BID   Chlorhexidine Gluconate Cloth  6 each Topical Q0600   docusate  100 mg Per Tube BID   feeding supplement (PROSource TF)  45 mL Per Tube Daily   heparin  2,000 Units Dialysis Once in dialysis   heparin  5,000 Units Subcutaneous Q8H   hydrALAZINE  75 mg Per Tube Q8H   insulin aspart  0-15 Units Subcutaneous Q4H   insulin aspart  3 Units Subcutaneous Q4H   insulin detemir  24 Units  Subcutaneous Q12H   labetalol  100 mg Per Tube TID   mouth rinse  15 mL Mouth Rinse 10 times per day   multivitamin  1 tablet Per Tube QHS   pantoprazole (PROTONIX) IV  40 mg Intravenous Daily   polyethylene glycol  17 g Per Tube Daily    sodium chloride 10 mL/hr at 09/06/21 0700   cefTRIAXone (ROCEPHIN)  IV     clevidipine 21 mg/hr (09/06/21 0700)   feeding supplement (VITAL 1.5 CAL) 1,000 mL (09/05/21 1238)   levETIRAcetam Stopped (09/05/21 2153)   phenytoin (DILANTIN) IV Stopped (09/06/21 0651)   sodium chloride, acetaminophen (TYLENOL) oral liquid 160 mg/5 mL, dextrose, heparin, hydrALAZINE, labetalol, midazolam, sodium chloride flush

## 2021-09-06 NOTE — Progress Notes (Signed)
Pt receives out-pt HD at [facility] on MWF. Pt arrives at 11:20 for 11:40 chair time. Will assist as needed.   Lorrane Mccay Renal Navigator [phone] 

## 2021-09-07 ENCOUNTER — Inpatient Hospital Stay (HOSPITAL_COMMUNITY): Payer: Medicare Other

## 2021-09-07 DIAGNOSIS — J9602 Acute respiratory failure with hypercapnia: Secondary | ICD-10-CM | POA: Diagnosis not present

## 2021-09-07 DIAGNOSIS — G40901 Epilepsy, unspecified, not intractable, with status epilepticus: Secondary | ICD-10-CM | POA: Diagnosis not present

## 2021-09-07 DIAGNOSIS — N186 End stage renal disease: Secondary | ICD-10-CM | POA: Diagnosis not present

## 2021-09-07 LAB — CBC
HCT: 31.8 % — ABNORMAL LOW (ref 39.0–52.0)
Hemoglobin: 10.2 g/dL — ABNORMAL LOW (ref 13.0–17.0)
MCH: 30.9 pg (ref 26.0–34.0)
MCHC: 32.1 g/dL (ref 30.0–36.0)
MCV: 96.4 fL (ref 80.0–100.0)
Platelets: 181 10*3/uL (ref 150–400)
RBC: 3.3 MIL/uL — ABNORMAL LOW (ref 4.22–5.81)
RDW: 14.4 % (ref 11.5–15.5)
WBC: 10.3 10*3/uL (ref 4.0–10.5)
nRBC: 0 % (ref 0.0–0.2)

## 2021-09-07 LAB — BASIC METABOLIC PANEL
Anion gap: 13 (ref 5–15)
BUN: 41 mg/dL — ABNORMAL HIGH (ref 8–23)
CO2: 23 mmol/L (ref 22–32)
Calcium: 8.1 mg/dL — ABNORMAL LOW (ref 8.9–10.3)
Chloride: 96 mmol/L — ABNORMAL LOW (ref 98–111)
Creatinine, Ser: 6.53 mg/dL — ABNORMAL HIGH (ref 0.61–1.24)
GFR, Estimated: 8 mL/min — ABNORMAL LOW (ref >60)
Glucose, Bld: 215 mg/dL — ABNORMAL HIGH (ref 70–99)
Potassium: 5.1 mmol/L (ref 3.5–5.1)
Sodium: 132 mmol/L — ABNORMAL LOW (ref 135–145)

## 2021-09-07 LAB — PHOSPHORUS: Phosphorus: 6.3 mg/dL — ABNORMAL HIGH (ref 2.5–4.6)

## 2021-09-07 LAB — PHENYTOIN LEVEL, TOTAL: Phenytoin Lvl: 10.2 ug/mL (ref 10.0–20.0)

## 2021-09-07 LAB — GLUCOSE, CAPILLARY
Glucose-Capillary: 136 mg/dL — ABNORMAL HIGH (ref 70–99)
Glucose-Capillary: 173 mg/dL — ABNORMAL HIGH (ref 70–99)
Glucose-Capillary: 191 mg/dL — ABNORMAL HIGH (ref 70–99)
Glucose-Capillary: 220 mg/dL — ABNORMAL HIGH (ref 70–99)
Glucose-Capillary: 223 mg/dL — ABNORMAL HIGH (ref 70–99)
Glucose-Capillary: 244 mg/dL — ABNORMAL HIGH (ref 70–99)

## 2021-09-07 LAB — CULTURE, RESPIRATORY W GRAM STAIN

## 2021-09-07 LAB — MAGNESIUM: Magnesium: 2.6 mg/dL — ABNORMAL HIGH (ref 1.7–2.4)

## 2021-09-07 IMAGING — DX DG ABD PORTABLE 1V
1 series · 1 of 1 positions shown · non-contrast
Comparison: Abdominal radiograph [DATE].

CLINICAL DATA: 75-year-old male status post orogastric tube
placement.

EXAM:
PORTABLE ABDOMEN - 1 VIEW

[abdomen supine]
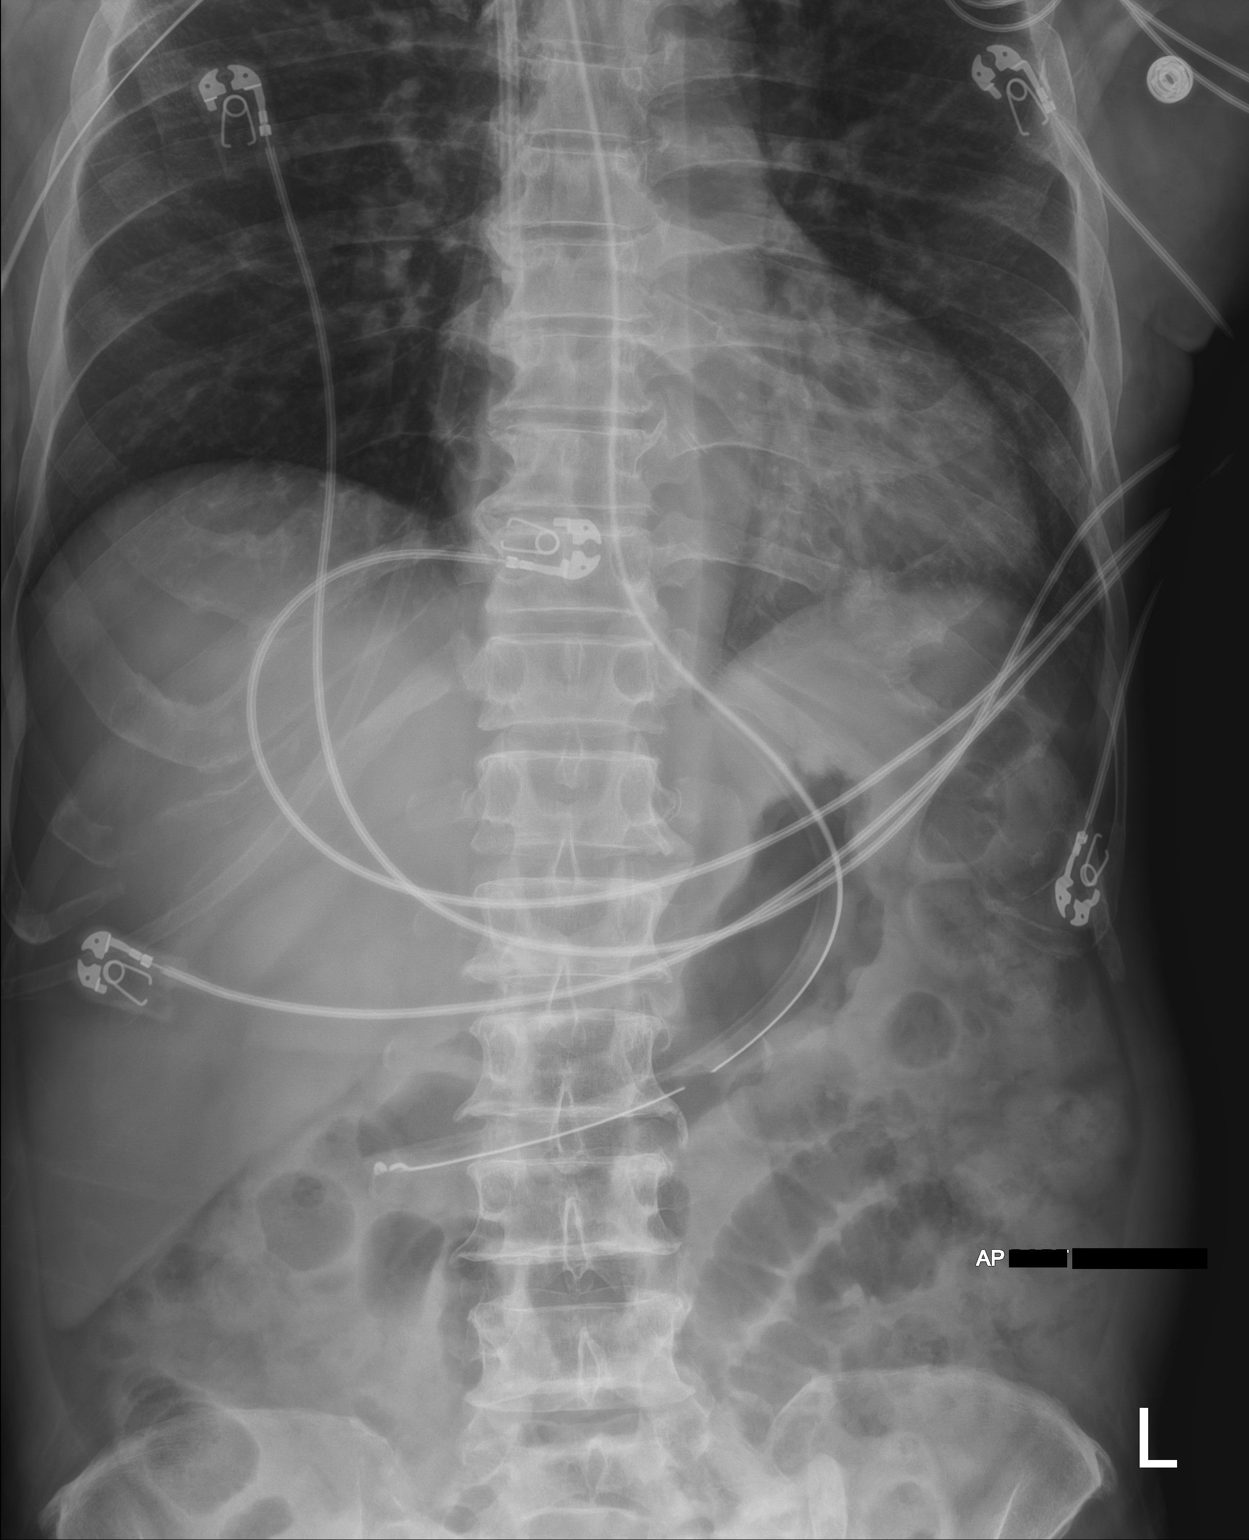

[1 of 1 positions shown; findings below may reference images not displayed]

FINDINGS: Enteric tube tip in the antral pre-pyloric region of the stomach.
Multilevel nondilated loops of gas-filled small bowel are noted in
the central abdomen. Gas and stool are also noted throughout the
visualized portions of the colon. No pneumoperitoneum.
IMPRESSION: 1. Tip of enteric tube is in the antral pre-pyloric region of the
stomach.
2. Nonspecific, nonobstructive bowel gas pattern.

## 2021-09-07 IMAGING — DX DG CHEST 1V PORT
1 series · 1 of 1 positions shown · non-contrast
Comparison: Chest x-ray [DATE].

CLINICAL DATA: Intubation.

EXAM:
PORTABLE CHEST 1 VIEW

[chest ap]
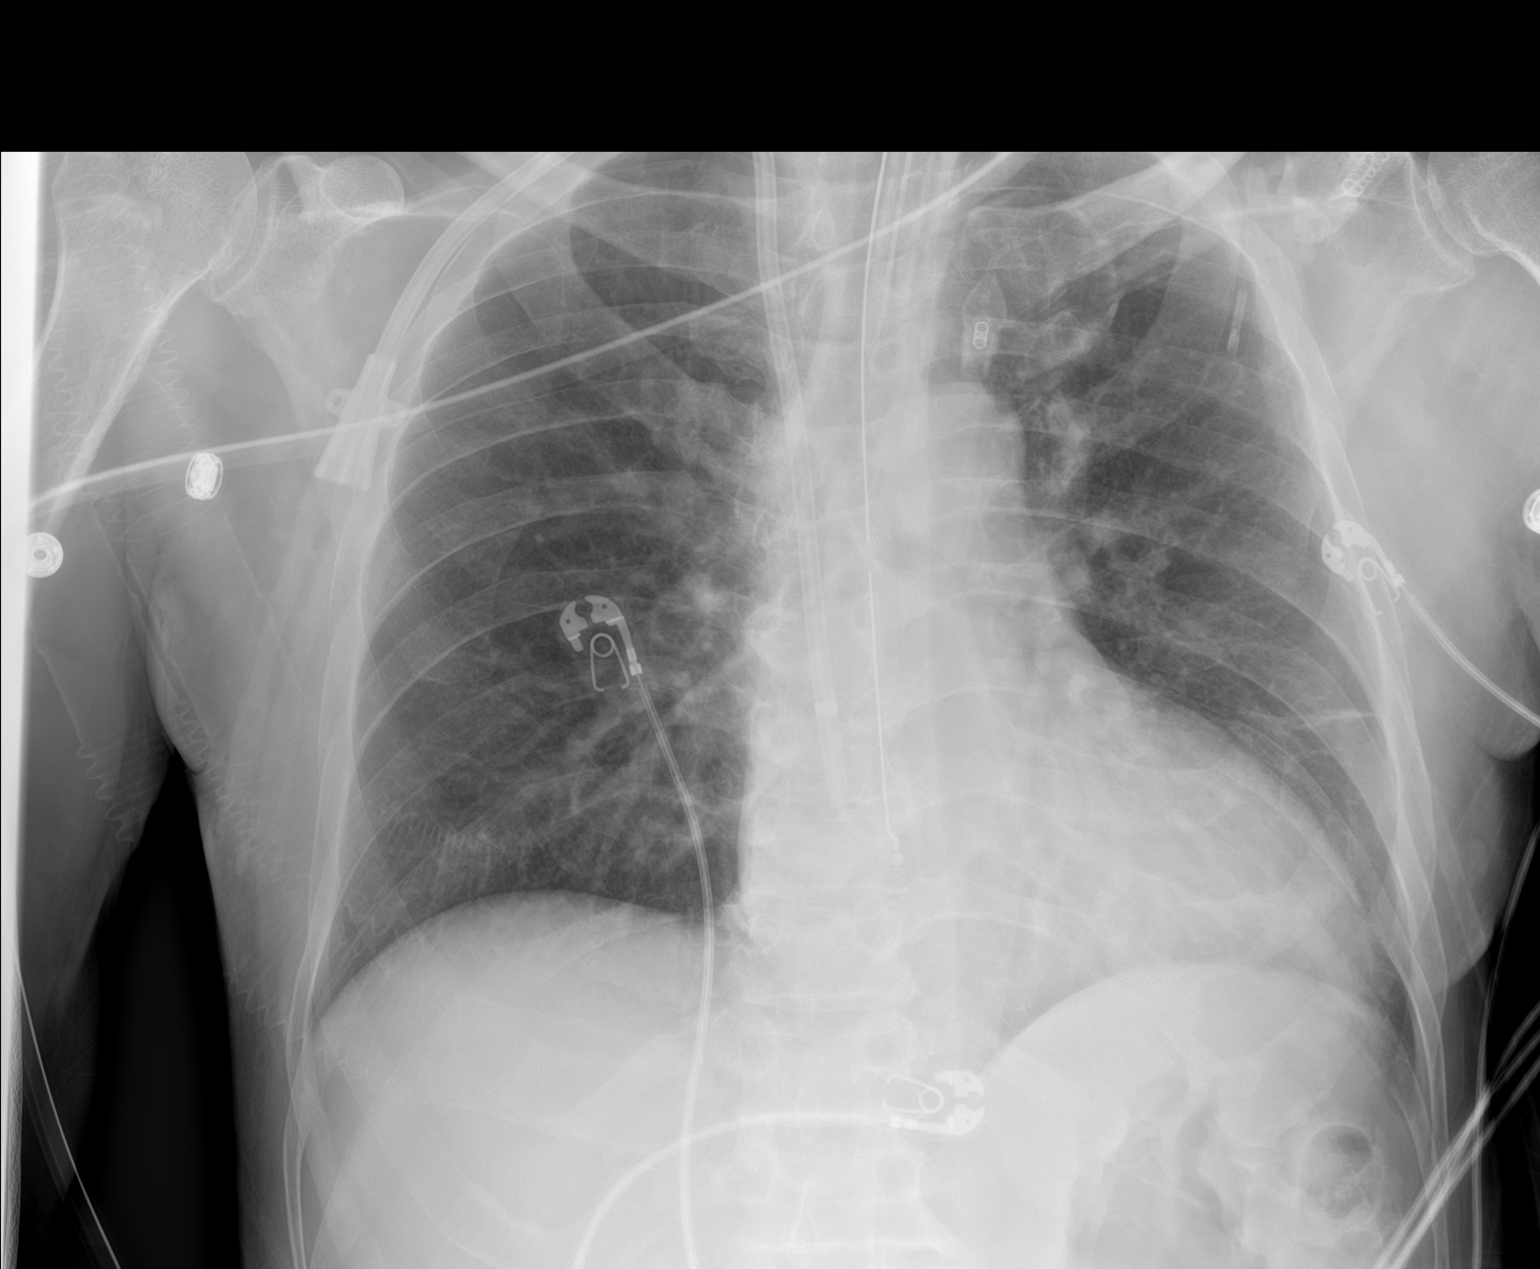

[1 of 1 positions shown; findings below may reference images not displayed]

FINDINGS: NG tube noted with its tip over the lower esophagus, advancement of
approximately 20 cm should be considered. Endotracheal tube and
dual-lumen catheter in stable position. Heart size normal. Mild left
base pleural-parenchymal thickening most consistent scarring. No
acute infiltrate. No pleural effusion or pneumothorax.
IMPRESSION: 1. NG tube noted with tip over the lower esophagus, advancement of
approximately 20 cm should be considered.

2.  Endotracheal tube and dual-lumen catheter stable position.

3.  No acute cardiopulmonary disease.

These results will be called to the ordering clinician or
representative by the Radiologist Assistant, and communication
documented in the PACS or [REDACTED].

## 2021-09-07 MED ORDER — CLONIDINE HCL 0.2 MG PO TABS
0.2000 mg | ORAL_TABLET | Freq: Two times a day (BID) | ORAL | Status: DC
Start: 2021-09-07 — End: 2021-09-11
  Administered 2021-09-07 – 2021-09-10 (×8): 0.2 mg
  Filled 2021-09-07 (×9): qty 1

## 2021-09-07 MED ORDER — CLONIDINE ORAL SUSPENSION 10 MCG/ML
0.2000 mg | Freq: Two times a day (BID) | ORAL | Status: DC
  Filled 2021-09-07: qty 20

## 2021-09-07 MED ORDER — HEPARIN SODIUM (PORCINE) 1000 UNIT/ML IJ SOLN
3800.0000 [IU] | Freq: Once | INTRAMUSCULAR | Status: AC
  Administered 2021-09-07: 3800 [IU] via INTRAVENOUS

## 2021-09-07 MED ORDER — INSULIN ASPART 100 UNIT/ML IJ SOLN
4.0000 [IU] | INTRAMUSCULAR | Status: DC
  Administered 2021-09-07 – 2021-09-11 (×19): 4 [IU] via SUBCUTANEOUS

## 2021-09-07 MED ORDER — MODAFINIL 100 MG PO TABS
100.0000 mg | ORAL_TABLET | Freq: Every day | ORAL | Status: DC
  Administered 2021-09-07 – 2021-09-11 (×5): 100 mg
  Filled 2021-09-07 (×5): qty 1

## 2021-09-07 MED ORDER — SODIUM CHLORIDE 0.9 % IV SOLN
1.0000 g | INTRAVENOUS | Status: AC
  Administered 2021-09-07 – 2021-09-08 (×2): 1 g via INTRAVENOUS
  Filled 2021-09-07 (×2): qty 10

## 2021-09-07 MED ORDER — HEPARIN SODIUM (PORCINE) 1000 UNIT/ML DIALYSIS
20.0000 [IU]/kg | INTRAMUSCULAR | Status: DC | PRN
  Administered 2021-09-07 – 2021-09-10 (×2): 1400 [IU] via INTRAVENOUS_CENTRAL
  Filled 2021-09-07: qty 2

## 2021-09-07 NOTE — Procedures (Signed)
Patient was seen on dialysis and the procedure was supervised.  BFR 400  Via TDC BP is  137/66.   Patient appears to be tolerating treatment well  Jonathan Kane 09/07/2021

## 2021-09-07 NOTE — Progress Notes (Signed)
D/C. [facility] noted. Mild skin break down noted mainly on frontal leads. Nurse was notified.

## 2021-09-07 NOTE — Progress Notes (Signed)
[facility] Progress Note  Subjective:  seen in ICU, getting HD -  only responds to pain   Vitals:   09/07/21 0718 09/07/21 0736 09/07/21 0738 09/07/21 0745  BP:   (!) 160/65 (!) 156/65  Pulse:   88 87  Resp:   15 17  Temp: 98 F (36.7 C)     TempSrc: Axillary     SpO2:  100% 100% 100%  Weight:      Height:        Exam: on vent ,sedated  no jvd  throat ett in place  Chest cta bilat and lat  Cor reg no RG  Abd soft ntnd no ascites   Ext no LE edema   Neuro on vent and sedated, not following commands   RIJ TDC/ LUA AVF +bruit   Home meds include - asa, lipitor, diltiazem 180, hydralazine 25 tid, norco prn, keppra 1531m bid + 5060mpost HD mwf, dilantin 75 tid, renvela 3 ac tid, sod bicarb 650 bid, tradjenta 5 qd, tresiba 22u qd, prns/ vits/ supps   67.6kg    OP HD: MWF [facility]   3h 4574m 400/1.5   2/2 bath  65.5kg  Hep 2000  RIJ TDC / LUA BC AVF placed 08/25/21 (Dr ClaCarlis Abbott - venofer 100 x 10  - mircera 60 q2, last 11/2       Assessment/ Plan: Status epilepticus - per CCM/ neuro, intubated. EEG monitoring. AED's per neuro. Seizures improved, but MRI w/ sig changes that likely represent permanent injury, per neurology.   VDRF - per CCM HTN emergency - BP's high on admit, BP's slightly better. NG hydralazine and now amlodipine and labetalol + IV clevidipine. No obvious vol excess,  I would say BP at goal right now around 160347stolic but would be nice to get off IV med-  challenging today with HD Volume - no obvious vol excess,  prob losing wt. UF goal 2-3 L today.  ESRD - on HD MWF. HD today per holiday schedule. Sun-Tues-Fri this week. [facility] to get back on schedule DM uncont - sp IV insulin gtt, now is on SQ insulin.   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-  no binders Dispo-  if pts MS does not improve he would no longer be a candidate for OP HD    KelLouis Meckel 09/07/2021, 7:53 AM   Recent Labs  Lab 09/05/21 0329 09/06/21 0057 09/06/21 0442 09/07/21 0218  K 4.4 4.1 4.1 5.1  BUN 32* 22  --  41*  CREATININE 6.59* 4.36*  --  6.53*  CALCIUM 8.1* 8.7*  --  8.1*  PHOS 2.4*  --   --  6.3*  HGB 11.2* 11.2* 11.6* 10.2*   Inpatient medications:  amLODipine  10 mg Per Tube Daily   aspirin  81 mg Per Tube Daily   atorvastatin  40 mg Per Tube Daily   chlorhexidine gluconate (MEDLINE KIT)  15 mL Mouth Rinse BID   Chlorhexidine Gluconate Cloth  6 each Topical Q0600   Chlorhexidine Gluconate Cloth  6 each Topical Q0600   docusate  100 mg Per Tube BID   feeding supplement (PROSource TF)  45 mL Per Tube Daily   heparin  2,000 Units Dialysis Once in dialysis   heparin  5,000 Units Subcutaneous Q8H   hydrALAZINE  100 mg  Per Tube Q8H   insulin aspart  0-15 Units Subcutaneous Q4H   insulin aspart  3 Units Subcutaneous Q4H   insulin detemir  24 Units Subcutaneous Q12H   labetalol  100 mg Per Tube TID   mouth rinse  15 mL Mouth Rinse 10 times per day   multivitamin  1 tablet Per Tube QHS   pantoprazole (PROTONIX) IV  40 mg Intravenous Daily   polyethylene glycol  17 g Per Tube Daily    sodium chloride 10 mL/hr at 09/07/21 0600   cefTRIAXone (ROCEPHIN)  IV Stopped (09/06/21 1735)   clevidipine 21 mg/hr (09/07/21 0601)   feeding supplement (VITAL 1.5 CAL) 1,000 mL (09/06/21 1520)   levETIRAcetam Stopped (09/06/21 2308)   phenytoin (DILANTIN) IV 125 mg (09/07/21 7001)   sodium chloride, acetaminophen (TYLENOL) oral liquid 160 mg/5 mL, dextrose, heparin, heparin, hydrALAZINE, labetalol, midazolam, sodium chloride flush

## 2021-09-07 NOTE — Procedures (Addendum)
Patient Name: Jonathan Kane  MRN: [id number]  Epilepsy Attending: Lora Havens  Referring Physician/Provider: Dr Rosalin Hawking Duration: 09/06/2021 8338 to 09/07/2021 1147   Patient history:  75 y.o. 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 Krista Blue at [facility] presented to ED for forced left gaze and left facial twitching, mouth smacking, also has intermittent left UE jerking. RUE flexed with tone and intermittent right foot twitching. EEG to evaluate for seizure.    Level of alertness: lethargic   AEDs during EEG study: PHT, LEV   Technical aspects: This EEG study was done with scalp electrodes positioned according to the 10-20 International system of electrode placement. Electrical activity was acquired at a sampling rate of 500Hz  and reviewed with a high frequency filter of 70Hz  and a low frequency filter of 1Hz . EEG data were recorded continuously and digitally stored.    Description: EEG showed continuous generalized and maximal bifrontal region predominantly 6-9hz  theta-alpha activity as well as intermittent 2-3hz  generalized delta slowing admixed with 15-18Hz  generalized beta activity. Frequent spikes were noted in right hemisphere, maximal right posterior quadrant. Hyperventilation and photic stimulation were not performed.      ABNORMALITY - Spike, right hemisphere, maximal right posterior quadrant ( LPD+) - Continuous slow, generalized and maximal bifrontal   IMPRESSION: This study showed evidence of epileptogenicity arising from right hemisphere, maximal right posterior quadrant. There is also cortical dysfunction in bilateral frontal region which could be secondary to underlying encephalomalacia. Additionally there is moderate diffuse encephalopathy, nonspecific etiology. No seizures  were seen during this study.   Alejandra Barna [name]

## 2021-09-07 NOTE — Progress Notes (Signed)
PCCM Interval Progress Note  Contacted patient's daughter, Clarene Critchley at [phone] to answer questions and provide an update on her father's plan of care. Discussed respiratory status and progress with ventilator today. Reviewed Neurology recommendations and current plan of care. Of note, Clarene Critchley states that her father would not want a tracheostomy and this should be considered moving forward. Advised watchful waiting for now, but recommended repeat [place] discussion at the end of the week if no progress is noted. Daughter verbalized understanding.  Advised that PCCM will continue to be available to answer any questions.  Lestine Mount, PA-C [facility] 09/07/21 4:00 PM  Please see [email or web address] for pager details.  From 7A-7P if no response, please call [phone] After hours, please call ELink [phone]

## 2021-09-07 NOTE — Progress Notes (Addendum)
Subjective: No acute events overnight. Patient continues to be difficult to arouse  ROS: Unable to obtain due to poor mental status  Examination  Vital signs in last 24 hours: Temp:  [98 F (36.7 C)-98.9 F (37.2 C)] 98 F (36.7 C) (11/22 0718) Pulse Rate:  [81-100] 96 (11/22 0926) Resp:  [11-26] 22 (11/22 0915) BP: (123-176)/(59-140) 176/62 (11/22 0915) SpO2:  [96 %-100 %] 100 % (11/22 0915) FiO2 (%):  [30 %] 30 % (11/22 0736) Weight:  [70.5 kg-80.6 kg] 70.5 kg (11/22 0715)  General: lying in bed, NAD CVS: pulse-normal rate and rhythm RS: Intubated, coarse breath sounds bilaterally Extremities: Warm, no edema Neuro: barely opens eyes to noxious stimuli, does not follow commands, PERRLA, corneal reflex intact, gag reflex intact, withdraws to noxious stimuli in all 4 extremities   Basic Metabolic Panel: Recent Labs  Lab 09/03/21 0445 09/03/21 1633 09/04/21 0709 09/04/21 1627 09/05/21 0329 09/06/21 0057 09/06/21 0442 09/07/21 0218  NA 133*  --  133*  --  131* 133* 135 132*  K 4.6  --  5.0  --  4.4 4.1 4.1 5.1  CL 96*  --  95*  --  95* 96*  --  96*  CO2 21*  --  21*  --  22 23  --  23  GLUCOSE 231*  --  196*  --  331* 300*  --  215*  BUN 33*  --  50*  --  32* 22  --  41*  CREATININE 7.32*  --  9.36*  --  6.59* 4.36*  --  6.53*  CALCIUM 8.8*  --  8.3*  --  8.1* 8.7*  --  8.1*  MG 2.4 2.4 2.5* 2.1 2.2  --   --  2.6*  PHOS 5.1* 5.5* 4.9* 4.4 2.4*  --   --  6.3*    CBC: Recent Labs  Lab 08/28/2021 1201 08/20/2021 1208 08/23/2021 1723 09/04/21 0709 09/05/21 0329 09/06/21 0057 09/06/21 0442 09/07/21 0218  WBC 6.0  --  6.6 8.7 8.5 9.1  --  10.3  NEUTROABS 4.4  --   --   --   --   --   --   --   HGB 12.9*   < > 12.2* 11.0* 11.2* 11.2* 11.6* 10.2*  HCT 38.3*   < > 35.2* 32.4* 33.5* 33.2* 34.0* 31.8*  MCV 93.4  --  90.0 92.3 93.8 93.3  --  96.4  PLT 152  --  150 155 162 170  --  181   < > = values in this interval not displayed.     Coagulation Studies: No results  for input(s): LABPROT, INR in the last 72 hours.  Imaging No new brain imaging overnight   ASSESSMENT AND PLAN: 75 yo M presenting with status epielpticus initially controlled on propofol. He has had some degree of PLEDs and recurrent seizures, but EEG has been improving. Unfortunately, he has extensive diffusion change on MRI.  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  Either way, given the degree of diffusion restriction, I am concerned that some of this likely represents permanent injury.   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  Additionally patient had probable HHS as well as PRES which could have provoked his seizures -He continues to  be encephalopathic (only opening eyes to noxious stimuli) even after being off sedation. Likely multifactorial due to prolonged post-ictal state, PRES, uremia   Recommendations -Continue Keppra 750 mg twice daily ( renally dosed) and phenytoin 125mg  Q8h - DC  LTM EEG as patient remains seizure-free -We will start modafinil 100 mg daily to promote wakefulness.  Of note this can sometimes worsen seizures therefore will keep a close eye on that -Called and updated daughter.  She states patient would not have wanted tracheostomy and feeding tube.  Therefore, if by Friday patient continues to be comatose, we will discuss goals of care again with daughter -Management of rest of comorbidities per primary team -Updated critical care team regarding my discussion with daughter   I have spent a total of  36 minutes with the patient reviewing hospital notes,  test results, labs and examining the patient as well as establishing an assessment and plan.  > 50% of time was spent in  direct patient care.   Zeb Comfort Epilepsy [facility] For questions after 5pm please refer to AMION to reach the Neurologist on call

## 2021-09-07 NOTE — Progress Notes (Signed)
Bp rechecked.  

## 2021-09-07 NOTE — Progress Notes (Signed)
NAME:  Jonathan Kane, MRN:  [id number], DOB:  Jul 23, 1946, LOS: 6 ADMISSION DATE:  09/08/2021, CONSULTATION DATE:  09/10/2021 REFERRING MD:  Dr. Matilde Sprang CHIEF COMPLAINT: Seizures  Brief Summary:  75 year old male who presented to the ED via EMS 11/16 as a code stroke.  LKN 11/15 around 8 PM.  He was found in his recliner 11/16 unresponsive with leftward gaze, left facial twitching.   On ED arrival 11/16, patient was found to be severely hypertensive with sonorous respirations.  Intubated, placed on propofol gtt.  Neurology and nephrology consulted.  Overnight EEG demonstrated profound diffuse encephalopathy likely related to sedation and right hemisphere epileptogenicity; however without definite seizures recorded.  Blood pressures returned to normal range by 4 PM (4 hours after admission with hypertensive emergency).  Was noted to have soft BP 1845-0115, propofol drip adjusted by E-link with subsequent return to normotension.   On hospital day #2, propofol was discontinued, but patient remained unresponsive. Trialled on pressure support but was placed back on full vent support due to low minute ventilation. Early in the morning of hospital day #3, was found to have two seizures on EEG prompting escalation of AEDs by Neuro.  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 Switched back to [facility]. 11/21 Increased secretions, coughing. Neuro exam stable. LTM EEG. 11/22 Secretions/respiratory needs improved. Weaning PSV 5/5. Neuro exam stable, withdrawing to noxious stimuli. Likely  d/c LTM EEG today.  Interim History / Subjective:  No significant events overnight Briefly attempted vent wean 11/21, "hiccuping" prompting switch back to [facility] Attempting PSV wean today Ongoing high cleviprex requirements HD today with plan for 2-3L off Continue ceftriaxone for klebsiella in sputum Possible d/c LTM today  Objective:  Blood pressure 137/66, pulse 88, temperature 98 F (36.7 C), temperature source Axillary, resp. rate 18, height 5\' 10"  (1.778 m), weight 70.5 kg, SpO2 100 %.    Vent Mode: PRVC FiO2 (%):  [30 %] 30 % Set Rate:  [16 bmp] 16 bmp Vt Set:  [450 mL] 450 mL PEEP:  [5 cmH20] 5 cmH20 Pressure Support:  [10 cmH20] 10 cmH20 Plateau Pressure:  [14 cmH20-16 cmH20] 16 cmH20   Intake/Output Summary (Last 24 hours) at 09/07/2021 0809 Last data filed at 09/07/2021 5638 Gross per 24 hour  Intake 2783.84 ml  Output 150 ml  Net 2633.84 ml    Filed Weights   09/06/21 0500 09/07/21 0500 09/07/21 0715  Weight: 68.3 kg 80.6 kg 70.5 kg   Physical Examination: General: Acutely ill-appearing elderly man in NAD. HEENT: /AT, anicteric sclera, PERRL 32mm, moist mucous membranes. LTM EEG in place. Neuro: Opens eyes briefly to noxious stimuli. Withdraws to pain in BUE/BLE. Not following commands.+Corneal, +Cough, and +Gag  CV: RRR, no m/g/r. PULM: Breathing even and unlabored on vent (PSV 5/5). Lung fields with coarse rhonchi bilaterally, thin clear secretions. GI: Soft, nontender, nondistended. Normoactive bowel sounds. Extremities: No LE edema noted. Skin: Warm/dry, no rashes.  Assessment & Plan:   Status Epilepticus Concern for PRES Stable. No seizures overnight per neuro note on EEG. MRI concerning for irreversible damage. Differential seizures vs PRES.  - Neuro following, appreciate recommendations - Continue Keppra,  Dilantin - Daily phenytoin levels per Neuro - LTM EEG, likely discontinue today 11/22 if no overnight seizure activity - Additional brain  imaging per Neuro - Neuroprotective measures: HOB > 30 degrees, normoglycemia, normothermia, electrolytes WNL  Acute hypoxemic respiratory failure requiring MV - Continue full vent support (4-8cc/kg IBW) - Wean FiO2 for O2 sat > 90% - Daily WUA/SBT as mental status permits; wean with SBT tolerating well 11/22 AM - VAP bundle - Pulmonary hygiene - PAD protocol for sedation: Off sedation at present to assess neurologic status - Follow CXR  Klebsiella PNA Patient afebrile last 24 hours. - F/u finalized UCx/BCx - Continue ceftriaxone x 5 days (end 11/23) for klebsiella  Hypertension - Goal SBP 140-160 - Cleviprex titrated to goal SBP - Continue amlodipine, hydralazine, labetatol - Added clonidine 0.2mg  TID (11/22) - IV Labetalol/Hydralazine for goal SBP  Hyperglycemia Type 2 diabetes Uncontrolled at baseline. Hyperglycemia noted 11/21AM to 400s. - Continue basal levemir 24U Q12H - Increase TF coverage to 4U TID - SSI (moderate) - CBGs Q4H, goal 140-180  ESRD, on HD MWF History of ESRD on dialysis. - Nephrology following, appreciate recs - HD per Nephro, tolerating well today 11/22 with goal 2-3L removal - Trend BMP - Monitor I&Os - F/u urine studies - Avoid nephrotoxic agents as able - Renally dose all medications  Best Practice: (right click and "Reselect all SmartList Selections" daily)   Diet/type: NPO DVT prophylaxis: prophylactic heparin  GI prophylaxis: PPI Lines: N/A Foley:  N/A Code Status:  full code Last date of multidisciplinary goals of care discussion: None yet  Critical care time: 38 minutes   Lestine Mount, PA-C [facility] 09/07/21 8:09 AM  Please see [email or web address] for pager details.  From 7A-7P if no response, please call [phone] After hours, please call ELink [phone]

## 2021-09-08 DIAGNOSIS — J9602 Acute respiratory failure with hypercapnia: Secondary | ICD-10-CM | POA: Diagnosis not present

## 2021-09-08 DIAGNOSIS — G40901 Epilepsy, unspecified, not intractable, with status epilepticus: Secondary | ICD-10-CM | POA: Diagnosis not present

## 2021-09-08 LAB — CBC
HCT: 30.9 % — ABNORMAL LOW (ref 39.0–52.0)
HCT: 31 % — ABNORMAL LOW (ref 39.0–52.0)
Hemoglobin: 10.2 g/dL — ABNORMAL LOW (ref 13.0–17.0)
Hemoglobin: 10 g/dL — ABNORMAL LOW (ref 13.0–17.0)
MCH: 31.9 pg (ref 26.0–34.0)
MCH: 30.9 pg (ref 26.0–34.0)
MCHC: 33 g/dL (ref 30.0–36.0)
MCHC: 32.3 g/dL (ref 30.0–36.0)
MCV: 96.6 fL (ref 80.0–100.0)
MCV: 95.7 fL (ref 80.0–100.0)
Platelets: 85 10*3/uL — ABNORMAL LOW (ref 150–400)
Platelets: 209 10*3/uL (ref 150–400)
RBC: 3.2 MIL/uL — ABNORMAL LOW (ref 4.22–5.81)
RBC: 3.24 MIL/uL — ABNORMAL LOW (ref 4.22–5.81)
RDW: 14.2 % (ref 11.5–15.5)
RDW: 14.1 % (ref 11.5–15.5)
WBC: 7 10*3/uL (ref 4.0–10.5)
WBC: 10 10*3/uL (ref 4.0–10.5)
nRBC: 0.3 % — ABNORMAL HIGH (ref 0.0–0.2)
nRBC: 0 % (ref 0.0–0.2)

## 2021-09-08 LAB — CULTURE, BLOOD (ROUTINE X 2)
Culture: NO GROWTH
Culture: NO GROWTH
Special Requests: ADEQUATE

## 2021-09-08 LAB — GLUCOSE, CAPILLARY
Glucose-Capillary: 113 mg/dL — ABNORMAL HIGH (ref 70–99)
Glucose-Capillary: 120 mg/dL — ABNORMAL HIGH (ref 70–99)
Glucose-Capillary: 128 mg/dL — ABNORMAL HIGH (ref 70–99)
Glucose-Capillary: 147 mg/dL — ABNORMAL HIGH (ref 70–99)
Glucose-Capillary: 195 mg/dL — ABNORMAL HIGH (ref 70–99)
Glucose-Capillary: 233 mg/dL — ABNORMAL HIGH (ref 70–99)

## 2021-09-08 LAB — BASIC METABOLIC PANEL
Anion gap: 14 (ref 5–15)
BUN: 27 mg/dL — ABNORMAL HIGH (ref 8–23)
CO2: 22 mmol/L (ref 22–32)
Calcium: 8.3 mg/dL — ABNORMAL LOW (ref 8.9–10.3)
Chloride: 96 mmol/L — ABNORMAL LOW (ref 98–111)
Creatinine, Ser: 4.5 mg/dL — ABNORMAL HIGH (ref 0.61–1.24)
GFR, Estimated: 13 mL/min — ABNORMAL LOW (ref >60)
Glucose, Bld: 203 mg/dL — ABNORMAL HIGH (ref 70–99)
Potassium: 5.1 mmol/L (ref 3.5–5.1)
Sodium: 132 mmol/L — ABNORMAL LOW (ref 135–145)

## 2021-09-08 LAB — PHENYTOIN LEVEL, TOTAL: Phenytoin Lvl: 6.7 ug/mL — ABNORMAL LOW (ref 10.0–20.0)

## 2021-09-08 LAB — MAGNESIUM: Magnesium: 2.3 mg/dL (ref 1.7–2.4)

## 2021-09-08 MED ORDER — OXCARBAZEPINE 150 MG PO TABS
150.0000 mg | ORAL_TABLET | Freq: Two times a day (BID) | ORAL | Status: DC
  Administered 2021-09-08 – 2021-09-11 (×6): 150 mg
  Filled 2021-09-08 (×7): qty 1

## 2021-09-08 MED ORDER — LABETALOL HCL 200 MG PO TABS
200.0000 mg | ORAL_TABLET | Freq: Three times a day (TID) | ORAL | Status: DC
  Administered 2021-09-08 (×3): 200 mg
  Filled 2021-09-08 (×2): qty 1

## 2021-09-08 MED ORDER — INSULIN DETEMIR 100 UNIT/ML ~~LOC~~ SOLN
30.0000 [IU] | SUBCUTANEOUS | Status: DC
  Administered 2021-09-08: 30 [IU] via SUBCUTANEOUS
  Filled 2021-09-08 (×2): qty 0.3

## 2021-09-08 MED ORDER — INSULIN DETEMIR 100 UNIT/ML ~~LOC~~ SOLN
24.0000 [IU] | SUBCUTANEOUS | Status: DC
  Administered 2021-09-08 – 2021-09-09 (×2): 24 [IU] via SUBCUTANEOUS
  Filled 2021-09-08 (×3): qty 0.24

## 2021-09-08 MED ORDER — CHLORHEXIDINE GLUCONATE CLOTH 2 % EX PADS
6.0000 | MEDICATED_PAD | Freq: Every day | CUTANEOUS | Status: DC
  Administered 2021-09-09: 6 via TOPICAL

## 2021-09-08 NOTE — Progress Notes (Signed)
[facility] Progress Note  Subjective:  HD yest-  removed 3000-  BP came down during tx- off of clevidipine temporarily-  back on this AM.  unfortunately no change in MS  Vitals:   09/08/21 0645 09/08/21 0700 09/08/21 0725 09/08/21 0730  BP: (!) 155/67 (!) 160/68  (!) 169/68  Pulse: 88 89  88  Resp: '15 17  17  ' Temp:   98.9 F (37.2 C)   TempSrc:   Oral   SpO2: 96% 96%  94%  Weight:      Height:        Exam: on vent , only responsive to noxious stimuli  no jvd  throat ett in place  Chest cta bilat and lat  Cor reg no RG  Abd soft ntnd no ascites   Ext no LE edema   Neuro on vent and sedated, not following commands   RIJ TDC/ LUA AVF +bruit   Home meds include - asa, lipitor, diltiazem 180, hydralazine 25 tid, norco prn, keppra 1544m bid + 5076mpost HD mwf, dilantin 75 tid, renvela 3 ac tid, sod bicarb 650 bid, tradjenta 5 qd, tresiba 22u qd, prns/ vits/ supps   67.6kg    OP HD: MWF [place]   3h 4540m 400/1.5   2/2 bath  65.5kg  Hep 2000  RIJ TDC / LUA BC AVF placed 08/25/21 (Dr ClaCarlis Abbott - venofer 100 x 10  - mircera 60 q2, last 11/2       Assessment/ Plan: Status epilepticus - per CCM/ neuro, intubated. EEG monitoring. AED's per neuro. Seizures improved, but MRI w/ sig changes that likely represent permanent injury, per neurology.   VDRF - per CCM HTN emergency - BP's high on admit, BP's better. NG hydralazine , amlodipine and labetalol had been weaned off of IV clevidipine-  now using PRN. No obvious vol excess,  -  challenging volume with HD has helped BP Volume - no obvious vol excess,  prob losing wt. UF goal 2-3 L today.  ESRD - on HD MWF. HD today per holiday schedule. Sun-Tues-Fri this week. Next on Friday to get back on schedule DM uncont - sp IV insulin gtt, now is on SQ insulin.   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   Inpatient medications:  amLODipine  10 mg Per Tube Daily   aspirin  81 mg Per Tube Daily   atorvastatin  40 mg Per Tube Daily   chlorhexidine gluconate (MEDLINE KIT)  15 mL Mouth Rinse BID   Chlorhexidine Gluconate Cloth  6 each Topical Q0600   Chlorhexidine Gluconate Cloth  6 each Topical Q0600  cloNIDine  0.2 mg Per Tube BID   docusate  100 mg Per Tube BID   feeding supplement (PROSource TF)  45 mL Per Tube Daily   heparin  2,000 Units Dialysis Once in dialysis   heparin  5,000 Units Subcutaneous Q8H   hydrALAZINE  100 mg Per Tube Q8H   insulin aspart  0-15 Units Subcutaneous Q4H   insulin aspart  4 Units Subcutaneous Q4H   insulin detemir  24 Units Subcutaneous Q12H   labetalol  100 mg Per Tube TID   mouth rinse  15 mL Mouth Rinse 10 times per day   modafinil  100 mg Per Tube Daily   multivitamin  1 tablet Per Tube QHS   pantoprazole (PROTONIX) IV  40 mg Intravenous Daily   polyethylene glycol  17 g Per Tube Daily    sodium chloride 10 mL/hr at 09/08/21 0700   cefTRIAXone (ROCEPHIN)  IV Stopped (09/07/21 1907)   clevidipine 15 mg/hr (09/08/21 0700)   feeding supplement (VITAL 1.5 CAL) 1,000 mL (09/07/21 0934)   levETIRAcetam Stopped (09/07/21 2228)   phenytoin (DILANTIN) IV Stopped (09/08/21 0612)   sodium chloride, dextrose, heparin, heparin, hydrALAZINE, labetalol, midazolam, sodium chloride flush

## 2021-09-08 NOTE — Progress Notes (Signed)
Subjective: No clinical seizures overnight.  Continues to be comatose  ROS: Unable to obtain due to poor mental status  Examination  Vital signs in last 24 hours: Temp:  [98.1 F (36.7 C)-99.7 F (37.6 C)] 99.7 F (37.6 C) (11/23 1129) Pulse Rate:  [74-94] 87 (11/23 1116) Resp:  [13-26] 16 (11/23 1116) BP: (103-169)/(55-83) 129/55 (11/23 1116) SpO2:  [94 %-99 %] 98 % (11/23 1116) FiO2 (%):  [30 %] 30 % (11/23 1116) Weight:  [66.7 kg] 66.7 kg (11/23 0500)  General: lying in bed, NAD CVS: pulse-normal rate and rhythm RS: Intubated, coarse breath sounds bilaterally Extremities: Warm, no edema Neuro: Does not open eyes to noxious stimuli, does not follow commands, PERRLA, corneal reflex intact, gag reflex intact, withdraws to noxious stimuli in all 4 extremities  Basic Metabolic Panel: Recent Labs  Lab 09/03/21 1633 09/04/21 0709 09/04/21 1627 09/05/21 0329 09/06/21 0057 09/06/21 0442 09/07/21 0218 09/08/21 0236  NA  --  133*  --  131* 133* 135 132* 132*  K  --  5.0  --  4.4 4.1 4.1 5.1 5.1  CL  --  95*  --  95* 96*  --  96* 96*  CO2  --  21*  --  22 23  --  23 22  GLUCOSE  --  196*  --  331* 300*  --  215* 203*  BUN  --  50*  --  32* 22  --  41* 27*  CREATININE  --  9.36*  --  6.59* 4.36*  --  6.53* 4.50*  CALCIUM  --  8.3*  --  8.1* 8.7*  --  8.1* 8.3*  MG 2.4 2.5* 2.1 2.2  --   --  2.6* 2.3  PHOS 5.5* 4.9* 4.4 2.4*  --   --  6.3*  --     CBC: Recent Labs  Lab 09/04/21 0709 09/05/21 0329 09/06/21 0057 09/06/21 0442 09/07/21 0218 09/08/21 0236  WBC 8.7 8.5 9.1  --  10.3 7.0  HGB 11.0* 11.2* 11.2* 11.6* 10.2* 10.2*  HCT 32.4* 33.5* 33.2* 34.0* 31.8* 30.9*  MCV 92.3 93.8 93.3  --  96.4 96.6  PLT 155 162 170  --  181 85*     Coagulation Studies: No results for input(s): LABPROT, INR in the last 72 hours.  Imaging No new brain imaging overnight   ASSESSMENT AND PLAN: 75 yo M presenting with status epielpticus initially controlled on propofol. He has had  some degree of PLEDs and recurrent seizures, but EEG has been improving. Unfortunately, he has extensive diffusion change on MRI.  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  Either way, given the degree of diffusion restriction, I am concerned that some of this likely represents permanent injury.   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  Additionally patient had probable HHS as well as PRES which could have provoked his seizures -He continues to be encephalopathic (only opening eyes to noxious stimuli) even after being off sedation. Likely multifactorial due to prolonged post-ictal state, PRES, uremia   Recommendations -Phenytoin has been difficult to maintain at therapeutic levels.  Therefore, will switch to oxcarbazepine 150 mg twice daily -Continue Keppra 750 mg twice daily ( renally dosed)  -Continue modafinil 100 mg daily to promote wakefulness.  Of note this can sometimes worsen seizures therefore will keep a close eye on that -Called and updated daughter on 09/07/2021.  She states patient would not have wanted tracheostomy and feeding tube.  Therefore, if by Friday patient continues to be comatose, we will discuss goals of care again with daughter -Management of rest of comorbidities per primary team -Updated critical care team regarding my discussion with daughter   I have spent a total of  35 minutes with the patient reviewing hospital notes,  test results, labs and examining the patient as well as establishing an assessment and plan.  > 50% of time was spent in direct patient care.  Zeb Comfort Epilepsy [facility] For questions after 5pm please refer to AMION to reach the  Neurologist on call

## 2021-09-08 NOTE — Progress Notes (Addendum)
NAME:  Jonathan Kane, MRN:  [id number], DOB:  August 01, 1946, LOS: 7 ADMISSION DATE:  08/29/2021, CONSULTATION DATE:  09/14/2021 REFERRING MD:  Dr. Matilde Sprang CHIEF COMPLAINT: Seizures  Brief Summary:  75 year old male who presented to the ED via EMS 11/16 as a code stroke.  LKN 11/15 around 8 PM.  He was found in his recliner 11/16 unresponsive with leftward gaze, left facial twitching.   On ED arrival 11/16, patient was found to be severely hypertensive with sonorous respirations.  Intubated, placed on propofol gtt.  Neurology and nephrology consulted.  Overnight EEG demonstrated profound diffuse encephalopathy likely related to sedation and right hemisphere epileptogenicity; however without definite seizures recorded.  Blood pressures returned to normal range by 4 PM (4 hours after admission with hypertensive emergency).  Was noted to have soft BP 1845-0115, propofol drip adjusted by E-link with subsequent return to normotension.   On hospital day #2, propofol was discontinued, but patient remained unresponsive. Trialled on pressure support but was placed back on full vent support due to low minute ventilation. Early in the morning of hospital day #3, was found to have two seizures on EEG prompting escalation of AEDs by Neuro.  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 Switched back to [facility]. 11/21 Increased secretions, coughing. Neuro exam stable. LTM EEG. 11/22 Secretions/respiratory needs improved. Weaning PSV 5/5. Neuro exam stable, withdrawing to noxious stimuli. Likely  d/c LTM EEG today.  Interim History / Subjective:   Has tolerated some PSV No significant change in neurological status.  Modafinil started on 11/22 [place] discussion from 11/22 noted, patient would not want trach, PEG I/O4 0.8 L total, 3 L removed with HD on 11/22  Objective:  Blood pressure (!) 160/68, pulse 89, temperature 98.6 F (37 C), temperature source Oral, resp. rate 17, height 5\' 10"  (1.778 m), weight 66.7 kg, SpO2 96 %.    Vent Mode: PRVC FiO2 (%):  [30 %] 30 % Set Rate:  [16 bmp] 16 bmp Vt Set:  [450 mL] 450 mL PEEP:  [5 cmH20] 5 cmH20 Pressure Support:  [5 cmH20] 5 cmH20 Plateau Pressure:  [16 cmH20] 16 cmH20   Intake/Output Summary (Last 24 hours) at 09/08/2021 0743 Last data filed at 09/08/2021 0200 Gross per 24 hour  Intake 1965.88 ml  Output 3000 ml  Net -1034.12 ml   Filed Weights   09/07/21 0715 09/07/21 1130 09/08/21 0500  Weight: 70.5 kg 67.6 kg 66.7 kg   Physical Examination: General: Ill-appearing man, ventilated HEENT: ET tube in place, pupils equal Neuro: Slight grimace to pain.  Did not open eyes.  Withdraws upper and lower extremities.  Does not follow commands, does not interact in any way.  No sedation CV: Regular, no murmur PULM: Comfortable respiratory pattern on PSV 5.  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 No seizures overnight per neuro note on EEG. MRI concerning for irreversible damage. Differential seizures vs PRES.  Appreciate neurology assistance.  Modafinil added on 11/22 Continue Keppra, Dilantin and follow levels He was EEG discontinued  Neuroprotective measures as ordered  Acute hypoxemic respiratory failure requiring MV PRVC.  Okay for PSV but he does not have the mental status for extubation. PAD as ordered, minimizing all sedation (off) Pulmonary hygiene VAP  prevention orders Note goals of care discussions with patient's sister.  Patient would not want trach, PEG, long-term care.  If no improvement in neurological status then will need to discuss transition to comfort   Klebsiella PNA Completes ceftriaxone on 11/23  Hypertension Goal SBP 140 Remains on Cleviprex Remains on enteral amlodipine, hydralazine, labetalol Clonidine added on 11/22 IV labetalol and hydralazine as needed   Hyperglycemia Type 2 diabetes Increase basal Levemir to 30 qam on 11/23, continue 24u in evening Continue tube feeding coverage 4 units 3 times daily Moderate sliding scale insulin protocol   ESRD, on HD MWF History of ESRD on dialysis. Appreciate nephrology assistance Last HD was done on 11/22 Following BMP Medications renally dosed  Thrombocytopenia Significant drop 181 > 85 Recheck pm. If accurate, continues to drop then will need to stop heparinoids, check DIC and HITT panels    Best Practice: (right click and "Reselect all SmartList Selections" daily)   Diet/type: NPO DVT prophylaxis: prophylactic heparin  GI prophylaxis: PPI Lines: N/A Foley:  N/A Code Status:  full code Last date of multidisciplinary goals of care discussion: [place] discussion with pt's sister on 11/22, clear that pt would not want trach/PEG. Will need to consider comfort care if no improvement neuro status   Critical care time:  33 minutes    Baltazar Apo, MD, PhD 09/08/2021, 7:43 AM [facility] [phone] or if no answer before 7:00PM call [phone] For any issues after 7:00PM please call eLink [phone]

## 2021-09-09 DIAGNOSIS — J9602 Acute respiratory failure with hypercapnia: Secondary | ICD-10-CM | POA: Diagnosis not present

## 2021-09-09 DIAGNOSIS — G40901 Epilepsy, unspecified, not intractable, with status epilepticus: Secondary | ICD-10-CM | POA: Diagnosis not present

## 2021-09-09 DIAGNOSIS — N186 End stage renal disease: Secondary | ICD-10-CM | POA: Diagnosis not present

## 2021-09-09 DIAGNOSIS — E43 Unspecified severe protein-calorie malnutrition: Secondary | ICD-10-CM | POA: Diagnosis not present

## 2021-09-09 LAB — CBC
HCT: 33 % — ABNORMAL LOW (ref 39.0–52.0)
Hemoglobin: 10.8 g/dL — ABNORMAL LOW (ref 13.0–17.0)
MCH: 31 pg (ref 26.0–34.0)
MCHC: 32.7 g/dL (ref 30.0–36.0)
MCV: 94.8 fL (ref 80.0–100.0)
Platelets: 202 10*3/uL (ref 150–400)
RBC: 3.48 MIL/uL — ABNORMAL LOW (ref 4.22–5.81)
RDW: 14.1 % (ref 11.5–15.5)
WBC: 9.2 10*3/uL (ref 4.0–10.5)
nRBC: 0 % (ref 0.0–0.2)

## 2021-09-09 LAB — BASIC METABOLIC PANEL
Anion gap: 15 (ref 5–15)
BUN: 46 mg/dL — ABNORMAL HIGH (ref 8–23)
CO2: 23 mmol/L (ref 22–32)
Calcium: 8.2 mg/dL — ABNORMAL LOW (ref 8.9–10.3)
Chloride: 95 mmol/L — ABNORMAL LOW (ref 98–111)
Creatinine, Ser: 6.42 mg/dL — ABNORMAL HIGH (ref 0.61–1.24)
GFR, Estimated: 9 mL/min — ABNORMAL LOW (ref >60)
Glucose, Bld: 75 mg/dL (ref 70–99)
Potassium: 5.8 mmol/L — ABNORMAL HIGH (ref 3.5–5.1)
Sodium: 133 mmol/L — ABNORMAL LOW (ref 135–145)

## 2021-09-09 LAB — POTASSIUM: Potassium: 6.3 mmol/L (ref 3.5–5.1)

## 2021-09-09 LAB — GLUCOSE, CAPILLARY
Glucose-Capillary: 79 mg/dL (ref 70–99)
Glucose-Capillary: 76 mg/dL (ref 70–99)
Glucose-Capillary: 86 mg/dL (ref 70–99)
Glucose-Capillary: 184 mg/dL — ABNORMAL HIGH (ref 70–99)
Glucose-Capillary: 202 mg/dL — ABNORMAL HIGH (ref 70–99)
Glucose-Capillary: 110 mg/dL — ABNORMAL HIGH (ref 70–99)
Glucose-Capillary: 73 mg/dL (ref 70–99)

## 2021-09-09 LAB — MAGNESIUM: Magnesium: 2.5 mg/dL — ABNORMAL HIGH (ref 1.7–2.4)

## 2021-09-09 MED ORDER — DEXTROSE 50 % IV SOLN
1.0000 | Freq: Once | INTRAVENOUS | Status: AC
  Administered 2021-09-09: 50 mL via INTRAVENOUS
  Filled 2021-09-09: qty 50

## 2021-09-09 MED ORDER — LABETALOL HCL 200 MG PO TABS
300.0000 mg | ORAL_TABLET | Freq: Three times a day (TID) | ORAL | Status: DC
  Administered 2021-09-09 – 2021-09-10 (×4): 300 mg
  Filled 2021-09-09 (×4): qty 1

## 2021-09-09 MED ORDER — CALCIUM GLUCONATE-NACL 1-0.675 GM/50ML-% IV SOLN
1.0000 g | Freq: Once | INTRAVENOUS | Status: AC
  Administered 2021-09-09: 1000 mg via INTRAVENOUS
  Filled 2021-09-09: qty 50

## 2021-09-09 MED ORDER — INSULIN ASPART 100 UNIT/ML IV SOLN
5.0000 [IU] | Freq: Once | INTRAVENOUS | Status: AC
  Administered 2021-09-09: 5 [IU] via INTRAVENOUS

## 2021-09-09 MED ORDER — INSULIN DETEMIR 100 UNIT/ML ~~LOC~~ SOLN
24.0000 [IU] | SUBCUTANEOUS | Status: DC
  Filled 2021-09-09 (×2): qty 0.24

## 2021-09-09 MED ORDER — SODIUM ZIRCONIUM CYCLOSILICATE 10 G PO PACK
10.0000 g | PACK | Freq: Once | ORAL | Status: AC
  Administered 2021-09-09: 10 g
  Filled 2021-09-09: qty 1

## 2021-09-09 NOTE — Progress Notes (Signed)
NAME:  Jonathan Kane, MRN:  [id number], DOB:  [birth year], LOS: 4 ADMISSION DATE:  09/02/2021, CONSULTATION DATE:  11/16 REFERRING MD:  Kommor, CHIEF COMPLAINT:  Seizure   History of Present Illness:  75 y/o male presented to the ER unresponsive with a clinical picture worrisome for status epilepticus.  He required intubaiton and sedation on admission.   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 Hyperlipidemia  Significant Hospital Events: Including procedures, antibiotic start and stop dates in addition to other pertinent events   11/16 Admitted, intubated, sedated on propofol 11/17 O/n EEG demonstrates profound diffuse encephalopathy and right hemisphere epileptogenicity 11/18 two seizures overnight, MRI brain > extensive cortically based diffuse restriction and gyrla swelling throughout both cerbral an cerebellar hemispheres form seizure, less likely PRES, unchanged baseline encephalomalacia bilateral anterior frontal and left anterior temporal lobes 11/19 no seizures on EEG 11/20 PSV attempted, "hiccuping" per RT. Switched back to [facility]. 11/21 Increased secretions, coughing. Neuro exam stable. LTM EEG. 11/22 Secretions/respiratory needs improved. Weaning PSV 5/5. Neuro exam stable, withdrawing to noxious stimuli. Likely d/c LTM EEG today.  Abx 11/19 ceftriaxone > 11/23  Proc 11/16 ett >  R IJ HD cath prior to admission >   Micro 11/16 sars cov 2 /flu > neg 11/18 resp culture > klebsiella pneumonia 11/18 blood culture >  11/18 urine culture >   Interim History / Subjective:   Tolerating PSV, withdraws to noxious stimuli, but not following commands Remains on cleviprex infusion  Objective   Blood pressure (!) 160/72, pulse 86, temperature 98.5 F (36.9 C), temperature source Axillary, resp. rate 16, height 5\' 10"  (1.778 m), weight 69.2 kg, SpO2 97 %.    Vent  Mode: PRVC FiO2 (%):  [30 %] 30 % Set Rate:  [16 bmp] 16 bmp Vt Set:  [450 mL] 450 mL PEEP:  [5 cmH20] 5 cmH20 Pressure Support:  [5 cmH20] 5 cmH20 Plateau Pressure:  [14 cmH20] 14 cmH20   Intake/Output Summary (Last 24 hours) at 09/09/2021 0745 Last data filed at 09/09/2021 0700 Gross per 24 hour  Intake 2671.49 ml  Output 325 ml  Net 2346.49 ml   Filed Weights   09/07/21 1130 09/08/21 0500 09/09/21 0500  Weight: 67.6 kg 66.7 kg 69.2 kg    Examination:  General:  In bed on vent HENT: NCAT ETT in place PULM: CTA B, vent supported breathing CV: RRR, no mgr GI: BS+, soft, nontender MSK: normal bulk and tone Neuro: withdraws to noxious stimuli, doesn't open eyes to voice or any tactile [facility] Problem list   Thrombocytopenia > resolved  Assessment & Plan:  Status epilepticus Persistent acute metabolic encephalopathy after seizures PRES Baseline TBI Modafinil started by neurology Continue keppra/phenytoin Repeat EEG per neuro Minimize sedatives  Acute hypoxemic respiratory failure Full mechanical vent support VAP prevention Daily WUA/SBT > pressure support as long as tolerated  Klebsiella PNA Monitor off antibiotics  Hypertension Continue clonidine (reluctant to increase with mental status change) Continue amlodipine Increase labetalol Continue cleviprex goal SBP < 180  DM2 Levemir ssi  ESRD on HD MWF Hyperkalemia HD per renal Lokelma now  Goals of care: plan to discuss again on 11/24  Best Practice (right click and "Reselect all SmartList Selections" daily)   Diet/type: tubefeeds DVT prophylaxis: prophylactic heparin  GI prophylaxis: PPI Lines: Dialysis Catheter and yes and it is still needed Foley:  N/A Code Status:  full code Last date of multidisciplinary goals of care discussion [11/23, plan discussion again on 11/24 ]  Labs   CBC: Recent Labs  Lab 09/06/21 0057 09/06/21 0442 09/07/21 0218 09/08/21 0236  09/08/21 1815 09/09/21 0238  WBC 9.1  --  10.3 7.0 10.0 9.2  HGB 11.2* 11.6* 10.2* 10.2* 10.0* 10.8*  HCT 33.2* 34.0* 31.8* 30.9* 31.0* 33.0*  MCV 93.3  --  96.4 96.6 95.7 94.8  PLT 170  --  181 85* 209 355    Basic Metabolic Panel: Recent Labs  Lab 09/03/21 1633 09/04/21 0709 09/04/21 1627 09/05/21 0329 09/06/21 0057 09/06/21 0442 09/07/21 0218 09/08/21 0236 09/09/21 0238  NA  --  133*  --  131* 133* 135 132* 132* 133*  K  --  5.0  --  4.4 4.1 4.1 5.1 5.1 5.8*  CL  --  95*  --  95* 96*  --  96* 96* 95*  CO2  --  21*  --  22 23  --  23 22 23   GLUCOSE  --  196*  --  331* 300*  --  215* 203* 75  BUN  --  50*  --  32* 22  --  41* 27* 46*  CREATININE  --  9.36*  --  6.59* 4.36*  --  6.53* 4.50* 6.42*  CALCIUM  --  8.3*  --  8.1* 8.7*  --  8.1* 8.3* 8.2*  MG 2.4 2.5* 2.1 2.2  --   --  2.6* 2.3 2.5*  PHOS 5.5* 4.9* 4.4 2.4*  --   --  6.3*  --   --    GFR: Estimated Creatinine Clearance: 10 mL/min (A) (by C-G formula based on SCr of 6.42 mg/dL (H)). Recent Labs  Lab 09/07/21 0218 09/08/21 0236 09/08/21 1815 09/09/21 0238  WBC 10.3 7.0 10.0 9.2    Liver Function Tests: No results for input(s): AST, ALT, ALKPHOS, BILITOT, PROT, ALBUMIN in the last 168 hours. No results for input(s): LIPASE, AMYLASE in the last 168 hours. No results for input(s): AMMONIA in the last 168 hours.  ABG    Component Value Date/Time   PHART 7.577 (H) 09/06/2021 0442   PCO2ART 28.5 (L) 09/06/2021 0442   PO2ART 161 (H) 09/06/2021 0442   HCO3 26.6 09/06/2021 0442   TCO2 27 09/06/2021 0442   ACIDBASEDEF 1.0 09/07/2021 1552   O2SAT 100.0 09/06/2021 0442     Coagulation Profile: No results for input(s): INR, PROTIME in the last 168 hours.  Cardiac Enzymes: No results for input(s): CKTOTAL, CKMB, CKMBINDEX, TROPONINI in the last 168 hours.  HbA1C: Hgb A1c MFr Bld  Date/Time Value Ref Range Status  08/08/2021 02:25 AM 10.6 (H) 4.8 - 5.6 % Final    Comment:    (NOTE) Pre diabetes:           5.7%-6.4%  Diabetes:              >6.4%  Glycemic control for   <7.0% adults with diabetes   06/01/2021 03:14 AM 11.5 (H) 4.8 - 5.6 % Final    Comment:    (NOTE) Pre diabetes:          5.7%-6.4%  Diabetes:              >6.4%  Glycemic control for   <7.0% adults with diabetes     CBG: Recent Labs  Lab 09/08/21 1944 09/08/21 2339 09/09/21 0321 09/09/21 0541 09/09/21 0710  GLUCAP 147* 113* 79 76 86  Critical care time: 35 minutes     Roselie Awkward, MD [facility] Pager: [phone] Cell: [phone] After 7:00 pm call Elink  [phone]

## 2021-09-09 NOTE — Progress Notes (Signed)
[facility] Progress Note  Subjective:  clevidipine titrated up.  unfortunately no change in MS-  K is 5.8 this AM- to be given lokelma  Vitals:   09/09/21 0645 09/09/21 0700 09/09/21 0711 09/09/21 0827  BP: (!) 162/70 (!) 160/72  (!) 176/70  Pulse: 87 86    Resp: 16 16    Temp:   98.5 F (36.9 C)   TempSrc:   Axillary   SpO2: 97% 97%    Weight:      Height:        Exam: on vent , only responsive to noxious stimuli  no jvd  throat ett in place  Chest cta bilat and lat  + tdc  Cor reg no RG  Abd soft ntnd no ascites   Ext no LE edema   Neuro on vent and sedated, not following commands   RIJ TDC/ LUA AVF +bruit   Home meds include - asa, lipitor, diltiazem 180, hydralazine 25 tid, norco prn, keppra 152m bid + 5049mpost HD mwf, dilantin 75 tid, renvela 3 ac tid, sod bicarb 650 bid, tradjenta 5 qd, tresiba 22u qd, prns/ vits/ supps   67.6kg    OP HD: MWF [facility]   3h 4572m 400/1.5   2/2 bath  65.5kg  Hep 2000  RIJ TDC / LUA BC AVF placed 08/25/21 (Dr ClaCarlis Abbott - venofer 100 x 10  - mircera 60 q2, last 11/2       Assessment/ Plan: Status epilepticus - per CCM/ neuro, intubated. EEG monitoring. AED's per neuro. Seizures improved, but MRI w/ sig changes that likely represent permanent injury, per neurology.   VDRF - per CCM-  possible extubation today  HTN emergency - BP's high on admit. NG hydralazine , amlodipine and labetalol and now clonidine- had been weaned off of IV clevidipine-  now using PRN and needing titrating up. No obvious vol excess,  -  challenging volume with HD has helped BP at least during treatments Volume - no obvious vol excess,  prob losing wt. UF goal 2-3 L per HD  ESRD - on HD MWF. HD today per holiday schedule. Sun-Tues-Fri this week. Next on Friday to get back on schedule- lokelma today to hold him over DM uncont - sp IV insulin gtt, now is on SQ insulin.   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-  no  binders Dispo-  daughter saying that patient would not want trach and PEG-  so if no strides by end of week will re address goals of care-  would not be a candidate for OP dialysis in this state     [place]11/24/2022, 9:19 AM   Recent Labs  Lab 09/05/21 0329 09/06/21 0057 09/07/21 0218 09/08/21 0236 09/08/21 1815 09/09/21 0238  K 4.4   < > 5.1 5.1  --  5.8*  BUN 32*   < > 41* 27*  --  46*  CREATININE 6.59*   < > 6.53* 4.50*  --  6.42*  CALCIUM 8.1*   < > 8.1* 8.3*  --  8.2*  PHOS 2.4*  --  6.3*  --   --   --   HGB 11.2*   < > 10.2* 10.2* 10.0* 10.8*   < > = values in this interval not displayed.   Inpatient medications:  amLODipine  10 mg Per Tube Daily   aspirin  81 mg  Per Tube Daily   atorvastatin  40 mg Per Tube Daily   chlorhexidine gluconate (MEDLINE KIT)  15 mL Mouth Rinse BID   Chlorhexidine Gluconate Cloth  6 each Topical Q0600   Chlorhexidine Gluconate Cloth  6 each Topical Q0600   Chlorhexidine Gluconate Cloth  6 each Topical Q0600   cloNIDine  0.2 mg Per Tube BID   docusate  100 mg Per Tube BID   feeding supplement (PROSource TF)  45 mL Per Tube Daily   heparin  2,000 Units Dialysis Once in dialysis   heparin  5,000 Units Subcutaneous Q8H   hydrALAZINE  100 mg Per Tube Q8H   insulin aspart  0-15 Units Subcutaneous Q4H   insulin aspart  4 Units Subcutaneous Q4H   insulin detemir  24 Units Subcutaneous Q24H   insulin detemir  24 Units Subcutaneous Q24H   labetalol  200 mg Per Tube TID   mouth rinse  15 mL Mouth Rinse 10 times per day   modafinil  100 mg Per Tube Daily   multivitamin  1 tablet Per Tube QHS   OXcarbazepine  150 mg Per Tube BID   pantoprazole (PROTONIX) IV  40 mg Intravenous Daily   polyethylene glycol  17 g Per Tube Daily   sodium zirconium cyclosilicate  10 g Per Tube Once    sodium chloride 10 mL/hr at 09/09/21 0700   clevidipine 11 mg/hr (09/09/21 0809)   feeding supplement (VITAL 1.5 CAL) 1,000 mL (09/09/21 0600)    levETIRAcetam Stopped (09/08/21 2215)   sodium chloride, dextrose, heparin, heparin, hydrALAZINE, labetalol, midazolam, sodium chloride flush

## 2021-09-09 NOTE — Progress Notes (Signed)
eLink Physician-Brief Progress Note Patient Name: Jonathan Kane DOB: July 31, 1946 MRN: [id number]   Date of Service  09/09/2021  HPI/Events of Note  K is 5.8 on BMP. Planned for HD on Friday.  eICU Interventions  Check glucose Low dose insulin and D50 x 1 ordered Repeat glucose check ordered post insulin  Calcium gluc x 1 Repeat K at 730 am ordered     Intervention Category Major Interventions: Electrolyte abnormality - evaluation and management  Clorene Nerio G Hai Grabe 09/09/2021, 4:07 AM

## 2021-09-10 ENCOUNTER — Inpatient Hospital Stay (HOSPITAL_COMMUNITY): Payer: Medicare Other

## 2021-09-10 DIAGNOSIS — E43 Unspecified severe protein-calorie malnutrition: Secondary | ICD-10-CM | POA: Diagnosis not present

## 2021-09-10 DIAGNOSIS — G40901 Epilepsy, unspecified, not intractable, with status epilepticus: Secondary | ICD-10-CM | POA: Diagnosis not present

## 2021-09-10 DIAGNOSIS — N186 End stage renal disease: Secondary | ICD-10-CM | POA: Diagnosis not present

## 2021-09-10 DIAGNOSIS — J9602 Acute respiratory failure with hypercapnia: Secondary | ICD-10-CM | POA: Diagnosis not present

## 2021-09-10 LAB — COMPREHENSIVE METABOLIC PANEL
ALT: 49 U/L — ABNORMAL HIGH (ref 0–44)
AST: 51 U/L — ABNORMAL HIGH (ref 15–41)
Albumin: 2.7 g/dL — ABNORMAL LOW (ref 3.5–5.0)
Alkaline Phosphatase: 97 U/L (ref 38–126)
Anion gap: 17 — ABNORMAL HIGH (ref 5–15)
BUN: 67 mg/dL — ABNORMAL HIGH (ref 8–23)
CO2: 21 mmol/L — ABNORMAL LOW (ref 22–32)
Calcium: 8.2 mg/dL — ABNORMAL LOW (ref 8.9–10.3)
Chloride: 93 mmol/L — ABNORMAL LOW (ref 98–111)
Creatinine, Ser: 8.25 mg/dL — ABNORMAL HIGH (ref 0.61–1.24)
GFR, Estimated: 6 mL/min — ABNORMAL LOW (ref >60)
Glucose, Bld: 248 mg/dL — ABNORMAL HIGH (ref 70–99)
Potassium: 7.3 mmol/L (ref 3.5–5.1)
Sodium: 131 mmol/L — ABNORMAL LOW (ref 135–145)
Total Bilirubin: 0.6 mg/dL (ref 0.3–1.2)
Total Protein: 6.9 g/dL (ref 6.5–8.1)

## 2021-09-10 LAB — BASIC METABOLIC PANEL
Anion gap: 18 — ABNORMAL HIGH (ref 5–15)
BUN: 70 mg/dL — ABNORMAL HIGH (ref 8–23)
CO2: 20 mmol/L — ABNORMAL LOW (ref 22–32)
Calcium: 8.3 mg/dL — ABNORMAL LOW (ref 8.9–10.3)
Chloride: 91 mmol/L — ABNORMAL LOW (ref 98–111)
Creatinine, Ser: 8.42 mg/dL — ABNORMAL HIGH (ref 0.61–1.24)
GFR, Estimated: 6 mL/min — ABNORMAL LOW (ref >60)
Glucose, Bld: 299 mg/dL — ABNORMAL HIGH (ref 70–99)
Potassium: 6.5 mmol/L (ref 3.5–5.1)
Sodium: 129 mmol/L — ABNORMAL LOW (ref 135–145)

## 2021-09-10 LAB — CBC
HCT: 30.3 % — ABNORMAL LOW (ref 39.0–52.0)
Hemoglobin: 10.1 g/dL — ABNORMAL LOW (ref 13.0–17.0)
MCH: 31.7 pg (ref 26.0–34.0)
MCHC: 33.3 g/dL (ref 30.0–36.0)
MCV: 95 fL (ref 80.0–100.0)
Platelets: 203 10*3/uL (ref 150–400)
RBC: 3.19 MIL/uL — ABNORMAL LOW (ref 4.22–5.81)
RDW: 14.1 % (ref 11.5–15.5)
WBC: 8.8 10*3/uL (ref 4.0–10.5)
nRBC: 0 % (ref 0.0–0.2)

## 2021-09-10 LAB — GLUCOSE, CAPILLARY
Glucose-Capillary: 210 mg/dL — ABNORMAL HIGH (ref 70–99)
Glucose-Capillary: 250 mg/dL — ABNORMAL HIGH (ref 70–99)
Glucose-Capillary: 287 mg/dL — ABNORMAL HIGH (ref 70–99)
Glucose-Capillary: 250 mg/dL — ABNORMAL HIGH (ref 70–99)
Glucose-Capillary: 116 mg/dL — ABNORMAL HIGH (ref 70–99)
Glucose-Capillary: 74 mg/dL (ref 70–99)
Glucose-Capillary: 169 mg/dL — ABNORMAL HIGH (ref 70–99)
Glucose-Capillary: 58 mg/dL — ABNORMAL LOW (ref 70–99)
Glucose-Capillary: 211 mg/dL — ABNORMAL HIGH (ref 70–99)
Glucose-Capillary: 176 mg/dL — ABNORMAL HIGH (ref 70–99)
Glucose-Capillary: 222 mg/dL — ABNORMAL HIGH (ref 70–99)

## 2021-09-10 LAB — MAGNESIUM: Magnesium: 2.8 mg/dL — ABNORMAL HIGH (ref 1.7–2.4)

## 2021-09-10 IMAGING — MR MR HEAD W/O CM
6 of 10 series · 28 of 48 positions shown · non-contrast
Comparison: Prior head CT examinations [DATE] and earlier.
Brain MRI [DATE].

CLINICAL DATA: Neuro deficit, acute, stroke suspected; question
subarachnoid hemorrhage on CT head, unresponsive, fixed dilated
pupils.

EXAM:
MRI HEAD WITHOUT CONTRAST
MRA HEAD WITHOUT CONTRAST
TECHNIQUE: Multiplanar, multi-echo pulse sequences of the brain and surrounding
structures were acquired without intravenous contrast. Angiographic
images of the Circle of Willis were acquired using MRA technique
without intravenous contrast.

[Series 3: DWI · axial · 3.0mm · 0.94mm/px · z∈[-131,+20]mm · 9 of 108 slices shown (1 of 2)]
[im 1/108]
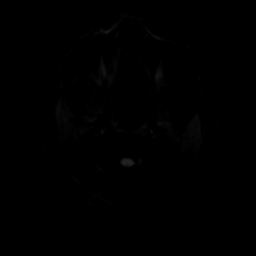
[im 14/108]
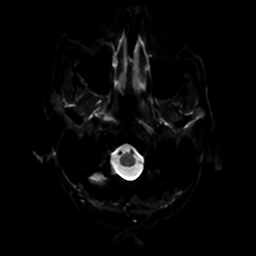
[im 27/108]
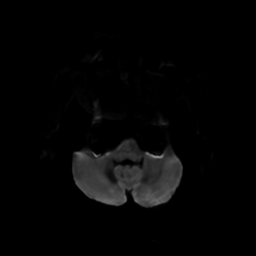
[im 41/108]
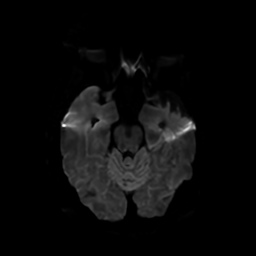
[im 54/108]
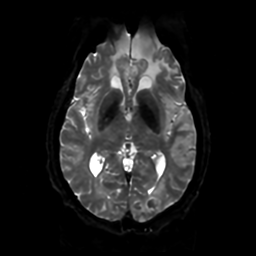
[im 67/108]
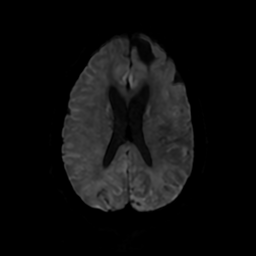
[im 81/108]
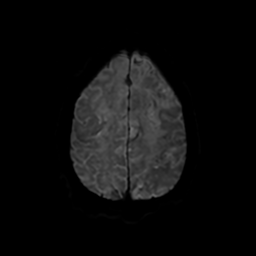
[im 94/108]
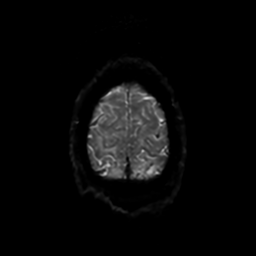
[im 108/108]
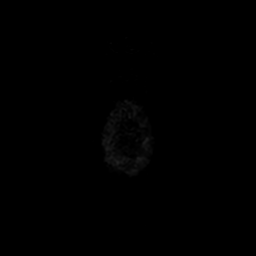

[Series 4: DWI · coronal · 4.0mm · 0.94mm/px · 7 of 78 slices shown (2 of 2)]
[im 1/78]
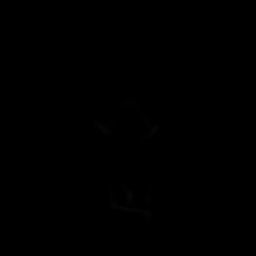
[im 13/78]
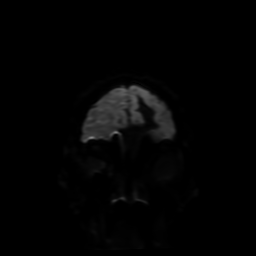
[im 26/78]
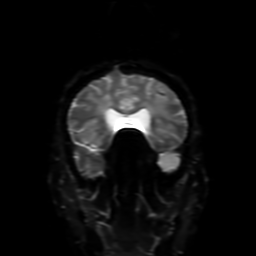
[im 39/78]
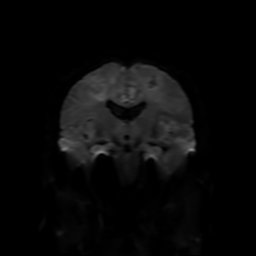
[im 52/78]
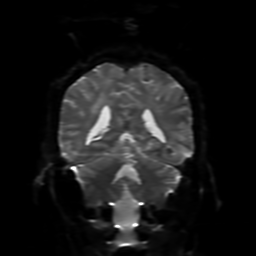
[im 65/78]
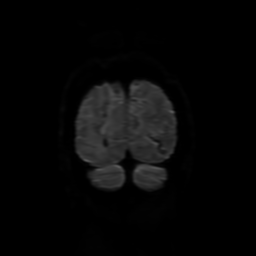
[im 78/78]
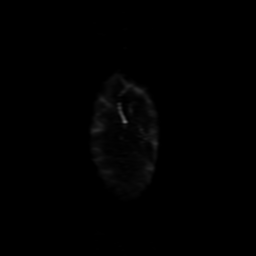

[Series 5: FLAIR · sagittal · 5.0mm · 0.23mm/px · 2 of 25 slices shown (1 of 2)]
[im 1/25]
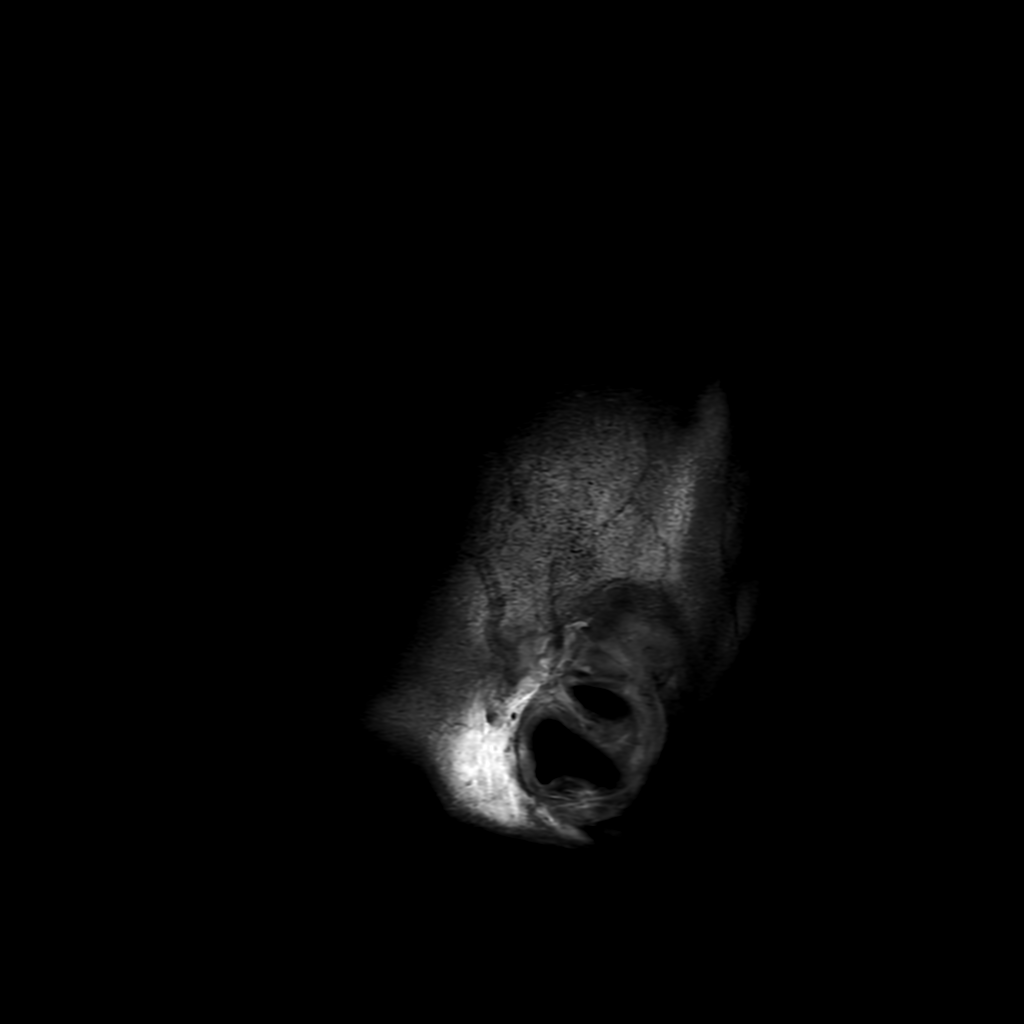
[im 25/25]
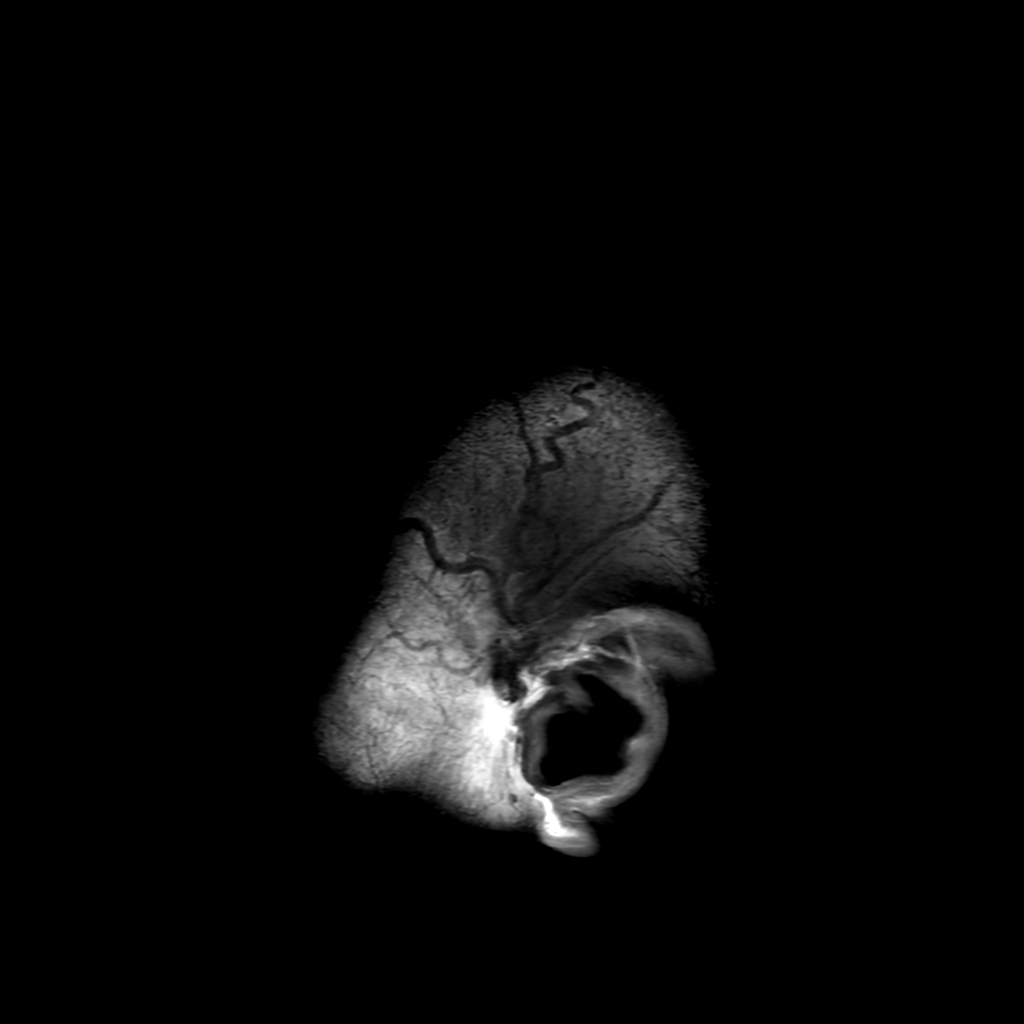

[Series 7: FLAIR · axial · 4.0mm · 0.45mm/px · z∈[-129,+22]mm · 3 of 37 slices shown (2 of 2)]
[im 1/37]
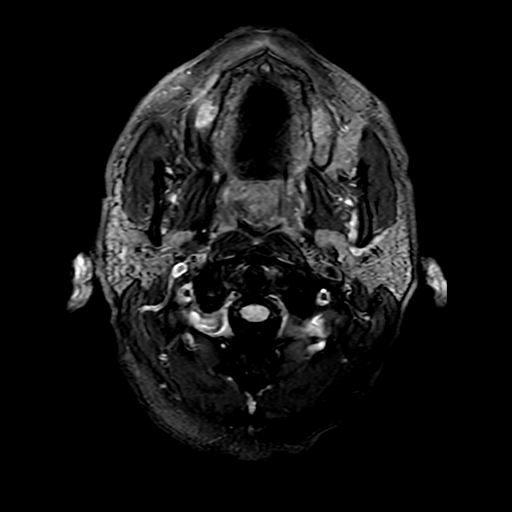
[im 19/37]
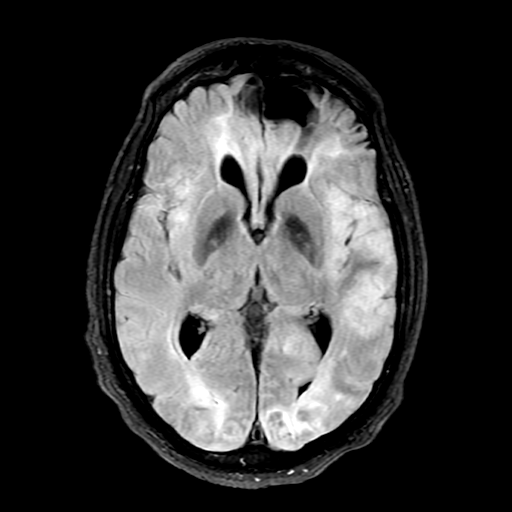
[im 37/37]
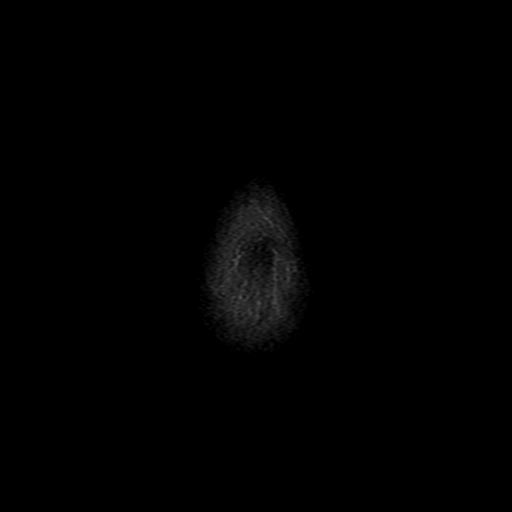

[Series 350: ADC · axial · 3.0mm · 0.94mm/px · z∈[-131,+20]mm · 5 of 54 slices shown (1 of 2)]
[im 1/54]
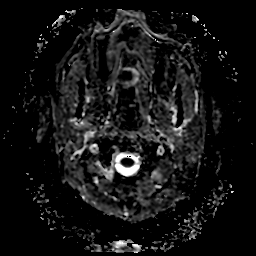
[im 14/54]
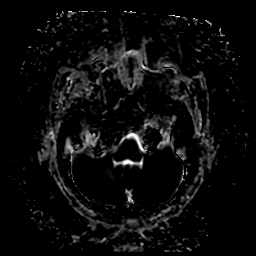
[im 27/54]
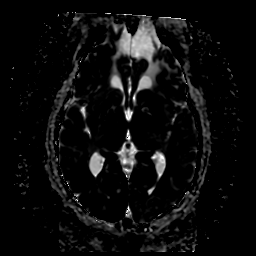
[im 40/54]
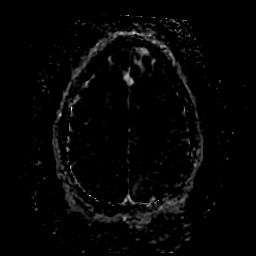
[im 54/54]
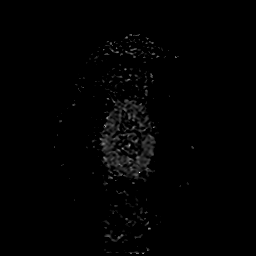

[Series 450: ADC · coronal · 4.0mm · 0.94mm/px · 2 of 39 slices shown (2 of 2)]
[im 1/39]
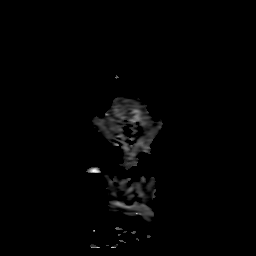
[im 20/39]
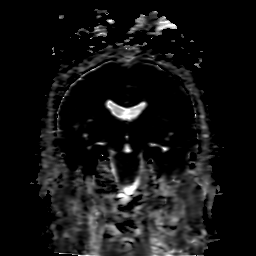

[28 of 48 positions shown; findings below may reference images not displayed]

FINDINGS: MRI HEAD FINDINGS

Brain:

Similar in extent as compared to the prior MRI of [DATE], there
is extensive patchy cortical/subcortical lesion weighted and T2
FLAIR signal abnormality within the bilateral cerebral hemispheres,
as well as small patchy foci of T2 FLAIR hyperintense signal
abnormality within the bilateral brainstem and cerebellar
hemispheres. However, new from this prior examination, there is
patchy gyriform SWI signal loss and T1 hyperintensity within the
bilateral cerebral hemispheres compatible with petechial hemorrhage.
These findings are favored to reflect sequela of posterior
reversible encephalopathy syndrome (PRES) with complicating infarcts
and hemorrhage. There is some cerebral sulcal effacement and
effacement of the basal cisterns, likely reflecting a component of
cerebral edema. However, there is no midline shift or evidence of
herniation at this time.

Redemonstrated chronic encephalomalacia/gliosis within the
anteroinferior frontal lobes and anterior left temporal lobe, likely
posttraumatic in etiology.

No extra-axial fluid collection.

No midline shift.

Vascular: Maintained flow voids within the proximal large arterial
vessels.

Skull and upper cervical spine: No focal suspicious marrow lesion.

Sinuses/Orbits: Visualized orbits show no acute finding. Trace
scattered paranasal sinus mucosal thickening at the imaged levels.

MRA HEAD FINDINGS

The examination is markedly limited by technique, with overall poor
intracranial flow-related signal. Within this limitations, no
definite proximal large vessel occlusion is identified.

These results were called by telephone at the time of interpretation
on [DATE] at [DATE] to provider Dr. RUDI, who verbally
acknowledged these results.
IMPRESSION: MRI brain:

1. Similar in extent as compared to the prior MRI of [DATE],
there is extensive patchy cortical/subcortical diffusion-weighted
and T2 FLAIR signal abnormality within the bilateral cerebral
hemispheres, as well as small patchy foci of T2 FLAIR signal
abnormality within the bilateral brainstem and cerebellar
hemispheres. However, new from the prior MRI, there is patchy
gyriform SWI signal loss and T1 hyperintensity within the bilateral
cerebral hemispheres compatible with petechial hemorrhage. These
findings are favored to reflect sequela of posterior reversible
encephalopathy syndrome (PRES) with complicating infarcts and
hemorrhage. There is some cerebral sulcal effacement and effacement
of the basal cisterns, likely reflecting a component of cerebral
edema. However, there is no midline shift or evidence of herniation
at this time.
2. Redemonstrated foci of chronic encephalomalacia/gliosis within
the anteroinferior frontal lobes and anterior left temporal lobe,
likely posttraumatic in etiology.

MRA head:

The examination is markedly limited by technique, with overall poor
intracranial flow-related signal. Within this limitations, no
definite proximal large vessel occlusion is identified.

## 2021-09-10 IMAGING — MR MR MRA HEAD W/O CM
3 series · 17 of 48 positions shown · non-contrast
Comparison: Prior head CT examinations [DATE] and earlier.
Brain MRI [DATE].

CLINICAL DATA: Neuro deficit, acute, stroke suspected; question
subarachnoid hemorrhage on CT head, unresponsive, fixed dilated
pupils.

EXAM:
MRI HEAD WITHOUT CONTRAST
MRA HEAD WITHOUT CONTRAST
TECHNIQUE: Multiplanar, multi-echo pulse sequences of the brain and surrounding
structures were acquired without intravenous contrast. Angiographic
images of the Circle of Willis were acquired using MRA technique
without intravenous contrast.

[Series 2: ax (id) · axial · 1.0mm · 0.43mm/px · z∈[-112,-33]mm · 15 of 176 slices shown]
[im 1/176]
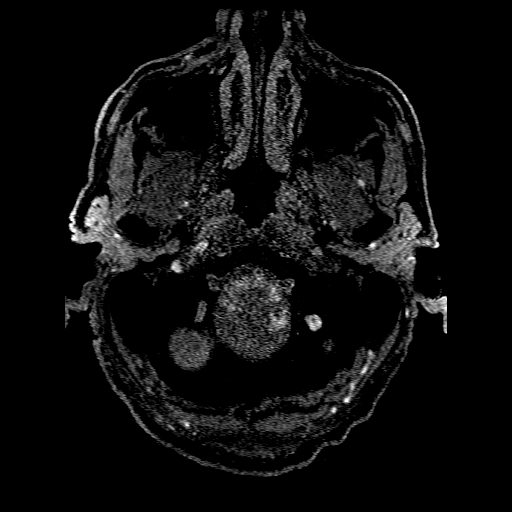
[im 4/176]
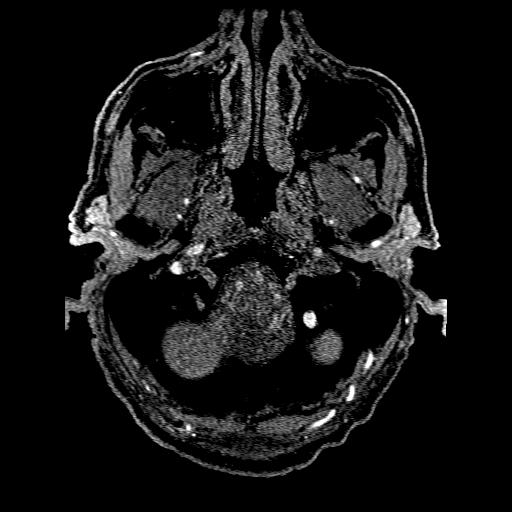
[im 8/176]
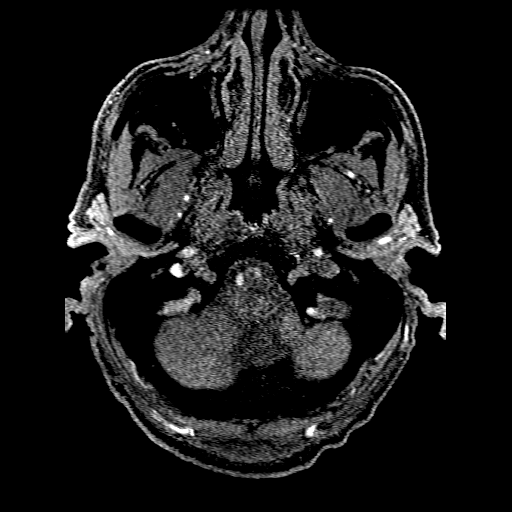
[im 12/176]
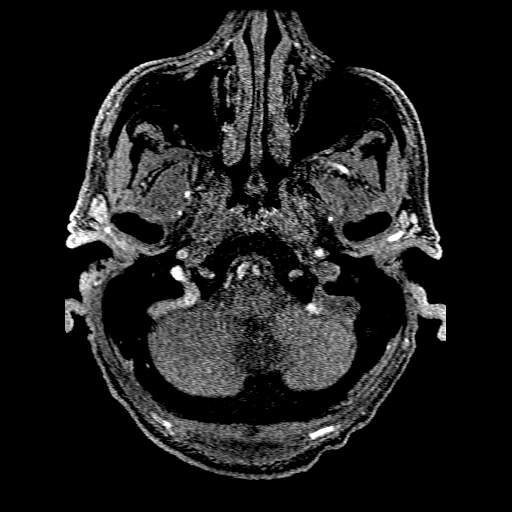
[im 16/176]
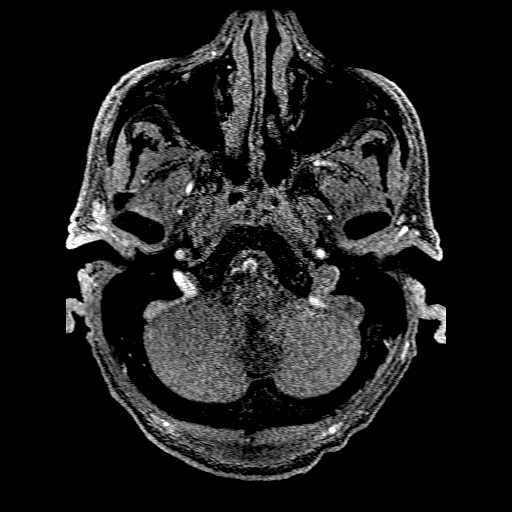
[im 28/176]
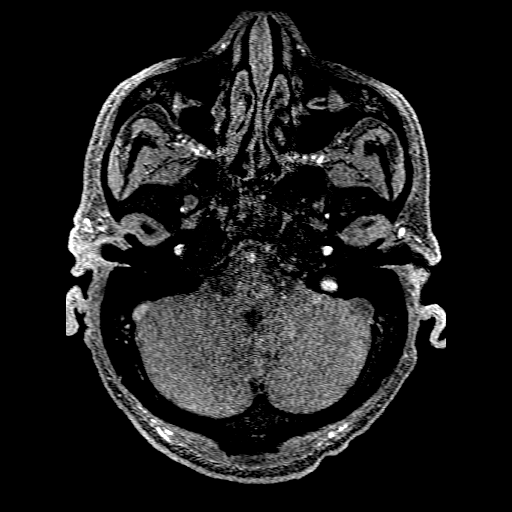
[im 32/176]
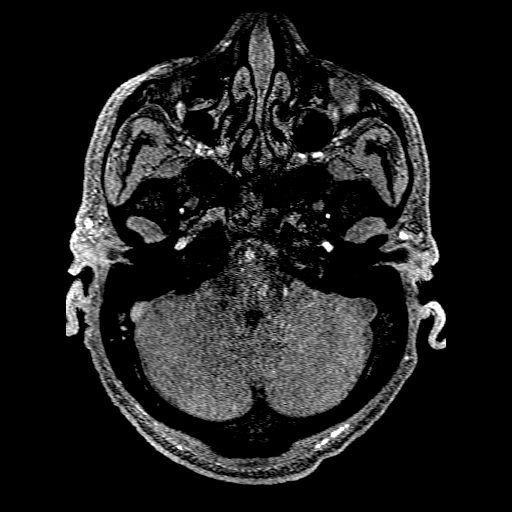
[im 55/176]
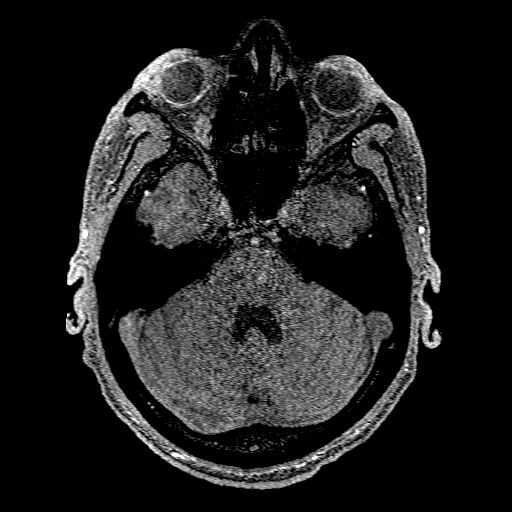
[im 78/176]
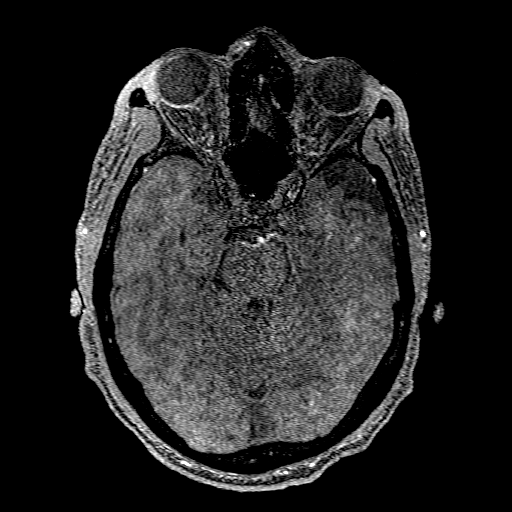
[im 90/176]
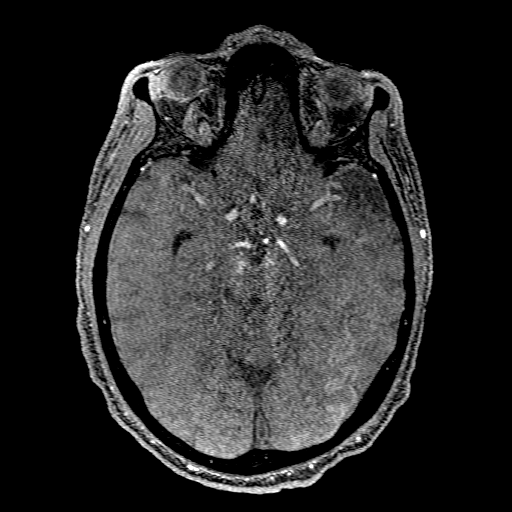
[im 98/176]
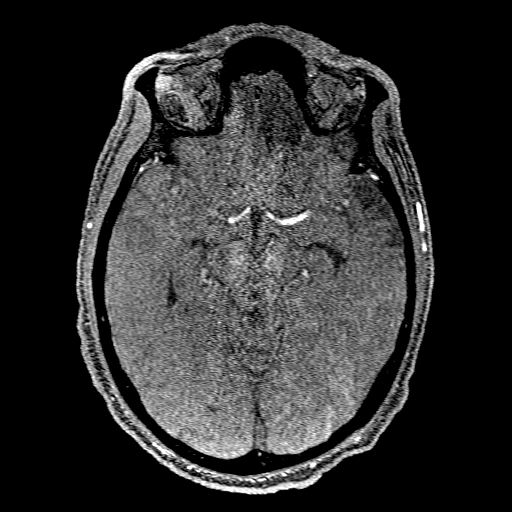
[im 121/176]
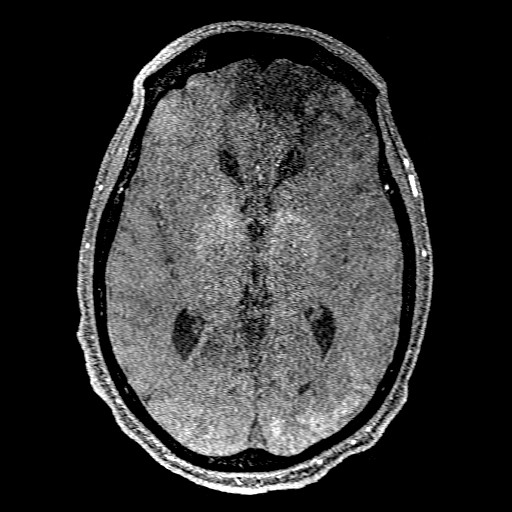
[im 144/176]
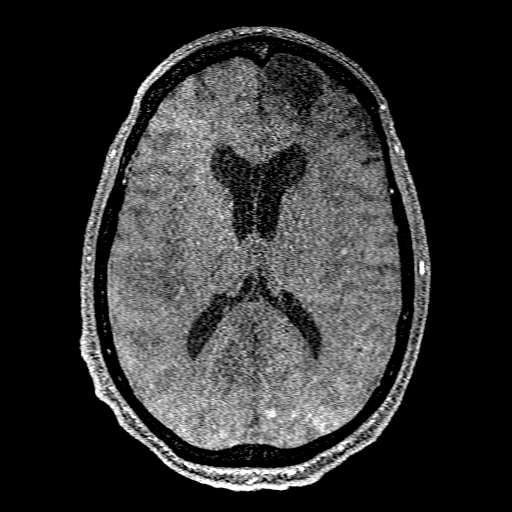
[im 148/176]
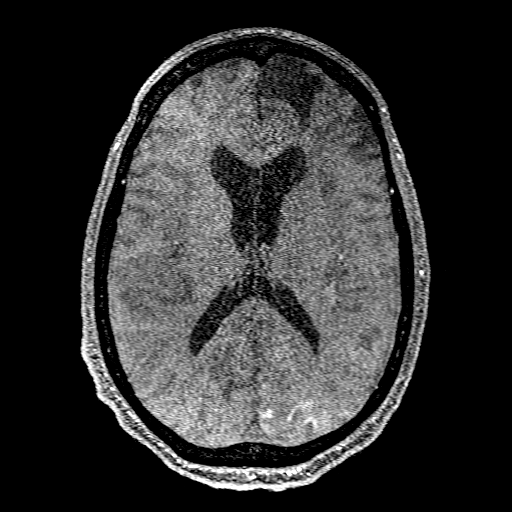
[im 168/176]
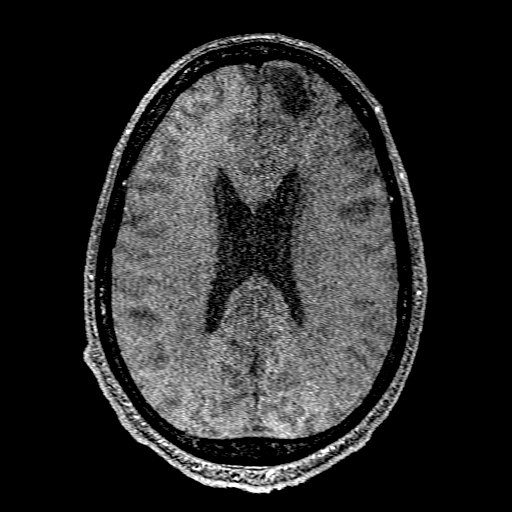

[Series 200: col:ax (id) · axial · 1.0mm · 0.43mm/px · 1 of 1 slices shown]
[im 1/1]
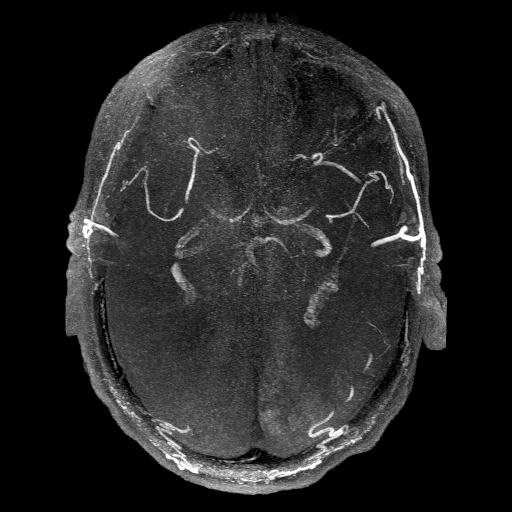

[Series 201: pjn:ax (id) · sagittal · 1.0mm · 0.43mm/px · 1 of 3 slices shown]
[im 1/3]
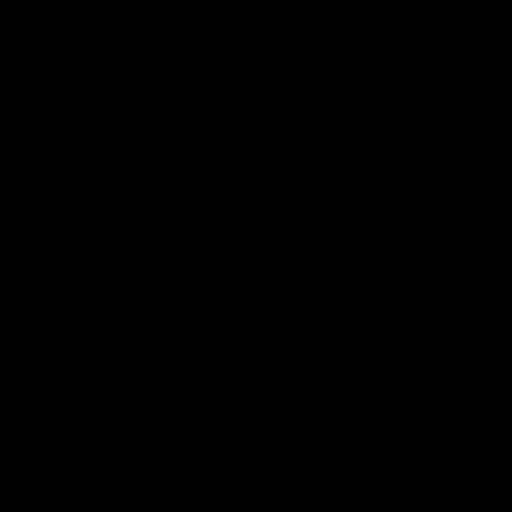

[17 of 48 positions shown; findings below may reference images not displayed]

FINDINGS: MRI HEAD FINDINGS

Brain:

Similar in extent as compared to the prior MRI of [DATE], there
is extensive patchy cortical/subcortical lesion weighted and T2
FLAIR signal abnormality within the bilateral cerebral hemispheres,
as well as small patchy foci of T2 FLAIR hyperintense signal
abnormality within the bilateral brainstem and cerebellar
hemispheres. However, new from this prior examination, there is
patchy gyriform SWI signal loss and T1 hyperintensity within the
bilateral cerebral hemispheres compatible with petechial hemorrhage.
These findings are favored to reflect sequela of posterior
reversible encephalopathy syndrome (PRES) with complicating infarcts
and hemorrhage. There is some cerebral sulcal effacement and
effacement of the basal cisterns, likely reflecting a component of
cerebral edema. However, there is no midline shift or evidence of
herniation at this time.

Redemonstrated chronic encephalomalacia/gliosis within the
anteroinferior frontal lobes and anterior left temporal lobe, likely
posttraumatic in etiology.

No extra-axial fluid collection.

No midline shift.

Vascular: Maintained flow voids within the proximal large arterial
vessels.

Skull and upper cervical spine: No focal suspicious marrow lesion.

Sinuses/Orbits: Visualized orbits show no acute finding. Trace
scattered paranasal sinus mucosal thickening at the imaged levels.

MRA HEAD FINDINGS

The examination is markedly limited by technique, with overall poor
intracranial flow-related signal. Within this limitations, no
definite proximal large vessel occlusion is identified.

These results were called by telephone at the time of interpretation
on [DATE] at [DATE] to provider Dr. RUDI, who verbally
acknowledged these results.
IMPRESSION: MRI brain:

1. Similar in extent as compared to the prior MRI of [DATE],
there is extensive patchy cortical/subcortical diffusion-weighted
and T2 FLAIR signal abnormality within the bilateral cerebral
hemispheres, as well as small patchy foci of T2 FLAIR signal
abnormality within the bilateral brainstem and cerebellar
hemispheres. However, new from the prior MRI, there is patchy
gyriform SWI signal loss and T1 hyperintensity within the bilateral
cerebral hemispheres compatible with petechial hemorrhage. These
findings are favored to reflect sequela of posterior reversible
encephalopathy syndrome (PRES) with complicating infarcts and
hemorrhage. There is some cerebral sulcal effacement and effacement
of the basal cisterns, likely reflecting a component of cerebral
edema. However, there is no midline shift or evidence of herniation
at this time.
2. Redemonstrated foci of chronic encephalomalacia/gliosis within
the anteroinferior frontal lobes and anterior left temporal lobe,
likely posttraumatic in etiology.

MRA head:

The examination is markedly limited by technique, with overall poor
intracranial flow-related signal. Within this limitations, no
definite proximal large vessel occlusion is identified.

## 2021-09-10 IMAGING — CT CT HEAD W/O CM
3 of 4 series · 12 of 47 positions shown, 14 images · non-contrast
Comparison: Brain MRI [DATE]. CT angiogram head/neck
[DATE]. Noncontrast head CT [DATE].
COMPARISON: Brain MRI [DATE]. CT angiogram head/neck
[DATE]. Noncontrast head CT [DATE].

Addendum:
CLINICAL DATA: Seizure, abnormal neuro exam, sudden change in
mental status.

EXAM:
CT HEAD WITHOUT CONTRAST
TECHNIQUE: Contiguous axial images were obtained from the base of the skull
through the vertex without intravenous contrast.

[Series 3: head without · axial · non-contrast · 0.42mm/px · z∈[-184,-64]mm · 6 of 34 slices shown, 8 images]
[im 5/34  brain]
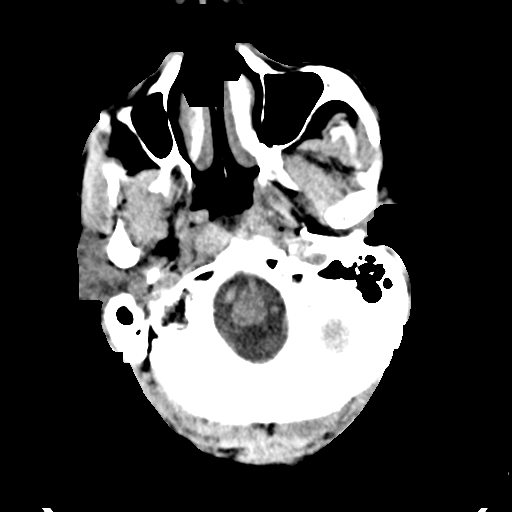
[im 5/34  bone]
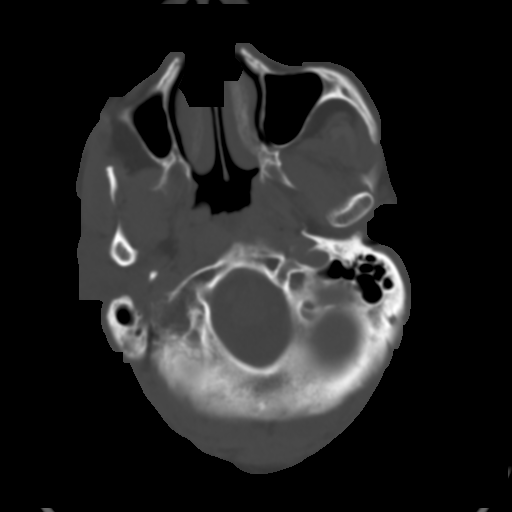
[im 10/34  brain]
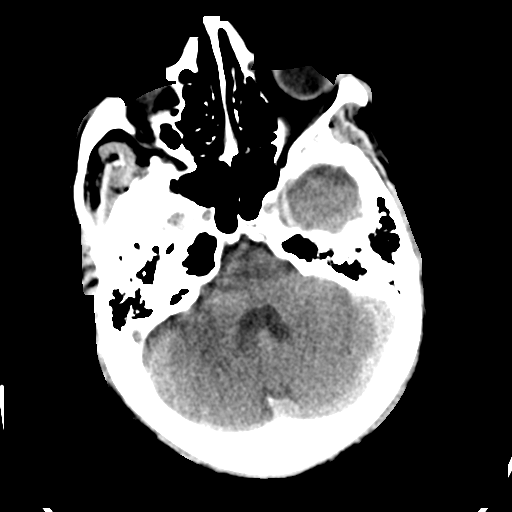
[im 15/34  brain]
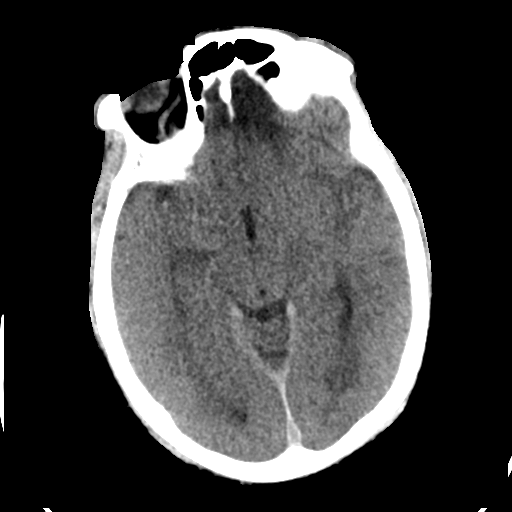
[im 19/34  brain]
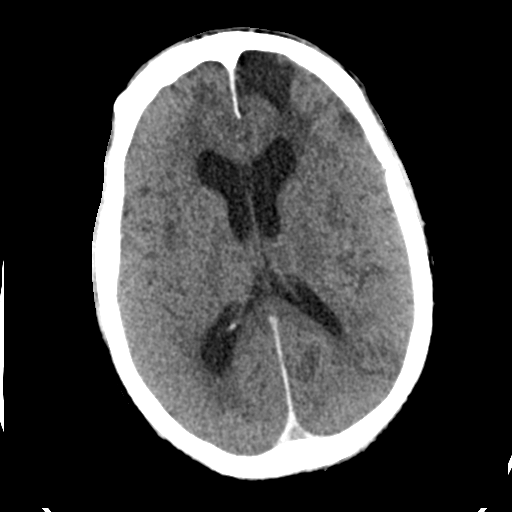
[im 24/34  brain]
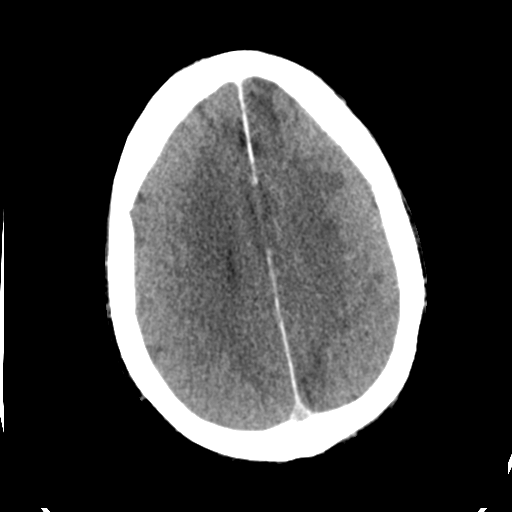
[im 24/34  bone]
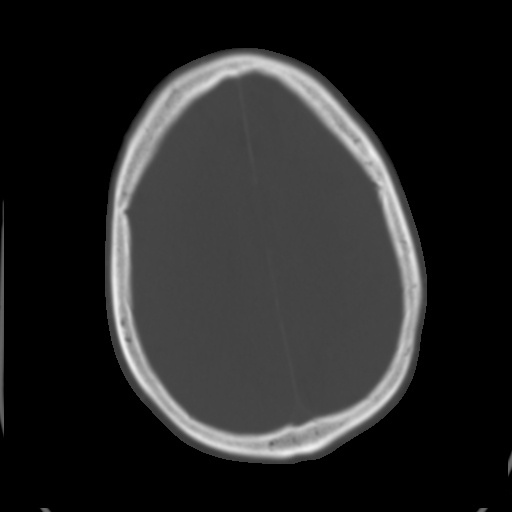
[im 29/34  brain]
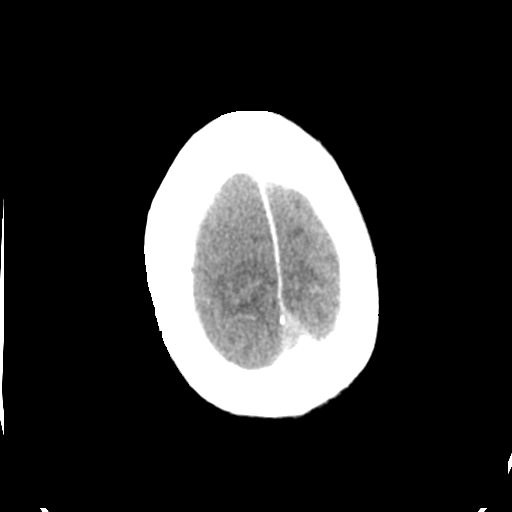

[Series 5: head without cor · coronal · non-contrast · 0.29mm/px · 3 of 73 slices shown]
[im 25/73  brain]
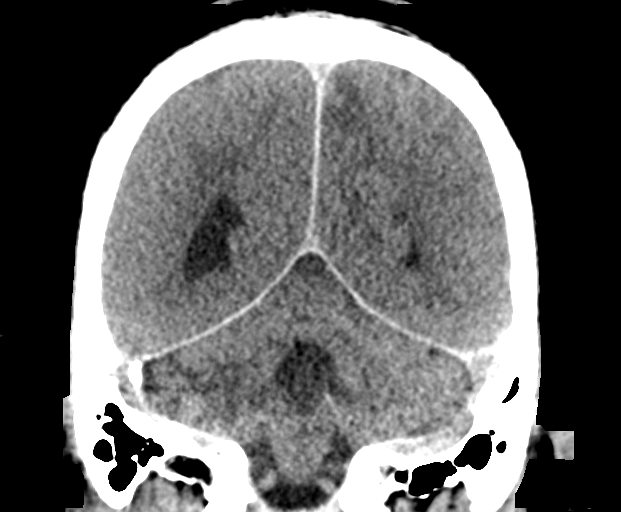
[im 33/73  brain]
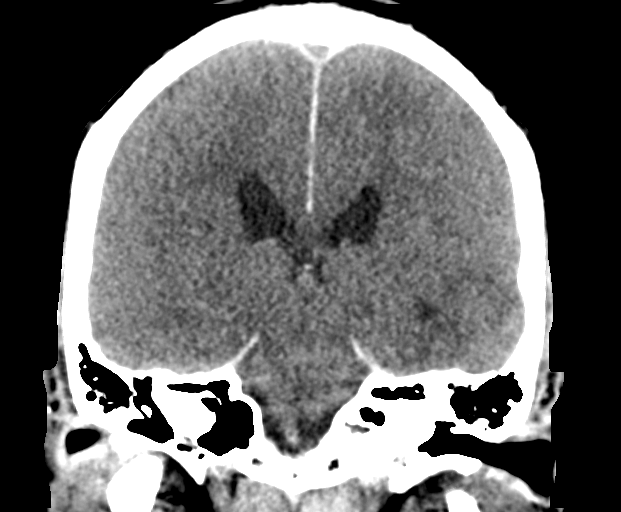
[im 41/73  brain]
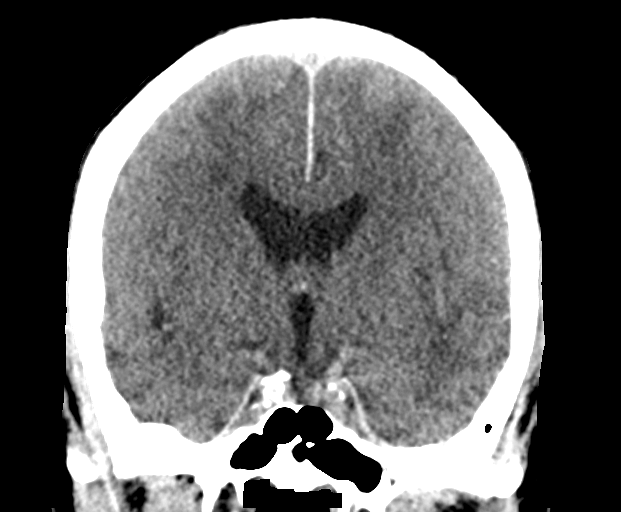

[Series 6: head without sag · sagittal · non-contrast · 0.29mm/px · 3 of 65 slices shown]
[im 22/65  brain]
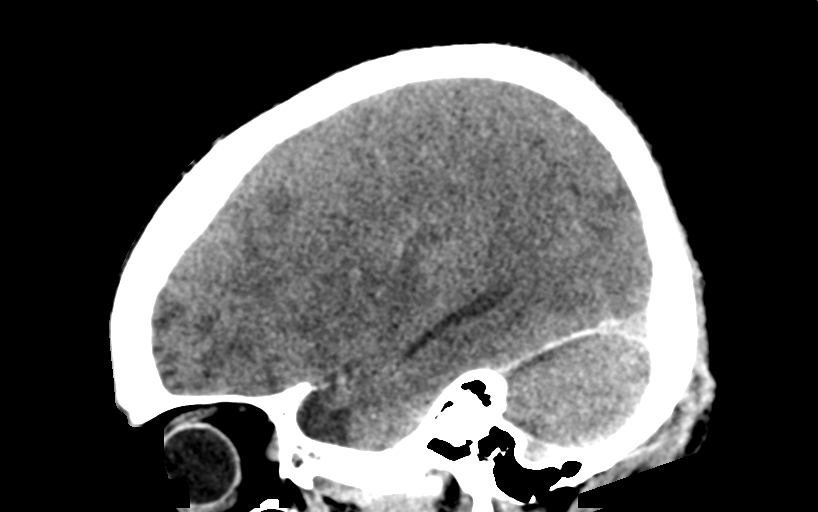
[im 33/65  brain]
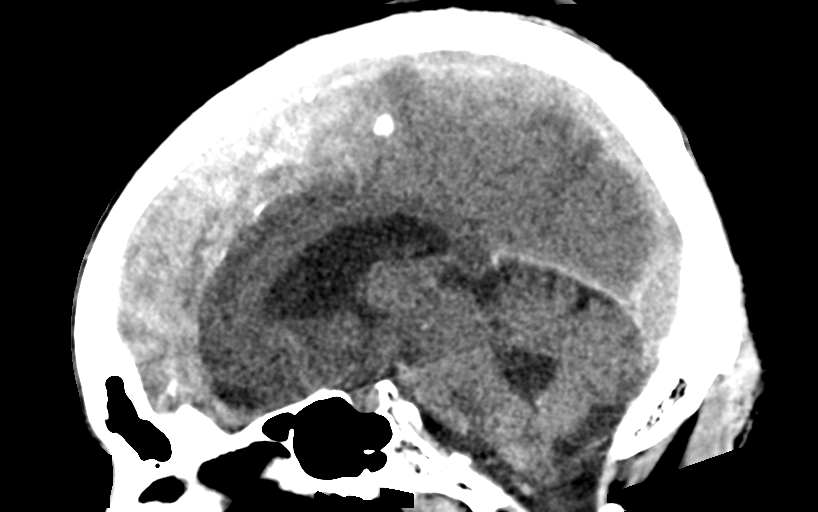
[im 43/65  brain]
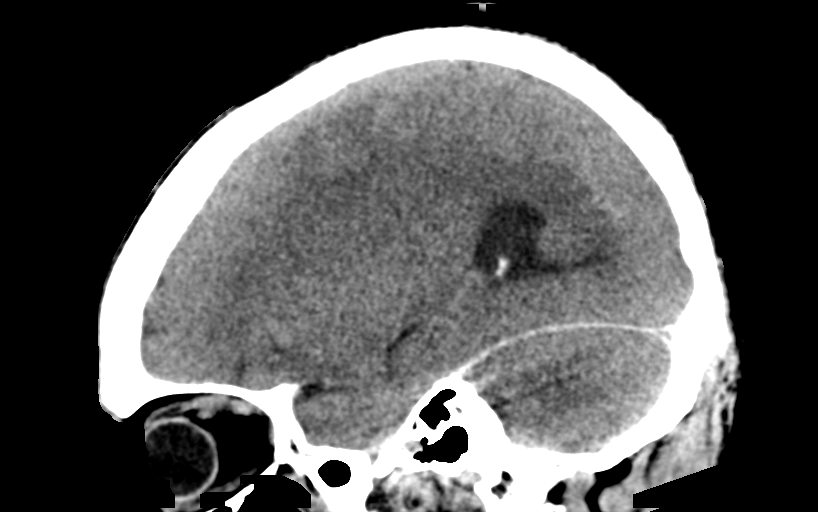

[12 of 47 positions shown; findings below may reference images not displayed]

FINDINGS: Brain:

Redemonstrated chronic encephalomalacia/gliosis within the
anteroinferior frontal lobes and anterior left temporal lobe, likely
posttraumatic in etiology.

There is patchy cortical/subcortical edema throughout the bilateral
cerebral hemispheres, better appreciated on the prior brain MRI of
[DATE]. No significant mass effect at this time.

New from prior examinations, there is subtle curvilinear
hyperdensity along the posterior right temporal lobe and lateral
right occipital lobe, which may reflect vessels. However, trace
acute subarachnoid hemorrhage cannot be excluded.

No evidence of an intracranial mass.

No midline shift.

Vascular: Atherosclerotic calcifications.

Skull: Normal. Negative for fracture or focal lesion.

Sinuses/Orbits: Visualized orbits show no acute finding. Trace
scattered paranasal sinus mucosal thickening.

Attempts are being made to reach the ordering provider at this time.
IMPRESSION: Patchy cortical/subcortical edema throughout the bilateral cerebral
hemispheres, better appreciated on the prior brain MRI of
[DATE]. Primary differential considerations are unchanged and
include sequela of posterior reversible encephalopathy syndrome
(PRES), seizure related edema and/or acute/early subacute infarcts.
No significant mass effect at this time.

New from prior examinations, there is subtle curvilinear
hyperdensity along the posterior right temporal lobe and lateral
right occipital lobe, which may reflect vessels. However, trace
acute subarachnoid hemorrhage cannot be excluded. Consider
short-interval noncontrast head CT follow-up.

Redemonstrated foci of chronic encephalomalacia/gliosis within the
anteroinferior frontal lobes and anterior left temporal lobe, likely
posttraumatic in etiology.

ADDENDUM:
Impression #2 called by telephone at the time of interpretation on
[DATE] at [DATE] to provider HECKBURN , who verbally
acknowledged these results.

*** End of Addendum ***
FINDINGS: Brain:

Redemonstrated chronic encephalomalacia/gliosis within the
anteroinferior frontal lobes and anterior left temporal lobe, likely
posttraumatic in etiology.

There is patchy cortical/subcortical edema throughout the bilateral
cerebral hemispheres, better appreciated on the prior brain MRI of
[DATE]. No significant mass effect at this time.

New from prior examinations, there is subtle curvilinear
hyperdensity along the posterior right temporal lobe and lateral
right occipital lobe, which may reflect vessels. However, trace
acute subarachnoid hemorrhage cannot be excluded.

No evidence of an intracranial mass.

No midline shift.

Vascular: Atherosclerotic calcifications.

Skull: Normal. Negative for fracture or focal lesion.

Sinuses/Orbits: Visualized orbits show no acute finding. Trace
scattered paranasal sinus mucosal thickening.

Attempts are being made to reach the ordering provider at this time.
IMPRESSION: Patchy cortical/subcortical edema throughout the bilateral cerebral
hemispheres, better appreciated on the prior brain MRI of
[DATE]. Primary differential considerations are unchanged and
include sequela of posterior reversible encephalopathy syndrome
(PRES), seizure related edema and/or acute/early subacute infarcts.
No significant mass effect at this time.

New from prior examinations, there is subtle curvilinear
hyperdensity along the posterior right temporal lobe and lateral
right occipital lobe, which may reflect vessels. However, trace
acute subarachnoid hemorrhage cannot be excluded. Consider
short-interval noncontrast head CT follow-up.

Redemonstrated foci of chronic encephalomalacia/gliosis within the
anteroinferior frontal lobes and anterior left temporal lobe, likely
posttraumatic in etiology.

## 2021-09-10 MED ORDER — DEXTROSE 50 % IV SOLN
1.0000 | Freq: Once | INTRAVENOUS | Status: AC
  Administered 2021-09-10: 50 mL via INTRAVENOUS
  Filled 2021-09-10: qty 50

## 2021-09-10 MED ORDER — ALBUTEROL SULFATE (2.5 MG/3ML) 0.083% IN NEBU
10.0000 mg | INHALATION_SOLUTION | Freq: Once | RESPIRATORY_TRACT | Status: AC
  Filled 2021-09-10: qty 12

## 2021-09-10 MED ORDER — ALBUTEROL SULFATE (2.5 MG/3ML) 0.083% IN NEBU
INHALATION_SOLUTION | RESPIRATORY_TRACT | Status: AC
  Administered 2021-09-10: 10 mg via RESPIRATORY_TRACT
  Filled 2021-09-10: qty 12

## 2021-09-10 MED ORDER — SODIUM ZIRCONIUM CYCLOSILICATE 10 G PO PACK
10.0000 g | PACK | Freq: Once | ORAL | Status: AC
  Administered 2021-09-10: 10 g
  Filled 2021-09-10: qty 1

## 2021-09-10 MED ORDER — INSULIN DETEMIR 100 UNIT/ML ~~LOC~~ SOLN
12.0000 [IU] | Freq: Two times a day (BID) | SUBCUTANEOUS | Status: DC
  Administered 2021-09-10 – 2021-09-11 (×3): 12 [IU] via SUBCUTANEOUS
  Filled 2021-09-10 (×4): qty 0.12

## 2021-09-10 MED ORDER — CALCIUM GLUCONATE 10 % IV SOLN
1.0000 g | Freq: Once | INTRAVENOUS | Status: AC
  Administered 2021-09-10: 1 g via INTRAVENOUS
  Filled 2021-09-10: qty 10

## 2021-09-10 MED ORDER — INSULIN ASPART 100 UNIT/ML IV SOLN
5.0000 [IU] | Freq: Once | INTRAVENOUS | Status: AC
  Administered 2021-09-10: 5 [IU] via INTRAVENOUS

## 2021-09-10 NOTE — Procedures (Signed)
Patient Name: Jonathan Kane  MRN: [id number]  Epilepsy Attending: Lora Havens  Referring Physician/Provider: Dr Su Monks Date: 09/10/2021  Duration: 21.56 mins  Patient history: 75yo M who initially presented with status epilepticus which resolved, now with sudden worsening of mental status. EEG to evaluate for seizure.  Level of alertness:  comatose  AEDs during EEG study: PHT, LEV  Technical aspects: This EEG study was done with scalp electrodes positioned according to the 10-20 International system of electrode placement. Electrical activity was acquired at a sampling rate of 500Hz  and reviewed with a high frequency filter of 70Hz  and a low frequency filter of 1Hz . EEG data were recorded continuously and digitally stored.   Description: EEG showed continuous generalized background suppression. Hyperventilation and photic stimulation were not performed.     ABNORMALITY - Background suppression, generalized  IMPRESSION: This study is suggestive of profound diffuse encephalopathy, nonspecific etiology. No seizures or epileptiform discharges were seen throughout the recording.  EEG appears to be worse compared to previous study. Dr Lorrin Goodell was notified.    Jonathan Kane

## 2021-09-10 NOTE — Significant Event (Signed)
Neurology significant event note  Called by PCCM to evaluate patient at bedside for change in neurologic exam. On Dr. Karolee Stamps examination this AM he did not open eyes or follow commands but did have reactive pupils, intact brainstem reflexes, and withdrew to noxious stimuli in all extremities.  On re-examination this afternoon, pupils 5-76mm and minimally reactive, (-) corneals, oculocephalics, cough, gag. No movement to noxious stimuli in any extremity.  Head CT wo contrast @ 1620  Patchy cortical/subcortical edema throughout the bilateral cerebral hemispheres, better appreciated on the prior brain MRI of 09/03/2021. Primary differential considerations are unchanged and include sequela of posterior reversible encephalopathy syndrome (PRES), seizure related edema and/or acute/early subacute infarcts. No significant mass effect at this time.   New from prior examinations, there is subtle curvilinear hyperdensity along the posterior right temporal lobe and lateral right occipital lobe, which may reflect vessels. However, trace acute subarachnoid hemorrhage cannot be excluded. Consider short-interval noncontrast head CT follow-up.   Redemonstrated foci of chronic encephalomalacia/gliosis within the anteroinferior frontal lobes and anterior left temporal lobe, likely posttraumatic in etiology.  Brain imaging personally reviewed; I agree with above interpretation  Given no e/o mass effect or herniation on head CT, etiology of his severe decline in exam is unclear.   Recommendations: - STAT MRI brain wo contrast r/o acute ischemia and evaluate hyperintensities seen on head CT (vessels vs trace acute SAH) - STAT MRA head wo contrast - After imaging completed, STAT EEG with rapid interpretation to be f/b cEEG overnight (regardless of findings on STAT) - D/w Dr. Hortense Ramal and Dr. Kara Pacer, MD [facility] [phone]  If [place], please page neurology on call as  listed in [place].

## 2021-09-10 NOTE — Progress Notes (Signed)
[place] Progress Note Patient Name: SEAN MALINOWSKI DOB: 02-Jan-1946 MRN: [id number]   Date of Service  09/10/2021  HPI/Events of Note  K is 7.3, it was 6.3 yesterday and he was given Lokelma. Is due to have HD today .   eICU Interventions  K too high to avoid shifting therapy at this time Albuterol, insulin, D50, calcium gluconate, CBG checks and follow up K ordered      Intervention Category Major Interventions: Electrolyte abnormality - evaluation and management  Margaretmary Lombard 09/10/2021, 3:48 AM

## 2021-09-10 NOTE — Progress Notes (Incomplete)
Hypoglycemic Event  CBG: 58 +  Treatment: {Hypoglycemic Treatment (must also place order and document administration):3049002}  Symptoms: None  Follow-up CBG: Time:*** CBG Result:***  Possible Reasons for Event: {Possible Reasons for RCVKF:8403754}  Comments/MD notified:***    [place]ey [place]

## 2021-09-10 NOTE — Progress Notes (Signed)
Pt transported to MRI 1 from 3M05 on the ventilator. RT, RN and transport accompanied pt.

## 2021-09-10 NOTE — Progress Notes (Addendum)
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   Slight hypothermia  Discussed with neurology at bedside (Dr. Quinn Axe). It is not clear why his exam looks  Will obtain MRI and MRA brain and then EEG to look for ischemia, better understand the [facility] finding.  I updated his daughter Jonathan Kane.  Prognosis guarded.  Additional cc time 27 minutes  Roselie Awkward, MD [place] [facility] Pager: [phone] Cell: [phone] After 7:00 pm call Elink  [phone]

## 2021-09-10 NOTE — Progress Notes (Signed)
Subjective: No acute events overnight.  Continues to be comatose.  ROS: Unable to obtain due to poor mental status  Examination  Vital signs in last 24 hours: Temp:  [98 F (36.7 C)-98.4 F (36.9 C)] 98 F (36.7 C) (11/25 0946) Pulse Rate:  [65-94] 83 (11/25 0700) Resp:  [13-26] 15 (11/25 0700) BP: (122-204)/(62-77) 152/65 (11/25 0700) SpO2:  [90 %-100 %] 97 % (11/25 0700) FiO2 (%):  [30 %] 30 % (11/25 0416) Weight:  [69.7 kg-72 kg] 72 kg (11/25 0932)  General: lying in bed, NAD CVS: pulse-normal rate and rhythm RS: Intubated, coarse breath sounds bilaterally Extremities: Warm, no edema Neuro: Does not open eyes to noxious stimuli, does not follow commands, PERRLA, corneal reflex intact, gag reflex intact, withdraws to noxious stimuli in all 4 extremities   Basic Metabolic Panel: Recent Labs  Lab 09/03/21 1633 09/03/21 1633 09/04/21 0709 09/04/21 1627 09/05/21 0329 09/06/21 0057 09/07/21 0218 09/08/21 0236 09/09/21 0238 09/09/21 0748 09/10/21 0221 09/10/21 0556  NA  --    < > 133*  --  131*   < > 132* 132* 133*  --  131* 129*  K  --    < > 5.0  --  4.4   < > 5.1 5.1 5.8* 6.3* 7.3* 6.5*  CL  --    < > 95*  --  95*   < > 96* 96* 95*  --  93* 91*  CO2  --    < > 21*  --  22   < > 23 22 23   --  21* 20*  GLUCOSE  --    < > 196*  --  331*   < > 215* 203* 75  --  248* 299*  BUN  --    < > 50*  --  32*   < > 41* 27* 46*  --  67* 70*  CREATININE  --    < > 9.36*  --  6.59*   < > 6.53* 4.50* 6.42*  --  8.25* 8.42*  CALCIUM  --    < > 8.3*  --  8.1*   < > 8.1* 8.3* 8.2*  --  8.2* 8.3*  MG 2.4  --  2.5* 2.1 2.2  --  2.6* 2.3 2.5*  --  2.8*  --   PHOS 5.5*  --  4.9* 4.4 2.4*  --  6.3*  --   --   --   --   --    < > = values in this interval not displayed.    CBC: Recent Labs  Lab 09/07/21 0218 09/08/21 0236 09/08/21 1815 09/09/21 0238 09/10/21 0556  WBC 10.3 7.0 10.0 9.2 8.8  HGB 10.2* 10.2* 10.0* 10.8* 10.1*  HCT 31.8* 30.9* 31.0* 33.0* 30.3*  MCV 96.4 96.6 95.7  94.8 95.0  PLT 181 85* 209 202 203     Coagulation Studies: No results for input(s): LABPROT, INR in the last 72 hours.  Imaging No new brain imaging overnight   ASSESSMENT AND PLAN: 75 yo M presenting with status epielpticus initially controlled on propofol. He has had some degree of PLEDs and recurrent seizures, but EEG has been improving. Unfortunately, he has extensive diffusion change on MRI.  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  Either way, given the degree of diffusion restriction, I am concerned that some  of this likely represents permanent injury.   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  Additionally patient had probable HHS as well as PRES which could have provoked his seizures -He continues to be encephalopathic even after being off sedation for days. Likely multifactorial due to prolonged post-ictal state, PRES, uremia   Recommendations -Continue oxcarbazepine 150 mg twice daily, Keppra 750 mg twice daily ( renally dosed)  -Continue modafinil 100 mg daily to promote wakefulness.  Of note this can sometimes worsen seizures therefore will keep a close eye on that -Called and updated daughter on 09/10/2021.  She again states patient would not have wanted tracheostomy and feeding tube.  She said her brother is coming on Monday.  She would like to see her father along with her brother and then potentially consider transitioning to comfort care -Management of rest of comorbidities per primary team   I have spent a total of  40 minutes with the patient reviewing hospital notes,  test results, labs and examining the patient as well as establishing an assessment and plan.  > 50% of time was  spent in direct patient care.    Zeb Comfort Epilepsy [facility] For questions after 5pm please refer to AMION to reach the Neurologist on call

## 2021-09-10 NOTE — Progress Notes (Signed)
Date and time results received: 09/10/21 0635 (use smartphrase ".now" to insert current time)  Test: K Critical Value: 6.5  Name of Provider Notified: Jeannene Patella, MD  Orders Received? Or Actions Taken?: Awaiting orders

## 2021-09-10 NOTE — Progress Notes (Signed)
Pt transported on the ventilator from 3M05 to CT2 and back without complication. RT and RN x2 accompanied pt. VSS throughout. RT will continue to monitor and be available.

## 2021-09-10 NOTE — Progress Notes (Signed)
[facility] Progress Note  Subjective:  clevidipine still on board.  unfortunately no change in MS-  K is very high this AM- to be given lokelma but trying to expedite dialysis   Vitals:   09/10/21 0630 09/10/21 0645 09/10/21 0700 09/10/21 0733  BP: (!) 154/64 (!) 149/69 (!) 152/65   Pulse: 81 82 83   Resp: _0 Temp:    98 F (36.7 C)  TempSrc:    Axillary  SpO2: 97% 97% 97%   Weight:      Height:        Exam: on vent , only responsive to noxious stimuli  no jvd  throat ett in place  Chest cta bilat and lat  + tdc  Cor reg no RG  Abd soft ntnd no ascites   Ext no LE edema   Neuro on vent and sedated, not following commands   RIJ TDC/ LUA AVF +bruit   Home meds include - asa, lipitor, diltiazem 180, hydralazine 25 tid, norco prn, keppra 1575m bid + 5030mpost HD mwf, dilantin 75 tid, renvela 3 ac tid, sod bicarb 650 bid, tradjenta 5 qd, tresiba 22u qd, prns/ vits/ supps   67.6kg    OP HD: MWF [facility]   3h 4576m 400/1.5   2/2 bath  65.5kg  Hep 2000  RIJ TDC / LUA BC AVF placed 08/25/21 (Dr ClaCarlis Abbott - venofer 100 x 10  - mircera 60 q2, last 11/2       Assessment/ Plan: Status epilepticus - per CCM/ neuro, intubated. EEG monitoring. AED's per neuro. Seizures improved, but MRI w/ sig changes that likely represent permanent injury, per neurology.   VDRF - per CCM-  possible extubation today  HTN emergency - BP's high on admit. NG hydralazine , amlodipine and labetalol and now clonidine- had been weaned off of IV clevidipine-  now using PRN and needing titrating up. No obvious vol excess,  -  challenging volume with HD has helped BP at least during treatments Volume - no obvious vol excess,  prob losing wt. UF goal 2-3 L per HD  ESRD - on HD MWF. HD today per holiday schedule. Sun-Tues-Fri this week. Next today to get back on schedule- needs to be done as separate DM uncont - sp IV insulin gtt, now is on SQ insulin.   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-  no binders Dispo-  daughter saying that patient would not want trach and PEG-  so if no strides by end of week will re address goals of care-  would not be a candidate for OP dialysis in this state     [place]11/25/2022, 8:57 AM   Recent Labs  Lab 09/05/21 0329 09/06/21 0057 09/07/21 0218 09/08/21 0236 09/09/21 0238 09/09/21 0748 09/10/21 0221 09/10/21 0556  K 4.4   < > 5.1   < > 5.8*   < > 7.3* 6.5*  BUN 32*   < > 41*   < > 46*  --  67* 70*  CREATININE 6.59*   < > 6.53*   < > 6.42*  --  8.25* 8.42*  CALCIUM 8.1*   < > 8.1*   < > 8.2*  --  8.2* 8.3*  PHOS 2.4*  --  6.3*  --   --   --   --   --   HGB  11.2*   < > 10.2*   < > 10.8*  --   --  10.1*   < > = values in this interval not displayed.   Inpatient medications:  amLODipine  10 mg Per Tube Daily   aspirin  81 mg Per Tube Daily   atorvastatin  40 mg Per Tube Daily   chlorhexidine gluconate (MEDLINE KIT)  15 mL Mouth Rinse BID   Chlorhexidine Gluconate Cloth  6 each Topical Q0600   Chlorhexidine Gluconate Cloth  6 each Topical Q0600   Chlorhexidine Gluconate Cloth  6 each Topical Q0600   cloNIDine  0.2 mg Per Tube BID   docusate  100 mg Per Tube BID   feeding supplement (PROSource TF)  45 mL Per Tube Daily   heparin  2,000 Units Dialysis Once in dialysis   heparin  5,000 Units Subcutaneous Q8H   hydrALAZINE  100 mg Per Tube Q8H   insulin aspart  0-15 Units Subcutaneous Q4H   insulin aspart  4 Units Subcutaneous Q4H   insulin detemir  24 Units Subcutaneous Q24H   insulin detemir  24 Units Subcutaneous Q24H   labetalol  300 mg Per Tube TID   mouth rinse  15 mL Mouth Rinse 10 times per day   modafinil  100 mg Per Tube Daily   multivitamin  1 tablet Per Tube QHS   OXcarbazepine  150 mg Per Tube BID   pantoprazole (PROTONIX) IV  40 mg Intravenous Daily   polyethylene glycol  17 g Per Tube Daily    sodium chloride 10 mL/hr at 09/10/21 0700   clevidipine 17 mg/hr  (09/10/21 0720)   feeding supplement (VITAL 1.5 CAL) 1,000 mL (09/10/21 0500)   levETIRAcetam Stopped (09/09/21 2333)   sodium chloride, dextrose, heparin, heparin, hydrALAZINE, labetalol, midazolam, sodium chloride flush

## 2021-09-10 NOTE — Progress Notes (Signed)
Pt transported from MRI 1 to 3T34 w/o complications. Transport, Therapist, sports and RT accompanied pt.

## 2021-09-10 NOTE — Progress Notes (Signed)
Date and time results received: 09/10/21 0340   Test: K Critical Value: 7.3  Name of Provider Notified: Jeannene Patella, MD  Orders Received? Or Actions Taken?: Orders Received - See Orders for details and Actions Taken: Albuterol, insulin, D50, and calcium gluconate administered. Will recheck labs.

## 2021-09-10 NOTE — Progress Notes (Signed)
Patients upper dentures removed and placed in denture cup with patient label at bedside.  Will inform family and oncoming nurse of this.

## 2021-09-10 NOTE — Progress Notes (Signed)
Brief neuro update note:  I reviewed the patient's MRI brain without contrast along with his MRA. Appears to show findings consistent with prior diagnosis of PRES. in addition, he has petechial hemorrhages in bilateral cerebral hemispheres.  These are probably due to PRES with complicating infarcts and hemorrhages.  The MRI brain which does not explain the absence of brainstem reflexes however.  MRI brain is markedly limited and it seems to be probably related to technique.  There is very poor overall intracranial flow related signal on the MRI which makes it somewhat difficult to interpret.  However, no definite proximal large vessel occlusion.  The MRI and the MRA do not explain the change in his status in the afternoon.  Also reviewed his EEG which is essentially flat.  At this time, I do not have a clear explanation for the change in his exam and the lack of brainstem reflexes. I briefly saw him at the bedside and he still does not have any brainstem reflexes.  One possible explanation could be elevated intracranial pressures with decrease in cerebral perfusion pressures.  However, his basilar cisterns appear patent.  Another explanation could be the T2 signal within the brainstem and cerebellum probably due to PRES but this was also noted on the prior MRI brain from 09/03/2021 and thus is not new.  [place] Pager Number [phone]

## 2021-09-10 NOTE — Progress Notes (Signed)
vLTM started  all impedances below 10kohms  [facility] to monitor  Pt event button tested

## 2021-09-10 NOTE — Progress Notes (Signed)
[facility]  Called emergently to the bedside to evaluate Jonathan Kane for an acute mental status change. Nursing noted some blood pressure changes during hemodialysis prompting a discontinuation of his cleviprex, but he is now back to his baseline (hypertensive requiring cleviprex)  On exam:  Pupils are dilated, no response to external stimuli No motor response to noxious stimuli No gag response, no cough to suctioning  Impression: Acute change in mental status, worrisome for acute CNS process (hemorrhage? Stroke? Herniation?)  Plan Stat CT head  Updated his sister at bedside  Additional cc time 30 minutes  Roselie Awkward, MD [employer] [facility] Pager: [phone] Cell: [phone] After 7:00 pm call Elink  [phone]

## 2021-09-10 NOTE — Progress Notes (Signed)
NAME:  Jonathan Kane, MRN:  [id number], DOB:  1946-02-14, LOS: 52 ADMISSION DATE:  09/08/2021, CONSULTATION DATE:  11/16 REFERRING MD:  Kommor, CHIEF COMPLAINT:  Seizure   History of Present Illness:  75 y/o male presented to the ER unresponsive with a clinical picture worrisome for status epilepticus.  He required intubaiton and sedation on admission.   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 Hyperlipidemia  Significant Hospital Events: Including procedures, antibiotic start and stop dates in addition to other pertinent events   11/16 Admitted, intubated, sedated on propofol 11/17 O/n EEG demonstrates profound diffuse encephalopathy and right hemisphere epileptogenicity 11/18 two seizures overnight, MRI brain > extensive cortically based diffuse restriction and gyrla swelling throughout both cerbral an cerebellar hemispheres form seizure, less likely PRES, unchanged baseline encephalomalacia bilateral anterior frontal and left anterior temporal lobes 11/19 no seizures on EEG 11/20 PSV attempted, "hiccuping" per RT. Switched back to [facility]. 11/21 Increased secretions, coughing. Neuro exam stable. LTM EEG. 11/22 Secretions/respiratory needs improved. Weaning PSV 5/5. Neuro exam stable, withdrawing to noxious stimuli. Likely d/c LTM EEG today.  Abx 11/19 ceftriaxone > 11/23  Proc 11/16 ett >  R IJ HD cath prior to admission >   Micro 11/16 sars cov 2 /flu > neg 11/18 resp culture > klebsiella pneumonia 11/18 blood culture >  11/18 urine culture >   Interim History / Subjective:   Hypoglycemic overnight Hyperkalemic this morning No change in neuro status  Objective   Blood pressure 123/65, pulse 68, temperature 98.1 F (36.7 C), temperature source Oral, resp. rate 17, height 5\' 10"  (1.778 m), weight 69.7 kg, SpO2 98 %.    Vent Mode: PRVC FiO2 (%):  [30 %] 30 % Set  Rate:  [6 bmp] 6 bmp Vt Set:  [450 mL] 450 mL PEEP:  [5 cmH20] 5 cmH20 Pressure Support:  [5 cmH20] 5 cmH20 Plateau Pressure:  [11 cmH20-15 cmH20] 11 cmH20   Intake/Output Summary (Last 24 hours) at 09/10/2021 6203 Last data filed at 09/10/2021 0100 Gross per 24 hour  Intake 1725.83 ml  Output 0 ml  Net 1725.83 ml   Filed Weights   09/08/21 0500 09/09/21 0500 09/10/21 0500  Weight: 66.7 kg 69.2 kg 69.7 kg    Examination:  General:  In bed on vent HENT: NCAT ETT in place PULM: CTA B, vent supported breathing CV: RRR, no mgr GI: BS+, soft, nontender MSK: normal bulk and tone Neuro: will withdraw to pain, no eye opening, no purposeful movements   Resolved Hospital Problem list   Thrombocytopenia > resolved  Assessment & Plan:  Status epilepticus Persistent acute metabolic encephalopathy after seizures PRES  Baseline TBI Modafinil per neurology Continue keppra/phenytoin EEG per neuro No sedatives Re-assess mental status after HD this morning  Acute hypoxemic respiratory failure> improved significantly, ventilator mechanics favor extubation but mental status remains the major barrier Full mechanical vent support VAP prevention Daily WUA/SBT> pressure support after HD this morning  Klebsiella PNA Monitor off antibiotics  Hypertension > severe, difficult to control Continue clonidine at current  Continue amlodipine Continue labetalol Continue cleviprex goal SBP per neurology recommendations for PRES management  DM2 >difficult to controle, labile blood sugars with hypoglycemia overnight 11/24-25  Levemir > cut in 1/2 today to 12 bid SSI  ESRD on HD MWF Hyperkalemia > worsening 11/25 HD now Monitor BMET and UOP Replace electrolytes as needed  Goals of care: I updated  his daughter Helene Kelp yesterday by phone and let her know that [name] needs HD today, his lung mechanics are OK but his mental status doesn't favor extubation.  Overall no significant  improvement in mental status.  Helene Kelp would like to speak to neurology today.   Best Practice (right click and "Reselect all SmartList Selections" daily)   Diet/type: tubefeeds DVT prophylaxis: prophylactic heparin  GI prophylaxis: PPI Lines: Dialysis Catheter and yes and it is still needed Foley:  N/A Code Status:  full code Last date of multidisciplinary goals of care discussion [11/23, plan discussion again on 11/24 ]  Labs   CBC: Recent Labs  Lab 09/07/21 0218 09/08/21 0236 09/08/21 1815 09/09/21 0238 09/10/21 0556  WBC 10.3 7.0 10.0 9.2 8.8  HGB 10.2* 10.2* 10.0* 10.8* 10.1*  HCT 31.8* 30.9* 31.0* 33.0* 30.3*  MCV 96.4 96.6 95.7 94.8 95.0  PLT 181 85* 209 202 626    Basic Metabolic Panel: Recent Labs  Lab 09/03/21 1633 09/03/21 1633 09/04/21 0709 09/04/21 1627 09/05/21 0329 09/06/21 0057 09/07/21 0218 09/08/21 0236 09/09/21 0238 09/09/21 0748 09/10/21 0221 09/10/21 0556  NA  --    < > 133*  --  131*   < > 132* 132* 133*  --  131* 129*  K  --    < > 5.0  --  4.4   < > 5.1 5.1 5.8* 6.3* 7.3* 6.5*  CL  --    < > 95*  --  95*   < > 96* 96* 95*  --  93* 91*  CO2  --    < > 21*  --  22   < > 23 22 23   --  21* 20*  GLUCOSE  --    < > 196*  --  331*   < > 215* 203* 75  --  248* 299*  BUN  --    < > 50*  --  32*   < > 41* 27* 46*  --  67* 70*  CREATININE  --    < > 9.36*  --  6.59*   < > 6.53* 4.50* 6.42*  --  8.25* 8.42*  CALCIUM  --    < > 8.3*  --  8.1*   < > 8.1* 8.3* 8.2*  --  8.2* 8.3*  MG 2.4  --  2.5* 2.1 2.2  --  2.6* 2.3 2.5*  --  2.8*  --   PHOS 5.5*  --  4.9* 4.4 2.4*  --  6.3*  --   --   --   --   --    < > = values in this interval not displayed.   GFR: Estimated Creatinine Clearance: 7.7 mL/min (A) (by C-G formula based on SCr of 8.42 mg/dL (H)). Recent Labs  Lab 09/08/21 0236 09/08/21 1815 09/09/21 0238 09/10/21 0556  WBC 7.0 10.0 9.2 8.8    Liver Function Tests: Recent Labs  Lab 09/10/21 0221  AST 51*  ALT 49*  ALKPHOS 97  BILITOT  0.6  PROT 6.9  ALBUMIN 2.7*   No results for input(s): LIPASE, AMYLASE in the last 168 hours. No results for input(s): AMMONIA in the last 168 hours.  ABG    Component Value Date/Time   PHART 7.577 (H) 09/06/2021 0442   PCO2ART 28.5 (L) 09/06/2021 0442   PO2ART 161 (H) 09/06/2021 0442   HCO3 26.6 09/06/2021 0442   TCO2 27 09/06/2021 0442   ACIDBASEDEF 1.0 08/31/2021 1552   O2SAT 100.0  09/06/2021 0442     Coagulation Profile: No results for input(s): INR, PROTIME in the last 168 hours.  Cardiac Enzymes: No results for input(s): CKTOTAL, CKMB, CKMBINDEX, TROPONINI in the last 168 hours.  HbA1C: Hgb A1c MFr Bld  Date/Time Value Ref Range Status  08/08/2021 02:25 AM 10.6 (H) 4.8 - 5.6 % Final    Comment:    (NOTE) Pre diabetes:          5.7%-6.4%  Diabetes:              >6.4%  Glycemic control for   <7.0% adults with diabetes   06/01/2021 03:14 AM 11.5 (H) 4.8 - 5.6 % Final    Comment:    (NOTE) Pre diabetes:          5.7%-6.4%  Diabetes:              >6.4%  Glycemic control for   <7.0% adults with diabetes     CBG: Recent Labs  Lab 09/09/21 1929 09/09/21 2328 09/10/21 0207 09/10/21 0305 09/10/21 0504  GLUCAP 110* 73 210* 250* 287*    Critical care time: 35 minutes     Roselie Awkward, MD [place] [facility] Pager: [phone] Cell: [phone] After 7:00 pm call Elink  [phone]

## 2021-09-10 NOTE — Progress Notes (Signed)
STAT EEG complete - results pending. ? ?

## 2021-09-11 DIAGNOSIS — G40901 Epilepsy, unspecified, not intractable, with status epilepticus: Secondary | ICD-10-CM | POA: Diagnosis not present

## 2021-09-11 DIAGNOSIS — N186 End stage renal disease: Secondary | ICD-10-CM | POA: Diagnosis not present

## 2021-09-11 DIAGNOSIS — J9602 Acute respiratory failure with hypercapnia: Secondary | ICD-10-CM | POA: Diagnosis not present

## 2021-09-11 DIAGNOSIS — E43 Unspecified severe protein-calorie malnutrition: Secondary | ICD-10-CM | POA: Diagnosis not present

## 2021-09-11 LAB — GLUCOSE, CAPILLARY
Glucose-Capillary: 115 mg/dL — ABNORMAL HIGH (ref 70–99)
Glucose-Capillary: 117 mg/dL — ABNORMAL HIGH (ref 70–99)
Glucose-Capillary: 199 mg/dL — ABNORMAL HIGH (ref 70–99)
Glucose-Capillary: 207 mg/dL — ABNORMAL HIGH (ref 70–99)
Glucose-Capillary: 213 mg/dL — ABNORMAL HIGH (ref 70–99)
Glucose-Capillary: 223 mg/dL — ABNORMAL HIGH (ref 70–99)
Glucose-Capillary: 227 mg/dL — ABNORMAL HIGH (ref 70–99)
Glucose-Capillary: 254 mg/dL — ABNORMAL HIGH (ref 70–99)
Glucose-Capillary: 286 mg/dL — ABNORMAL HIGH (ref 70–99)
Glucose-Capillary: 289 mg/dL — ABNORMAL HIGH (ref 70–99)

## 2021-09-11 LAB — RENAL FUNCTION PANEL
Albumin: 2.9 g/dL — ABNORMAL LOW (ref 3.5–5.0)
Anion gap: 15 (ref 5–15)
BUN: 40 mg/dL — ABNORMAL HIGH (ref 8–23)
CO2: 23 mmol/L (ref 22–32)
Calcium: 8.4 mg/dL — ABNORMAL LOW (ref 8.9–10.3)
Chloride: 96 mmol/L — ABNORMAL LOW (ref 98–111)
Creatinine, Ser: 5.68 mg/dL — ABNORMAL HIGH (ref 0.61–1.24)
GFR, Estimated: 10 mL/min — ABNORMAL LOW (ref >60)
Glucose, Bld: 289 mg/dL — ABNORMAL HIGH (ref 70–99)
Phosphorus: 8.6 mg/dL — ABNORMAL HIGH (ref 2.5–4.6)
Potassium: 5.4 mmol/L — ABNORMAL HIGH (ref 3.5–5.1)
Sodium: 134 mmol/L — ABNORMAL LOW (ref 135–145)

## 2021-09-11 LAB — MAGNESIUM: Magnesium: 2.6 mg/dL — ABNORMAL HIGH (ref 1.7–2.4)

## 2021-09-11 MED ORDER — GLYCOPYRROLATE 0.2 MG/ML IJ SOLN: 0.2000 mg | INTRAMUSCULAR | Status: DC | PRN

## 2021-09-11 MED ORDER — DIPHENHYDRAMINE HCL 50 MG/ML IJ SOLN: 25.0000 mg | INTRAMUSCULAR | Status: DC | PRN

## 2021-09-11 MED ORDER — MORPHINE SULFATE (PF) 2 MG/ML IV SOLN: 2.0000 mg | INTRAVENOUS | Status: DC | PRN

## 2021-09-11 MED ORDER — ALBUMIN HUMAN 25 % IV SOLN
12.5000 g | Freq: Once | INTRAVENOUS | Status: AC
  Administered 2021-09-11: 12.5 g via INTRAVENOUS
  Filled 2021-09-11: qty 50

## 2021-09-11 MED ORDER — ACETAMINOPHEN 325 MG PO TABS: 650.0000 mg | ORAL_TABLET | Freq: Four times a day (QID) | ORAL | Status: DC | PRN

## 2021-09-11 MED ORDER — LABETALOL HCL 200 MG PO TABS: 300.0000 mg | ORAL_TABLET | Freq: Three times a day (TID) | ORAL | Status: DC

## 2021-09-11 MED ORDER — GLYCOPYRROLATE 1 MG PO TABS: 1.0000 mg | ORAL_TABLET | ORAL | Status: DC | PRN

## 2021-09-11 MED ORDER — SODIUM ZIRCONIUM CYCLOSILICATE 10 G PO PACK
10.0000 g | PACK | Freq: Three times a day (TID) | ORAL | Status: DC
Start: 2021-09-11 — End: 2021-09-11

## 2021-09-11 MED ORDER — DEXTROSE 5 % IV SOLN: INTRAVENOUS | Status: DC

## 2021-09-11 MED ORDER — POLYVINYL ALCOHOL 1.4 % OP SOLN
1.0000 [drp] | Freq: Four times a day (QID) | OPHTHALMIC | Status: DC | PRN
  Filled 2021-09-11: qty 15

## 2021-09-11 MED ORDER — ACETAMINOPHEN 650 MG RE SUPP: 650.0000 mg | Freq: Four times a day (QID) | RECTAL | Status: DC | PRN

## 2021-09-16 NOTE — Progress Notes (Signed)
LTM disconnected. No skin breakdown noted. Results pending. Impedances = good.

## 2021-09-16 NOTE — Progress Notes (Signed)
[facility] Progress Note  Subjective:  Had a change in neuro exam-  now no brainstem reflexes-   BP now low.  Had HD yesterday with 3 liters removed   Vitals:   September 17, 2021 0700 2021-09-17 0714 09-17-21 0731 2021/09/17 0742  BP: (!) 96/57  (!) 90/59   Pulse: 78  79 79  Resp: '16  16 16  ' Temp:  98.1 F (36.7 C)    TempSrc:  Oral    SpO2: 100%  100% 100%  Weight:      Height:        Exam: on vent , only responsive to noxious stimuli  no jvd  throat ett in place  Chest cta bilat and lat  + tdc  Cor reg no RG  Abd soft ntnd no ascites   Ext no LE edema   Neuro on vent and sedated, not following commands   RIJ TDC/ LUA AVF +bruit   Home meds include - asa, lipitor, diltiazem 180, hydralazine 25 tid, norco prn, keppra 1563m bid + 5047mpost HD mwf, dilantin 75 tid, renvela 3 ac tid, sod bicarb 650 bid, tradjenta 5 qd, tresiba 22u qd, prns/ vits/ supps   67.6kg    OP HD: MWF [place]   3h 4533m 400/1.5   2/2 bath  65.5kg  Hep 2000  RIJ TDC / LUA BC AVF placed 08/25/21 (Dr ClaCarlis Abbott - venofer 100 x 10  - mircera 60 q2, last 11/2       Assessment/ Plan: Status epilepticus - per CCM/ neuro, intubated. EEG monitoring. AED's per neuro. Seizures improved, but MRI w/ sig changes that likely represent permanent injury, per neurology.  Worsening status-  prognosis poor-  family discussions ongoing VDRF - per CCM-   HTN emergency - BP's high on admit. NG hydralazine , amlodipine and labetalol and then clonidine- had been weaned off of IV clevidipine-  now using PRN and needing titrating up. No obvious vol excess,  -  challenging volume with HD has helped BP at least during treatments-- now BP is low associated with change in neuro exam-  off clevidipine-  I put parameters on his 4 BP meds so he will not get them now-  did not stop orders however Volume - no obvious vol excess,  prob losing wt. UF goal 2-3 L per HD  ESRD - on HD MWF. HD today per holiday schedule. Sun-Tues-Fri this  week. Done last 11/26-  [facility] as that had been an issue DM uncont - sp IV insulin gtt, now is on SQ insulin.   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  With his change in status not sure he will make it to Monday     Jonathan Kane A Rebbie Lauricella   08/28/01/22:05 AM   Recent Labs  Lab 09/05/21 0329 09/06/21 0057 09/07/21 0218 09/08/21 0236 09/09/21 0238 09/09/21 0748 09/10/21 0221 09/10/21 0556  K 4.4   < > 5.1   < > 5.8*   < > 7.3* 6.5*  BUN 32*   < > 41*   < >  46*  --  67* 70*  CREATININE 6.59*   < > 6.53*   < > 6.42*  --  8.25* 8.42*  CALCIUM 8.1*   < > 8.1*   < > 8.2*  --  8.2* 8.3*  PHOS 2.4*  --  6.3*  --   --   --   --   --   HGB 11.2*   < > 10.2*   < > 10.8*  --   --  10.1*   < > = values in this interval not displayed.   Inpatient medications:  amLODipine  10 mg Per Tube Daily   aspirin  81 mg Per Tube Daily   atorvastatin  40 mg Per Tube Daily   chlorhexidine gluconate (MEDLINE KIT)  15 mL Mouth Rinse BID   Chlorhexidine Gluconate Cloth  6 each Topical Q0600   cloNIDine  0.2 mg Per Tube BID   docusate  100 mg Per Tube BID   feeding supplement (PROSource TF)  45 mL Per Tube Daily   heparin  2,000 Units Dialysis Once in dialysis   heparin  5,000 Units Subcutaneous Q8H   hydrALAZINE  100 mg Per Tube Q8H   insulin aspart  0-15 Units Subcutaneous Q4H   insulin aspart  4 Units Subcutaneous Q4H   insulin detemir  12 Units Subcutaneous BID   labetalol  300 mg Per Tube TID   mouth rinse  15 mL Mouth Rinse 10 times per day   modafinil  100 mg Per Tube Daily   multivitamin  1 tablet Per Tube QHS   OXcarbazepine  150 mg Per Tube BID   pantoprazole  (PROTONIX) IV  40 mg Intravenous Daily   polyethylene glycol  17 g Per Tube Daily    sodium chloride 10 mL/hr at Oct 07, 2021 0600   clevidipine Stopped (09/10/21 2220)   feeding supplement (VITAL 1.5 CAL) 1,000 mL (10/07/21 0000)   levETIRAcetam Stopped (09/10/21 2134)   sodium chloride, dextrose, heparin, heparin, hydrALAZINE, labetalol, midazolam, sodium chloride flush

## 2021-09-16 NOTE — Progress Notes (Signed)
eLink Physician-Brief Progress Note Patient Name: Jonathan Kane DOB: 08-10-1946 MRN: [id number]   Date of Service  10-07-2021  HPI/Events of Note  Hypotension - BP = 82/56 with MAP = 64.  eICU Interventions  Plan: 25% Albumin 12.5 gm IV X 1 now.      Intervention Category Major Interventions: Hypotension - evaluation and management  Mansur Patti Eugene 10-07-2021, 1:54 AM

## 2021-09-16 NOTE — Plan of Care (Signed)
D/w Dr. Lake Bells, family understands patient's prognosis is extremely poor and have decided to withdraw care. Neurology will sign off, but please re-engage if we can be of any further assistance.  Su Monks, MD [facility] [phone]  If 7pm- 7am, please page neurology on call as listed in [place].

## 2021-09-16 NOTE — Progress Notes (Signed)
[facility]  Came to bedside to meet with family They confirm desire to withdraw care Orders written  Roselie Awkward, MD [place] [facility] Pager: [phone] Cell: [phone] After 7:00 pm call Elink  [phone]

## 2021-09-16 NOTE — Progress Notes (Signed)
NAME:  Jonathan Kane, MRN:  [id number], DOB:  Jul 26, 1946, LOS: 32 ADMISSION DATE:  08/28/2021, CONSULTATION DATE:  11/16 REFERRING MD:  Kommor, CHIEF COMPLAINT:  Seizure   History of Present Illness:  75 y/o male presented to the ER unresponsive with a clinical picture worrisome for status epilepticus.  He required intubaiton and sedation on admission.   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 Hyperlipidemia  Significant Hospital Events: Including procedures, antibiotic start and stop dates in addition to other pertinent events   11/16 Admitted, intubated, sedated on propofol 11/17 O/n EEG demonstrates profound diffuse encephalopathy and right hemisphere epileptogenicity 11/18 two seizures overnight, MRI brain > extensive cortically based diffuse restriction and gyrla swelling throughout both cerbral an cerebellar hemispheres form seizure, less likely PRES, unchanged baseline encephalomalacia bilateral anterior frontal and left anterior temporal lobes 11/19 no seizures on EEG 11/20 PSV attempted, "hiccuping" per RT. Switched back to [facility]. 11/21 Increased secretions, coughing. Neuro exam stable. LTM EEG. 11/22 Secretions/respiratory needs improved. Weaning PSV 5/5. Neuro exam stable, withdrawing to noxious stimuli. Likely d/c LTM EEG today. 11/25 sudden change in neuro exam in afternoon, no brainstem reflexes, GCS 3; CT head: changes consistent with PRES, possible new small SAH; MRI Brain: findings consistent with PRES, some petechial hemorrhages, some cerebral sulcal effacement and effaement of basal cisterns, some cerebral edema, no midline shift or herniation; TBI changes seen again, MRA poor study; EEG profound diffuse encephalopathy, non-specific etiology  Abx 11/19 ceftriaxone > 11/23  Proc 11/16 ett >  R IJ HD cath prior to admission >   Micro 11/16 sars cov 2 /flu >  neg 11/18 resp culture > klebsiella pneumonia 11/18 blood culture >  11/18 urine culture >   Interim History / Subjective:   11/25 sudden change in neuro exam in afternoon, no brainstem reflexes, GCS 3; CT head: changes consistent with PRES, possible new small SAH; MRI Brain: findings consistent with PRES, some petechial hemorrhages, some cerebral sulcal effacement and effaement of basal cisterns, some cerebral edema, no midline shift or herniation; TBI changes seen again, MRA poor study; EEG profound diffuse encephalopathy, non-specific etiology  Objective   Blood pressure (!) 96/57, pulse 78, temperature 97.9 F (36.6 C), temperature source Oral, resp. rate 16, height 5\' 10"  (1.778 m), weight 68.4 kg, SpO2 100 %.    Vent Mode: PRVC FiO2 (%):  [30 %] 30 % Set Rate:  [16 bmp] 16 bmp Vt Set:  [450 mL] 450 mL PEEP:  [5 cmH20] 5 cmH20 Plateau Pressure:  [15 cmH20-22 cmH20] 16 cmH20   Intake/Output Summary (Last 24 hours) at Oct 03, 2021 6759 Last data filed at 10-03-2021 0600 Gross per 24 hour  Intake 2196.95 ml  Output 4100 ml  Net -1903.05 ml   Filed Weights   09/10/21 0932 09/10/21 1250 10-03-2021 0140  Weight: 72 kg 69 kg 68.4 kg    Examination:  General:  In bed on vent HENT: NCAT ETT in place PULM: CTA B, vent supported breathing CV: RRR, no mgr GI: BS+, soft, nontender MSK: normal bulk and tone Neuro: pupils 6-67mm fixed/dilated, no motor response, no gag, no cough    Resolved Hospital Problem list   Thrombocytopenia > resolved Klebsiella PNA  Assessment & Plan:  Status epilepticus Persistent acute metabolic encephalopathy after seizures PRES  Baseline TBI 11/25 loss of brainstem reflexes; exam consistent with severe CNS injury but of undetermined etiology as imaging is  unrevealing Continue keppra/phenytoin unless otherwise directed by neurology No sedation Discuss goals of care with family  Acute hypoxemic respiratory failure> now not initiating any breaths,  ventilator dependent Full mechanical vent support VAP prevention Daily WUA/SBT  Hypertension > severe, difficult to control; 11/26 now midline hypotensive, likely related to acute CNS injury Hold labetalol and clonidine Hold cleviprex Monitor hemodynamics in ICU setting  DM2 >difficult to controle, labile blood sugars with hypoglycemia overnight 11/24-25  Levemir/ssi to continue  ESRD on HD MWF Hyperkalemia > worsening 11/25, BMET pending 11/26 Monitor BMET and UOP > send again 11/26 Replace electrolytes as needed  HD per renal  Goals of care: I spoke to Clarene Critchley (daughter) this morning and updated her about the severity of his illness and the fact that we need to discuss withdrawal care.  They plan to come today for that conversation.  Best Practice (right click and "Reselect all SmartList Selections" daily)   Diet/type: tubefeeds DVT prophylaxis: prophylactic heparin  GI prophylaxis: PPI Lines: Dialysis Catheter and yes and it is still needed Foley:  N/A Code Status:  full code Last date of multidisciplinary goals of care discussion [11/23, see above]  Labs   CBC: Recent Labs  Lab 09/07/21 0218 09/08/21 0236 09/08/21 1815 09/09/21 0238 09/10/21 0556  WBC 10.3 7.0 10.0 9.2 8.8  HGB 10.2* 10.2* 10.0* 10.8* 10.1*  HCT 31.8* 30.9* 31.0* 33.0* 30.3*  MCV 96.4 96.6 95.7 94.8 95.0  PLT 181 85* 209 202 086    Basic Metabolic Panel: Recent Labs  Lab 09/04/21 1627 09/05/21 0329 09/06/21 0057 09/07/21 0218 09/08/21 0236 09/09/21 0238 09/09/21 0748 09/10/21 0221 09/10/21 0556 Sep 29, 2021 0317  NA  --  131*   < > 132* 132* 133*  --  131* 129*  --   K  --  4.4   < > 5.1 5.1 5.8* 6.3* 7.3* 6.5*  --   CL  --  95*   < > 96* 96* 95*  --  93* 91*  --   CO2  --  22   < > 23 22 23   --  21* 20*  --   GLUCOSE  --  331*   < > 215* 203* 75  --  248* 299*  --   BUN  --  32*   < > 41* 27* 46*  --  67* 70*  --   CREATININE  --  6.59*   < > 6.53* 4.50* 6.42*  --  8.25* 8.42*  --    CALCIUM  --  8.1*   < > 8.1* 8.3* 8.2*  --  8.2* 8.3*  --   MG 2.1 2.2  --  2.6* 2.3 2.5*  --  2.8*  --  2.6*  PHOS 4.4 2.4*  --  6.3*  --   --   --   --   --   --    < > = values in this interval not displayed.   GFR: Estimated Creatinine Clearance: 7.6 mL/min (A) (by C-G formula based on SCr of 8.42 mg/dL (H)). Recent Labs  Lab 09/08/21 0236 09/08/21 1815 09/09/21 0238 09/10/21 0556  WBC 7.0 10.0 9.2 8.8    Liver Function Tests: Recent Labs  Lab 09/10/21 0221  AST 51*  ALT 49*  ALKPHOS 97  BILITOT 0.6  PROT 6.9  ALBUMIN 2.7*   No results for input(s): LIPASE, AMYLASE in the last 168 hours. No results for input(s): AMMONIA in the last 168 hours.  ABG  Component Value Date/Time   PHART 7.577 (H) 09/06/2021 0442   PCO2ART 28.5 (L) 09/06/2021 0442   PO2ART 161 (H) 09/06/2021 0442   HCO3 26.6 09/06/2021 0442   TCO2 27 09/06/2021 0442   ACIDBASEDEF 1.0 08/31/2021 1552   O2SAT 100.0 09/06/2021 0442     Coagulation Profile: No results for input(s): INR, PROTIME in the last 168 hours.  Cardiac Enzymes: No results for input(s): CKTOTAL, CKMB, CKMBINDEX, TROPONINI in the last 168 hours.  HbA1C: Hgb A1c MFr Bld  Date/Time Value Ref Range Status  08/08/2021 02:25 AM 10.6 (H) 4.8 - 5.6 % Final    Comment:    (NOTE) Pre diabetes:          5.7%-6.4%  Diabetes:              >6.4%  Glycemic control for   <7.0% adults with diabetes   06/01/2021 03:14 AM 11.5 (H) 4.8 - 5.6 % Final    Comment:    (NOTE) Pre diabetes:          5.7%-6.4%  Diabetes:              >6.4%  Glycemic control for   <7.0% adults with diabetes     CBG: Recent Labs  Lab October 02, 2021 0441 2021-10-02 0544 10-02-2021 0644 10/02/21 0645 Oct 02, 2021 0714  GLUCAP 286* 223* 115* 199* 207*    Critical care time: 31 minutes     Roselie Awkward, MD [place] [facility] Pager: [phone] Cell: [phone] After 7:00 pm call Elink  [phone]

## 2021-09-16 NOTE — Procedures (Addendum)
Patient Name: Jonathan Kane  MRN: [id number]  Epilepsy Attending: Lora Havens  Referring Physician/Provider: Dr Su Monks Duration: 09/10/2021 2028 to 15-Sep-2021 1221   Patient history: 75yo M who initially presented with status epilepticus which resolved, now with sudden worsening of mental status. EEG to evaluate for seizure.   Level of alertness:  comatose   AEDs during EEG study: PHT, LEV   Technical aspects: This EEG study was done with scalp electrodes positioned according to the 10-20 International system of electrode placement. Electrical activity was acquired at a sampling rate of 500Hz  and reviewed with a high frequency filter of 70Hz  and a low frequency filter of 1Hz . EEG data were recorded continuously and digitally stored.    Description: EEG showed continuous generalized background suppression. EEG was not reactive to tactile stimulation. Hyperventilation and photic stimulation were not performed.      ABNORMALITY - Background suppression, generalized   IMPRESSION: This study is suggestive of profound diffuse encephalopathy, nonspecific etiology. No seizures or epileptiform discharges were seen throughout the recording.   Deerica Waszak [name]

## 2021-09-16 NOTE — Progress Notes (Signed)
[facility] P[facility]M  Blood pressure plummeting Discussed with his daughter Code status DNR We will withdraw care after they can come in and visit No vasopressors  Roselie Awkward, MD [place] [facility] Pager: [phone] Cell: [phone] After 7:00 pm call Elink  [phone]

## 2021-09-16 NOTE — Death Summary Note (Signed)
DEATH SUMMARY   Patient Details  Name: Jonathan Kane MRN: [id number] DOB: Feb 25, 1946  Admission/Discharge Information   Admit Date:  09-28-2021  Date of Death: Date of Death: 2021-10-08  Time of Death: Time of Death: January 28, 1412  Length of Stay: 01-21-2023  Referring Physician: Cipriano Mile, NP   Reason(s) for Hospitalization  Seizure  Diagnoses  Preliminary cause of death:  PRES Secondary Diagnoses (including complications and co-morbidities):  Active Problems:   Status epilepticus ([place])   ESRD (end stage renal disease) (HCC)   Protein-calorie malnutrition, severe   Acute hypercapnic respiratory failure (HCC) Hypertensive emergency Baseline traumatic brain injury DM2 with hyperglycemia  Brief Hospital Course (including significant findings, care, treatment, and services provided and events leading to death)  75 y/o male presented to the ER unresponsive with a clinical picture worrisome for status epilepticus.  He required intubaiton and sedation on admission.   Hospital course was as follows: 2023/09/29 Admitted, intubated, sedated on propofol 11/17 O/n EEG demonstrates profound diffuse encephalopathy and right hemisphere epileptogenicity 11/18 two seizures overnight, MRI brain > extensive cortically based diffuse restriction and gyrla swelling throughout both cerbral an cerebellar hemispheres form seizure, less likely PRES, unchanged baseline encephalomalacia bilateral anterior frontal and left anterior temporal lobes 11/19 no seizures on EEG 11/20 PSV attempted, "hiccuping" per RT. Switched back to [facility]. 11/21 Increased secretions, coughing. Neuro exam stable. LTM EEG. 11/22 Secretions/respiratory needs improved. Weaning PSV 5/5. Neuro exam stable, withdrawing to noxious stimuli. Likely d/c LTM EEG today. 11/25 sudden change in neuro exam in afternoon, no brainstem reflexes, GCS 3; CT head: changes consistent with PRES, possible new small SAH; MRI Brain: findings consistent with PRES, some  petechial hemorrhages, some cerebral sulcal effacement and effaement of basal cisterns, some cerebral edema, no midline shift or herniation; TBI changes seen again, MRA poor study; EEG profound diffuse encephalopathy, non-specific etiology  After discussing the changes from the neuro exam on 11/25 with family they elected to withdraw care.  Pertinent Labs and Studies  Significant Diagnostic Studies CT HEAD WO CONTRAST (5MM)  Addendum Date: 09/10/2021   ADDENDUM REPORT: 09/10/2021 17:22 ADDENDUM: Impression #2 called by telephone at the time of interpretation on 09/10/2021 at 5:09 pm to provider [facility] , who verbally acknowledged these results. Electronically Signed   By: Kellie Simmering D.O.   On: 09/10/2021 17:22   Result Date: 09/10/2021 CLINICAL DATA:  Seizure, abnormal neuro exam, sudden change in mental status. EXAM: CT HEAD WITHOUT CONTRAST TECHNIQUE: Contiguous axial images were obtained from the base of the skull through the vertex without intravenous contrast. COMPARISON:  Brain MRI 09/03/2021. CT angiogram head/neck 09/28/21. Noncontrast head CT 09/28/2021. FINDINGS: Brain: Redemonstrated chronic encephalomalacia/gliosis within the anteroinferior frontal lobes and anterior left temporal lobe, likely posttraumatic in etiology. There is patchy cortical/subcortical edema throughout the bilateral cerebral hemispheres, better appreciated on the prior brain MRI of 09/03/2021. No significant mass effect at this time. New from prior examinations, there is subtle curvilinear hyperdensity along the posterior right temporal lobe and lateral right occipital lobe, which may reflect vessels. However, trace acute subarachnoid hemorrhage cannot be excluded. No evidence of an intracranial mass. No midline shift. Vascular: Atherosclerotic calcifications. Skull: Normal. Negative for fracture or focal lesion. Sinuses/Orbits: Visualized orbits show no acute finding. Trace scattered paranasal sinus mucosal  thickening. Attempts are being made to reach the ordering provider at this time. IMPRESSION: Patchy cortical/subcortical edema throughout the bilateral cerebral hemispheres, better appreciated on the prior brain MRI of 09/03/2021. Primary differential considerations are unchanged  and include sequela of posterior reversible encephalopathy syndrome (PRES), seizure related edema and/or acute/early subacute infarcts. No significant mass effect at this time. New from prior examinations, there is subtle curvilinear hyperdensity along the posterior right temporal lobe and lateral right occipital lobe, which may reflect vessels. However, trace acute subarachnoid hemorrhage cannot be excluded. Consider short-interval noncontrast head CT follow-up. Redemonstrated foci of chronic encephalomalacia/gliosis within the anteroinferior frontal lobes and anterior left temporal lobe, likely posttraumatic in etiology. Electronically Signed: By: Kellie Simmering D.O. On: 09/10/2021 17:00   MR ANGIO HEAD WO CONTRAST  Result Date: 09/10/2021 CLINICAL DATA:  Neuro deficit, acute, stroke suspected; question subarachnoid hemorrhage on CT head, unresponsive, fixed dilated pupils. EXAM: MRI HEAD WITHOUT CONTRAST MRA HEAD WITHOUT CONTRAST TECHNIQUE: Multiplanar, multi-echo pulse sequences of the brain and surrounding structures were acquired without intravenous contrast. Angiographic images of the Circle of Willis were acquired using MRA technique without intravenous contrast. COMPARISON:  Prior head CT examinations 09/10/2021 and earlier. Brain MRI 09/03/2021. FINDINGS: MRI HEAD FINDINGS Brain: Similar in extent as compared to the prior MRI of 09/03/2021, there is extensive patchy cortical/subcortical lesion weighted and T2 FLAIR signal abnormality within the bilateral cerebral hemispheres, as well as small patchy foci of T2 FLAIR hyperintense signal abnormality within the bilateral brainstem and cerebellar hemispheres. However, new from this  prior examination, there is patchy gyriform SWI signal loss and T1 hyperintensity within the bilateral cerebral hemispheres compatible with petechial hemorrhage. 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 There is some cerebral sulcal effacement and effacement of the basal cisterns, likely reflecting a component of cerebral edema. However, there is no midline shift or evidence of herniation at this time. Redemonstrated chronic encephalomalacia/gliosis within the anteroinferior frontal lobes and anterior left temporal lobe, likely posttraumatic in etiology. No extra-axial fluid collection. No midline shift. Vascular: Maintained flow voids within the proximal large arterial vessels. Skull and upper cervical spine: No focal suspicious marrow lesion. Sinuses/Orbits: Visualized orbits show no acute finding. Trace scattered paranasal sinus mucosal thickening at the imaged levels. MRA HEAD FINDINGS The examination is markedly limited by technique, with overall poor intracranial flow-related signal. Within this limitations, no definite proximal large vessel occlusion is identified. These results were called by telephone at the time of interpretation on 09/10/2021 at 7:46 pm to provider Dr. Milas Gain, who verbally acknowledged these results. IMPRESSION: MRI brain: 1. Similar in extent as compared to the prior MRI of 09/03/2021, there is extensive patchy cortical/subcortical diffusion-weighted and T2 FLAIR signal abnormality within the bilateral cerebral hemispheres, as well as small patchy foci of T2 FLAIR signal abnormality within the bilateral brainstem and cerebellar hemispheres. However, new from the prior MRI, there is patchy gyriform SWI signal loss and T1 hyperintensity within the bilateral cerebral hemispheres compatible with petechial hemorrhage. These findings are favored to reflect sequela of posterior reversible  encephalopathy syndrome (PRES) with complicating infarcts and hemorrhage. There is some cerebral sulcal effacement and effacement of the basal cisterns, likely reflecting a component of cerebral edema. However, there is no midline shift or evidence of herniation at this time. 2. Redemonstrated foci of chronic encephalomalacia/gliosis within the anteroinferior frontal lobes and anterior left temporal lobe, likely posttraumatic in etiology. MRA head: The examination is markedly limited by technique, with overall poor intracranial flow-related signal. Within this limitations, no definite proximal large vessel occlusion is identified. Electronically Signed   By: Kellie Simmering D.O.   On: 09/10/2021 19:47   MR  BRAIN WO CONTRAST  Result Date: 09/10/2021 CLINICAL DATA:  Neuro deficit, acute, stroke suspected; question subarachnoid hemorrhage on CT head, unresponsive, fixed dilated pupils. EXAM: MRI HEAD WITHOUT CONTRAST MRA HEAD WITHOUT CONTRAST TECHNIQUE: Multiplanar, multi-echo pulse sequences of the brain and surrounding structures were acquired without intravenous contrast. Angiographic images of the Circle of Willis were acquired using MRA technique without intravenous contrast. COMPARISON:  Prior head CT examinations 09/10/2021 and earlier. Brain MRI 09/03/2021. FINDINGS: MRI HEAD FINDINGS Brain: Similar in extent as compared to the prior MRI of 09/03/2021, there is extensive patchy cortical/subcortical lesion weighted and T2 FLAIR signal abnormality within the bilateral cerebral hemispheres, as well as small patchy foci of T2 FLAIR hyperintense signal abnormality within the bilateral brainstem and cerebellar hemispheres. However, new from this prior examination, there is patchy gyriform SWI signal loss and T1 hyperintensity within the bilateral cerebral hemispheres compatible with petechial hemorrhage. 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 There is some cerebral sulcal effacement and effacement of the basal cisterns, likely reflecting a component of cerebral edema. However, there is no midline shift or evidence of herniation at this time. Redemonstrated chronic encephalomalacia/gliosis within the anteroinferior frontal lobes and anterior left temporal lobe, likely posttraumatic in etiology. No extra-axial fluid collection. No midline shift. Vascular: Maintained flow voids within the proximal large arterial vessels. Skull and upper cervical spine: No focal suspicious marrow lesion. Sinuses/Orbits: Visualized orbits show no acute finding. Trace scattered paranasal sinus mucosal thickening at the imaged levels. MRA HEAD FINDINGS The examination is markedly limited by technique, with overall poor intracranial flow-related signal. Within this limitations, no definite proximal large vessel occlusion is identified. These results were called by telephone at the time of interpretation on 09/10/2021 at 7:46 pm to provider Dr. Milas Gain, who verbally acknowledged these results. IMPRESSION: MRI brain: 1. Similar in extent as compared to the prior MRI of 09/03/2021, there is extensive patchy cortical/subcortical diffusion-weighted and T2 FLAIR signal abnormality within the bilateral cerebral hemispheres, as well as small patchy foci of T2 FLAIR signal abnormality within the bilateral brainstem and cerebellar hemispheres. However, new from the prior MRI, there is patchy gyriform SWI signal loss and T1 hyperintensity within the bilateral cerebral hemispheres compatible with petechial hemorrhage. 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 There is some cerebral sulcal effacement and effacement of the basal cisterns, likely reflecting a component of cerebral edema. However, there is no midline shift or evidence of herniation at this  time. 2. Redemonstrated foci of chronic encephalomalacia/gliosis within the anteroinferior frontal lobes and anterior left temporal lobe, likely posttraumatic in etiology. MRA head: The examination is markedly limited by technique, with overall poor intracranial flow-related signal. Within this limitations, no definite proximal large vessel occlusion is identified. Electronically Signed   By: Kellie Simmering D.O.   On: 09/10/2021 19:47   MR BRAIN WO CONTRAST  Result Date: 09/03/2021 CLINICAL DATA:  Seizure, abnormal neuro exam EXAM: MRI HEAD WITHOUT CONTRAST TECHNIQUE: Multiplanar, multiecho pulse sequences of the brain and surrounding structures were obtained without intravenous contrast. COMPARISON:  CT head 08/21/2021 FINDINGS: Brain: There is extensive cortically based diffusion restriction and T2/FLAIR signal abnormality throughout the bilateral cerebral and cerebellar hemispheres across multiple vascular distributions. There is no evidence of associated hemorrhage. There is gyral swelling effacement of the adjacent sulci. Encephalomalacia in the bilateral frontal and left anterior temporal lobes is again seen consistent  with sequela of prior trauma. The background of global parenchymal volume loss and ex vacuo dilatation of the ventricular system is unchanged. There is no solid mass lesion.  There is no midline shift. Vascular: Normal flow voids. Skull and upper cervical spine: Normal marrow signal. Sinuses/Orbits: The imaged paranasal sinuses are clear. Bilateral lens implants are in place. The globes and orbits are otherwise unremarkable. Other: Fluid is noted in the oropharynx likely related to instrumentation. IMPRESSION: 1. Extensive cortically based diffusion restriction and FLAIR signal abnormality with gyral swelling throughout both cerebral and cerebellar hemispheres. This finding is favored to reflect sequela of seizures, with multiple acute infarcts or PRES considered less likely. 2. Unchanged  encephalomalacia in the bilateral anterior frontal and left anterior temporal lobes consistent with sequela of prior trauma. Electronically Signed   By: Valetta Mole M.D.   On: 09/03/2021 13:40   DG Chest Port 1 View  Result Date: 09/07/2021 CLINICAL DATA:  Intubation. EXAM: PORTABLE CHEST 1 VIEW COMPARISON:  Chest x-ray 09/03/2021. FINDINGS: NG tube noted with its tip over the lower esophagus, advancement of approximately 20 cm should be considered. Endotracheal tube and dual-lumen catheter in stable position. Heart size normal. Mild left base pleural-parenchymal thickening most consistent scarring. No acute infiltrate. No pleural effusion or pneumothorax. IMPRESSION: 1. NG tube noted with tip over the lower esophagus, advancement of approximately 20 cm should be considered. 2.  Endotracheal tube and dual-lumen catheter stable position. 3.  No acute cardiopulmonary disease. These results will be called to the ordering clinician or representative by the Radiologist Assistant, and communication documented in the PACS or [employer]. Electronically Signed   By: Marcello Moores  Register M.D.   On: 09/07/2021 06:20   DG CHEST PORT 1 VIEW  Result Date: 09/03/2021 CLINICAL DATA:  Shortness of breath EXAM: PORTABLE CHEST 1 VIEW COMPARISON:  Previous studies including the examination of 09/02/2021 FINDINGS: Cardiac size is within normal limits. Mediastinum is unremarkable. There are no signs of pulmonary edema or new focal infiltrates. Small transverse linear density in the left lower lung fields may suggest scarring or subsegmental atelectasis. There is no significant pleural effusion or pneumothorax. Tip of endotracheal tube is approximately 8.3 cm above the carina. Tip of dialysis catheter is seen in the right atrium. IMPRESSION: There are no new infiltrates or signs of pulmonary edema. Electronically Signed   By: Elmer Picker M.D.   On: 09/03/2021 09:58   Portable Chest xray  Result Date:  09/02/2021 CLINICAL DATA:  Respiratory failure EXAM: PORTABLE CHEST 1 VIEW COMPARISON:  Chest radiograph dated 1 day prior FINDINGS: The endotracheal tube tip is in the midthoracic trachea approximately 3.7 cm from the carina. A right-sided vascular catheter terminates in the right atrium. The enteric catheter is in place with the tip coursing off the field of view. There is a small left pleural effusion which has increased in conspicuity. There is no focal consolidation or pulmonary edema. There is no right pleural effusion. There is no appreciable pneumothorax. The bones are stable. IMPRESSION: 1. Support devices as above. 2. Increased size of a small left pleural effusion. Electronically Signed   By: Valetta Mole M.D.   On: 09/02/2021 08:12   DG CHEST PORT 1 VIEW  Result Date: 09/02/2021 CLINICAL DATA:  Endotracheal tube placement. EXAM: PORTABLE CHEST 1 VIEW COMPARISON:  Chest x-ray dated August 07, 2021. FINDINGS: Endotracheal tube tip in good position 3.9 cm above the carina. Unchanged tunneled right internal jugular dialysis catheter. The heart size and mediastinal  contours are within normal limits. Normal pulmonary vascularity. Mild left lower lobe atelectasis. No focal consolidation, pleural effusion, or pneumothorax. No acute osseous abnormality. IMPRESSION: 1. Appropriately positioned endotracheal tube. No active disease. Electronically Signed   By: Titus Dubin M.D.   On: 09/03/2021 13:25   DG Abd Portable 1V  Result Date: 09/07/2021 CLINICAL DATA:  75 year old male status post orogastric tube placement. EXAM: PORTABLE ABDOMEN - 1 VIEW COMPARISON:  Abdominal radiograph 05/29/2021. FINDINGS: Enteric tube tip in the antral pre-pyloric region of the stomach. Multilevel nondilated loops of gas-filled small bowel are noted in the central abdomen. Gas and stool are also noted throughout the visualized portions of the colon. No pneumoperitoneum. IMPRESSION: 1. Tip of enteric tube is in the antral  pre-pyloric region of the stomach. 2. Nonspecific, nonobstructive bowel gas pattern. Electronically Signed   By: Vinnie Langton M.D.   On: 09/07/2021 07:11   EEG adult  Result Date: 09/10/2021 Lora Havens, MD     09/10/2021  8:36 PM Patient Name: Jonathan Kane MRN: [id number] Epilepsy Attending: Lora Havens Referring Physician/Provider: Dr Su Monks Date: 09/10/2021 Duration: 21.56 mins Patient history: 75yo M who initially presented with status epilepticus which resolved, now with sudden worsening of mental status. EEG to evaluate for seizure. Level of alertness:  comatose AEDs during EEG study: PHT, LEV Technical aspects: This EEG study was done with scalp electrodes positioned according to the 10-20 International system of electrode placement. Electrical activity was acquired at a sampling rate of 500Hz  and reviewed with a high frequency filter of 70Hz  and a low frequency filter of 1Hz . EEG data were recorded continuously and digitally stored. Description: EEG showed continuous generalized background suppression. Hyperventilation and photic stimulation were not performed.   ABNORMALITY - Background suppression, generalized IMPRESSION: This study is suggestive of profound diffuse encephalopathy, nonspecific etiology. No seizures or epileptiform discharges were seen throughout the recording. EEG appears to be worse compared to previous study. Dr Lorrin Goodell was notified. Priyanka [name]   Overnight EEG with video  Result Date: 09/18/2021 Lora Havens, MD     09-18-21  3:28 PM Patient Name: Jonathan Kane MRN: [id number] Epilepsy Attending: Lora Havens Referring Physician/Provider: Dr Su Monks Duration: 09/10/2021 2028 to 09-18-2021 1221  Patient history: 75yo M who initially presented with status epilepticus which resolved, now with sudden worsening of mental status. EEG to evaluate for seizure.  Level of alertness:  comatose  AEDs during EEG study: PHT, LEV  Technical  aspects: This EEG study was done with scalp electrodes positioned according to the 10-20 International system of electrode placement. Electrical activity was acquired at a sampling rate of 500Hz  and reviewed with a high frequency filter of 70Hz  and a low frequency filter of 1Hz . EEG data were recorded continuously and digitally stored.  Description: EEG showed continuous generalized background suppression. EEG was not reactive to tactile stimulation. Hyperventilation and photic stimulation were not performed.    ABNORMALITY - Background suppression, generalized  IMPRESSION: This study is suggestive of profound diffuse encephalopathy, nonspecific etiology. No seizures or epileptiform discharges were seen throughout the recording.  Priyanka [name]   Overnight EEG with video  Result Date: 09/02/2021 Lora Havens, MD     09/03/2021  5:14 AM Patient Name: Jonathan Kane MRN: [id number] Epilepsy Attending: Lora Havens Referring Physician/Provider: Dr Rosalin Hawking Duration: 08/28/2021 1659 to 09/02/2021 1659  Patient history:  75 y.o. 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 Krista Blue at [facility] presented to ED for forced left gaze and left facial twitching, mouth smacking, also has intermittent left UE jerking. RUE flexed with tone and intermittent right foot twitching. EEG to evaluate for seizure.  Level of alertness:  comatose  AEDs during EEG study: PHT, propofol  Technical aspects: This EEG study was done with scalp electrodes positioned according to the 10-20 International system of electrode placement. Electrical activity was acquired at a sampling rate of 500Hz  and reviewed with a high frequency filter of 70Hz  and a low frequency filter of 1Hz . EEG data were recorded continuously and digitally stored.  Description: EEG showed burst suppression pattern with bursts of  sharply contoured 5-9hz  theta-alpha activity lasting 3-4 seconds alternating with generalized eeg suppression lasting 3-6 seconds.  Lateralized periodic discharges with overriding fast activity ( LPD+) were also noted in right hemisphere, maximal right posterior quadrant with frequency fluctuating between every 1 to 5 seconds.  Hyperventilation and photic stimulation were not performed.    ABNORMALITY - Burst suppression, generalized -Lateralized periodic discharges with overriding fast activity, right hemisphere, maximal right posterior quadrant ( LPD+)  IMPRESSION: This study showed evidence of epileptogenicity arising from right hemisphere, maximal right posterior quadrant which is on the ictal-interictal continuum with high potential for seizure recurrence.  Additionally there is profound diffuse encephalopathy, nonspecific etiology but likely related to sedation. No definite seizures were seen throughout the recording.  Priyanka O Yadav   VAS [place] UPPER EXTREMITY ARTERIAL DUPLEX  Result Date: 08/24/2021  UPPER EXTREMITY DUPLEX STUDY Patient Name:  GEOFF DACANAY  Date of Exam:   08/24/2021 Medical Rec #: [id number]         Accession #:    [id number] Date of Birth: [birth year]          Patient Gender: M Patient Age:   71 years Exam Location:  [facility] Procedure:      VAS [place] UPPER EXTREMITY ARTERIAL DUPLEX Referring Phys: Monica Martinez --------------------------------------------------------------------------------  Indications: Evaluation prior to placement of dialysis access.  Risk Factors:  Hypertension, Diabetes, past history of smoking. Other Factors: Patient currently dialyzes via right subclavian catheter and has                a prior left radio - cephalic AVF Performing Technologist: Delorise Shiner RVT  Examination Guidelines: A complete evaluation includes B-mode imaging, spectral Doppler, color Doppler, and power Doppler as needed of all accessible portions of each vessel.  Bilateral testing is considered an integral part of a complete examination. Limited examinations for reoccurring indications may be performed as noted.  Right Pre-Dialysis Findings: +-----------------------+----------+--------------------+---------+--------+ Location               PSV (cm/s)Intralum. Diam. (cm)Waveform Comments +-----------------------+----------+--------------------+---------+--------+ Brachial Antecub. fossa58        0.50                biphasic          +-----------------------+----------+--------------------+---------+--------+ Radial Art at Wrist    58        0.27                triphasic         +-----------------------+----------+--------------------+---------+--------+ Ulnar Art at Wrist     60        0.20                triphasic         +-----------------------+----------+--------------------+---------+--------+  Left Pre-Dialysis Findings: +-----------------------+----------+--------------------+----------+----------+ Location               PSV (cm/s)Intralum. Diam. (cm)Waveform  Comments   +-----------------------+----------+--------------------+----------+----------+ Brachial Antecub. fossa84        0.63                monophasicPatent AVF +-----------------------+----------+--------------------+----------+----------+ Radial Art at Wrist    117       0.43                monophasicPatent AVF +-----------------------+----------+--------------------+----------+----------+ Ulnar Art at Wrist     79        0.22                monophasicPatent AVF +-----------------------+----------+--------------------+----------+----------+  Summary:  Right: No obstruction visualized in the right upper extremity. Left: No obstruction visualized in the left upper extremity. *See table(s) above for measurements and observations. Electronically signed by Monica Martinez MD on 08/24/2021 at 12:07:42 PM.    Final    CT HEAD CODE STROKE WO CONTRAST  Result  Date: 08/29/2021 CLINICAL DATA:  Code stroke.  Neuro deficit, acute stroke suspected EXAM: CT HEAD WITHOUT CONTRAST TECHNIQUE: Contiguous axial images were obtained from the base of the skull through the vertex without intravenous contrast. COMPARISON:  Brain MRI 08/08/2021, CT head 08/07/2021 FINDINGS: Brain: There is no evidence of acute intracranial hemorrhage, extra-axial fluid collection, or acute infarct. 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 The ventricles are stable in size. 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 There is no solid mass lesion.  There is no midline shift. Vascular: There is calcification of the bilateral cavernous ICAs. Skull: Normal. Negative for fracture or focal lesion. Sinuses/Orbits: The paranasal sinuses are clear. Bilateral lens implants are in place. The globes and orbits are otherwise unremarkable. Other: None. ASPECTS [facility] Stroke Program Early CT Score) - Ganglionic level infarction (caudate, lentiform nuclei, internal capsule, insula, M1-M3 cortex): 7 - Supraganglionic infarction (M4-M6 cortex): 3 Total score (0-10 with 10 being normal): 10 IMPRESSION: 1. No acute intracranial hemorrhage or infarct. 2. ASPECTS is 10. 3. 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 These results were paged via AMION at the time of interpretation on 09/05/2021 at 12:16 pm to provider Dr Erlinda Hong ts. Electronically Signed   By: Valetta Mole M.D.   On: 09/09/2021 12:17   VAS [place] UPPER EXTREMITY VEIN MAPPING  Result Date: 08/24/2021 UPPER EXTREMITY VEIN MAPPING Patient Name:  RANEN DOOLIN  Date of Exam:   08/24/2021 Medical Rec #: [id number]         Accession #:    [id number] Date  of Birth: 1946/03/06          Patient Gender: M Patient Age:   20 years Exam Location:  [facility] Procedure:      VAS [place] UPPER EXT VEIN MAPPING (PRE-OP AVF) Referring Phys: Monica Martinez --------------------------------------------------------------------------------  Indications: Evaluation prior to placement of dialysis access. History: Patient currently dialyzes via right subclavian catheter and has a          prior left radio - cephalic AVF.  Performing Technologist: Delorise Shiner RVT  Examination Guidelines: A complete evaluation includes B-mode imaging, spectral Doppler, color Doppler, and power Doppler as needed of all accessible portions of each  vessel. Bilateral testing is considered an integral part of a complete examination. Limited examinations for reoccurring indications may be performed as noted. +-----------------+-------------+----------+--------+ Right Cephalic   Diameter (cm)Depth (cm)Findings +-----------------+-------------+----------+--------+ Shoulder                         0.33            +-----------------+-------------+----------+--------+ Prox upper arm                   0.24            +-----------------+-------------+----------+--------+ Mid upper arm                    0.15            +-----------------+-------------+----------+--------+ Dist upper arm                   0.20            +-----------------+-------------+----------+--------+ Antecubital fossa                0.22            +-----------------+-------------+----------+--------+ Prox forearm                     0.19            +-----------------+-------------+----------+--------+ Mid forearm                      0.11            +-----------------+-------------+----------+--------+ Dist forearm                     0.11            +-----------------+-------------+----------+--------+ +-----------------+-------------+----------+--------+ Right Basilic     Diameter (cm)Depth (cm)Findings +-----------------+-------------+----------+--------+ Prox upper arm                   0.18            +-----------------+-------------+----------+--------+ Mid upper arm                    0.24            +-----------------+-------------+----------+--------+ Dist upper arm                   0.24            +-----------------+-------------+----------+--------+ Antecubital fossa                0.30            +-----------------+-------------+----------+--------+ Prox forearm                     0.14            +-----------------+-------------+----------+--------+ +-----------------+-------------+----------+----------+ Left Cephalic    Diameter (cm)Depth (cm) Findings  +-----------------+-------------+----------+----------+ Shoulder                         0.44              +-----------------+-------------+----------+----------+ Prox upper arm                   0.38              +-----------------+-------------+----------+----------+ Mid upper arm                    0.37              +-----------------+-------------+----------+----------+  Dist upper arm                   0.40              +-----------------+-------------+----------+----------+ Antecubital fossa                0.57              +-----------------+-------------+----------+----------+ Prox forearm                     0.12              +-----------------+-------------+----------+----------+ Mid forearm                      0.14              +-----------------+-------------+----------+----------+ Dist forearm                     0.32   Thrombosed +-----------------+-------------+----------+----------+ +-----------------+-------------+----------+--------+ Left Basilic     Diameter (cm)Depth (cm)Findings +-----------------+-------------+----------+--------+ Prox upper arm                   0.40             +-----------------+-------------+----------+--------+ Mid upper arm                    0.44            +-----------------+-------------+----------+--------+ Dist upper arm                   0.45            +-----------------+-------------+----------+--------+ Antecubital fossa                0.51            +-----------------+-------------+----------+--------+ Prox forearm                     0.40            +-----------------+-------------+----------+--------+ Left radio- cephalic AVF is patent with the wrist segment of the cephalic vein being thrombosed. Collaterals redirect pulsatile blood to the upper arm segment of the cephalic vein. Summary: Right: Patent cephalic and basilic veins with diameters as described        above. Left: Patent basilic vein with diameters as described above.       Cephalic vein is part of a still partially patent AVF with       thrombus seen in the distal forearm and widely patent upper       arm segment. *See table(s) above for measurements and observations.  Diagnosing physician: Monica Martinez MD Electronically signed by Monica Martinez MD on 08/24/2021 at 12:07:14 PM.    Final    [place] EKG SITE RITE  Result Date: 09/05/2021 If Site Rite image not attached, placement could not be confirmed due to current cardiac rhythm.  CT ANGIO HEAD NECK W WO CM W PERF (CODE STROKE)  Result Date: 09/07/2021 CLINICAL DATA:  Stroke, follow up EXAM: CT ANGIOGRAPHY HEAD AND NECK CT PERFUSION BRAIN TECHNIQUE: Multidetector CT imaging of the head and neck was performed using the standard protocol during bolus administration of intravenous contrast. Multiplanar CT image reconstructions and MIPs were obtained to evaluate the vascular anatomy. Carotid stenosis measurements (when applicable) are obtained utilizing NASCET criteria, using the distal internal carotid diameter as the denominator. Multiphase CT imaging of the brain was performed following IV bolus contrast  injection. Subsequent parametric perfusion maps were calculated using RAPID software. CONTRAST:  168mL OMNIPAQUE IOHEXOL 350 MG/ML SOLN COMPARISON:  None. FINDINGS: CTA NECK Motion artifact is present. Aortic arch: Great vessel origins are patent. Right carotid system: Patent. Eccentric mild mixed plaque along the proximal internal carotid without stenosis. Left carotid system: Patent.  No stenosis. Vertebral arteries: Patent and codominant.  No stenosis. Skeleton: Degenerative changes of the cervical spine. Bulky anterior osteophytes at C4-C7. Multilevel canal and foraminal narrowing. Other neck: No mass or adenopathy. Upper chest: Included upper lungs are clear. Review of the MIP images confirms the above findings CTA HEAD Anterior circulation: Intracranial internal carotid arteries are patent with mild calcified plaque. Anterior and middle cerebral arteries are patent. Posterior circulation: Intracranial vertebral arteries are patent. Basilar artery is patent. Major cerebellar artery origins are patent. Posterior cerebral arteries are patent. Venous sinuses: Patent as allowed by contrast bolus timing. Review of the MIP images confirms the above findings CT Brain Perfusion Findings: CBF (<30%) Volume: 51mL Perfusion (Tmax>6.0s) volume: 85mL Mismatch Volume: 19mL Infarction Location: Anterior left frontal lobe corresponding to chronic infarct on noncontrast study. IMPRESSION: No large vessel occlusion, hemodynamically significant stenosis, or evidence of dissection. 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 Electronically Signed   By: Macy Mis M.D.   On: 09/10/2021 12:46    Microbiology Recent Results (from the past 240 hour(s))  Culture, blood (Routine X 2) w Reflex to ID Panel     Status: None   Collection Time: 09/03/21 10:18 AM   Specimen: BLOOD RIGHT ARM  Result Value Ref Range Status   Specimen  Description BLOOD RIGHT ARM  Final   Special Requests   Final    BOTTLES DRAWN AEROBIC ONLY Blood Culture results may not be optimal due to an inadequate volume of blood received in culture bottles   Culture   Final    NO GROWTH 5 DAYS Performed at [facility], [place] [address], [place], [place] [address]    Report Status 09/08/2021 FINAL  Final  Culture, blood (Routine X 2) w Reflex to ID Panel     Status: None   Collection Time: 09/03/21 11:28 AM   Specimen: BLOOD  Result Value Ref Range Status   Specimen Description BLOOD BLOOD RIGHT HAND  Final   Special Requests   Final    BOTTLES DRAWN AEROBIC AND ANAEROBIC Blood Culture adequate volume   Culture   Final    NO GROWTH 5 DAYS Performed at [facility], [place] [address] [place], [place] [address]    Report Status 09/08/2021 FINAL  Final  Urine Culture     Status: None   Collection Time: 09/03/21  2:03 PM   Specimen: Urine, Catheterized  Result Value Ref Range Status   Specimen Description URINE, CATHETERIZED  Final   Special Requests NONE  Final   Culture   Final    NO GROWTH Performed at [facility], [place] [address], [place], [place] [address]    Report Status 09/04/2021 FINAL  Final  Culture, Respiratory w Gram Stain     Status: None   Collection Time: 09/04/21  8:00 PM   Specimen: SPU  Result Value Ref Range Status   Specimen Description SPUTUM  Final   Special Requests NONE  Final   Gram Stain   Final    FEW SQUAMOUS EPITHELIAL CELLS PRESENT MODERATE WBC SEEN MODERATE  GRAM NEGATIVE RODS MODERATE GRAM POSITIVE COCCI Performed at [facility], [place] [address] [place], [place] [address]    Culture ABUNDANT KLEBSIELLA PNEUMONIAE  Final   Report Status 09/07/2021 FINAL  Final   Organism ID, Bacteria KLEBSIELLA PNEUMONIAE  Final      Susceptibility   Klebsiella pneumoniae - MIC*    AMPICILLIN RESISTANT Resistant     CEFAZOLIN <=4 SENSITIVE Sensitive     CEFEPIME <=0.12 SENSITIVE Sensitive      CEFTAZIDIME <=1 SENSITIVE Sensitive     CEFTRIAXONE <=0.25 SENSITIVE Sensitive     CIPROFLOXACIN <=0.25 SENSITIVE Sensitive     GENTAMICIN <=1 SENSITIVE Sensitive     IMIPENEM <=0.25 SENSITIVE Sensitive     TRIMETH/SULFA <=20 SENSITIVE Sensitive     AMPICILLIN/SULBACTAM <=2 SENSITIVE Sensitive     PIP/TAZO <=4 SENSITIVE Sensitive     * ABUNDANT KLEBSIELLA PNEUMONIAE    Lab Basic Metabolic Panel: Recent Labs  Lab 09/07/21 0218 09/08/21 0236 09/09/21 0238 09/09/21 0748 09/10/21 0221 09/10/21 0556 2021/09/16 0317 2021-09-16 0929  NA 132* 132* 133*  --  131* 129*  --  134*  K 5.1 5.1 5.8* 6.3* 7.3* 6.5*  --  5.4*  CL 96* 96* 95*  --  93* 91*  --  96*  CO2 23 22 23   --  21* 20*  --  23  GLUCOSE 215* 203* 75  --  248* 299*  --  289*  BUN 41* 27* 46*  --  67* 70*  --  40*  CREATININE 6.53* 4.50* 6.42*  --  8.25* 8.42*  --  5.68*  CALCIUM 8.1* 8.3* 8.2*  --  8.2* 8.3*  --  8.4*  MG 2.6* 2.3 2.5*  --  2.8*  --  2.6*  --   PHOS 6.3*  --   --   --   --   --   --  8.6*   Liver Function Tests: Recent Labs  Lab 09/10/21 0221 September 16, 2021 0929  AST 51*  --   ALT 49*  --   ALKPHOS 97  --   BILITOT 0.6  --   PROT 6.9  --   ALBUMIN 2.7* 2.9*   No results for input(s): LIPASE, AMYLASE in the last 168 hours. No results for input(s): AMMONIA in the last 168 hours. CBC: Recent Labs  Lab 09/07/21 0218 09/08/21 0236 09/08/21 1815 09/09/21 0238 09/10/21 0556  WBC 10.3 7.0 10.0 9.2 8.8  HGB 10.2* 10.2* 10.0* 10.8* 10.1*  HCT 31.8* 30.9* 31.0* 33.0* 30.3*  MCV 96.4 96.6 95.7 94.8 95.0  PLT 181 85* 209 202 203   Cardiac Enzymes: No results for input(s): CKTOTAL, CKMB, CKMBINDEX, TROPONINI in the last 168 hours. Sepsis Labs: Recent Labs  Lab 09/08/21 0236 09/08/21 1815 09/09/21 0238 09/10/21 0556  WBC 7.0 10.0 9.2 8.8    Procedures/Operations  [facility]T Hemodialysis   Roselie Awkward 09/12/2021, 7:34 AM

## 2021-09-16 NOTE — Progress Notes (Signed)
Pt extubated to [facility] per order. RN at bedside, MD aware.

## 2021-09-16 NOTE — Progress Notes (Signed)
Time of Death 1413. Verified by Charge Nurse Kerrie Pleasure. Family in attendance.   No belongings in Room. Dentures with body-bag in pink hard-case.  [facility] specified by Rae Lips as [facility] service in [place] complete and Patient Placement notified  At [employer] bridge notified of time of Cardiac Death (Pt was referred approx 1300)  Remains to [facility] (dentures in Body bag)

## 2021-09-16 DEATH — deceased

## 2021-09-27 ENCOUNTER — Encounter (HOSPITAL_COMMUNITY): Payer: Medicare Other

## 2021-10-14 ENCOUNTER — Institutional Professional Consult (permissible substitution): Payer: Medicare Other | Admitting: Neurology

## 2021-11-16 ENCOUNTER — Ambulatory Visit: Payer: Medicare Other | Admitting: Neurology
